# Patient Record
Sex: Female | Born: 1940
Health system: Southern US, Community
[De-identification: ages and names within clinical notes are randomized; demographics above are authoritative.]

## PROBLEM LIST (undated history)

## (undated) DIAGNOSIS — R0600 Dyspnea, unspecified: Secondary | ICD-10-CM

## (undated) DIAGNOSIS — D649 Anemia, unspecified: Secondary | ICD-10-CM

## (undated) DIAGNOSIS — I480 Paroxysmal atrial fibrillation: Secondary | ICD-10-CM

## (undated) DIAGNOSIS — I10 Essential (primary) hypertension: Secondary | ICD-10-CM

## (undated) DIAGNOSIS — N189 Chronic kidney disease, unspecified: Secondary | ICD-10-CM

## (undated) DIAGNOSIS — F419 Anxiety disorder, unspecified: Secondary | ICD-10-CM

## (undated) DIAGNOSIS — I2510 Atherosclerotic heart disease of native coronary artery without angina pectoris: Secondary | ICD-10-CM

## (undated) DIAGNOSIS — F410 Panic disorder [episodic paroxysmal anxiety] without agoraphobia: Secondary | ICD-10-CM

## (undated) DIAGNOSIS — I059 Rheumatic mitral valve disease, unspecified: Secondary | ICD-10-CM

## (undated) DIAGNOSIS — M1990 Unspecified osteoarthritis, unspecified site: Secondary | ICD-10-CM

## (undated) DIAGNOSIS — Z87442 Personal history of urinary calculi: Secondary | ICD-10-CM

## (undated) DIAGNOSIS — I2699 Other pulmonary embolism without acute cor pulmonale: Secondary | ICD-10-CM

## (undated) DIAGNOSIS — R112 Nausea with vomiting, unspecified: Secondary | ICD-10-CM

## (undated) DIAGNOSIS — Z950 Presence of cardiac pacemaker: Secondary | ICD-10-CM

## (undated) DIAGNOSIS — Z9889 Other specified postprocedural states: Secondary | ICD-10-CM

## (undated) DIAGNOSIS — J189 Pneumonia, unspecified organism: Secondary | ICD-10-CM

## (undated) DIAGNOSIS — Z923 Personal history of irradiation: Secondary | ICD-10-CM

## (undated) DIAGNOSIS — F32A Depression, unspecified: Secondary | ICD-10-CM

## (undated) DIAGNOSIS — K219 Gastro-esophageal reflux disease without esophagitis: Secondary | ICD-10-CM

## (undated) DIAGNOSIS — E782 Mixed hyperlipidemia: Secondary | ICD-10-CM

## (undated) DIAGNOSIS — J449 Chronic obstructive pulmonary disease, unspecified: Secondary | ICD-10-CM

## (undated) DIAGNOSIS — G4730 Sleep apnea, unspecified: Secondary | ICD-10-CM

## (undated) HISTORY — DX: Gastro-esophageal reflux disease without esophagitis: K21.9

## (undated) HISTORY — DX: Paroxysmal atrial fibrillation: I48.0

## (undated) HISTORY — PX: BIOPSY BREAST: PRO8

## (undated) HISTORY — DX: Unspecified osteoarthritis, unspecified site: M19.90

## (undated) HISTORY — PX: COLONOSCOPY: SHX174

## (undated) HISTORY — PX: CARDIAC VALVE SURGERY: SHX40

## (undated) HISTORY — PX: MULTIPLE TOOTH EXTRACTIONS: SHX2053

## (undated) HISTORY — DX: Rheumatic mitral valve disease, unspecified: I05.9

## (undated) HISTORY — DX: Panic disorder (episodic paroxysmal anxiety): F41.0

## (undated) HISTORY — DX: Other pulmonary embolism without acute cor pulmonale: I26.99

## (undated) HISTORY — PX: EYE SURGERY: SHX253

## (undated) HISTORY — DX: Chronic obstructive pulmonary disease, unspecified: J44.9

## (undated) HISTORY — DX: Atherosclerotic heart disease of native coronary artery without angina pectoris: I25.10

## (undated) HISTORY — DX: Anemia, unspecified: D64.9

## (undated) HISTORY — DX: Mixed hyperlipidemia: E78.2

## (undated) HISTORY — PX: TUBAL LIGATION: SHX77

## (undated) HISTORY — DX: Anxiety disorder, unspecified: F41.9

## (undated) HISTORY — DX: Essential (primary) hypertension: I10

---

## 1999-06-13 ENCOUNTER — Other Ambulatory Visit: Admission: RE | Admit: 1999-06-13 | Discharge: 1999-06-13 | Payer: Self-pay | Admitting: Family Medicine

## 2000-08-12 ENCOUNTER — Encounter: Admission: RE | Admit: 2000-08-12 | Discharge: 2000-08-12 | Payer: Self-pay | Admitting: Family Medicine

## 2000-08-12 ENCOUNTER — Other Ambulatory Visit: Admission: RE | Admit: 2000-08-12 | Discharge: 2000-08-12 | Payer: Self-pay | Admitting: Family Medicine

## 2000-08-12 ENCOUNTER — Encounter: Payer: Self-pay | Admitting: Family Medicine

## 2001-02-19 HISTORY — PX: OTHER SURGICAL HISTORY: SHX169

## 2001-10-07 ENCOUNTER — Other Ambulatory Visit: Admission: RE | Admit: 2001-10-07 | Discharge: 2001-10-07 | Payer: Self-pay | Admitting: Family Medicine

## 2001-12-30 ENCOUNTER — Ambulatory Visit (HOSPITAL_BASED_OUTPATIENT_CLINIC_OR_DEPARTMENT_OTHER): Admission: RE | Admit: 2001-12-30 | Discharge: 2001-12-30 | Payer: Self-pay | Admitting: Orthopedic Surgery

## 2002-10-30 ENCOUNTER — Other Ambulatory Visit: Admission: RE | Admit: 2002-10-30 | Discharge: 2002-10-30 | Payer: Self-pay | Admitting: Family Medicine

## 2003-11-12 ENCOUNTER — Other Ambulatory Visit: Admission: RE | Admit: 2003-11-12 | Discharge: 2003-11-12 | Payer: Self-pay | Admitting: Family Medicine

## 2003-11-16 ENCOUNTER — Encounter: Admission: RE | Admit: 2003-11-16 | Discharge: 2004-02-14 | Payer: Self-pay | Admitting: Family Medicine

## 2004-02-22 ENCOUNTER — Encounter (INDEPENDENT_AMBULATORY_CARE_PROVIDER_SITE_OTHER): Payer: Self-pay | Admitting: Specialist

## 2004-02-22 ENCOUNTER — Ambulatory Visit (HOSPITAL_COMMUNITY): Admission: RE | Admit: 2004-02-22 | Discharge: 2004-02-22 | Payer: Self-pay | Admitting: Gastroenterology

## 2005-04-14 ENCOUNTER — Inpatient Hospital Stay (HOSPITAL_COMMUNITY): Admission: EM | Admit: 2005-04-14 | Discharge: 2005-04-17 | Payer: Self-pay | Admitting: *Deleted

## 2005-04-16 ENCOUNTER — Encounter (INDEPENDENT_AMBULATORY_CARE_PROVIDER_SITE_OTHER): Payer: Self-pay | Admitting: Interventional Cardiology

## 2005-10-05 ENCOUNTER — Other Ambulatory Visit: Admission: RE | Admit: 2005-10-05 | Discharge: 2005-10-05 | Payer: Self-pay | Admitting: Family Medicine

## 2006-10-09 ENCOUNTER — Encounter: Admission: RE | Admit: 2006-10-09 | Discharge: 2006-10-09 | Payer: Self-pay | Admitting: Family Medicine

## 2006-12-03 ENCOUNTER — Other Ambulatory Visit: Admission: RE | Admit: 2006-12-03 | Discharge: 2006-12-03 | Payer: Self-pay | Admitting: Family Medicine

## 2007-12-19 ENCOUNTER — Encounter (INDEPENDENT_AMBULATORY_CARE_PROVIDER_SITE_OTHER): Payer: Self-pay | Admitting: Internal Medicine

## 2007-12-20 ENCOUNTER — Inpatient Hospital Stay (HOSPITAL_COMMUNITY): Admission: EM | Admit: 2007-12-20 | Discharge: 2007-12-22 | Payer: Self-pay | Admitting: Emergency Medicine

## 2008-02-20 DIAGNOSIS — I2699 Other pulmonary embolism without acute cor pulmonale: Secondary | ICD-10-CM

## 2008-02-20 HISTORY — DX: Other pulmonary embolism without acute cor pulmonale: I26.99

## 2008-04-16 ENCOUNTER — Inpatient Hospital Stay (HOSPITAL_COMMUNITY): Admission: EM | Admit: 2008-04-16 | Discharge: 2008-04-17 | Payer: Self-pay | Admitting: Emergency Medicine

## 2008-08-25 ENCOUNTER — Encounter: Payer: Self-pay | Admitting: Internal Medicine

## 2008-08-27 ENCOUNTER — Encounter: Payer: Self-pay | Admitting: Internal Medicine

## 2008-09-02 ENCOUNTER — Encounter: Payer: Self-pay | Admitting: Internal Medicine

## 2008-10-21 ENCOUNTER — Encounter: Payer: Self-pay | Admitting: Internal Medicine

## 2008-10-22 ENCOUNTER — Encounter: Payer: Self-pay | Admitting: Internal Medicine

## 2008-11-12 DIAGNOSIS — K219 Gastro-esophageal reflux disease without esophagitis: Secondary | ICD-10-CM | POA: Insufficient documentation

## 2008-11-12 DIAGNOSIS — D649 Anemia, unspecified: Secondary | ICD-10-CM | POA: Insufficient documentation

## 2008-11-12 DIAGNOSIS — Z86718 Personal history of other venous thrombosis and embolism: Secondary | ICD-10-CM | POA: Insufficient documentation

## 2008-11-12 DIAGNOSIS — M1990 Unspecified osteoarthritis, unspecified site: Secondary | ICD-10-CM | POA: Insufficient documentation

## 2008-11-12 DIAGNOSIS — I4891 Unspecified atrial fibrillation: Secondary | ICD-10-CM | POA: Insufficient documentation

## 2008-11-12 DIAGNOSIS — F411 Generalized anxiety disorder: Secondary | ICD-10-CM | POA: Insufficient documentation

## 2008-11-12 DIAGNOSIS — F410 Panic disorder [episodic paroxysmal anxiety] without agoraphobia: Secondary | ICD-10-CM | POA: Insufficient documentation

## 2008-11-12 DIAGNOSIS — I10 Essential (primary) hypertension: Secondary | ICD-10-CM | POA: Insufficient documentation

## 2008-11-12 DIAGNOSIS — E119 Type 2 diabetes mellitus without complications: Secondary | ICD-10-CM | POA: Insufficient documentation

## 2008-11-12 DIAGNOSIS — I059 Rheumatic mitral valve disease, unspecified: Secondary | ICD-10-CM | POA: Insufficient documentation

## 2008-11-12 DIAGNOSIS — J449 Chronic obstructive pulmonary disease, unspecified: Secondary | ICD-10-CM | POA: Insufficient documentation

## 2008-11-12 DIAGNOSIS — I2510 Atherosclerotic heart disease of native coronary artery without angina pectoris: Secondary | ICD-10-CM | POA: Insufficient documentation

## 2008-11-12 DIAGNOSIS — E782 Mixed hyperlipidemia: Secondary | ICD-10-CM | POA: Insufficient documentation

## 2008-11-15 ENCOUNTER — Ambulatory Visit: Payer: Self-pay | Admitting: Internal Medicine

## 2008-11-16 DIAGNOSIS — I5022 Chronic systolic (congestive) heart failure: Secondary | ICD-10-CM | POA: Insufficient documentation

## 2008-11-19 ENCOUNTER — Encounter: Payer: Self-pay | Admitting: Internal Medicine

## 2008-11-19 ENCOUNTER — Ambulatory Visit (HOSPITAL_COMMUNITY): Admission: RE | Admit: 2008-11-19 | Discharge: 2008-11-19 | Payer: Self-pay | Admitting: Internal Medicine

## 2008-11-19 ENCOUNTER — Ambulatory Visit: Payer: Self-pay

## 2008-11-19 ENCOUNTER — Ambulatory Visit: Payer: Self-pay | Admitting: Internal Medicine

## 2009-01-24 ENCOUNTER — Ambulatory Visit: Payer: Self-pay | Admitting: Internal Medicine

## 2009-02-17 ENCOUNTER — Telehealth: Payer: Self-pay | Admitting: Internal Medicine

## 2009-02-28 ENCOUNTER — Telehealth: Payer: Self-pay | Admitting: Internal Medicine

## 2009-03-01 ENCOUNTER — Encounter: Payer: Self-pay | Admitting: Internal Medicine

## 2009-03-02 ENCOUNTER — Encounter: Payer: Self-pay | Admitting: Internal Medicine

## 2009-03-10 ENCOUNTER — Ambulatory Visit: Payer: Self-pay | Admitting: Internal Medicine

## 2009-03-16 LAB — CONVERTED CEMR LAB
CO2: 28 meq/L (ref 19–32)
Calcium: 9.7 mg/dL (ref 8.4–10.5)
Chloride: 100 meq/L (ref 96–112)
Creatinine, Ser: 0.8 mg/dL (ref 0.4–1.2)
Eosinophils Absolute: 0.2 10*3/uL (ref 0.0–0.7)
Eosinophils Relative: 2.6 % (ref 0.0–5.0)
GFR calc non Af Amer: 75.33 mL/min (ref >60)
HCT: 36.8 % (ref 36.0–46.0)
INR: 1.7 — ABNORMAL HIGH (ref 0.8–1.0)
MCHC: 32.6 g/dL (ref 30.0–36.0)
MCV: 90 fL (ref 78.0–100.0)
Monocytes Relative: 8.3 % (ref 3.0–12.0)
Platelets: 207 10*3/uL (ref 150.0–400.0)
Potassium: 4 meq/L (ref 3.5–5.1)
Prothrombin Time: 17.7 s — ABNORMAL HIGH (ref 9.1–11.7)
RDW: 13.1 % (ref 11.5–14.6)
Sodium: 137 meq/L (ref 135–145)

## 2009-03-17 ENCOUNTER — Ambulatory Visit: Payer: Self-pay | Admitting: Internal Medicine

## 2009-03-17 ENCOUNTER — Inpatient Hospital Stay (HOSPITAL_COMMUNITY): Admission: RE | Admit: 2009-03-17 | Discharge: 2009-03-18 | Payer: Self-pay | Admitting: Internal Medicine

## 2009-03-18 ENCOUNTER — Encounter: Payer: Self-pay | Admitting: Internal Medicine

## 2009-03-28 ENCOUNTER — Telehealth (INDEPENDENT_AMBULATORY_CARE_PROVIDER_SITE_OTHER): Payer: Self-pay | Admitting: *Deleted

## 2009-03-31 ENCOUNTER — Encounter: Payer: Self-pay | Admitting: Internal Medicine

## 2009-03-31 ENCOUNTER — Ambulatory Visit: Payer: Self-pay

## 2009-06-28 ENCOUNTER — Encounter: Payer: Self-pay | Admitting: Internal Medicine

## 2009-06-28 ENCOUNTER — Ambulatory Visit: Payer: Self-pay | Admitting: Internal Medicine

## 2009-08-24 ENCOUNTER — Telehealth: Payer: Self-pay | Admitting: Internal Medicine

## 2009-09-28 ENCOUNTER — Encounter: Payer: Self-pay | Admitting: Internal Medicine

## 2009-09-29 ENCOUNTER — Ambulatory Visit: Payer: Self-pay | Admitting: Internal Medicine

## 2009-10-03 ENCOUNTER — Telehealth: Payer: Self-pay | Admitting: Internal Medicine

## 2009-10-10 ENCOUNTER — Ambulatory Visit: Payer: Self-pay | Admitting: Internal Medicine

## 2009-10-10 DIAGNOSIS — R42 Dizziness and giddiness: Secondary | ICD-10-CM | POA: Insufficient documentation

## 2009-10-18 ENCOUNTER — Encounter: Admission: RE | Admit: 2009-10-18 | Discharge: 2009-10-18 | Payer: Self-pay | Admitting: Gastroenterology

## 2009-10-28 ENCOUNTER — Encounter: Payer: Self-pay | Admitting: Internal Medicine

## 2010-01-05 ENCOUNTER — Ambulatory Visit: Payer: Self-pay | Admitting: Internal Medicine

## 2010-02-02 ENCOUNTER — Encounter (INDEPENDENT_AMBULATORY_CARE_PROVIDER_SITE_OTHER): Payer: Self-pay | Admitting: *Deleted

## 2010-03-21 NOTE — Progress Notes (Signed)
Summary: set up ICD placement  0Phone Note Call from Patient Call back at Home Phone [phone]   Caller: Patient Reason for Call: Talk to Nurse, Talk to Doctor Summary of Call: pt calling to schedule her ICD placement Initial call taken by: Omer Jack,  February 28, 2009 3:24 PM  Follow-up for Phone Call        [facility] for pt to call back to sch Bi-V ICD Dennis Bast, RN, BSN  March 01, 2009 10:41 AM procedure scheduled for 03/17/09 at 7:30am labs on 03/10/09 at 3pm Dennis Bast, RN, BSN  March 02, 2009 3:59 PM

## 2010-03-21 NOTE — Miscellaneous (Signed)
Summary: Device preload  Clinical Lists Changes  Observations: Added new observation of ICDLEADSTAT3: active (03/18/2009 11:18) Added new observation of ICDLEADSER3: [id number] (03/18/2009 11:18) Added new observation of ICDLEADMOD3: 1258T (03/18/2009 11:18) Added new observation of ICDLEADLOC3: LV (03/18/2009 11:18) Added new observation of ICDLEADSTAT2: active (03/18/2009 11:18) Added new observation of ICDLEADSER2: [id number] (03/18/2009 11:18) Added new observation of ICDLEADMOD2: 7122  (03/18/2009 11:18) Added new observation of ICDLEADLOC2: RV  (03/18/2009 11:18) Added new observation of ICDLEADSTAT1: active  (03/18/2009 11:18) Added new observation of ICDLEADSER1: [id number]  (03/18/2009 11:18) Added new observation of ICDLEADMOD1: 2088TC  (03/18/2009 11:18) Added new observation of ICDLEADLOC1: RA  (03/18/2009 11:18) Added new observation of ICD IMP MD: Lewayne Bunting, MD  (03/18/2009 11:18) Added new observation of ICDLEADDOI3: 03/17/2009  (03/18/2009 11:18) Added new observation of ICDLEADDOI2: 03/17/2009  (03/18/2009 11:18) Added new observation of ICDLEADDOI1: 03/17/2009  (03/18/2009 11:18) Added new observation of ICD IMPL DTE: 03/17/2009  (03/18/2009 11:18) Added new observation of ICD SERL#: [id number]  (03/18/2009 11:18) Added new observation of ICD MODL#: [id number]  (03/18/2009 85:46) Added new observation of ICDMANUFACTR: [employer]  (03/18/2009 11:18) Added new observation of ICD MD: Lewayne Bunting, MD  (03/18/2009 11:18)       ICD Specifications Following MD:  Lewayne Bunting, MD     ICD Vendor:  [employer] Jude     ICD Model Number:  [id number]     ICD Serial Number:  [id number] ICD DOI:  03/17/2009     ICD Implanting MD:  Lewayne Bunting, MD  Lead 1:    Location: RA     DOI: 03/17/2009     Model #: [id number]     Serial #: [id number]     Status: active Lead 2:    Location: RV     DOI: 03/17/2009     Model #: [id number]     Serial #: [id number]     Status: active Lead 3:    Location: LV     DOI: 03/17/2009      Model #: 8242P     Serial #: [id number]     Status: active

## 2010-03-21 NOTE — Cardiovascular Report (Signed)
Summary: Office Visit   Office Visit   Imported By: Roderic Ovens 04/18/2009 13:10:01  _____________________________________________________________________  External Attachment:    Type:   Image     Comment:   External Document

## 2010-03-21 NOTE — Letter (Signed)
Summary: Physician Query Form  Physician Query Form   Imported By: Roderic Ovens 06/20/2009 16:48:47  _____________________________________________________________________  External Attachment:    Type:   Image     Comment:   External Document

## 2010-03-21 NOTE — Assessment & Plan Note (Signed)
Summary: [facility]   Primary Provider:  [name] MD   History of Present Illness: Emily Abbott returns today for followup.  She has a h/o symptomatic tachybrady, CHF, s/p MVR and is s/p BiV ICD secondary to all of the above and LBBB.  She denies c/p.  She has rare episodes of dyspnea.  She has had no intercurrent ICD therapies.  She does have some peripheral edema.  Current Medications (verified): 1)  Metformin Hcl 850 Mg Tabs (Metformin Hcl) .... One Tablet By Mouth Two Times A Day 2)  Aspirin Ec 325 Mg Tbec (Aspirin) .... Take One Tablet By Mouth Daily 3)  Carvedilol 12.5 Mg Tabs (Carvedilol) .... Take One Tablet By Mouth Twice A Day 4)  Tikosyn 250 Mcg Caps (Dofetilide) .... One Capsule By Mouth Two Times A Day 5)  Advair Diskus 100-50 Mcg/dose Aepb (Fluticasone-Salmeterol) .Marland Kitchen.. 1 Puff Two Times A Day 6)  Niaspan 750 Mg Cr-Tabs (Niacin (Antihyperlipidemic)) .... One Tablet Two Times A Day 7)  Prilosec 40 Mg Cpdr (Omeprazole) .... One Capsule By Mouth Once Daily 8)  Paroxetine Hcl 30 Mg Tabs (Paroxetine Hcl) .... One Tablet By Mouth Once Daily 9)  Simvastatin 40 Mg Tabs (Simvastatin) .... Take One Tablet By Mouth Daily At Bedtime 10)  Spiriva Handihaler 18 Mcg Caps (Tiotropium Bromide Monohydrate) .... Once Daily 11)  Warfarin Sodium 5 Mg Tabs (Warfarin Sodium) .... Use As Directed By Bertram Gala, Md 12)  Vicodin 5-500 Mg Tabs (Hydrocodone-Acetaminophen) .... As Needed 13)  Proair Hfa 108 (90 Base) Mcg/act Aers (Albuterol Sulfate) .... Uad As Needed 14)  Alprazolam 0.5 Mg Tabs (Alprazolam) .... One Tablet Two Times A Day As Needed For Anxiety 15)  Zolpidem Tartrate 10 Mg Tabs (Zolpidem Tartrate) .... One Tablet At Bed Time. 16)  Diphenhist 25 Mg Caps (Diphenhydramine Hcl) .... At Bedtime 17)  Tylenol Pm Extra Strength 500-25 Mg Tabs (Diphenhydramine-Apap (Sleep)) .... As Needed 18)  Simvastatin 20 Mg Tabs (Simvastatin) .... Take One Tablet By Mouth Daily At Bedtime 19)  Zyrtec  Hives Relief 10 Mg Tabs (Cetirizine Hcl) .... Once Daily  Allergies (verified): 1)  ! * Sulfa  Past History:  Past Medical History: Last updated: 11/12/2008 Current Problems:  PAROXYSMAL ATRIAL FIBRILLATION (ICD-427.31) CAD (ICD-414.00) ESSENTIAL HYPERTENSION (ICD-401.9) HYPERLIPIDEMIA, MIXED (ICD-272.2) DIABETES MELLITUS, TYPE II (ICD-250.00) MITRAL VALVE DISORDER (ICD-424.0) GERD (ICD-530.81) PULMONARY EMBOLISM, HX OF (ICD-V12.51) ANEMIA (ICD-285.9) COPD (ICD-496) OSTEOARTHRITIS (ICD-715.90) ANXIETY (ICD-300.00) PANIC ATTACK (ICD-300.01)  Past Surgical History: Last updated: 11/12/2008 BTL Breast BX Carpal tunnel 2003 Heart valve  Review of Systems  The patient denies chest pain, syncope, dyspnea on exertion, and peripheral edema.    Vital Signs:  Patient profile:   70 year old female Height:      66 inches Weight:      218 pounds BMI:     35.31 Pulse rate:   66 / minute BP sitting:   120 / 86  (left arm)  Vitals Entered By: Laurance Flatten CMA (Jun 28, 2009 2:42 PM)  Physical Exam  General:  Well developed, well nourished, in no acute distress. Head:  normocephalic and atraumatic Eyes:  PERRLA/EOM intact; conjunctiva and lids normal. Mouth:  Teeth, gums and palate normal. Oral mucosa normal. Neck:  Neck supple, no JVD. No masses, thyromegaly or abnormal cervical nodes. Chest Wall:  Healing median sternotomy incision. Well healed ICD incision. Lungs:  Clear bilaterally except for basilar rales in the left base. Heart:  RRR with normal S1 and S2.  PMI is  enlarged and laterally displaced.  No murmurs. Abdomen:  Bowel sounds positive; abdomen soft and non-tender without masses, organomegaly, or hernias noted. No hepatosplenomegaly. Msk:  Back normal, normal gait. Muscle strength and tone normal. Pulses:  pulses normal in all 4 extremities Extremities:  No clubbing or cyanosis. No peripheral edema. Neurologic:  Alert and oriented x 3.    ICD  Specifications Following MD:  Lewayne Bunting, MD     ICD Vendor:  [facility] Jude     ICD Model Number:  [id number]     ICD Serial Number:  [id number] ICD DOI:  03/17/2009     ICD Implanting MD:  Lewayne Bunting, MD  Lead 1:    Location: RA     DOI: 03/17/2009     Model #: [id number]     Serial #: [id number]     Status: active Lead 2:    Location: RV     DOI: 03/17/2009     Model #: [id number]     Serial #: [id number]     Status: active Lead 3:    Location: LV     DOI: 03/17/2009     Model #: 1258T     Serial #: [id number]     Status: active  ICD Follow Up Charge Time:  7.9 seconds     Battery Est. Longevity:  5.8-7.2 YRS Underlying rhythm:  SR ICD Dependent:  No       ICD Device Measurements Atrium:  Amplitude: 4.4 mV, Impedance: 390 ohms, Threshold: 0.75 V at 0.5 msec Right Ventricle:  Amplitude: 12.0 mV, Impedance: 480 ohms, Threshold: 0.625 V at 0.5 msec Left Ventricle:  Impedance: 860 ohms, Threshold: 0.5 V at 0.5 msec Shock Impedance: 61 ohms   Episodes MS Episodes:  43     Percent Mode Switch:  <1%     Coumadin:  No Shock:  0     ATP:  0     Nonsustained:  0     Atrial Therapies:  0 Atrial Pacing:  11%     Ventricular Pacing:  99%  Brady Parameters Mode DDD     Lower Rate Limit:  60     Upper Rate Limit 120 PAV 160     Sensed AV Delay:  110  Tachy Zones VF:  187     Next Cardiology Appt Due:  09/19/2009 Tech Comments:  NORMAL DEVICE FUNCTION. CHANGED ATRIAL AMPLITUDE FROM 3.5 TO 2.0V.  LONGEST MODE SWITCH 57 MINUTES ON 06-18-09. ROV IN CLINIC 3 [facility].  Vella Kohler  Jun 28, 2009 2:53 PM MD Comments:  Agree with above.  Impression & Recommendations:  Problem # 1:  CHRONIC SYSTOLIC HEART FAILURE (ICD-428.22) Her symptoms are class 2 and well controlled.  Continue current meds. Her updated medication list for this problem includes:    Aspirin Ec 325 Mg Tbec (Aspirin) .Marland Kitchen... Take one tablet by mouth daily    Carvedilol 12.5 Mg Tabs (Carvedilol) .Marland Kitchen... Take one tablet by mouth twice a day    Tikosyn 250 Mcg  Caps (Dofetilide) ..... One capsule by mouth two times a day    Warfarin Sodium 5 Mg Tabs (Warfarin sodium) ..... Use as directed by Bertram Gala, md  Problem # 2:  PAROXYSMAL ATRIAL FIBRILLATION (ICD-427.31) She has maintained NSR very nicely on tikosyn.  Continue current meds. Her updated medication list for this problem includes:    Aspirin Ec 325 Mg Tbec (Aspirin) .Marland Kitchen... Take one tablet by mouth daily    Carvedilol 12.5 Mg Tabs (Carvedilol) .Marland KitchenMarland KitchenMarland KitchenMarland Kitchen  Take one tablet by mouth twice a day    Tikosyn 250 Mcg Caps (Dofetilide) ..... One capsule by mouth two times a day    Warfarin Sodium 5 Mg Tabs (Warfarin sodium) ..... Use as directed by Bertram Gala, md  Problem # 3:  CAD (ICD-414.00) No anginal symptoms.   Her updated medication list for this problem includes:    Aspirin Ec 325 Mg Tbec (Aspirin) .Marland Kitchen... Take one tablet by mouth daily    Carvedilol 12.5 Mg Tabs (Carvedilol) .Marland Kitchen... Take one tablet by mouth twice a day    Warfarin Sodium 5 Mg Tabs (Warfarin sodium) ..... Use as directed by Bertram Gala, md  Patient Instructions: 1)  Your physician recommends that you schedule a follow-up appointment in: Jan.2012

## 2010-03-21 NOTE — Cardiovascular Report (Signed)
Summary: Office Visit   Office Visit   Imported By: Roderic Ovens 07/05/2009 11:57:07  _____________________________________________________________________  External Attachment:    Type:   Image     Comment:   External Document

## 2010-03-21 NOTE — Cardiovascular Report (Signed)
Summary: Office Visit Remote   Office Visit Remote   Imported By: Roderic Ovens 11/01/2009 10:05:30  _____________________________________________________________________  External Attachment:    Type:   Image     Comment:   External Document

## 2010-03-21 NOTE — Progress Notes (Signed)
Summary: PT REQUEST CALL--gt fyi  Phone Note Call from Patient Call back at [phone]   Caller: Patient Summary of Call: PT REQUEST CALL Initial call taken by: Judie Grieve,  October 03, 2009 4:22 PM  Follow-up for Phone Call        Dr Cliffton Asters wanted her to ler Dr Ladona Ridgel know that she reduced her Coreg by 1/2 and the dizziness is much better Dennis Bast, RN, BSN  October 03, 2009 4:49 PM

## 2010-03-21 NOTE — Letter (Signed)
Summary: Remote Device Check  [employer], Main Office  [address]   [place], [place] [address]   Phone: [phone]  Fax: [phone]     October 28, 2009 MRN: [id number]   [facility] [address] [place], [place]  [address]   Dear Ms. Schwartz,   Your remote transmission was recieved and reviewed by your physician.  All diagnostics were within normal limits for you.  __X___Your next transmission is scheduled for:   01-05-2010.  Please transmit at any time this day.  If you have a wireless device your transmission will be sent automatically.  Sincerely,  Vella Kohler

## 2010-03-21 NOTE — Progress Notes (Signed)
  Phone Note From Other Clinic   Caller: Annabelle Harman Details for Reason: Pt.Information Initial call taken by: Marijean Niemann Labs over to [facility] to fax [phone] [facility]  March 28, 2009 3:53 PM

## 2010-03-21 NOTE — Procedures (Signed)
Summary: [facility]   Current Medications (verified): 1)  Metformin Hcl 850 Mg Tabs (Metformin Hcl) .... One Tablet By Mouth Two Times A Day 2)  Aspirin Ec 325 Mg Tbec (Aspirin) .... Take One Tablet By Mouth Daily 3)  Carvedilol 12.5 Mg Tabs (Carvedilol) .... Take One Tablet By Mouth Twice A Day 4)  Tikosyn 250 Mcg Caps (Dofetilide) .... One Capsule By Mouth Two Times A Day 5)  Advair Diskus 100-50 Mcg/dose Aepb (Fluticasone-Salmeterol) .Marland Kitchen.. 1 Puff Two Times A Day 6)  Niaspan 750 Mg Cr-Tabs (Niacin (Antihyperlipidemic)) .... One Tablet Two Times A Day 7)  Prilosec 40 Mg Cpdr (Omeprazole) .... One Capsule By Mouth Once Daily 8)  Paroxetine Hcl 30 Mg Tabs (Paroxetine Hcl) .... One Tablet By Mouth Once Daily 9)  Simvastatin 40 Mg Tabs (Simvastatin) .... Take One Tablet By Mouth Daily At Bedtime 10)  Spiriva Handihaler 18 Mcg Caps (Tiotropium Bromide Monohydrate) .... Once Daily 11)  Warfarin Sodium 5 Mg Tabs (Warfarin Sodium) .... Use As Directed By Bertram Gala, Md 12)  Percocet 5-325 Mg Tabs (Oxycodone-Acetaminophen) .... As Needed/ Out 13)  Proair Hfa 108 [phone] Base) Mcg/act Aers (Albuterol Sulfate) .... Uad As Needed 14)  Alprazolam 0.5 Mg Tabs (Alprazolam) .... One Tablet Two Times A Day As Needed For Anxiety 15)  Zolpidem Tartrate 10 Mg Tabs (Zolpidem Tartrate) .... One Tablet At Bed Time. 16)  Diphenhist 25 Mg Caps (Diphenhydramine Hcl) .... As Needed 17)  Tylenol Pm Extra Strength 500-25 Mg Tabs (Diphenhydramine-Apap (Sleep)) .... As Needed 18)  Amoxicillin 500 Mg Caps (Amoxicillin) .... 2 By Mouth Two Times A Day  Allergies (verified): 1)  ! * Sulfa   ICD Specifications Following MD:  Lewayne Bunting, MD     ICD Vendor:  [employer]     ICD Model Number:  [id number]     ICD Serial Number:  [id number] ICD DOI:  03/17/2009     ICD Implanting MD:  Lewayne Bunting, MD  Lead 1:    Location: RA     DOI: 03/17/2009     Model #: [id number]     Serial #: [id number]     Status: active Lead 2:    Location: RV     DOI:  03/17/2009     Model #: [id number]     Serial #: [id number]     Status: active Lead 3:    Location: LV     DOI: 03/17/2009     Model #: [id number]     Serial #: [id number]     Status: active  ICD Follow Up Remote Check?  No Charge Time:  7.9 seconds     Battery Est. Longevity:  6.1 years Underlying rhythm:  SR ICD Dependent:  No       ICD Device Measurements Atrium:  Amplitude: 2.5 mV, Impedance: 430 ohms, Threshold: 0.75 V at 0.5 msec Right Ventricle:  Amplitude: 11.9 mV, Impedance: 480 ohms, Threshold: 0.5 V at 0.5 msec Left Ventricle:  Impedance: 850 ohms, Threshold: 0.5 V at 0.5 msec  Episodes MS Episodes:  0     Percent Mode Switch:  0     Coumadin:  No Shock:  0     ATP:  0     Nonsustained:  0     Atrial Pacing:  8.8%     Ventricular Pacing:  100%  Brady Parameters Mode DDD     Lower Rate Limit:  60     Upper Rate Limit 120 PAV 160  Sensed AV Delay:  110  Tachy Zones VF:  187     Next Cardiology Appt Due:  06/19/2009 Tech Comments:  Steri strips removed by the patient, no redness or edema.  Quick opt done, AV reprogrammed 160/110, LV>RV, interventricular pace delay .  Activity restrictions and post shock instructions reviewed with the patient.  She understands and is agreeable.  ROV 3 months with Dr. Ladona Ridgel. Altha Harm, LPN  March 31, 2009 12:42 PM

## 2010-03-21 NOTE — Letter (Signed)
Summary: Implantable Device Instructions  [employer], Main Office  [address]   [place], [place] [address]   Phone: [phone]  Fax: [phone]      Implantable Device Instructions  You are scheduled for:  Bi-V ICD Implant  on 03/17/09  with Dr. Ladona Ridgel.  1.  Please arrive at the Short Stay Center at [facility] at 6:00am on the day of your procedure.  2.  Do not eat or drink after midnight the night before your procedure.  3.  Complete lab work on 03/10/09 at 3pm.  The lab at [facility] is open from 8:30 AM to 1:30 PM and from 2:30 PM to 5:00 PM.    4.  Do NOT take these medications for the  day of  your procedure:  Metformin, Glimepiride  Take your last dose of Coumadin on --will call if you ned to hold  5.  Plan for an overnight stay.  Bring your insurance cards and a list of your medications.  6.  Wash your chest and neck with antibacterial soap (any brand) the evening before and the morning of your procedure.  Rinse well.   *If you have ANY questions after you get home, please call the office [phone]. Emily Abbott  *Every attempt is made to prevent procedures from being rescheduled.  Due to the nauture of Electrophysiology, rescheduling can happen.  The physician is always aware and directs the staff when this occurs.

## 2010-03-21 NOTE — Progress Notes (Signed)
Summary: phone checks  Phone Note Outgoing Call Call back at Home Phone [phone] Call back at [phone]   Caller: Patient Summary of Call: pt has a landline now and wants to do the phone checks. please call pt Initial call taken by: Edman Circle,  August 24, 2009 2:26 PM Summary of Call: spoke w/pt and aware that she has been enrolled in [facility].  Transmitter should arrive 1-2 wks.  pt aware to send transmission 09-29-2009. Vella Kohler  August 24, 2009 4:06 PM

## 2010-03-21 NOTE — Cardiovascular Report (Signed)
Summary: Pre Op Orders  Pre Op Orders   Imported By: Roderic Ovens 03/29/2009 12:32:58  _____________________________________________________________________  External Attachment:    Type:   Image     Comment:   External Document

## 2010-03-21 NOTE — Assessment & Plan Note (Signed)
Summary: bp low,lightheaded,dizzy   Visit Type:  rov Primary Provider:  [name] MD  CC:  pt states that Dr. [name] did orthostatics on her a few weeks ago....edema/ankles/abdomen....denies any cp or sob.  History of Present Illness: Ms. Perella returns today for followup.  She has a h/o symptomatic tachybrady, CHF, s/p MVR and is s/p BiV ICD secondary to all of the above and LBBB.  She denies c/p.  She was experiencing problems with near syncope and had her dose of coreg reduced.  The patient has been improved with fewer fluctuations in her pressure.  She has not had any intercurrent ICD therapies.  Despite medication adjustments she does still have spells of dizziness associated with upright posture.  Current Medications (verified): 1)  Metformin Hcl 850 Mg Tabs (Metformin Hcl) .... One Tablet By Mouth Two Times A Day 2)  Aspirin Ec 325 Mg Tbec (Aspirin) .... Take One Tablet By Mouth Daily 3)  Carvedilol 12.5 Mg Tabs (Carvedilol) .... 1/2 Tab Two Times A Day 4)  Tikosyn 250 Mcg Caps (Dofetilide) .... One Capsule By Mouth Two Times A Day 5)  Advair Diskus 100-50 Mcg/dose Aepb (Fluticasone-Salmeterol) .Marland Kitchen.. 1 Puff Two Times A Day 6)  Niaspan 750 Mg Cr-Tabs (Niacin (Antihyperlipidemic)) .... One Tablet Two Times A Day 7)  Prilosec 40 Mg Cpdr (Omeprazole) .... One Capsule By Mouth Once Daily 8)  Paroxetine Hcl 30 Mg Tabs (Paroxetine Hcl) .... One Tablet By Mouth Once Daily 9)  Simvastatin 40 Mg Tabs (Simvastatin) .... Take One Tablet By Mouth Daily At Bedtime 10)  Spiriva Handihaler 18 Mcg Caps (Tiotropium Bromide Monohydrate) .... Once Daily 11)  Warfarin Sodium 5 Mg Tabs (Warfarin Sodium) .... Use As Directed By Bertram Gala, Md 12)  Vicodin 5-500 Mg Tabs (Hydrocodone-Acetaminophen) .... As Needed 13)  Proair Hfa 108 (90 Base) Mcg/act Aers (Albuterol Sulfate) .... Uad As Needed 14)  Alprazolam 0.5 Mg Tabs (Alprazolam) .... One Tablet Two Times A Day As Needed For Anxiety 15)   Diphenhist 25 Mg Caps (Diphenhydramine Hcl) .... At Bedtime 16)  Tylenol Pm Extra Strength 500-25 Mg Tabs (Diphenhydramine-Apap (Sleep)) .... As Needed 17)  Zyrtec Hives Relief 10 Mg Tabs (Cetirizine Hcl) .... Once Daily 18)  Trazodone Hcl 50 Mg Tabs (Trazodone Hcl) .Marland Kitchen.. 1 Tab At Bedtime 19)  Vitamin D3 1000 Unit Tabs (Cholecalciferol) .Marland Kitchen.. 1 Tab Once Daily  Allergies: 1)  ! * Sulfa  Past History:  Past Medical History: Last updated: 11/12/2008 Current Problems:  PAROXYSMAL ATRIAL FIBRILLATION (ICD-427.31) CAD (ICD-414.00) ESSENTIAL HYPERTENSION (ICD-401.9) HYPERLIPIDEMIA, MIXED (ICD-272.2) DIABETES MELLITUS, TYPE II (ICD-250.00) MITRAL VALVE DISORDER (ICD-424.0) GERD (ICD-530.81) PULMONARY EMBOLISM, HX OF (ICD-V12.51) ANEMIA (ICD-285.9) COPD (ICD-496) OSTEOARTHRITIS (ICD-715.90) ANXIETY (ICD-300.00) PANIC ATTACK (ICD-300.01)  Past Surgical History: Last updated: 11/12/2008 BTL Breast BX Carpal tunnel 2003 Heart valve  Family History: Last updated: 11/15/2008 Non-contributory.  Social History: Last updated: 11/15/2008 Widowed, and a former smoker. Denies ETOH abuse.  Review of Systems  The patient denies chest pain, syncope, dyspnea on exertion, and peripheral edema.    Vital Signs:  Patient profile:   70 year old female Height:      66 inches Weight:      214.4 pounds BMI:     34.73 Pulse rate:   66 / minute Pulse (ortho):   75 / minute Pulse rhythm:   irregular BP sitting:   170 / 90  (left arm) BP standing:   119 / 75 Cuff size:   regular  Vitals Entered By:  Danielle Rankin, CMA (October 10, 2009 2:49 PM)  Serial Vital Signs/Assessments:  Time      Position  BP       Pulse  Resp  Temp     By 3:00 PM   Lying LA  170/90   66                    Danielle Rankin, CMA 3:00 PM   Sitting   157/85   [address], [place] 3:02 PM   Standing  119/75   75                    Danielle Rankin, [place] 3:04 PM   Standing  145/83   80                    Danielle Rankin, [place] 3:06 PM   Standing  145/83   73                    Danielle Rankin, [place]  Comments: 3:00 PM no sxms By: Danielle Rankin, CMA  3:00 PM no sxms By: Danielle Rankin, CMA  3:02 PM no sxms By: Danielle Rankin, CMA  3:04 PM headache By: Danielle Rankin, CMA  3:06 PM no sxms By: Danielle Rankin, CMA    Physical Exam  General:  Well developed, well nourished, in no acute distress. Head:  normocephalic and atraumatic Eyes:  PERRLA/EOM intact; conjunctiva and lids normal. Mouth:  Teeth, gums and palate normal. Oral mucosa normal. Neck:  Neck supple, no JVD. No masses, thyromegaly or abnormal cervical nodes. Chest Wall:  Healing median sternotomy incision. Well healed ICD incision. Lungs:  Clear bilaterally except for basilar rales in the left base. Heart:  RRR with normal S1 and S2.  PMI is enlarged and laterally displaced.  No murmurs. Abdomen:  Bowel sounds positive; abdomen soft and non-tender without masses, organomegaly, or hernias noted. No hepatosplenomegaly. Msk:  Back normal, normal gait. Muscle strength and tone normal. Pulses:  pulses normal in all 4 extremities Extremities:  No clubbing or cyanosis. No peripheral edema. Neurologic:  Alert and oriented x 3.    ICD Specifications Following MD:  Lewayne Bunting, MD     ICD Vendor:  [facility] [employer]     ICD Model Number:  [id number]     ICD Serial Number:  [id number] ICD DOI:  03/17/2009     ICD Implanting MD:  Lewayne Bunting, MD  Lead 1:    Location: RA     DOI: 03/17/2009     Model #: [id number]     Serial #: [id number]     Status: active Lead 2:    Location: RV     DOI: 03/17/2009     Model #: [id number]     Serial #: [id number]     Status: active Lead 3:    Location: LV     DOI: 03/17/2009     Model #: [id number]     Serial #: [id number]     Status: active  ICD Follow Up ICD Dependent:  No      Episodes Coumadin:  No  Brady Parameters Mode DDD     Lower Rate Limit:  60     Upper Rate Limit 120 PAV 160     Sensed AV Delay:  110  Tachy Zones VF:  187  Impression & Recommendations:  Problem # 1:  CHRONIC SYSTOLIC HEART FAILURE (ICD-428.22) Her symptoms are well controlled despite reducing her dose of coreg. Her updated medication list for this problem includes:    Aspirin Ec 325 Mg Tbec (Aspirin) .Marland Kitchen... Take one tablet by mouth daily    Carvedilol 12.5 Mg Tabs (Carvedilol) .Marland Kitchen... 1/2 tab two times a day    Tikosyn 250 Mcg Caps (Dofetilide) ..... One capsule by mouth two times a day    Warfarin Sodium 5 Mg Tabs (Warfarin sodium) ..... Use as directed by Bertram Gala, md  Problem # 2:  ORTHOSTATIC DIZZINESS (ICD-780.4) Her symptoms have improved with reducing of her meds.  Will follow up in several months. I would be inclined to allow her blood pressure to be a bit elevated in hopes of reducing her episodes of orthostasis.  Problem # 3:  PAROXYSMAL ATRIAL FIBRILLATION (ICD-427.31) Her symptoms are well controlled.  Continue meds as below. Her updated medication list for this problem includes:    Aspirin Ec 325 Mg Tbec (Aspirin) .Marland Kitchen... Take one tablet by mouth daily    Carvedilol 12.5 Mg Tabs (Carvedilol) .Marland Kitchen... 1/2 tab two times a day    Tikosyn 250 Mcg Caps (Dofetilide) ..... One capsule by mouth two times a day    Warfarin Sodium 5 Mg Tabs (Warfarin sodium) ..... Use as directed by Bertram Gala, md  Other Orders: EKG w/ Interpretation (93000)  Patient Instructions: 1)  Your physician wants you to follow-up in: Jan 2012   You will receive a reminder letter in the mail two months in advance. If you don't receive a letter, please call our office to schedule the follow-up appointment.

## 2010-03-23 NOTE — Cardiovascular Report (Signed)
Summary: Office Visit Remote   Office Visit Remote   Imported By: Roderic Ovens 02/03/2010 09:27:02  _____________________________________________________________________  External Attachment:    Type:   Image     Comment:   External Document

## 2010-03-23 NOTE — Letter (Signed)
Summary: Remote Device Check  [facility] [employer], Main Office  [address]   [place], [place] [address]   Phone: [phone]  Fax: [phone]     February 02, 2010 MRN: [id number]   [facility] [place] [address] [place], [place]  [address]   Dear Ms. Roel,   Your remote transmission was recieved and reviewed by your physician.  All diagnostics were within normal limits for you.  ___X__Your next transmission is scheduled for:04/13/2010.  Please transmit at any time this day.  If you have a wireless device your transmission will be sent automatically.  ______Your next office visit is scheduled for:                              . Please call our office to schedule an appointment.    Sincerely,  Altha Harm, LPN

## 2010-03-28 ENCOUNTER — Other Ambulatory Visit: Payer: Self-pay | Admitting: Family Medicine

## 2010-03-28 ENCOUNTER — Other Ambulatory Visit (HOSPITAL_COMMUNITY)
Admission: RE | Admit: 2010-03-28 | Discharge: 2010-03-28 | Disposition: A | Discharge status: Home / Self Care | Payer: Medicare Other | Source: Ambulatory Visit | Attending: Family Medicine | Admitting: Family Medicine

## 2010-03-28 DIAGNOSIS — Z0142 Encounter for cervical smear to confirm findings of recent normal smear following initial abnormal smear: Secondary | ICD-10-CM | POA: Insufficient documentation

## 2010-04-13 ENCOUNTER — Encounter (INDEPENDENT_AMBULATORY_CARE_PROVIDER_SITE_OTHER): Payer: Medicare Other

## 2010-04-13 DIAGNOSIS — I428 Other cardiomyopathies: Secondary | ICD-10-CM

## 2010-04-19 ENCOUNTER — Emergency Department (HOSPITAL_COMMUNITY): Payer: Medicare Other

## 2010-04-19 ENCOUNTER — Emergency Department (HOSPITAL_COMMUNITY)
Admission: EM | Admit: 2010-04-19 | Discharge: 2010-04-19 | Disposition: A | Discharge status: Home / Self Care | Payer: Medicare Other | Attending: Emergency Medicine | Admitting: Emergency Medicine

## 2010-04-19 ENCOUNTER — Encounter: Payer: Self-pay | Admitting: Internal Medicine

## 2010-04-19 DIAGNOSIS — R609 Edema, unspecified: Secondary | ICD-10-CM | POA: Insufficient documentation

## 2010-04-19 DIAGNOSIS — E789 Disorder of lipoprotein metabolism, unspecified: Secondary | ICD-10-CM | POA: Insufficient documentation

## 2010-04-19 DIAGNOSIS — Z950 Presence of cardiac pacemaker: Secondary | ICD-10-CM | POA: Insufficient documentation

## 2010-04-19 DIAGNOSIS — S93609A Unspecified sprain of unspecified foot, initial encounter: Secondary | ICD-10-CM | POA: Insufficient documentation

## 2010-04-19 DIAGNOSIS — M79609 Pain in unspecified limb: Secondary | ICD-10-CM | POA: Insufficient documentation

## 2010-04-19 DIAGNOSIS — I10 Essential (primary) hypertension: Secondary | ICD-10-CM | POA: Insufficient documentation

## 2010-04-19 DIAGNOSIS — Y9229 Other specified public building as the place of occurrence of the external cause: Secondary | ICD-10-CM | POA: Insufficient documentation

## 2010-04-19 DIAGNOSIS — M129 Arthropathy, unspecified: Secondary | ICD-10-CM | POA: Insufficient documentation

## 2010-04-19 DIAGNOSIS — Z79899 Other long term (current) drug therapy: Secondary | ICD-10-CM | POA: Insufficient documentation

## 2010-04-19 DIAGNOSIS — X503XXA Overexertion from repetitive movements, initial encounter: Secondary | ICD-10-CM | POA: Insufficient documentation

## 2010-04-19 DIAGNOSIS — E119 Type 2 diabetes mellitus without complications: Secondary | ICD-10-CM | POA: Insufficient documentation

## 2010-04-19 DIAGNOSIS — I4891 Unspecified atrial fibrillation: Secondary | ICD-10-CM | POA: Insufficient documentation

## 2010-04-19 DIAGNOSIS — F411 Generalized anxiety disorder: Secondary | ICD-10-CM | POA: Insufficient documentation

## 2010-04-19 LAB — DIFFERENTIAL
Lymphocytes Relative: 24 % (ref 12–46)
Lymphs Abs: 3.4 10*3/uL (ref 0.7–4.0)
Monocytes Absolute: 0.9 10*3/uL (ref 0.1–1.0)

## 2010-04-19 LAB — CBC
Hemoglobin: 12.2 g/dL (ref 12.0–15.0)
MCHC: 33.5 g/dL (ref 30.0–36.0)
MCV: 86.7 fL (ref 78.0–100.0)
Platelets: 198 10*3/uL (ref 150–400)
RDW: 14.1 % (ref 11.5–15.5)
WBC: 13.9 10*3/uL — ABNORMAL HIGH (ref 4.0–10.5)

## 2010-04-19 LAB — BASIC METABOLIC PANEL
CO2: 24 mEq/L (ref 19–32)
Calcium: 9.5 mg/dL (ref 8.4–10.5)
GFR calc Af Amer: >60 mL/min (ref >60)
GFR calc non Af Amer: >60 mL/min (ref >60)
Sodium: 132 mEq/L — ABNORMAL LOW (ref 135–145)

## 2010-04-19 LAB — PROTIME-INR: Prothrombin Time: 26.5 seconds — ABNORMAL HIGH (ref 11.6–15.2)

## 2010-05-07 LAB — GLUCOSE, CAPILLARY
Glucose-Capillary: 117 mg/dL — ABNORMAL HIGH (ref 70–99)
Glucose-Capillary: 139 mg/dL — ABNORMAL HIGH (ref 70–99)
Glucose-Capillary: 160 mg/dL — ABNORMAL HIGH (ref 70–99)

## 2010-05-07 LAB — PROTIME-INR: INR: 1.71 — ABNORMAL HIGH (ref 0.00–1.49)

## 2010-05-08 ENCOUNTER — Encounter: Payer: Self-pay | Admitting: *Deleted

## 2010-05-18 NOTE — Cardiovascular Report (Signed)
Summary: Office Visit   Office Visit   Imported By: Roderic Ovens 05/09/2010 15:45:19  _____________________________________________________________________  External Attachment:    Type:   Image     Comment:   External Document

## 2010-05-18 NOTE — Letter (Signed)
Summary: Remote Device Check  [facility], Main Office  [address]   [place], [place] [address]   Phone: [phone]  Fax: [phone]     May 08, 2010 MRN: [id number]   [facility] [address] [place], [place]  [address]   Dear Ms. Jaffe,   Your remote transmission was recieved and reviewed by your physician.  All diagnostics were within normal limits for you.  __X____Your next office visit is scheduled for:  May 2012 with Dr Ladona Ridgel. Please call our office to schedule an appointment.    Sincerely,  Vella Kohler

## 2010-06-06 LAB — POCT CARDIAC MARKERS
CKMB, poc: <1 ng/mL — ABNORMAL LOW (ref 1.0–8.0)
Myoglobin, poc: 65.1 ng/mL (ref 12–200)
Troponin i, poc: <0.05 ng/mL (ref 0.00–0.09)

## 2010-06-06 LAB — COMPREHENSIVE METABOLIC PANEL
Alkaline Phosphatase: 59 U/L (ref 39–117)
CO2: 23 mEq/L (ref 19–32)
Chloride: 102 mEq/L (ref 96–112)
GFR calc Af Amer: >60 mL/min (ref >60)
GFR calc non Af Amer: 58 mL/min — ABNORMAL LOW (ref >60)
Glucose, Bld: 158 mg/dL — ABNORMAL HIGH (ref 70–99)
Total Bilirubin: 0.7 mg/dL (ref 0.3–1.2)
Total Protein: 6.9 g/dL (ref 6.0–8.3)

## 2010-06-06 LAB — DIFFERENTIAL
Basophils Absolute: 0.1 K/uL (ref 0.0–0.1)
Basophils Relative: 1 % (ref 0–1)
Eosinophils Absolute: 0.4 K/uL (ref 0.0–0.7)
Eosinophils Relative: 3 % (ref 0–5)
Lymphocytes Relative: 20 % (ref 12–46)
Lymphs Abs: 2.4 10*3/uL (ref 0.7–4.0)
Monocytes Absolute: 0.6 10*3/uL (ref 0.1–1.0)
Monocytes Relative: 5 % (ref 3–12)
Neutro Abs: 8.7 K/uL — ABNORMAL HIGH (ref 1.7–7.7)
Neutrophils Relative %: 72 % (ref 43–77)

## 2010-06-06 LAB — TROPONIN I: Troponin I: 0.01 ng/mL (ref 0.00–0.06)

## 2010-06-06 LAB — CARDIAC PANEL(CRET KIN+CKTOT+MB+TROPI)
CK, MB: 1.8 ng/mL (ref 0.3–4.0)
CK, MB: 2.1 ng/mL (ref 0.3–4.0)
Relative Index: 2 (ref 0.0–2.5)
Troponin I: 0.01 ng/mL (ref 0.00–0.06)
Troponin I: <0.01 ng/mL (ref 0.00–0.06)

## 2010-06-06 LAB — CBC
HCT: 41.5 % (ref 36.0–46.0)
Hemoglobin: 14.6 g/dL (ref 12.0–15.0)
MCHC: 35.2 g/dL (ref 30.0–36.0)
MCV: 87.6 fL (ref 78.0–100.0)
Platelets: 225 10*3/uL (ref 150–400)
RBC: 4.74 MIL/uL (ref 3.87–5.11)
RDW: 13 % (ref 11.5–15.5)
WBC: 12.2 K/uL — ABNORMAL HIGH (ref 4.0–10.5)

## 2010-06-06 LAB — COMPREHENSIVE METABOLIC PANEL WITH GFR
ALT: 41 U/L — ABNORMAL HIGH (ref 0–35)
AST: 37 U/L (ref 0–37)
Albumin: 3.9 g/dL (ref 3.5–5.2)
BUN: 14 mg/dL (ref 6–23)
Calcium: 9.7 mg/dL (ref 8.4–10.5)
Creatinine, Ser: 0.96 mg/dL (ref 0.4–1.2)
Potassium: 3.9 meq/L (ref 3.5–5.1)
Sodium: 137 meq/L (ref 135–145)

## 2010-06-06 LAB — GLUCOSE, CAPILLARY
Glucose-Capillary: 124 mg/dL — ABNORMAL HIGH (ref 70–99)
Glucose-Capillary: 169 mg/dL — ABNORMAL HIGH (ref 70–99)
Glucose-Capillary: 248 mg/dL — ABNORMAL HIGH (ref 70–99)
Glucose-Capillary: 268 mg/dL — ABNORMAL HIGH (ref 70–99)

## 2010-06-06 LAB — CK TOTAL AND CKMB (NOT AT ARMC): Total CK: 78 U/L (ref 7–177)

## 2010-06-06 LAB — PROTIME-INR
INR: 1 (ref 0.00–1.49)
Prothrombin Time: 13.5 s (ref 11.6–15.2)

## 2010-07-03 ENCOUNTER — Encounter: Payer: Self-pay | Admitting: Internal Medicine

## 2010-07-04 NOTE — Discharge Summary (Signed)
Emily Abbott, Emily Abbott NO.:  [id number]   MEDICAL RECORD NO.:  [id number]          PATIENT TYPE:  INP   LOCATION:  2013                         FACILITY:  [facility]   PHYSICIAN:  Corinna L. Lendell Caprice, MDDATE OF BIRTH:  11-15-1940   DATE OF ADMISSION:  12/18/2007  DATE OF DISCHARGE:                               DISCHARGE SUMMARY   DISCHARGE DIAGNOSES:  1. Chest pain.  2. Paroxysmal atrial fibrillation.  3. Hyperlipidemia.  4. Anxiety disorder.  5. Hypertension.  6. Bradycardia.  7. Osteoporosis.  8. Diabetes.   DISCHARGE MEDICATIONS:  1. Decrease Toprol-XL to 25 mg a day.  2. Prilosec 20 mg a day.  3. Valium 2 mg daily as needed for panic attack.  4. Continue aspirin 325 mg a day.  5. Arixtra 60 mg a day.  6. Paxil 30 mg a day.  7. Simvastatin 40 mg a day.  8. Niaspan 650 mg nightly.  9. Hydrochlorothiazide 12.5 mg a day.  10.Glimepiride 2 mg a day.  11.Ambien 10 mg nightly.   FOLLOWUPGretta Arab Valentina Lucks, MD in 2-4 weeks.   ACTIVITY:  Ad lib.   CONSULTATIONS:  Dr. Amil Amen.   PROCEDURES:  Cardiac catheterization, which showed clean coronary  arteries, normal ejection fraction, normal right-sided pressures, cath  report dictation is pending.   DIET:  Low-cholesterol diabetic.   LABORATORY DATA:  Initial white count was 12,000, normal differential,  PT/PTT normal.  Complete metabolic panel significant for a glucose of  128, otherwise remarkable.  Serial cardiac enzymes are negative.  Hemoglobin A1c 6.7.  Total cholesterol 198, triglycerides 264, HDL 29,  LDL 116.  TSH 2.092.  Urinalysis negative for nitrites, trace leukocyte  esterase, 3-6 white cells, rare bacteria, 0-2 red cells.  EKG showed  sinus bradycardia with a rate of 51.  Chest x-ray showed nothing acute.   HISTORY AND HOSPITAL COURSE:  Emily Abbott is a 70 year old white female  with multiple cardiac risk factors, who presented with some  complications and chest pressure.  She has a reported  history of  paroxysmal atrial fibrillation and she takes Toprol-XL.  She was  admitted and ruled out for MI.  She had some bradycardia and Cardiology  was consulted.  They recommended decreasing her metoprolol, which was  done and she remains in normal sinus rhythm.  They also recommended  cardiac catheterization, which showed clean coronaries.  Echocardiogram  was done, but the computer is currently not functional.  It showed  normal ejection fraction.  Please see report.  The patient described a  lot of anxiety and panic and requested changing over to Valium.  I have  also placed her on empiric proton pump inhibitor, and she may follow up  with her primary care physician.      Corinna L. Lendell Caprice, MD  Electronically Signed     CLS/MEDQ  D:  12/22/2007  T:  12/23/2007  Job:  [id number]   cc:   Gretta Arab. Valentina Lucks, M.D.

## 2010-07-04 NOTE — Discharge Summary (Signed)
Emily Abbott, RAYMOND NO.:  [id number]   MEDICAL RECORD NO.:  [id number]          PATIENT TYPE:  INP   LOCATION:  2030                         FACILITY:  [facility]   PHYSICIAN:  Jake Bathe, MD      DATE OF BIRTH:  [birth year]   DATE OF ADMISSION:  04/16/2008  DATE OF DISCHARGE:  04/17/2008                               DISCHARGE SUMMARY   CARDIOLOGIST:  Francisca December, MD   PRIMARY CARE PHYSICIAN:  Gretta Arab. Valentina Lucks, MD   FINAL DIAGNOSES:  1. Paroxysmal atrial fibrillation - spontaneously converted with      assistance of diltiazem in emergency department.  2. New left bundle-branch block - cardiac biomarkers were all normal.      No chest pain, recent cardiac catheterization showed minor      irregularities throughout coronary tree.  3. Anxiety.  4. Hyperlipidemia.  5. Diabetes.  6. Hypertension.   BRIEF HOSPITAL COURSE:  A 70 year old female who was admitted with  palpitations duration of which was approximately 12 hours.  Once in the  emergency department she received diltiazem IV and shortly thereafter  spontaneously converted into sinus rhythm.  During her rapid atrial  fibrillation episode, she had newly discovered left bundle-branch block.  There was concern by the ED physician of acute coronary syndrome.  She  had no chest discomfort or shortness of breath.  She felt mild dizziness  with the atrial fibrillation.  However, no syncope.  She has had no  fevers, chills, nausea, vomiting, or diaphoresis.  Her ECG on the  morning of discharge demonstrated sinus rhythm with left bundle-branch  block, QRS duration of 150 milliseconds.  I do not have previous record  of left bundle-branch block morphology.   As I stated above, she recently had a cardiac catheterization a few  months back, which demonstrated minor irregularities throughout coronary  tree.  She also had a right heart catheterization, which demonstrated  normal RV pressures.   These episodes  of atrial fibrillation had been quite scattered, first  one was 2 years ago.  Last one was in October 2009 and then she  presented with this episode.  The one in October 2009 was also  confounded by an EKG, which demonstrated heart rate of 51.  Her Toprol-  XL had been decreased to 12.5 mg once a day.  Overnight, I placed her on  25 mg of Toprol-XL and diltiazem CD 120 mg once a day.  On the morning  of discharge, she felt well without any complaints, was ambulating well,  no dizziness.   PHYSICAL EXAMINATION:  VITAL SIGNS:  Blood pressure 100 to 111 over 72  with a heart rate in the 60s, sinus rhythm, respirations 20, temperature  was 97.7, sating 97% on room air.  GENERAL:  Alert and oriented x3 in no acute distress, mildly anxious.  CARDIOVASCULAR:  Regular rate and rhythm.  Split S2.  No murmurs, rubs,  or gallops.  LUNGS:  Clear to auscultation bilaterally.  ABDOMEN:  Soft, nontender, normoactive bowel sounds, and obese.  EXTREMITIES:  No clubbing, cyanosis,  or edema.   LABORATORY DATA:  Chest x-ray personally viewed showed no acute air  space disease.  Cardiac biomarkers are negative x3.  TSH was 3.3 normal.  Liver functions showed a mildly elevated ALT of 41 with upper limits of  normal being 35 - she is on Niaspan.  White count was also mildly  elevated at 12.2, hemoglobin 14.6, hematocrit 41.5, and platelets 225.  Glucose was also mildly elevated at 158, potassium was normal at 3.9,  creatinine was 0.96.   DISCHARGE MEDICATIONS:  1. New medication diltiazem CD 120 mg once a day.  2. Toprol-XL increased at 25 mg once a day.  3. Aspirin 325 mg once a day.  4. Valium 2 mg once a day p.r.n.  5. Evista 60 mg once a day.  6. Paxil 30 mg once a day.  7. Niaspan, I believe the dosage is 500 mg once a day.  8. Glimepiride 2 mg once a day.   I have stopped her hydrochlorothiazide secondary to lower blood pressure  in addition of diltiazem as above.   FOLLOWUP:  I will arrange  followup with Tillman Sers or Dr. Corliss Marcus  in 2 weeks' duration.  I have given her telephone number to contact our  office also.  With her new left bundle-branch block, it may not be  reasonable to repeat echocardiogram to ensure that there are no obvious  changes.  Reassuring recent cardiac catheterization.  Also with left  bundle-branch block and previous history of sinus bradycardia, I want  her to be careful especially in the light of the addition of very low-  dose diltiazem in addition to her Toprol.  If she does become  bradycardic or show other evidence of higher grade conduction disorder,  she may require pacemaker.  I discussed this with her briefly in the  emergency department.  She should also follow up with Maurice Small.   DISCHARGE TIME:  30 minutes.      Jake Bathe, MD  Electronically Signed     MCS/MEDQ  D:  04/17/2008  T:  04/17/2008  Job:  [id number]   cc:   Francisca December, M.D.  Gretta Arab Valentina Lucks, M.D.

## 2010-07-04 NOTE — H&P (Signed)
NAMEBHAVYA, ESCHETE NO.:  [id number]   MEDICAL RECORD NO.:  [id number]          PATIENT TYPE:  INP   LOCATION:  2030                         FACILITY:  [facility]   PHYSICIAN:  Jake Bathe, MD      DATE OF BIRTH:  [birth year]   DATE OF ADMISSION:  04/16/2008  DATE OF DISCHARGE:                              HISTORY & PHYSICAL   PRIMARY CARE PHYSICIAN:  Gretta Arab. Valentina Lucks, MD   CARDIOLOGIST:  Francisca December, MD   CHIEF COMPLAINT:  Rapid atrial fibrillation paroxysmal with new left  bundle-branch block.   HISTORY OF PRESENT ILLNESS:  A 70 year old female with diabetes,  hypertension, hyperlipidemia, paroxysmal atrial fibrillation, first  diagnosed 2 years ago then with subsequent admission 4 months ago with  anxiety who earlier last night was awakened with atrial fibrillation,  rapid ventricular response.  Throughout the day, she waited to see if  this would go away then finally called Dr. Marcy Siren office who  told her to go to the emergency department.  She did feel dizzy  throughout the day, but had no frank syncope.  She denied any chest  discomfort or radiation to her jaw like she had previously in October.  During that October admission, she did have a cardiac catheterization,  which demonstrated no evidence of significant coronary artery disease  and also she had a right heart catheterization at the time that showed  no RV or LV interdependence given prior thickened pericardium on  echocardiogram.  Her ejection fraction was normal.   As stated this rapid ventricular response atrial fibrillation began last  night, but was not associated with any shortness of breath, fevers,  syncope, or chest pain.  She now currently feels better now that she  spontaneously converted into sinus rhythm after minimal time on  diltiazem IV.   In her previous admission in October, she was noted to be slightly  bradycardic, and therefore her Toprol-XL was decreased from  50 mg down  to 25 mg.  She is currently taking 12.5 mg.  She did note that she had  had some sinus congestion recently and possible upper respiratory  infection.  She denies any heavy alcohol use or binge drinking.  Prior  thyroid evaluation was unremarkable.   PAST MEDICAL HISTORY:  As above - chest pain evaluated with cardiac  catheterization on December 22, 2007, which showed minor irregularities  throughout coronary tree.  Aortic pressure was 157/76 at that time.  Right-sided pressures were normal.  EF was 65%.   ALLERGIES:  SULFA.   MEDICATIONS:  1. Toprol-XL 2.5 mg once a day.  2. Prilosec.  3. Valium 2 mg for panic p.r.n.  4. Aspirin 325 mg a day.  5. Evista 60 mg a day.  6. Paxil 30 mg a day.  7. Simvastatin 40 mg at night.  8. Niaspan 500 mg at night.  9. Hydrochlorothiazide, she was taking 25 mg once a day.  10.Ambien 10 mg a day.  11.Glimepiride 2 mg a day.   SOCIAL HISTORY:  Denies any tobacco, occasionally has alcoholic  beverage, no illicit drug use.   FAMILY HISTORY:  Currently noncontributory, although she does have a  later family history of coronary artery disease.   PHYSICAL EXAMINATION:  VITAL SIGNS:  Temperature 97.9, pulse on arrival  122, respirations 20, sating 100% on room air, blood pressure originally  140/93 down to 91/54, and as low as 76/48.  GENERAL:  Alert and oriented x3, sitting comfortably in bed next to her  family member in no acute distress.  HEENT:  Eyes, well-perfused conjunctivae.  EOMI.  No scleral icterus.  NECK: Supple.  No carotid bruits.  No lymphadenopathy.  No thyromegaly  appreciated.  CARDIOVASCULAR:  Regular rate and rhythm, currently with no appreciable  murmurs, rubs, or gallops.  Normal PMI.  LUNGS:  Clear to auscultation bilaterally.  Normal respiratory effort.  No wheezes.  No rales.  ABDOMEN:  Mildly obese.  Positive bowel sounds.  No bruits.  Nontender.  No hepatosplenomegaly.  EXTREMITIES:  No clubbing, cyanosis,  or edema.  Normal distal pulses.  SKIN:  Warm, dry, and intact.  No rashes.  NEUROLOGIC:  Nonfocal.  No tremors.  PSYCHIATRIC:  Normal affect.  Slightly anxious.   DATA:  ECG personally viewed shows atrial fibrillation with left bundle-  branch block, which is new in appearance with a heart rate of 127.  Prior ECG on December 20, 2007, showed sinus bradycardia, rate 51 with no  evidence of left bundle-branch block morphology.   LABORATORY DATA:  PT/PTT were within normal limits.  Glucose was mildly  elevated at 158 with a BUN of 14, creatinine of 0.96, potassium of 3.9,  sodium of 137.  ALT was mildly elevated at 41 with an upper limit of  normal at 35.  Cardiac biomarker x1 is negative.   IMAGING:  Chest x-ray personally viewed shows no acute airspace disease.  Prior medical records reviewed.   ASSESSMENT/PLAN:  A 70 year old female with paroxysmal atrial  fibrillation, rapid ventricular response with spontaneous conversion  into sinus rhythm after diltiazem administration with new onset left  bundle-branch block, diabetes, hypertension, anxiety, hyperlipidemia.   PLAN:  Admit to telemetry.   1. Paroxysmal atrial fibrillation - at this point we will continue on      aspirin therapy.  She is currently in sinus rhythm.  She was in for      approximately 12 hour duration.  This is now the third episode she      has had in the past 2 years.  No clearly identifiable etiology.      Past echocardiogram showed left atrial size of 37 mm.  Normal      ejection fraction.  I will go ahead and give her Toprol 12.5 mg      once a day and in addition I will start her on diltiazem CD 120 mg      once a day at a very low dose.  She responded well to the diltiazem      administration earlier.  I am aware that her prior ECG in October      showed sinus bradycardia rate 51 and she has had problems with low      pulse at that time.  We did discuss briefly possible need for      pacemaker in the future  if we are unable to control her tachycardic      response during atrial fibrillation.  At this point, I will not      place her on any antiarrhythmic therapy  given the low frequency of      her atrial fibrillation events.  She does have a mild sinusitis,      which may have exacerbated this atrial fibrillation episode.  In      regards to her blood pressure, I will discontinue her      hydrochlorothiazide given the new administration of diltiazem 120      mg.  She also may be slightly dehydrated and she is currently      receiving 1 L of IV fluid from emergency department.  Also I am      going to check a TSH.  2. Left bundle-branch block, new onset - certainly could be rate      related given her atrial fibrillation.  There was concern by ED      physician of acute coronary syndrome.  She is currently without any      chest pain and is quite comfortable.  I doubt seriously that this      is acute coronary syndrome.  I will check cardiac biomarkers      overnight.  She did have recent reassuring cardiac evaluation with      minor irregularities noted throughout coronary tree.  Continue with      aggressive risk factor prevention.  3. Hyperlipidemia - continue Niaspan.  Prior lipid profile showed      triglycerides of 264, LDL of 116, HDL of 29, total cholesterol of      198.  This was performed in December 19, 2007.  4. Diabetes - last hemoglobin A1c was 6.7 in December 19, 2007, -      excellent control by Dr. Valentina Lucks.  We will continue glimepiride 2      mg once a day and I will also place on insulin sliding scale.      Monitor blood sugars frequently.  5. Anxiety - p.r.n. anxiolytic Valium 2 mg.   1. Deep venous thrombosis prophylaxis - we will place on Lovenox 40 mg      subcu.   DISPOSITION:  Hopefully cardiac biomarkers are reassuring, ECG  reassuring, and the patient is tolerating her low-dose diltiazem as well  as her low-dose Toprol.  She will be able to be discharged tomorrow  with  followup.      Jake Bathe, MD  Electronically Signed     MCS/MEDQ  D:  04/16/2008  T:  04/17/2008  Job:  [id number]   cc:   Gretta Arab. Valentina Lucks, M.D.  Francisca December, M.D.

## 2010-07-04 NOTE — H&P (Signed)
NAMEBRIGHTYN, MOZER NO.:  [id number]   MEDICAL RECORD NO.:  [id number]          PATIENT TYPE:  EMS   LOCATION:  MAJO                         FACILITY:  [facility]   PHYSICIAN:  Michiel Cowboy, MDDATE OF BIRTH:  1940/04/08   DATE OF ADMISSION:  12/18/2007  DATE OF DISCHARGE:                              HISTORY & PHYSICAL   PRIMARY CARE Edoardo Laforte:  Gretta Arab. Valentina Lucks, M.D.   CHIEF COMPLAINT:  A funny sensation in the chest.   Patient is a 70 year old female with a history of paroxysmal atrial  fibrillation with spontaneous resolution, history of hypertension,  dyslipidemia, and osteoporosis.  Patient for the past couple of weeks  has been having sensation/spells where she gets palpitations, sensation  of either chest pressure or just tightness that sometimes lasts between  a few minutes or 30 minutes at a time.  She feels like it is maybe  getting more frequent.  Today when she called EMS, the EMT on call told  her that she may have atrial fibrillation based on the pulse.  She does  have a history of this in the past and kind of felt similar in the past.  Currently, her symptoms are completely resolved, and she is in sinus  rhythm.  Otherwise, no fever, no chills.  She  recently had a cold and  had nausea and vomiting over the past few weeks but not for the past  week or so, otherwise review of systems unremarkable.  No melena, no  hematochezia, no bright red blood per rectum, no localized weakness, no  cough.  No dysuria.  Patient has been endorsing that she feels somewhat  swimmy-headed when she first stands up.   PAST MEDICAL HISTORY:  1. Hyperlipidemia.  2. Hypertension.  3. Paroxysmal atrial fibrillation, for which she was hospitalized two      years ago.  4. History of osteoporosis.  5. Diabetes.   ALLERGIES:  SULFA.   CURRENT MEDICATIONS:  1. Toprol XL 50 mg daily.  2. Aspirin 325 mg daily.  3. Evista 60 mg daily.  4. Paxil 30 mg daily.  5.  Xanax 0.5 mg as needed.  Patient hardly takes it once a week or so.  6. Simvastatin 20 mg p.o. daily.  7. Niaspan 650 mg daily.  8. Hydrochlorothiazide 12.5 mg p.o. daily.  9. Glimepiride 2 mg daily.  10.Ambien 10 mg at night.  11.Welchol 625 mg daily.   SOCIAL HISTORY:  Patient used to smoke but quit some time ago.  Lives at  home alone.  Does not abuse drugs.  Drinks occasionally but not on a  regular basis.   FAMILY HISTORY:  Significant for coronary disease, diabetes, and  strokes.   PHYSICAL EXAMINATION:  VITALS:  Temperature 98, blood pressure 133/74,  pulse 74, respirations 20, satting 97% on room air.  Patient appears to be currently in no acute distress.  Head atraumatic.  Somewhat dry mucous membranes.  LUNGS:  Clear to auscultation bilaterally.  HEART:  Regular rate and rhythm.  No murmurs, rubs or gallops.  ABDOMEN:  Soft, nontender, nondistended but  somewhat obese.  LOWER EXTREMITIES:  Without clubbing, cyanosis or edema.  Strength 5/5  in all four extremities.  Cranial nerves II-XII are intact.   LABS:  White blood cell count 12.3, hemoglobin 14.6, platelets 205.  Sodium 141, potassium 4, creatinine 1.2.  Cardiac enzymes within normal  limits.   EKG showing normal sinus rhythm.  Heart rate of 70.  No evidence of  ischemia or infarction.   Chest x-ray showing no cardiopulmonary disease.   ASSESSMENT/PLAN:  This is a 70 year old female with a history of  anxiety, depression, and paroxysmal atrial fibrillation in the past who  presents with atypical chest pressure/palpitations.  1. History of paroxysmal atrial fibrillation with now palpitations and      a chest pressure sensation which is transient.  I wonder if this is      her atrial fibrillation, but currently she is in sinus rhythm and      not symptomatic.  Will admit to telemetry.  Given risk factors,      will cycle cardiac enzymes, further risk stratify with fasting      lipid panel, hemoglobin A1C.  Will  check TSH.  Check 2D echo.  2. Patient may need outpatient, either Holter monitor or event      monitor.  3. Hyperlipidemia:  Continue home meds.  4. Hypertension:  Continue metoprolol but hold hydrochlorothiazide.      She appears to be clinically somewhat dehydrated.  5. Mild dehydration:  Check orthostatics.  Give IV fluids.  6. Depression:  Continue Paxil.  7. Anxiety:  Continue p.r.n. Xanax.  8. Prophylaxis:  Protonix plus Lovenox.      Michiel Cowboy, MD  Electronically Signed     AVD/MEDQ  D:  12/19/2007  T:  12/19/2007  Job:  [id number]   cc:   Gretta Arab. Valentina Lucks, M.D.

## 2010-07-04 NOTE — Consult Note (Signed)
NAMESHAVETTE, SHOAFF NO.:  [id number]   MEDICAL RECORD NO.:  [id number]          PATIENT TYPE:  OBV   LOCATION:  2013                         FACILITY:  [facility]   PHYSICIAN:  Francisca December, M.D.  DATE OF BIRTH:  [birth year]   DATE OF CONSULTATION:  12/20/2007  DATE OF DISCHARGE:                                 CONSULTATION   REASON FOR CONSULTATION:  Chest discomfort and tachycardia.   HISTORY OF PRESENT ILLNESS:  Emily Abbott is a pleasant 70 year old  woman with a prior history of atrial fibrillation not followed  chronically by Cardiology.  She endorses a history of worsening  exertional fatigue and dyspnea for the past 2 or 3 weeks.  She would try  to do minimum amount of yard work and become exhausted and it seemed to  be worsening on a daily basis.  She finally discontinued this activity  for approximately 1 week, at the same time what she thought were nasal  allergies.  This improved and she returned to the ER for additional work  1 day prior to admission on December 17, 2007.  She promptly had  recurrence of her symptoms of exertional fatigue, exhaustion, and  dyspnea.  She went inside to rest.  She had her heart rate and blood  pressure elevated.  She did take her blood pressure and it was 200/100.  She called her insurance company nurse who recommended a trip to the  emergency room and EMS was called.  In the emergency room, she was not  found to be hypertensive or tachycardiac.  I do not have any ECG strips  or ECG from the EMS transport.   She also relates a history of upper substernal pressure-like discomfort  radiating into the neck that is spontaneous and lasts anywhere from 4-6  minutes.  She has had multiple episodes of these.  They may happen  several times a day.  She has had some here in the hospital up to  yesterday.  They are not necessarily associated with her exertional  symptoms.  The only episode of clear tachy palpitation she had was  when  she was exerting herself on Thursday the day of admission.   PAST MEDICAL HISTORY:  1. Paroxysmal atrial fibrillation.  2. Hyperlipidemia.  3. Hypertension.  4. Osteoporosis.  5. Diabetes mellitus, on oral agent.  6. History of anxiety and panic attacks.  7. Remote history of cardiac catheterization 30% blockages in 1995.   MEDICATIONS ON ADMISSION:  1. Metoprolol ER at 50 mg p.o. daily.  2. Aspirin 325 mg p.o. daily.  3. Evista 60 mg p.o. daily.  4. Citalopram 30 mg p.o. daily.  5. Alprazolam 0.5 mg as needed b.i.d., takes approximately once a week      or so.  6. Simvastatin 20 mg p.o. daily.  7. Niaspan 650 mg p.o. daily.  8. Hydrochlorothiazide 12.5 mg p.o. daily.  9. Glimepiride 2 mg p.o. daily.  10.Ambien 10 mg p.o. nightly.  11.WelChol 625 mg four tablets daily.   DRUG ALLERGIES:  SULFA causes a rash.   SOCIAL HISTORY:  She is a remote smoker.  She lives at home alone.  Husband died in 55.  No alcohol use of significance.   FAMILY HISTORY:  Significant for coronary disease, but not in an early  age; diabetes; and stroke.   REVIEW OF SYSTEMS:  Negative except as mentioned above.   PHYSICAL EXAMINATION:  VITAL SIGNS:  Blood pressure is 117/47 and is  usually well controlled at Dr. Jone Baseman office, pulse is 58-70 in sinus  rhythm and regular.  She did have a 7-beat run of ventricular  tachycardia on the morning of December 20, 2007, at 6:30.  No symptoms  associated.  Temperature 97.7, O2 saturation 95% on room air.  GENERAL:  This is a pleasant well-appearing 70 year old mildly obese  Caucasian woman in no distress.  HEENT:  Unremarkable.  The head is atraumatic and normocephalic.  The  pupils are equal, round, and reactive to light.  Sclerae are anicteric.  Oral mucosa is pink and moist.  Teeth and gums in good repair.  Tongue  is not coated.  NECK:  Supple without thyromegaly or masses.  The carotid upstrokes are  normal.  There is no bruit.  There is no  JVD.  CHEST:  Clear with adequate excursion bilaterally.  No wheezes, rales,  or rhonchi.  HEART:  Regular rhythm.  Normal S1 and S2 is heard.  No S3, S4, murmur,  click, or rub noted.  ABDOMEN:  Obese, soft, and nontender.  No hepatosplenomegaly.  EXTERNAL GENITALIA:  Atrophic and normal.  RECTAL:  Not performed.  EXTREMITIES:  Full range of motion.  There are diminished pedal pulses  bilaterally.  No edema.  SKIN:  Free of lesions and warm, dry, and clear.  NEUROLOGIC:  Cranial nerves II through XII are intact.  Motor and  sensory grossly intact.  Gait not tested.   Electrocardiogram on admission:  Sinus rhythm, no significant findings.   Chest x-ray:  No active cardiopulmonary disease.   CK-MB and troponin negative x2.  White blood cell count 11000,  hemoglobin 12.4, platelets 187,000.  LDL cholesterol 160 and HDL 29.   A 2D echocardiogram has been completed and has shown intact LV systolic  function, EF 55%-60% without regional wall motion abnormality.  There is  mild aortic valvular sclerosis, mitral annular calcification, and mild  to moderate mitral regurgitation.  Doppler indices are indicative of  possible diastolic dysfunction, elevated left atrial pressure, and there  is a thickened pericardium by report.   IMPRESSION:  1. Atypical angina possibly in an unstable pattern.  2. Exertional fatigue, dyspnea, tachycardia, and hypertension.  3. History of panic attacks.  4. Multiple risk factors for coronary heart disease.  5. Intact left ventricular systolic function, mild to moderate mitral      vegetation, and thickened pericardium on echocardiogram suggestive      of possibility of constrictive pericarditis.   PLAN:  1. Review the 2D echocardiogram.  2. Continue telemetry monitoring, may need outpatient 24-hour      ambulatory monitor.  3. Discussed the options of further evaluation for coronary heart      disease.  The patient is willing, but reluctant at this time  to      proceed the catheterization.  We would prefer noninvasive      evaluation initially.  Therefore, we will obtain pharmacologic      stress.  I think she would be able to use a treadmill, although she      states anything that causes  her to have to walk rapidly or up the      hill induces symptoms rapidly, which she cannot ascertain are due      to panic attack versus something more organic.  4. If the thickened pericardium is confirmed with cardiac      catheterization, she will need right and left heart cath for      evaluation of possible RV/LV interaction.  As mentioned, she is      willing to proceed if necessary.  Another option would be to      further evaluate her pericardium with MRI.   Thank you very much for allowing me to assist in the care of Emily Abbott.  It has been a pleasure to do so.  I will discuss her further  care with you.      Francisca December, M.D.  Electronically Signed     JHE/MEDQ  D:  12/20/2007  T:  12/20/2007  Job:  [id number]   cc:   Gretta Arab. Valentina Lucks, M.D.

## 2010-07-06 ENCOUNTER — Ambulatory Visit (INDEPENDENT_AMBULATORY_CARE_PROVIDER_SITE_OTHER): Payer: Medicare Other | Admitting: Internal Medicine

## 2010-07-06 ENCOUNTER — Encounter: Payer: Self-pay | Admitting: Internal Medicine

## 2010-07-06 DIAGNOSIS — I5022 Chronic systolic (congestive) heart failure: Secondary | ICD-10-CM

## 2010-07-06 DIAGNOSIS — I4891 Unspecified atrial fibrillation: Secondary | ICD-10-CM

## 2010-07-06 DIAGNOSIS — I2510 Atherosclerotic heart disease of native coronary artery without angina pectoris: Secondary | ICD-10-CM

## 2010-07-06 DIAGNOSIS — I428 Other cardiomyopathies: Secondary | ICD-10-CM

## 2010-07-07 ENCOUNTER — Encounter: Payer: Self-pay | Admitting: Internal Medicine

## 2010-07-07 NOTE — H&P (Signed)
NAMELARON, ANGELINI NO.:  [id number]   MEDICAL RECORD NO.:  [id number]          PATIENT TYPE:  EMS   LOCATION:  MAJO                         FACILITY:  [facility]   PHYSICIAN:  Jackie Plum, M.D.DATE OF BIRTH:  1941/01/11   DATE OF ADMISSION:  04/14/2005  DATE OF DISCHARGE:                                HISTORY & PHYSICAL   PRIMARY CARE PHYSICIAN:  Dr. Maurice Small.   CHIEF COMPLAINT:  Left arm pain.   HISTORY OF PRESENT ILLNESS:  The patient is a 70 year old white female with  past medical history of hypertension and hyperlipidemia, who presented to  the emergency room after having episodes of left arm pain.  She previously  has been relatively good health and she tells me her blood pressure is well-  controlled.  She started having episodes of left arm aching.  She had no  episodes of chest pain or shortness of breath, but felt a little tightness  midsternally and became concerned and then she called the paramedics.  Paramedics noted by telemetry that the patient was in rapid atrial  fibrillation.  The patient came to the emergency room with a heart rate of  135 and a blood pressure of 149/104; the rest of her vitals were  unremarkable.  On arrival to the emergency room, her lab work was done,  which was essentially unremarkable as well.  The patient was given a 20-mg  dose of IV Cardizem and since that time, her heart rate has come down, even  down to the mid 50s, and her blood pressure dropped down to a systolic of  70s.  Since then, her blood pressure has improved to the one-teens and her  heart rate has started to climb up again.  Currently, the patient is doing  well.  She feels an occasional palpitation, but otherwise is doing well.  Her arm no longer hurts and she denies any headaches, visual changes,  dysphagia, chest pain.  No shortness of breath, wheeze, cough.  No abdominal  pain.  No hematuria, dysuria, constipation, diarrhea, focal  extremity  numbness, weakness or pain.  Her review of systems is otherwise negative.   PAST MEDICAL HISTORY:  1.  Hypertension.  2.  Hyperlipidemia.  3.  Depression.  4.  Osteoporosis.   MEDICATIONS:  1.  Crestor.  2.  Evista.  3.  Hydrochlorothiazide 12.5 mg p.o. daily.  4.  Paxil.  5.  Bisoprolol.   ALLERGIES:  SULFA.   SOCIAL HISTORY:  She denies any tobacco, alcohol or drug use.   FAMILY HISTORY:  Noncontributory.   PHYSICAL EXAMINATION:  VITALS ON ADMISSION:  Temperature 98.2, heart rate  135, now down to about 125, blood pressure initially 149/104, then down to  as low as 77/46, slightly improved to 112/72.  HEENT:  Normocephalic, atraumatic.  Her mucous membranes are moist.  NECK:  She has no carotid bruits.  HEART:  Irregular rhythm, slightly tachycardic.  LUNGS:  Clear to auscultation bilaterally.  ABDOMEN:  Soft, nontender and non-distended.  Positive bowel sounds.  EXTREMITIES:  No clubbing, cyanosis, or edema.  LABORATORY WORK:  White count 9.4, H&H 14 and 40.5, MCV of 86, platelet  count 216,000, no shift.  Sodium 141, potassium 4, chloride 105, bicarb 27,  BUN 16, creatinine 1, glucose 153.  LFTs notable only for an AST of 49, ALT  of 75.  Lipase level normal.  The rest of her lab work is unremarkable.  Cardiac enzymes:  First set -- CPK 63, MB 2.5, troponin I less than 0.5.   EKG just shows atrial fibrillation with some rapid ventricular rate.   ASSESSMENT AND PLAN:  1.  New-onset atrial fibrillation:  The patient responded significantly to      doses of intravenous Cardizem and made her hypotensive.  We will plan to      put the patient on oral Cardizem as well as Lovenox for anticoagulation.      In addition, we will check a 2-D echocardiogram and ask [facility]      for assistance.  2.  Left arm pain in the setting of new arrhythmia, questionable cardiac      ischemia:  We will check 2 more sets of cardiac enzymes and ask [facility]      Cardiology  for assistance.  The patient will likely need a stress test.  3.  Slightly elevated blood sugars:  We will check a hemoglobin A1c with      next blood draw to rule out any type of diabetic component.  4.  Hypertension:  Continue hydrochlorothiazide and start the patient on      Cardizem.  5.  Hyperlipidemia:  Continue the patient's Crestor.  6.  Depression:  Once we get the dose of her Paxil, we will continue this as      well.      Hollice Espy, M.D.  Electronically Signed      Jackie Plum, M.D.  Electronically Signed    SKK/MEDQ  D:  04/14/2005  T:  04/14/2005  Job:  [id number]   cc:   Gretta Arab. Valentina Lucks, M.D.  Fax: [phone]   [facility]

## 2010-07-07 NOTE — Progress Notes (Signed)
HPI Emily Abbott returns today for followup. She is a very pleasant 70 year old woman, with a history of atrial fibrillation, congestive heart failure, and coronary disease. She also has dyslipidemia. She is status post ICD implantation. She denies chest pain, shortness of breath, but has very mild peripheral edema. She has had no recent syncope and denies any recent ICD shocks. Allergies  Allergen Reactions  . Sulfonamide Derivatives      Current Outpatient Prescriptions  Medication Sig Dispense Refill  . albuterol (PROAIR HFA) 108 (90 BASE) MCG/ACT inhaler UAD as needed       . ALPRAZolam (XANAX) 0.5 MG tablet Take 0.5 mg by mouth 2 (two) times daily.        Marland Kitchen aspirin 325 MG tablet Take 325 mg by mouth daily.        . carvedilol (COREG) 6.25 MG tablet Take 6.25 mg by mouth 2 (two) times daily with a meal.        . cetirizine (ZYRTEC HIVES RELIEF) 10 MG tablet Take 10 mg by mouth daily.        . Cholecalciferol (VITAMIN D3) 1000 UNITS CAPS Take 1 tablet by mouth daily.        . diphenhydrAMINE (BENADRYL) 25 mg capsule Take 25 mg by mouth at bedtime.        . diphenhydramine-acetaminophen (TYLENOL PM EXTRA STRENGTH) 25-500 MG TABS Take by mouth at bedtime as needed.        . dofetilide (TIKOSYN) 250 MCG capsule Take 250 mcg by mouth 2 (two) times daily.        . Fluticasone-Salmeterol (ADVAIR DISKUS) 100-50 MCG/DOSE AEPB Inhale 1 puff into the lungs 2 (two) times daily.        Marland Kitchen HYDROcodone-acetaminophen (VICODIN) 5-500 MG per tablet Take 1 tablet by mouth as needed.        . metFORMIN (GLUCOPHAGE) 850 MG tablet Take 850 mg by mouth 2 (two) times daily with a meal.        . niacin (NIASPAN) 750 MG CR tablet Take 750 mg by mouth 2 (two) times daily.        Marland Kitchen omeprazole (PRILOSEC) 40 MG capsule Take 40 mg by mouth daily.        Marland Kitchen PARoxetine (PAXIL) 30 MG tablet Take 30 mg by mouth daily.        . simvastatin (ZOCOR) 40 MG tablet Take 40 mg by mouth at bedtime.        Marland Kitchen tiotropium (SPIRIVA) 18  MCG inhalation capsule Place 18 mcg into inhaler and inhale daily.        . traZODone (DESYREL) 50 MG tablet Take 50 mg by mouth at bedtime.        Marland Kitchen warfarin (COUMADIN) 5 MG tablet Use as directed by Bertram Gala, MD          Past Medical History  Diagnosis Date  . Paroxysmal atrial fibrillation   . CAD (coronary artery disease)   . HTN (hypertension)     essential  . Hyperlipidemia, mixed   . Diabetes mellitus     type II  . Mitral valve disorder   . GERD (gastroesophageal reflux disease)   . Pulmonary embolism   . Anemia   . COPD (chronic obstructive pulmonary disease)   . Osteoarthritis   . Anxiety   . Panic attack     ROS:   All systems reviewed and negative except as noted in the HPI.   Past Surgical History  Procedure Date  .  Btl   . Biopsy breast   . Carpal tennel 2003  . Cardiac valve surgery      No family history on file.   History   Social History  . Marital Status: Widowed    Spouse Name: N/A    Number of Children: N/A  . Years of Education: N/A   Occupational History  . Not on file.   Social History Main Topics  . Smoking status: Former Smoker    Types: Cigarettes    Quit date: 02/19/2001  . Smokeless tobacco: Not on file  . Alcohol Use: No  . Drug Use: Not on file  . Sexually Active: Not on file   Other Topics Concern  . Not on file   Social History Narrative  . No narrative on file     BP 150/80  Pulse 80  Ht 5\' 6"  (1.676 m)  Wt 216 lb (97.977 kg)  BMI 34.86 kg/m2  Physical Exam:  Well appearing NAD HEENT: Unremarkable Neck:  No JVD, no thyromegally Lymphatics:  No adenopathy Back:  No CVA tenderness Lungs:  Clear. Well-healed ICD incision. HEART:  Regular rate rhythm, no murmurs, no rubs, no clicks Abd:  Flat, positive bowel sounds, no organomegally, no rebound, no guarding Ext:  2 plus pulses, no edema, no cyanosis, no clubbing Skin:  No rashes no nodules Neuro:  CN II through XII intact, motor grossly  intact  DEVICE  Normal device function.  See PaceArt for details.   Assess/Plan:

## 2010-07-07 NOTE — Assessment & Plan Note (Signed)
She is well controlled on Tikosyn. She is in atrial fibrillation less than 1% of the time.

## 2010-07-07 NOTE — Assessment & Plan Note (Signed)
Her symptoms appear to be class II. She will continue a low-sodium diet and her current medications.

## 2010-07-07 NOTE — Op Note (Signed)
NAME:  Emily Abbott, SCHECTER                          ACCOUNT NO.:  [id number]   MEDICAL RECORD NO.:  [id number]                   PATIENT TYPE:  AMB   LOCATION:  DSC                                  FACILITY:  [facility]   PHYSICIAN:  Katy Fitch. Naaman Plummer., M.D.          DATE OF BIRTH:  02-10-1941   DATE OF PROCEDURE:  12/30/2001  DATE OF DISCHARGE:                                 OPERATIVE REPORT   PREOPERATIVE DIAGNOSIS:  Bilateral carpal tunnel syndrome.   POSTOPERATIVE DIAGNOSIS:  Bilateral carpal tunnel syndrome.   OPERATION PERFORMED:  1. Release of right transverse carpal ligament  (16109-UE).  2. Injection of left wrist ulnar bursa with Depo-Medrol and lidocaine ([id number]     LP).   SURGEON:  Katy Fitch. Sypher, M.D.   ASSISTANT:  Jonni Sanger, P.A.   ANESTHESIA:  General by LMA.   SUPERVISING ANESTHESIOLOGIST:  Maren Beach, M.D.   INDICATIONS FOR PROCEDURE:  The patient is a 70 year old woman referred by  her primary medical physician, Dr. Maurice Small for evaluation and  management of hand numbness and discomfort.  Clinical examination revealed  signs of probable bilateral carpal tunnel syndrome.  Electrodiagnostic  studies completed by [facility] had revealed unobtainable sensory  latencies on the right and significant prolongation of the motor and sensory  latencies on the left.   After informed consent she is advised to present for release of her right  transverse carpal ligament at this time and injection of the left ulnar  bursa in an effort to ameliorate her symptoms on that side pending surgery.   DESCRIPTION OF PROCEDURE:  The patient was brought to the operating room and  placed in supine position upon the operating table.  Following induction of  general anesthesia by LMA, the left arm was prepped with Betadine soap and  solution and sterilely draped.  Following exsanguination of the limb with an  Esmarch bandage, an arterial tourniquet at the  proximal brachium was  inflated to 220 mmHg.  The procedure commenced with a short incision in line  with the ring finger in the palm.  Subcutaneous tissues are carefully  divided from the palmar fascia.  This was split longitudinally with scissors  to reveal the common sensory branch of the median nerve and the superficial  palmar arch.  The common sensory branches were followed back to the median  nerve proper which was separated from the transverse carpal ligament.  The  ligament was released on its ulnar border extending to the distal forearm.  This widely opened the carpal canal.  No masses or other predicaments were  noted.  Bleeding points along the margin of the released ligament were  electrocauterized with bipolar current followed by repair of the skin with  intradermal 3-0 Prolene.  A Steri-Strips was applied.   Attention was then directed to the left wrist.  After prep of the skin with  alcohol,  the ulnar bursa was injected with 2 cc of a mixture of 0.4 cc of  Depo-Medrol 40 mg per ml and 1% plain lidocaine.  This was a satisfactory  injection.   The patient was placed in a compressive dressing on the right with a volar  splint maintaining the wrist in 5 degrees dorsiflexion.  She was awakened  from anesthesia and transferred to the recovery room with stable vital  signs.   For aftercare she was given a prescription for Darvocet-N 100 one or two  tablets p.o. q.4-6h. p.r.n. pain.  She will return to our office for follow-  up in 7 to 10 days to begin an exercise program.                                               Katy Fitch. Naaman Plummer., M.D.    RVS/MEDQ  D:  12/30/2001  T:  12/30/2001  Job:  [id number]   cc:   Gretta Arab. Valentina Lucks, M.D.  [address] [place]  [place] [address]  Fax: [phone]

## 2010-07-07 NOTE — Consult Note (Signed)
NAMEAINE, STRYCHARZ NO.:  [id number]   MEDICAL RECORD NO.:  [id number]          PATIENT TYPE:  INP   LOCATION:  3731                         FACILITY:  [facility]   PHYSICIAN:  Lyn Records, M.D.   DATE OF BIRTH:  1940-05-11   DATE OF CONSULTATION:  04/15/2005  DATE OF DISCHARGE:                                   CONSULTATION   CONCLUSION:  1.  Paroxysmal atrial fibrillation of unknown duration resolving      spontaneously after starting Cardizem on April 14, 2005.  2.  Chest discomfort during atrial fibrillation described as a mild      tightness intermittently.  Question angina due to coronary disease.      Question angina due to demand caused by increased rate.  Question      noncardiac chest pain.  3.  Throbbing left arm and left scapula discomfort of uncertain etiology.      Possibly coronary ischemic.  Possibly non-ischemic such as neuropathic      or musculoskeletal.  4.  History of hypertension.  5.  History of hyperlipidemia.  6.  Prior cardiac evaluation 1996 with essentially normal coronaries.   RECOMMENDATIONS:  1.  2-D Doppler echocardiogram to assess cardiac structure and function.  2.  Adenosine Cardiolite (patient states that she will not be able to walk      on a treadmill).  This will help to exclude significant underlying      coronary artery disease in this patient over the past 11 years since her      cardiac catheterization in 1996.  3.  Check a lipid profile.  4.  Consideration of Coumadin therapy depending upon findings from      echocardiogram.  At the very least the patient will need to be left on      an aspirin per day.  5.  Increase the dose of the patient's beta blocker therapy to hopefully      help suppress recurrences of atrial fibrillation.  She ordinarily takes      bisoprolol 5 mg per day.   COMMENTS:  The patient is 68 and was admitted to the hospital on April 14, 2005 with atrial fibrillation with rapid rate and  a two-week history of  fatigue.  A less than 24-hour history of aching left arm and scapular  discomfort.  A poorly characterized associated mid sternal chest tightness.  She was also having tachy palpitations on admission and states that  previously she had had this intermittently but it did not seem to last very  long.   ALLERGIES:  SULFA.   HABITS:  Discontinued smoking three years ago.  Denies ethanol consumption.   FAMILY HISTORY:  The patient's mother had a myocardial infarction in her  57s.  Several of her mother's siblings had vascular disease.  She has one  sister and one brother with coronary disease.   MEDICATIONS ON ADMISSION:  1.  Bisoprolol/HCTZ 5/6.25 mg per day.  2.  Paxil 15 mg per day.  3.  Evista 60 mg per day.  4.  Crestor  10 mg per day.  5.  HCTZ 12.5 mg per day.   PHYSICAL EXAMINATION:  GENERAL:  Patient is now lying comfortably in bed on  3700 in no acute distress.  VITAL SIGNS:  Heart rate 70, blood pressure 142/80.  SKIN:  Clear.  HEENT:  No JVD is noted.  No carotid bruits are heard.  LUNGS:  Clear to auscultation and percussion.  CARDIAC:  No rub.  No click.  No gallop.  No murmur.  ABDOMEN:  Bowel sounds are normal.  Abdomen is nontender.  EXTREMITIES:  No edema.   Initial EKG on April 14, 2005 demonstrated atrial fibrillation, rapid  ventricular response with ST-T abnormality inferolaterally at a rate of 134  beats per minute.  Post conversion EKGs demonstrate no acute changes and  essentially a normal-appearing tracing.  Chest x-ray is normal with the  exception of hyperinflation.   LABORATORY DATA:  Negative troponins and cardiac CK markers.  There is an  elevated hemoglobin A1c at 6.5.  Hemoglobin 14.  PT and PTT were not  performed.  Patient is receiving subcutaneous Lovenox currently.   DISCUSSION:  It is difficult to tell from the patient's history how long  atrial fibrillation had been present prior to admission to the hospital.  She  had not felt well for at least 10 days prior to admission complaining of  fatigue, weakness, and lightheadedness.  She became acutely aware of tachy  palpitations on Friday night and early Saturday morning when she was also  concerned about the discomfort in the left arm which prompted her visit to  the emergency room.  We need to exclude coronary disease.  She certainly has  not had evidence of myocardial infarction.  We also need to determine  whether or not our Coumadin therapy would be appropriate.  Not knowing the  duration of this episode of atrial fibrillation it is difficult to know what  we should do but certainly if there are structural abnormalities on the  echocardiogram I would lean towards Coumadin anticoagulation.      Lyn Records, M.D.  Electronically Signed     HWS/MEDQ  D:  04/15/2005  T:  04/15/2005  Job:  [id number]   cc:   Gretta Arab. Valentina Lucks, M.D.  Fax: [phone]

## 2010-07-07 NOTE — Op Note (Signed)
Emily Abbott, MOE NO.:  [id number]   MEDICAL RECORD NO.:  [id number]          PATIENT TYPE:  AMB   LOCATION:  ENDO                         FACILITY:  [facility]   PHYSICIAN:  Graylin Shiver, M.D.   DATE OF BIRTH:  16-Dec-1940   DATE OF PROCEDURE:  02/22/2004  DATE OF DISCHARGE:                                 OPERATIVE REPORT   PROCEDURE:  Colonoscopy with biopsy.   ENDOSCOPIST:  Graylin Shiver, M.D.   INDICATION FOR PROCEDURE:  Screening.   Informed consent was obtained after explanation of the risks of bleeding,  infection, and perforation.   PREMEDICATIONS:  Fentanyl 60 mcg IV, Versed 8 mg IV.   DESCRIPTION OF PROCEDURE:  With the patient in the left lateral decubitus  position, a rectal exam was performed.  No masses were felt.  The Olympus  colonoscope was inserted into the rectum and advanced around a tortuous  colon to the cecum.  Cecal landmarks were identified.  In the cecum, there  was a small 3-mm polyp biopsied off with cold forceps.  The ascending colon  looked normal.  At the level of the hepatic flexure, there was a small 3-mm  polyp biopsied off with cold forceps.  The transverse colon looked normal.  The descending colon looked normal.  In the sigmoid, there were three small  2-3 mm polyps biopsied off with cold forceps. The rectum looked normal.  She  tolerated the procedure well without complications.   IMPRESSION:  Several small colon polyps biopsied off, Diagnosis Code 211.3.   PLAN:  The pathology will be checked.       SFG/MEDQ  D:  02/22/2004  T:  02/22/2004  Job:  [id number]   cc:   Gretta Arab. Valentina Lucks, M.D.  [address] [place]  [place] [address]  Fax: [phone]

## 2010-07-07 NOTE — Assessment & Plan Note (Signed)
She denies anginal symptoms. She will continue her current medications. I've encouraged her increase her physical activity.

## 2010-07-07 NOTE — Discharge Summary (Signed)
[name], Emily Abbott NO.:  [id number]   MEDICAL RECORD NO.:  [id number]          PATIENT TYPE:  INP   LOCATION:  3731                         FACILITY:  [facility]   PHYSICIAN:  Jackie Plum, M.D.DATE OF BIRTH:  12-28-1940   DATE OF ADMISSION:  04/14/2005  DATE OF DISCHARGE:  04/17/2005                                 DISCHARGE SUMMARY   DISCHARGE DIAGNOSES:  1.  Paroxysmal atrial fibrillation, resolved spontaneously.  2.  History of hypertension.  3.  History of dyslipidemia.  4.  History of osteoporosis and depressive disorder.   DISCHARGE MEDICATIONS:  1.  Toprol XL 50 mg daily.  2.  Aspirin 325 mg daily.  3.  Crestor 10 mg daily.  4.  Evista 60 mg daily.  5.  Diltiazem 12.5 mg daily.  6.  Paxil 15 mg daily.  7.  Xanax 0.25 mg, as previously.  8.  Albuterol metered dose inhaler,2 puffs qid.  9.  Patient was asked not to take her bisoprolol/HCTZ.   She is to follow up with Dr. Valentina Lucks, her PCP in 1-2 weeks.  She was  instructed to follow up with her PCP here if she develops any chest pain,  arm pain, or any rapid heartbeat, and also shortness of breath.   CONSULTS:  [facility], Dr. Verdis Prime.   PROCEDURES:  Adenosine Cardiolite.   CONDITION ON DISCHARGE:  Improved, satisfactory.   Patient presented with new onset of atrial fibrillation with chest  discomfort and probable left arm and scapular pain.  See H&P as dictated by  Dr. Virginia Rochester dated April 14, 2005 for full details regarding the  patient's presentation.   On admission, the patient was admitted to a telemetry bed.  EKG shows atrial  fibrillation.  Her A fib resolved spontaneously.  She had treatment with  Cardizem.  The patient was seen by Dr. Verdis Prime, who recommended a 2D  echocardiogram and Adenosine Cardiolite.  Her Cardizem was discontinued, and  the patient was started on beta blockade.  The patient's symptoms resolved,  and her Adenosine Cardiolite showed no  reversible ischemia.  She had normal  left atrial size and normal left ventricular function.   The echocardiogram was grossly abnormal.  She was deemed not a candidate for  catheterization and was discharged home by my colleague, Dr. Derenda Mis,  on April 17, 2005 during rounds in stable, satisfactory condition.  The  patient was discharged home on aspirin and beta blocker, to follow up with  PCP, as listed above.      Jackie Plum, M.D.  Electronically Signed     GO/MEDQ  D:  06/27/2005  T:  06/28/2005  Job:  [id number]

## 2010-08-04 ENCOUNTER — Other Ambulatory Visit: Payer: Self-pay | Admitting: Internal Medicine

## 2010-08-04 MED ORDER — DOFETILIDE 250 MCG PO CAPS: 250.0000 ug | ORAL_CAPSULE | Freq: Two times a day (BID) | ORAL | Status: DC

## 2010-08-04 NOTE — Telephone Encounter (Signed)
Pt has only 1 pill left, said [facility], and called today, needs called in today and would like refill to last for a year

## 2010-10-05 ENCOUNTER — Ambulatory Visit (INDEPENDENT_AMBULATORY_CARE_PROVIDER_SITE_OTHER): Payer: Medicare Other | Admitting: *Deleted

## 2010-10-05 ENCOUNTER — Encounter: Payer: Self-pay | Admitting: Internal Medicine

## 2010-10-05 ENCOUNTER — Other Ambulatory Visit: Payer: Self-pay | Admitting: Internal Medicine

## 2010-10-05 DIAGNOSIS — I4891 Unspecified atrial fibrillation: Secondary | ICD-10-CM

## 2010-10-05 DIAGNOSIS — I428 Other cardiomyopathies: Secondary | ICD-10-CM

## 2010-10-05 LAB — REMOTE ICD DEVICE
AL IMPEDENCE ICD: 390 Ohm
ATRIAL PACING ICD: 6.7 pct
BAMS-0001: 150 {beats}/min
BAMS-0003: 80 {beats}/min
HV IMPEDENCE: 65 Ohm
LV LEAD IMPEDENCE ICD: 900 Ohm
LV LEAD THRESHOLD: 0.75 V
RV LEAD IMPEDENCE ICD: 480 Ohm

## 2010-10-13 NOTE — Progress Notes (Signed)
icd remote check  

## 2010-11-01 ENCOUNTER — Encounter: Payer: Self-pay | Admitting: *Deleted

## 2010-11-02 ENCOUNTER — Telehealth: Payer: Self-pay | Admitting: Internal Medicine

## 2010-11-02 NOTE — Telephone Encounter (Signed)
Pt calling to speak with Emily Abbott. Please return pt call.

## 2010-11-03 NOTE — Telephone Encounter (Signed)
Pt notified that we received transmission.

## 2010-11-05 ENCOUNTER — Inpatient Hospital Stay (INDEPENDENT_AMBULATORY_CARE_PROVIDER_SITE_OTHER)
Admission: RE | Admit: 2010-11-05 | Discharge: 2010-11-05 | Disposition: A | Discharge status: Home / Self Care | Payer: Medicare Other | Source: Ambulatory Visit | Attending: Family Medicine | Admitting: Family Medicine

## 2010-11-05 DIAGNOSIS — T148XXA Other injury of unspecified body region, initial encounter: Secondary | ICD-10-CM

## 2010-11-20 LAB — CBC
HCT: 35.8 — ABNORMAL LOW
Hemoglobin: 14
MCHC: 34.4
MCHC: 34.5
MCV: 88.4
Platelets: 187
RBC: 4.6
WBC: 12.3 — ABNORMAL HIGH

## 2010-11-20 LAB — URINALYSIS, ROUTINE W REFLEX MICROSCOPIC
Bilirubin Urine: NEGATIVE
Glucose, UA: NEGATIVE
Hgb urine dipstick: NEGATIVE
Ketones, ur: NEGATIVE
Nitrite: NEGATIVE
Protein, ur: NEGATIVE
Specific Gravity, Urine: 1.007
Urobilinogen, UA: 0.2
pH: 6.5

## 2010-11-20 LAB — COMPREHENSIVE METABOLIC PANEL
AST: 30
Albumin: 3.9
BUN: 11
Calcium: 9.3
Chloride: 106
Creatinine, Ser: 0.97
GFR calc Af Amer: >60
GFR calc non Af Amer: 57 — ABNORMAL LOW
Total Bilirubin: 0.8

## 2010-11-20 LAB — POCT I-STAT, CHEM 8
BUN: 13
Calcium, Ion: 1.23
Chloride: 105
Creatinine, Ser: 1.2
Glucose, Bld: 128 — ABNORMAL HIGH
HCT: 43
Hemoglobin: 14.6
Potassium: 4
Sodium: 141
TCO2: 28

## 2010-11-20 LAB — HEMOGLOBIN A1C
Hgb A1c MFr Bld: 6.7 — ABNORMAL HIGH
Mean Plasma Glucose: 146

## 2010-11-20 LAB — DIFFERENTIAL
Basophils Relative: 1
Lymphocytes Relative: 22
Lymphs Abs: 2.8
Monocytes Absolute: 0.7
Monocytes Relative: 6
Neutro Abs: 8.4 — ABNORMAL HIGH
Neutrophils Relative %: 69

## 2010-11-20 LAB — GLUCOSE, CAPILLARY
Glucose-Capillary: 113 — ABNORMAL HIGH
Glucose-Capillary: 115 — ABNORMAL HIGH
Glucose-Capillary: 120 — ABNORMAL HIGH
Glucose-Capillary: 152 — ABNORMAL HIGH
Glucose-Capillary: 161 — ABNORMAL HIGH
Glucose-Capillary: 165 — ABNORMAL HIGH
Glucose-Capillary: 168 — ABNORMAL HIGH
Glucose-Capillary: 196 — ABNORMAL HIGH
Glucose-Capillary: 95

## 2010-11-20 LAB — CARDIAC PANEL(CRET KIN+CKTOT+MB+TROPI)
Relative Index: 1.5
Total CK: 140
Troponin I: 0.01
Troponin I: 0.01

## 2010-11-20 LAB — POCT CARDIAC MARKERS
CKMB, poc: 2
CKMB, poc: 2.2
Myoglobin, poc: 110
Myoglobin, poc: 97.1
Troponin i, poc: <0.05
Troponin i, poc: <0.05

## 2010-11-20 LAB — LIPID PANEL
Cholesterol: 198
LDL Cholesterol: 116 — ABNORMAL HIGH

## 2010-11-20 LAB — URINE MICROSCOPIC-ADD ON

## 2010-11-20 LAB — CK TOTAL AND CKMB (NOT AT ARMC)
CK, MB: 2.4
Relative Index: 1.6
Total CK: 152

## 2010-11-20 LAB — URINE CULTURE
Colony Count: NO GROWTH
Culture: NO GROWTH

## 2010-11-20 LAB — TSH: TSH: 2.092

## 2010-11-20 LAB — APTT: aPTT: 29

## 2010-11-20 LAB — PROTIME-INR: INR: 1.1

## 2010-11-20 LAB — TROPONIN I: Troponin I: 0.03

## 2010-11-21 LAB — GLUCOSE, CAPILLARY
Glucose-Capillary: 119 — ABNORMAL HIGH
Glucose-Capillary: 128 — ABNORMAL HIGH
Glucose-Capillary: 135 — ABNORMAL HIGH
Glucose-Capillary: 138 — ABNORMAL HIGH
Glucose-Capillary: 147 — ABNORMAL HIGH
Glucose-Capillary: 151 — ABNORMAL HIGH
Glucose-Capillary: 180 — ABNORMAL HIGH

## 2010-11-21 LAB — POCT I-STAT 3, VENOUS BLOOD GAS (G3P V)
Acid-Base Excess: 1
Acid-Base Excess: 2
Bicarbonate: 25.4 — ABNORMAL HIGH
Bicarbonate: 25.9 — ABNORMAL HIGH
Bicarbonate: 26.8 — ABNORMAL HIGH
O2 Saturation: 55
O2 Saturation: 60
O2 Saturation: 63
TCO2: 27
TCO2: 27
TCO2: 28
pCO2, Ven: 42 — ABNORMAL LOW
pCO2, Ven: 42.4 — ABNORMAL LOW
pCO2, Ven: 43.1 — ABNORMAL LOW
pH, Ven: 7.39 — ABNORMAL HIGH
pH, Ven: 7.393 — ABNORMAL HIGH
pH, Ven: 7.401 — ABNORMAL HIGH
pO2, Ven: 29 — CL
pO2, Ven: 31
pO2, Ven: 33

## 2010-11-21 LAB — POCT I-STAT 3, ART BLOOD GAS (G3+)
Bicarbonate: 24.5 — ABNORMAL HIGH
O2 Saturation: 94
TCO2: 26
pCO2 arterial: 40.3
pH, Arterial: 7.393
pO2, Arterial: 71 — ABNORMAL LOW

## 2010-12-04 ENCOUNTER — Other Ambulatory Visit: Payer: Self-pay

## 2010-12-05 ENCOUNTER — Other Ambulatory Visit: Payer: Self-pay

## 2010-12-05 MED ORDER — DOFETILIDE 250 MCG PO CAPS: 250.0000 ug | ORAL_CAPSULE | Freq: Two times a day (BID) | ORAL | Status: DC

## 2011-01-04 ENCOUNTER — Other Ambulatory Visit: Payer: Self-pay | Admitting: Internal Medicine

## 2011-01-04 ENCOUNTER — Ambulatory Visit (INDEPENDENT_AMBULATORY_CARE_PROVIDER_SITE_OTHER): Payer: Medicare Other | Admitting: *Deleted

## 2011-01-04 ENCOUNTER — Encounter: Payer: Self-pay | Admitting: Internal Medicine

## 2011-01-04 DIAGNOSIS — I4891 Unspecified atrial fibrillation: Secondary | ICD-10-CM

## 2011-01-04 DIAGNOSIS — I428 Other cardiomyopathies: Secondary | ICD-10-CM

## 2011-01-04 LAB — REMOTE ICD DEVICE
AL IMPEDENCE ICD: 380 Ohm
BAMS-0001: 150 {beats}/min
DEVICE MODEL ICD: 752248
LV LEAD IMPEDENCE ICD: 900 Ohm
RV LEAD IMPEDENCE ICD: 490 Ohm
VENTRICULAR PACING ICD: 100 pct

## 2011-01-10 NOTE — Progress Notes (Signed)
Remote icd check  

## 2011-01-12 ENCOUNTER — Encounter: Payer: Self-pay | Admitting: *Deleted

## 2011-04-05 ENCOUNTER — Encounter: Payer: Self-pay | Admitting: Internal Medicine

## 2011-04-05 ENCOUNTER — Ambulatory Visit (INDEPENDENT_AMBULATORY_CARE_PROVIDER_SITE_OTHER): Payer: Medicare Other | Admitting: *Deleted

## 2011-04-05 DIAGNOSIS — I5022 Chronic systolic (congestive) heart failure: Secondary | ICD-10-CM

## 2011-04-05 DIAGNOSIS — I428 Other cardiomyopathies: Secondary | ICD-10-CM

## 2011-04-05 LAB — REMOTE ICD DEVICE
AL IMPEDENCE ICD: 390 Ohm
ATRIAL PACING ICD: 18 pct
BAMS-0001: 150 {beats}/min
BAMS-0003: 80 {beats}/min
BRDY-0002RA: 60 {beats}/min
DEVICE MODEL ICD: 752248
HV IMPEDENCE: 63 Ohm
LV LEAD IMPEDENCE ICD: 900 Ohm
RV LEAD IMPEDENCE ICD: 460 Ohm
VENTRICULAR PACING ICD: 100 pct

## 2011-04-13 NOTE — Progress Notes (Signed)
ICD remote with ICM 

## 2011-04-18 ENCOUNTER — Encounter: Payer: Self-pay | Admitting: *Deleted

## 2011-05-23 ENCOUNTER — Telehealth: Payer: Self-pay | Admitting: Internal Medicine

## 2011-05-23 NOTE — Telephone Encounter (Signed)
Having cataract surgery next Tuesday.  The eye doctor said its okay to stay on so that is what she is going to do

## 2011-05-23 NOTE — Telephone Encounter (Signed)
New msg Pt called and said she is having cataract surgery. She wants to know if she should come off of coumadin before surgery.

## 2011-07-10 ENCOUNTER — Ambulatory Visit (INDEPENDENT_AMBULATORY_CARE_PROVIDER_SITE_OTHER): Payer: Medicare Other | Admitting: Internal Medicine

## 2011-07-10 ENCOUNTER — Encounter: Payer: Self-pay | Admitting: Internal Medicine

## 2011-07-10 VITALS — BP 114/70 | HR 72 | Ht 66.5 in | Wt 218.1 lb

## 2011-07-10 DIAGNOSIS — Z95810 Presence of automatic (implantable) cardiac defibrillator: Secondary | ICD-10-CM | POA: Insufficient documentation

## 2011-07-10 DIAGNOSIS — I4891 Unspecified atrial fibrillation: Secondary | ICD-10-CM

## 2011-07-10 DIAGNOSIS — I428 Other cardiomyopathies: Secondary | ICD-10-CM

## 2011-07-10 DIAGNOSIS — I5022 Chronic systolic (congestive) heart failure: Secondary | ICD-10-CM

## 2011-07-10 LAB — ICD DEVICE OBSERVATION
AL AMPLITUDE: 3.5 mv
AL IMPEDENCE ICD: 380 Ohm
BAMS-0001: 150 {beats}/min
HV IMPEDENCE: 66 Ohm
LV LEAD IMPEDENCE ICD: 940 Ohm
LV LEAD THRESHOLD: 0.75 V
RV LEAD IMPEDENCE ICD: 460 Ohm
RV LEAD THRESHOLD: 0.5 V

## 2011-07-10 MED ORDER — DOFETILIDE 250 MCG PO CAPS: 250.0000 ug | ORAL_CAPSULE | Freq: Two times a day (BID) | ORAL | Status: DC

## 2011-07-10 NOTE — Patient Instructions (Signed)
Remote monitoring is used to monitor your Pacemaker of ICD from home. This monitoring reduces the number of office visits required to check your device to one time per year. It allows us to keep an eye on the functioning of your device to ensure it is working properly. You are scheduled for a device check from home on October 11, 2011. You may send your transmission at any time that day. If you have a wireless device, the transmission will be sent automatically. After your physician reviews your transmission, you will receive a postcard with your next transmission date.  Your physician wants you to follow-up in: 1 year with Dr Taylor.  You will receive a reminder letter in the mail two months in advance. If you don't receive a letter, please call our office to schedule the follow-up appointment.  

## 2011-07-10 NOTE — Assessment & Plan Note (Signed)
Her device is working normally. We'll plan to recheck in several months. The programming changes made today.

## 2011-07-10 NOTE — Assessment & Plan Note (Signed)
Her symptoms are class II. She will continue her current medical therapy. She'll maintain a low-sodium diet. 

## 2011-07-10 NOTE — Assessment & Plan Note (Signed)
She is maintaining sinus rhythm very nicely. She will continue her current medical therapy. 

## 2011-07-10 NOTE — Progress Notes (Signed)
HPI Emily Abbott returns today for followup. She is a very pleasant 72 year old woman with a nonischemic cardiomyopathy, chronic systolic heart failure, status post ICD implantation. She denies chest pain, shortness of breath, but does no peripheral edema at night which gets better than morning. No syncope or recent defibrillator shock. Allergies  Allergen Reactions  . Sulfonamide Derivatives      Current Outpatient Prescriptions  Medication Sig Dispense Refill  . albuterol (PROAIR HFA) 108 (90 BASE) MCG/ACT inhaler UAD as needed       . ALPRAZolam (XANAX) 0.5 MG tablet Take 0.5 mg by mouth 2 (two) times daily as needed.       Marland Kitchen aspirin 325 MG tablet Take 325 mg by mouth daily.        Marland Kitchen BROMDAY 0.09 % SOLN Place 1 drop into the right eye daily. Patient is holding eye drop now until next eye surgery in Aug.      . carvedilol (COREG) 6.25 MG tablet Take 6.25 mg by mouth 2 (two) times daily with a meal.        . cetirizine (ZYRTEC HIVES RELIEF) 10 MG tablet Take 10 mg by mouth as needed.       . Cholecalciferol (VITAMIN D3) 1000 UNITS CAPS Take 1 tablet by mouth daily.        . diphenhydrAMINE (BENADRYL) 25 mg capsule Take 25 mg by mouth at bedtime.        . diphenhydramine-acetaminophen (TYLENOL PM EXTRA STRENGTH) 25-500 MG TABS Take by mouth at bedtime as needed.        . dofetilide (TIKOSYN) 250 MCG capsule Take 1 capsule (250 mcg total) by mouth 2 (two) times daily.  60 capsule  11  . Fluticasone-Salmeterol (ADVAIR DISKUS) 100-50 MCG/DOSE AEPB Inhale 1 puff into the lungs 2 (two) times daily.        Marland Kitchen HYDROcodone-acetaminophen (VICODIN) 5-500 MG per tablet Take 1 tablet by mouth as needed.        . metFORMIN (GLUCOPHAGE) 850 MG tablet Take 850 mg by mouth 2 (two) times daily with a meal.        . niacin (NIASPAN) 750 MG CR tablet Take 750 mg by mouth 2 (two) times daily.        Marland Kitchen omeprazole (PRILOSEC) 40 MG capsule Take 40 mg by mouth daily.        . ONE TOUCH ULTRA TEST test strip       .  PARoxetine (PAXIL) 30 MG tablet Take 30 mg by mouth daily.        . prednisoLONE acetate (PRED FORTE) 1 % ophthalmic suspension Place 1 drop into the right eye daily. Patient will stop this eye drop when bottle it complete and will start retaking after next eye surgery in Aug.      . simvastatin (ZOCOR) 40 MG tablet Take 20 mg by mouth at bedtime.       Marland Kitchen tiotropium (SPIRIVA) 18 MCG inhalation capsule Place 18 mcg into inhaler and inhale as needed.       . warfarin (COUMADIN) 5 MG tablet Use as directed by Bertram Gala, MD       . zolpidem (AMBIEN) 10 MG tablet Take 5 mg by mouth at bedtime as needed.       Marland Kitchen DISCONTD: dofetilide (TIKOSYN) 250 MCG capsule Take 1 capsule (250 mcg total) by mouth 2 (two) times daily.  60 capsule  7     Past Medical History  Diagnosis Date  . Paroxysmal  atrial fibrillation   . CAD (coronary artery disease)   . HTN (hypertension)     essential  . Hyperlipidemia, mixed   . Diabetes mellitus     type II  . Mitral valve disorder   . GERD (gastroesophageal reflux disease)   . Pulmonary embolism   . Anemia   . COPD (chronic obstructive pulmonary disease)   . Osteoarthritis   . Anxiety   . Panic attack     ROS:   All systems reviewed and negative except as noted in the HPI.   Past Surgical History  Procedure Date  . Btl   . Biopsy breast   . Carpal tennel 2003  . Cardiac valve surgery      No family history on file.   History   Social History  . Marital Status: Widowed    Spouse Name: N/A    Number of Children: N/A  . Years of Education: N/A   Occupational History  . Not on file.   Social History Main Topics  . Smoking status: Former Smoker    Types: Cigarettes    Quit date: 02/19/2001  . Smokeless tobacco: Not on file  . Alcohol Use: No  . Drug Use: Not on file  . Sexually Active: Not on file   Other Topics Concern  . Not on file   Social History Narrative  . No narrative on file     BP 114/70  Pulse 72  Ht 5' 6.5"  (1.689 m)  Wt 218 lb 1.9 oz (98.939 kg)  BMI 34.68 kg/m2  Physical Exam:  Well appearing 71 year old woman, NAD HEENT: Unremarkable Neck:  No JVD, no thyromegally Lungs:  Clear with no wheezes, rales, or rhonchi. HEART:  Regular rate rhythm, no murmurs, no rubs, no clicks Abd:  soft, positive bowel sounds, no organomegally, no rebound, no guarding Ext:  2 plus pulses, no edema, no cyanosis, no clubbing Skin:  No rashes no nodules Neuro:  CN II through XII intact, motor grossly intact  DEVICE  Normal device function.  See PaceArt for details.   Assess/Plan:

## 2011-08-10 ENCOUNTER — Encounter: Payer: Self-pay | Admitting: Internal Medicine

## 2011-09-01 ENCOUNTER — Other Ambulatory Visit: Payer: Self-pay | Admitting: Internal Medicine

## 2011-10-11 ENCOUNTER — Encounter: Payer: Self-pay | Admitting: *Deleted

## 2011-10-11 ENCOUNTER — Encounter: Payer: Self-pay | Admitting: Internal Medicine

## 2011-10-11 ENCOUNTER — Ambulatory Visit (INDEPENDENT_AMBULATORY_CARE_PROVIDER_SITE_OTHER): Payer: Medicare Other | Admitting: *Deleted

## 2011-10-11 DIAGNOSIS — Z95810 Presence of automatic (implantable) cardiac defibrillator: Secondary | ICD-10-CM

## 2011-10-11 DIAGNOSIS — I5022 Chronic systolic (congestive) heart failure: Secondary | ICD-10-CM

## 2011-10-11 LAB — REMOTE ICD DEVICE
AL AMPLITUDE: 3.7 mv
AL IMPEDENCE ICD: 390 Ohm
BAMS-0001: 150 {beats}/min
BAMS-0003: 80 {beats}/min
DEVICE MODEL ICD: 752248
HV IMPEDENCE: 68 Ohm
LV LEAD IMPEDENCE ICD: 960 Ohm
MODE SWITCH EPISODES: 2
RV LEAD THRESHOLD: 0.5 V

## 2011-10-12 ENCOUNTER — Encounter: Payer: Self-pay | Admitting: *Deleted

## 2012-01-14 ENCOUNTER — Ambulatory Visit (INDEPENDENT_AMBULATORY_CARE_PROVIDER_SITE_OTHER): Payer: Medicare Other | Admitting: *Deleted

## 2012-01-14 ENCOUNTER — Encounter: Payer: Self-pay | Admitting: Internal Medicine

## 2012-01-14 DIAGNOSIS — I428 Other cardiomyopathies: Secondary | ICD-10-CM

## 2012-01-14 DIAGNOSIS — Z95810 Presence of automatic (implantable) cardiac defibrillator: Secondary | ICD-10-CM

## 2012-01-14 DIAGNOSIS — I5022 Chronic systolic (congestive) heart failure: Secondary | ICD-10-CM

## 2012-01-15 LAB — REMOTE ICD DEVICE
BAMS-0001: 150 {beats}/min
BAMS-0003: 80 {beats}/min
HV IMPEDENCE: 70 Ohm
LV LEAD IMPEDENCE ICD: 930 Ohm
RV LEAD IMPEDENCE ICD: 460 Ohm

## 2012-02-22 ENCOUNTER — Encounter: Payer: Self-pay | Admitting: *Deleted

## 2012-03-16 ENCOUNTER — Other Ambulatory Visit: Payer: Self-pay | Admitting: Internal Medicine

## 2012-04-01 ENCOUNTER — Telehealth: Payer: Self-pay | Admitting: Internal Medicine

## 2012-04-01 NOTE — Telephone Encounter (Signed)
Patient thinks she was shocked by her device.  She will send a remote transmission when she gets home for review.

## 2012-04-01 NOTE — Telephone Encounter (Signed)
New Problem:    Patient called in because her device has gone twice in the past couple of months, last time was 3-4 weeks ago according to the patient's recollection.  Patient would like to consult you to know how to proceed.  Please call back.

## 2012-04-12 ENCOUNTER — Other Ambulatory Visit: Payer: Self-pay | Admitting: Internal Medicine

## 2012-04-21 ENCOUNTER — Encounter: Payer: Self-pay | Admitting: Internal Medicine

## 2012-04-21 ENCOUNTER — Ambulatory Visit (INDEPENDENT_AMBULATORY_CARE_PROVIDER_SITE_OTHER): Payer: Medicare Other | Admitting: *Deleted

## 2012-04-21 ENCOUNTER — Other Ambulatory Visit: Payer: Self-pay | Admitting: Internal Medicine

## 2012-04-21 DIAGNOSIS — Z95810 Presence of automatic (implantable) cardiac defibrillator: Secondary | ICD-10-CM

## 2012-04-21 DIAGNOSIS — I5022 Chronic systolic (congestive) heart failure: Secondary | ICD-10-CM

## 2012-04-22 LAB — REMOTE ICD DEVICE
AL AMPLITUDE: 3.2 mv
BAMS-0003: 80 {beats}/min
DEVICE MODEL ICD: 752248
LV LEAD IMPEDENCE ICD: 930 Ohm
LV LEAD THRESHOLD: 0.75 V
RV LEAD THRESHOLD: 0.5 V

## 2012-04-30 ENCOUNTER — Encounter: Payer: Self-pay | Admitting: *Deleted

## 2012-06-19 ENCOUNTER — Encounter: Payer: Self-pay | Admitting: Cardiology

## 2012-06-19 ENCOUNTER — Ambulatory Visit (INDEPENDENT_AMBULATORY_CARE_PROVIDER_SITE_OTHER): Payer: Medicare Other | Admitting: Cardiology

## 2012-06-19 ENCOUNTER — Encounter: Payer: Self-pay | Admitting: Internal Medicine

## 2012-06-19 VITALS — BP 147/82 | HR 63 | Ht 66.5 in | Wt 224.1 lb

## 2012-06-19 DIAGNOSIS — I428 Other cardiomyopathies: Secondary | ICD-10-CM

## 2012-06-19 DIAGNOSIS — I4891 Unspecified atrial fibrillation: Secondary | ICD-10-CM

## 2012-06-19 DIAGNOSIS — Z95810 Presence of automatic (implantable) cardiac defibrillator: Secondary | ICD-10-CM

## 2012-06-19 DIAGNOSIS — I5022 Chronic systolic (congestive) heart failure: Secondary | ICD-10-CM

## 2012-06-19 MED ORDER — DOFETILIDE 250 MCG PO CAPS: 250.0000 ug | ORAL_CAPSULE | Freq: Two times a day (BID) | ORAL | Status: DC

## 2012-06-19 NOTE — Patient Instructions (Addendum)
Remote monitoring is used to monitor your Pacemaker of ICD from home. This monitoring reduces the number of office visits required to check your device to one time per year. It allows [place] to keep an eye on the functioning of your device to ensure it is working properly. You are scheduled for a device check from home on 09/19/2012. You may send your transmission at any time that day. If you have a wireless device, the transmission will be sent automatically. After your physician reviews your transmission, you will receive a postcard with your next transmission date.  Your physician wants you to follow-up in: 6 months with Dr. Ladona Ridgel. You will receive a reminder letter in the mail two months in advance. If you don't receive a letter, please call our office to schedule the follow-up appointment.  Your physician recommends that you continue on your current medications as directed. Please refer to the Current Medication list given to you today.

## 2012-06-22 ENCOUNTER — Encounter: Payer: Self-pay | Admitting: Cardiology

## 2012-06-22 LAB — ICD DEVICE OBSERVATION
AL AMPLITUDE: 3.5 mv
AL IMPEDENCE ICD: 400 Ohm
AL THRESHOLD: 0.75 V
BAMS-0001: 150 {beats}/min
HV IMPEDENCE: 65 Ohm
LV LEAD IMPEDENCE ICD: 950 Ohm
LV LEAD THRESHOLD: 0.75 V
RV LEAD AMPLITUDE: 11.9 mv
VENTRICULAR PACING ICD: 99 pct
VF: 0

## 2012-06-22 NOTE — Progress Notes (Signed)
ELECTROPHYSIOLOGY OFFICE NOTE  Patient ID: Emily Abbott MRN: [id number], DOB/AGE: February 17, 1941   Date of Visit: 06/22/2012  Primary Physician: Cala Bradford, MD Primary Cardiologist / EP: Ladona Ridgel, MD Reason for Visit: EP/device follow-up  History of Present Illness  Emily Abbott is a 72 year old woman with a NICM, chronic systolic HF, valvular heart disease s/p MV repair, COPD and PAF who presents today for routine electrophysiology followup. Since last being seen in our clinic, she reports she is doing well. She has chronic DOE and feels this may be "a little worse" than her usual although it is not affecting/limiting her usual activities. She denies chest pain or shortness of breath at rest. She denies palpitations, dizziness, near syncope or syncope. She denies LE swelling, orthopnea, PND or recent weight gain. She denies ICD shocks. [name] reports that she is compliant and tolerating medications without difficulty.  Past Medical History Past Medical History  Diagnosis Date  . Paroxysmal atrial fibrillation   . CAD (coronary artery disease)   . HTN (hypertension)     essential  . Hyperlipidemia, mixed   . Diabetes mellitus     type II  . Mitral valve disorder   . GERD (gastroesophageal reflux disease)   . Pulmonary embolism   . Anemia   . COPD (chronic obstructive pulmonary disease)   . Osteoarthritis   . Anxiety   . Panic attack     Past Surgical History Past Surgical History  Procedure Laterality Date  . Btl    . Biopsy breast    . Carpal tennel  2003  . Cardiac valve surgery      Allergies/Intolerances Allergies  Allergen Reactions  . Sulfonamide Derivatives    Current Home Medications Current Outpatient Prescriptions  Medication Sig Dispense Refill  . albuterol (PROAIR HFA) 108 (90 BASE) MCG/ACT inhaler UAD as needed       . ALPRAZolam (XANAX) 0.5 MG tablet Take 0.5 mg by mouth 2 (two) times daily as needed.       Marland Kitchen aspirin 325 MG tablet Take 325 mg by  mouth daily.        . carvedilol (COREG) 6.25 MG tablet Take 6.25 mg by mouth 2 (two) times daily with a meal.        . cetirizine (ZYRTEC HIVES RELIEF) 10 MG tablet Take 10 mg by mouth as needed.       . Cholecalciferol (VITAMIN D3) 1000 UNITS CAPS Take 1 tablet by mouth daily.        . diphenhydrAMINE (BENADRYL) 25 mg capsule Take 25 mg by mouth at bedtime.        . diphenhydramine-acetaminophen (TYLENOL PM EXTRA STRENGTH) 25-500 MG TABS Take by mouth at bedtime as needed.        . dofetilide (TIKOSYN) 250 MCG capsule Take 1 capsule (250 mcg total) by mouth 2 (two) times daily.  60 capsule  11  . dofetilide (TIKOSYN) 250 MCG capsule Take 1 capsule (250 mcg total) by mouth 2 (two) times daily.  60 capsule  6  . Fluticasone-Salmeterol (ADVAIR DISKUS) 100-50 MCG/DOSE AEPB Inhale 1 puff into the lungs 2 (two) times daily.        Marland Kitchen HYDROcodone-acetaminophen (VICODIN) 5-500 MG per tablet Take 1 tablet by mouth as needed.        . metFORMIN (GLUCOPHAGE) 850 MG tablet Take 850 mg by mouth 2 (two) times daily with a meal.        . niacin (NIASPAN) 750 MG  CR tablet Take 750 mg by mouth 2 (two) times daily.        Marland Kitchen omeprazole (PRILOSEC) 40 MG capsule Take 40 mg by mouth daily.        . ONE TOUCH ULTRA TEST test strip       . PARoxetine (PAXIL) 30 MG tablet Take 30 mg by mouth daily.        . simvastatin (ZOCOR) 40 MG tablet Take 20 mg by mouth at bedtime.       Marland Kitchen TIKOSYN 250 MCG capsule TAKE ONE CAPSULE BY MOUTH TWICE A DAY  60 capsule  6  . tiotropium (SPIRIVA) 18 MCG inhalation capsule Place 18 mcg into inhaler and inhale as needed.       . warfarin (COUMADIN) 5 MG tablet Use as directed by Bertram Gala, MD       . zolpidem (AMBIEN) 10 MG tablet Take 5 mg by mouth at bedtime as needed.        No current facility-administered medications for this visit.   Social History Social History  . Marital Status: Widowed   Social History Main Topics  . Smoking status: Former Smoker    Types: Cigarettes      Quit date: 02/19/2001  . Smokeless tobacco: No  . Alcohol Use: No  . Drug Use: No   Review of Systems General: No chills, fever, night sweats or weight changes Cardiovascular: No chest pain, edema, orthopnea, palpitations, paroxysmal nocturnal dyspnea Dermatological: No rash, lesions or masses Respiratory: No cough, dyspnea Urologic: No hematuria, dysuria Abdominal: No nausea, vomiting, diarrhea, bright red blood per rectum, melena, or hematemesis Neurologic: No visual changes, weakness, changes in mental status All other systems reviewed and are otherwise negative except as noted above.  Physical Exam Blood pressure 147/82, pulse 63, height 5' 6.5" (1.689 m), weight 224 lb 1.9 oz (101.66 kg).  General: Well developed, well appearing 72 year old female in no acute distress. HEENT: Normocephalic, atraumatic. EOMs intact. Sclera nonicteric. Oropharynx clear.  Neck: Supple without bruits. No JVD. Lungs: Respirations regular and unlabored, CTA bilaterally. No wheezes, rales or rhonchi. Heart: RRR. S1, S2 present. No murmurs, rub, S3 or S4. Abdomen: Soft, non-distended.  Extremities: No clubbing, cyanosis or edema. PT/Radials 2+ and equal bilaterally. Psych: Normal affect. Neuro: Alert and oriented X 3. Moves all extremities spontaneously.   Diagnostics Device interrogation today - Normal BiV ICD function. Thresholds and sensing consistent with previous device measurements. Lead impedance trends stable over time. No mode switch episodes recorded. No ventricular arrhythmia episodes recorded. Patient bi-ventricularly pacing >99% of the time. Device programmed with appropriate safety margins. Heart failure diagnostics/CorVue reviewed and trends are stable for patient. No changes made this session. Estimated longevity 4.8 years.   Assessment and Plan 1. NICM with LBBB and chronic systolic HF s/p CRT-D Normal device function No programming changes made HF stable; euvolemic by exam CorVue  reviewed and stable Continue medical therapy with carvedilol (has been intolerant to ACEI/ARB in past due to hypotension) Continue routine remote device follow-up every 3 months Return for follow-up with Dr. Ladona Ridgel in 6 months 2. PAF Stable; no episodes on device interrogation today Continue Tikosyn as previously directed by Dr. Ladona Ridgel Continue warfarin for embolic prophylaxis  Signed, Janice Bodine, PA-C 06/22/2012, 10:21 AM

## 2012-09-22 ENCOUNTER — Ambulatory Visit (INDEPENDENT_AMBULATORY_CARE_PROVIDER_SITE_OTHER): Payer: Medicare Other | Admitting: *Deleted

## 2012-09-22 ENCOUNTER — Encounter: Payer: Self-pay | Admitting: Internal Medicine

## 2012-09-22 DIAGNOSIS — I5022 Chronic systolic (congestive) heart failure: Secondary | ICD-10-CM

## 2012-09-22 DIAGNOSIS — Z95810 Presence of automatic (implantable) cardiac defibrillator: Secondary | ICD-10-CM

## 2012-09-23 LAB — REMOTE ICD DEVICE
AL AMPLITUDE: 3.4 mv
BAMS-0003: 80 {beats}/min
DEVICE MODEL ICD: 752248
LV LEAD IMPEDENCE ICD: 910 Ohm
MODE SWITCH EPISODES: 327
RV LEAD AMPLITUDE: 12 mv
RV LEAD THRESHOLD: 0.625 V
VENTRICULAR PACING ICD: 99 pct

## 2012-10-09 ENCOUNTER — Other Ambulatory Visit: Payer: Self-pay | Admitting: Internal Medicine

## 2012-10-10 ENCOUNTER — Encounter: Payer: Self-pay | Admitting: *Deleted

## 2012-12-22 ENCOUNTER — Ambulatory Visit (INDEPENDENT_AMBULATORY_CARE_PROVIDER_SITE_OTHER): Payer: Medicare Other | Admitting: *Deleted

## 2012-12-22 ENCOUNTER — Encounter: Payer: Self-pay | Admitting: Internal Medicine

## 2012-12-22 DIAGNOSIS — I428 Other cardiomyopathies: Secondary | ICD-10-CM

## 2012-12-22 DIAGNOSIS — I5022 Chronic systolic (congestive) heart failure: Secondary | ICD-10-CM

## 2012-12-22 DIAGNOSIS — I4891 Unspecified atrial fibrillation: Secondary | ICD-10-CM

## 2012-12-23 LAB — REMOTE ICD DEVICE
AL IMPEDENCE ICD: 360 Ohm
BAMS-0001: 150 {beats}/min
BAMS-0003: 80 {beats}/min
LV LEAD IMPEDENCE ICD: 940 Ohm
LV LEAD THRESHOLD: 0.75 V
RV LEAD THRESHOLD: 0.625 V

## 2012-12-25 NOTE — Progress Notes (Signed)
ICD remote with ICM 

## 2012-12-26 ENCOUNTER — Encounter: Payer: Self-pay | Admitting: *Deleted

## 2013-03-24 ENCOUNTER — Ambulatory Visit
Admission: RE | Admit: 2013-03-24 | Discharge: 2013-03-24 | Disposition: A | Discharge status: Home / Self Care | Payer: Medicare Other | Source: Ambulatory Visit | Attending: Family Medicine | Admitting: Family Medicine

## 2013-03-24 ENCOUNTER — Other Ambulatory Visit: Payer: Self-pay | Admitting: Family Medicine

## 2013-03-24 DIAGNOSIS — J329 Chronic sinusitis, unspecified: Secondary | ICD-10-CM

## 2013-03-24 DIAGNOSIS — R059 Cough, unspecified: Secondary | ICD-10-CM

## 2013-03-24 DIAGNOSIS — R05 Cough: Secondary | ICD-10-CM

## 2013-03-25 ENCOUNTER — Encounter: Payer: Self-pay | Admitting: Internal Medicine

## 2013-03-25 ENCOUNTER — Ambulatory Visit (INDEPENDENT_AMBULATORY_CARE_PROVIDER_SITE_OTHER): Payer: Medicare Other | Admitting: *Deleted

## 2013-03-25 DIAGNOSIS — I4891 Unspecified atrial fibrillation: Secondary | ICD-10-CM

## 2013-03-25 LAB — MDC_IDC_ENUM_SESS_TYPE_REMOTE
Battery Remaining Percentage: 52 %
Battery Voltage: 2.92 V
Brady Statistic AP VP Percent: 25 %
Brady Statistic AS VP Percent: 74 %
Brady Statistic RA Percent Paced: 23 %
HIGH POWER IMPEDANCE MEASURED VALUE: 69 Ohm
HIGH POWER IMPEDANCE MEASURED VALUE: 69 Ohm
Implantable Pulse Generator Serial Number: 752248
Lead Channel Impedance Value: 380 Ohm
Lead Channel Impedance Value: 930 Ohm
Lead Channel Pacing Threshold Amplitude: 0.625 V
Lead Channel Pacing Threshold Amplitude: 0.75 V
Lead Channel Pacing Threshold Pulse Width: 0.5 ms
Lead Channel Setting Pacing Amplitude: 2 V
Lead Channel Setting Pacing Amplitude: 2 V
Lead Channel Setting Pacing Pulse Width: 0.5 ms
Lead Channel Setting Pacing Pulse Width: 0.5 ms
MDC IDC MSMT BATTERY REMAINING LONGEVITY: 49 mo
MDC IDC MSMT LEADCHNL LV PACING THRESHOLD PULSEWIDTH: 0.5 ms
MDC IDC MSMT LEADCHNL RA PACING THRESHOLD AMPLITUDE: 0.75 V
MDC IDC MSMT LEADCHNL RA PACING THRESHOLD PULSEWIDTH: 0.5 ms
MDC IDC MSMT LEADCHNL RA SENSING INTR AMPL: 3 mV
MDC IDC MSMT LEADCHNL RV IMPEDANCE VALUE: 530 Ohm
MDC IDC MSMT LEADCHNL RV SENSING INTR AMPL: 12 mV
MDC IDC SESS DTM: 20150204073305
MDC IDC SET LEADCHNL RV PACING AMPLITUDE: 2 V
MDC IDC SET LEADCHNL RV SENSING SENSITIVITY: 0.5 mV
MDC IDC SET ZONE DETECTION INTERVAL: 320 ms
MDC IDC STAT BRADY AP VS PERCENT: 1 %
MDC IDC STAT BRADY AS VS PERCENT: 1 %

## 2013-04-14 ENCOUNTER — Encounter: Payer: Self-pay | Admitting: *Deleted

## 2013-04-27 ENCOUNTER — Other Ambulatory Visit: Payer: Self-pay

## 2013-05-08 ENCOUNTER — Other Ambulatory Visit: Payer: Self-pay | Admitting: Internal Medicine

## 2013-06-25 ENCOUNTER — Encounter: Payer: Medicare Other | Admitting: Internal Medicine

## 2013-06-26 ENCOUNTER — Encounter: Payer: Self-pay | Admitting: Internal Medicine

## 2013-07-16 ENCOUNTER — Ambulatory Visit (INDEPENDENT_AMBULATORY_CARE_PROVIDER_SITE_OTHER): Payer: Medicare Other | Admitting: Internal Medicine

## 2013-07-16 ENCOUNTER — Encounter: Payer: Self-pay | Admitting: Internal Medicine

## 2013-07-16 VITALS — BP 182/90 | HR 74 | Ht 66.5 in | Wt 219.0 lb

## 2013-07-16 DIAGNOSIS — I5022 Chronic systolic (congestive) heart failure: Secondary | ICD-10-CM

## 2013-07-16 DIAGNOSIS — I428 Other cardiomyopathies: Secondary | ICD-10-CM

## 2013-07-16 DIAGNOSIS — I4891 Unspecified atrial fibrillation: Secondary | ICD-10-CM

## 2013-07-16 DIAGNOSIS — Z95810 Presence of automatic (implantable) cardiac defibrillator: Secondary | ICD-10-CM

## 2013-07-16 LAB — MDC_IDC_ENUM_SESS_TYPE_INCLINIC
Brady Statistic RA Percent Paced: 22 %
HighPow Impedance: 66 Ohm
HighPow Impedance: 66.375
Implantable Pulse Generator Serial Number: 752248
Lead Channel Impedance Value: 375 Ohm
Lead Channel Impedance Value: 462.5 Ohm
Lead Channel Pacing Threshold Pulse Width: 0.5 ms
Lead Channel Pacing Threshold Pulse Width: 0.5 ms
Lead Channel Sensing Intrinsic Amplitude: 12 mV
Lead Channel Setting Pacing Amplitude: 2 V
Lead Channel Setting Pacing Pulse Width: 0.5 ms
Lead Channel Setting Pacing Pulse Width: 0.5 ms
Lead Channel Setting Sensing Sensitivity: 0.5 mV
MDC IDC MSMT BATTERY REMAINING LONGEVITY: 45.6 mo
MDC IDC MSMT LEADCHNL LV IMPEDANCE VALUE: 975 Ohm
MDC IDC MSMT LEADCHNL LV PACING THRESHOLD AMPLITUDE: 0.75 V
MDC IDC MSMT LEADCHNL RA PACING THRESHOLD AMPLITUDE: 0.75 V
MDC IDC MSMT LEADCHNL RA PACING THRESHOLD AMPLITUDE: 0.75 V
MDC IDC MSMT LEADCHNL RA PACING THRESHOLD PULSEWIDTH: 0.5 ms
MDC IDC MSMT LEADCHNL RA SENSING INTR AMPL: 3.4 mV
MDC IDC MSMT LEADCHNL RV PACING THRESHOLD AMPLITUDE: 0.625 V
MDC IDC MSMT LEADCHNL RV PACING THRESHOLD PULSEWIDTH: 0.5 ms
MDC IDC SESS DTM: 20150528171954
MDC IDC SET LEADCHNL LV PACING AMPLITUDE: 2 V
MDC IDC SET LEADCHNL RV PACING AMPLITUDE: 2 V
MDC IDC SET ZONE DETECTION INTERVAL: 320 ms
MDC IDC STAT BRADY RV PERCENT PACED: 98 %

## 2013-07-16 MED ORDER — FUROSEMIDE 20 MG PO TABS: 20.0000 mg | ORAL_TABLET | Freq: Every day | ORAL | Status: DC

## 2013-07-16 NOTE — Patient Instructions (Addendum)
Your physician wants you to follow-up in: 12 months with Dr Knox Saliva will receive a reminder letter in the mail two months in advance. If you don't receive a letter, please call our office to schedule the follow-up appointment.  Remote monitoring is used to monitor your Pacemaker of ICD from home. This monitoring reduces the number of office visits required to check your device to one time per year. It allows [place] to keep an eye on the functioning of your device to ensure it is working properly. You are scheduled for a device check from home on 10/19/13. You may send your transmission at any time that day. If you have a wireless device, the transmission will be sent automatically. After your physician reviews your transmission, you will receive a postcard with your next transmission date.  Your physician has recommended you make the following change in your medication:  1) start Furosemide to 20mg    Your physician has requested that you have an echocardiogram. Echocardiography is a painless test that uses sound waves to create images of your heart. It provides your doctor with information about the size and shape of your heart and how well your heart's chambers and valves are working. This procedure takes approximately one hour. There are no restrictions for this procedure.  Your physician recommends that you return for lab work same day as Echo--BMP

## 2013-07-16 NOTE — Assessment & Plan Note (Signed)
She is maintaining NSR about 99% of the time. She will continue her tikosyn.

## 2013-07-16 NOTE — Assessment & Plan Note (Addendum)
Her symptoms are class 2. She has periods where her fluid status worsens. She is encouraged to reduce her sodium intake. She will take additional lasix. She has been asked to weigh herself daily. She will undergo 2D echo.

## 2013-07-16 NOTE — Progress Notes (Signed)
HPI Emily Abbott returns after a long absence from our arrhythmia clinic. She is a pleasant 73 yo woman with a h/o CAD, s/p MI, chronic systolic heart failure, s/p BiV ICD. She has been stable She has occaisional palpitations. She notes dyspnea with exertion. Mild peripheral edema which she treats with as needed diuretic therapy. No ICD shocks.  Allergies  Allergen Reactions  . Sulfonamide Derivatives      Current Outpatient Prescriptions  Medication Sig Dispense Refill  . albuterol (PROAIR HFA) 108 (90 BASE) MCG/ACT inhaler Use as directed as needed      . ALPRAZolam (XANAX) 0.5 MG tablet Take 0.5 mg by mouth 2 (two) times daily as needed.       Marland Kitchen aspirin 325 MG tablet Take 325 mg by mouth daily.        . carvedilol (COREG) 6.25 MG tablet Take 6.25 mg by mouth 2 (two) times daily with a meal.        . cetirizine (ZYRTEC HIVES RELIEF) 10 MG tablet Take 10 mg by mouth as needed.       . Cholecalciferol (VITAMIN D3) 1000 UNITS CAPS Take 1 tablet by mouth daily.        . diphenhydrAMINE (BENADRYL) 25 mg capsule Take 25 mg by mouth at bedtime.        . dofetilide (TIKOSYN) 250 MCG capsule Take 1 capsule (250 mcg total) by mouth 2 (two) times daily.  60 capsule  6  . Fluticasone-Salmeterol (ADVAIR DISKUS) 100-50 MCG/DOSE AEPB Inhale 1 puff into the lungs 2 (two) times daily.        Marland Kitchen HYDROcodone-acetaminophen (VICODIN) 5-500 MG per tablet Take 1 tablet by mouth as needed.        . metFORMIN (GLUCOPHAGE) 850 MG tablet Take 850 mg by mouth 2 (two) times daily with a meal.        . omeprazole (PRILOSEC) 40 MG capsule Take 40 mg by mouth daily.        Marland Kitchen PARoxetine (PAXIL) 30 MG tablet Take 30 mg by mouth daily.        . simvastatin (ZOCOR) 40 MG tablet Take 20 mg by mouth at bedtime.       Marland Kitchen tiotropium (SPIRIVA) 18 MCG inhalation capsule Place 18 mcg into inhaler and inhale as needed.       . warfarin (COUMADIN) 5 MG tablet Use as directed by Alta Corning, MD       . zolpidem (AMBIEN) 10 MG  tablet Take 5 mg by mouth at bedtime as needed.       . furosemide (LASIX) 20 MG tablet Take 1 tablet (20 mg total) by mouth daily.  90 tablet  3   No current facility-administered medications for this visit.     Past Medical History  Diagnosis Date  . Paroxysmal atrial fibrillation   . CAD (coronary artery disease)   . HTN (hypertension)     essential  . Hyperlipidemia, mixed   . Diabetes mellitus     type II  . Mitral valve disorder   . GERD (gastroesophageal reflux disease)   . Pulmonary embolism   . Anemia   . COPD (chronic obstructive pulmonary disease)   . Osteoarthritis   . Anxiety   . Panic attack     ROS:   All systems reviewed and negative except as noted in the HPI.   Past Surgical History  Procedure Laterality Date  . Btl    . Biopsy breast    .  Carpal tennel  2003  . Cardiac valve surgery       No family history on file.   History   Social History  . Marital Status: Widowed    Spouse Name: N/A    Number of Children: N/A  . Years of Education: N/A   Occupational History  . Not on file.   Social History Main Topics  . Smoking status: Former Smoker    Types: Cigarettes    Quit date: 02/19/2001  . Smokeless tobacco: Not on file  . Alcohol Use: No  . Drug Use: Not on file  . Sexual Activity: Not on file   Other Topics Concern  . Not on file   Social History Narrative  . No narrative on file     BP 182/90  Pulse 74  Ht 5' 6.5" (1.689 m)  Wt 219 lb (99.338 kg)  BMI 34.82 kg/m2  Physical Exam:  Well appearing 73 yo woman, NAD HEENT: Unremarkable Neck:  No JVD, no thyromegally Back:  No CVA tenderness Lungs:  Clear with rales in the bases. HEART:  Regular rate rhythm, no murmurs, no rubs, no clicks Abd:  soft, positive bowel sounds, no organomegally, no rebound, no guarding Ext:  2 plus pulses, trace edema, no cyanosis, no clubbing Skin:  No rashes no nodules Neuro:  CN II through XII intact, motor grossly intact  EKG - nsr  with BiV pacing  DEVICE  Normal device function.  See PaceArt for details.   Assess/Plan:

## 2013-07-16 NOTE — Assessment & Plan Note (Signed)
Her [employer] BiV ICD is working normally. Will recheck in several months.  

## 2013-07-21 ENCOUNTER — Other Ambulatory Visit: Payer: Self-pay | Admitting: Internal Medicine

## 2013-07-27 ENCOUNTER — Ambulatory Visit (HOSPITAL_COMMUNITY): Payer: Medicare Other | Attending: Cardiology | Admitting: Cardiology

## 2013-07-27 DIAGNOSIS — I428 Other cardiomyopathies: Secondary | ICD-10-CM

## 2013-07-27 DIAGNOSIS — I5022 Chronic systolic (congestive) heart failure: Secondary | ICD-10-CM

## 2013-07-27 DIAGNOSIS — I4891 Unspecified atrial fibrillation: Secondary | ICD-10-CM | POA: Insufficient documentation

## 2013-07-27 NOTE — Progress Notes (Signed)
Echo performed. 

## 2013-09-30 ENCOUNTER — Telehealth: Payer: Self-pay | Admitting: Cardiology

## 2013-09-30 NOTE — Telephone Encounter (Signed)
Pt called and stated that she felt her defibrillator go off this morning. Pt advised to send a manual transmission. Called pt after receiving transmission. PT states that she is sore in her chest and her back. I informed pt that there was no shock delivered. Pt stated that there must be something wrong b/c she received a shock and that it woke her up. Pt refuses to believe that there was no shock and no episodes.

## 2013-09-30 NOTE — Telephone Encounter (Signed)
Appt made

## 2013-10-08 ENCOUNTER — Ambulatory Visit (INDEPENDENT_AMBULATORY_CARE_PROVIDER_SITE_OTHER): Payer: Medicare Other | Admitting: *Deleted

## 2013-10-08 DIAGNOSIS — I5022 Chronic systolic (congestive) heart failure: Secondary | ICD-10-CM

## 2013-10-08 LAB — MDC_IDC_ENUM_SESS_TYPE_INCLINIC
Battery Remaining Longevity: 43.2 mo
Brady Statistic RV Percent Paced: 97 %
HighPow Impedance: 68.625
Implantable Pulse Generator Serial Number: 752248
Lead Channel Impedance Value: 375 Ohm
Lead Channel Impedance Value: 487.5 Ohm
Lead Channel Pacing Threshold Amplitude: 0.625 V
Lead Channel Pacing Threshold Amplitude: 0.75 V
Lead Channel Pacing Threshold Amplitude: 0.75 V
Lead Channel Pacing Threshold Pulse Width: 0.5 ms
Lead Channel Pacing Threshold Pulse Width: 0.5 ms
Lead Channel Sensing Intrinsic Amplitude: 4.2 mV
Lead Channel Setting Pacing Amplitude: 2 V
Lead Channel Setting Sensing Sensitivity: 0.5 mV
MDC IDC MSMT LEADCHNL LV IMPEDANCE VALUE: 962.5 Ohm
MDC IDC MSMT LEADCHNL RA PACING THRESHOLD PULSEWIDTH: 0.5 ms
MDC IDC MSMT LEADCHNL RA PACING THRESHOLD PULSEWIDTH: 0.5 ms
MDC IDC MSMT LEADCHNL RV PACING THRESHOLD AMPLITUDE: 0.5 V
MDC IDC MSMT LEADCHNL RV SENSING INTR AMPL: 12 mV
MDC IDC SESS DTM: 20150820125207
MDC IDC SET LEADCHNL LV PACING AMPLITUDE: 2 V
MDC IDC SET LEADCHNL LV PACING PULSEWIDTH: 0.5 ms
MDC IDC SET LEADCHNL RV PACING AMPLITUDE: 2 V
MDC IDC SET LEADCHNL RV PACING PULSEWIDTH: 0.5 ms
MDC IDC SET ZONE DETECTION INTERVAL: 320 ms
MDC IDC STAT BRADY RA PERCENT PACED: 21 %

## 2013-10-08 NOTE — Progress Notes (Signed)
CRT-D device check in office. Thresholds and sensing consistent with previous device measurements. Lead impedance trends stable over time. 3% A-fib/flutter, + coumadin.   No ventricular arrhythmia episodes recorded. Patient bi-ventricularly pacing >97 % of the time. Device programmed with appropriate safety margins. Heart failure diagnostics reviewed and trends are stable for patient. Audible/vibratory alerts demonstrated for patient. No changes made this session. Estimated longevity 3.6 years.  Patient enrolled in remote follow up. Plan to check device remotely in 3 months and see in office in 6 months. Patient education completed including shock plan.  The patient was seen today for concerns that she was "being shocked".  The patient was reassurred that no therapy was delivered and we will follow up as scheduled.

## 2013-10-26 ENCOUNTER — Encounter: Payer: Self-pay | Admitting: Internal Medicine

## 2013-11-26 ENCOUNTER — Encounter: Payer: Self-pay | Admitting: Internal Medicine

## 2014-01-05 ENCOUNTER — Encounter: Payer: Self-pay | Admitting: Internal Medicine

## 2014-01-11 ENCOUNTER — Encounter: Payer: Self-pay | Admitting: Internal Medicine

## 2014-01-11 ENCOUNTER — Ambulatory Visit (INDEPENDENT_AMBULATORY_CARE_PROVIDER_SITE_OTHER): Payer: Medicare Other | Admitting: *Deleted

## 2014-01-11 DIAGNOSIS — I428 Other cardiomyopathies: Secondary | ICD-10-CM

## 2014-01-11 DIAGNOSIS — I429 Cardiomyopathy, unspecified: Secondary | ICD-10-CM

## 2014-01-11 LAB — MDC_IDC_ENUM_SESS_TYPE_REMOTE
Battery Remaining Longevity: 40 mo
Battery Remaining Percentage: 42 %
Brady Statistic AP VS Percent: 1 %
Brady Statistic AS VS Percent: 1 %
Brady Statistic RA Percent Paced: 20 %
Date Time Interrogation Session: 20151123071746
HIGH POWER IMPEDANCE MEASURED VALUE: 71 Ohm
HighPow Impedance: 71 Ohm
Lead Channel Impedance Value: 360 Ohm
Lead Channel Impedance Value: 930 Ohm
Lead Channel Pacing Threshold Amplitude: 0.625 V
Lead Channel Pacing Threshold Amplitude: 0.75 V
Lead Channel Pacing Threshold Pulse Width: 0.5 ms
Lead Channel Sensing Intrinsic Amplitude: 12 mV
Lead Channel Sensing Intrinsic Amplitude: 2.9 mV
Lead Channel Setting Pacing Amplitude: 2 V
Lead Channel Setting Pacing Amplitude: 2 V
Lead Channel Setting Pacing Pulse Width: 0.5 ms
MDC IDC MSMT BATTERY VOLTAGE: 2.9 V
MDC IDC MSMT LEADCHNL LV PACING THRESHOLD AMPLITUDE: 0.875 V
MDC IDC MSMT LEADCHNL LV PACING THRESHOLD PULSEWIDTH: 0.5 ms
MDC IDC MSMT LEADCHNL RV IMPEDANCE VALUE: 450 Ohm
MDC IDC MSMT LEADCHNL RV PACING THRESHOLD PULSEWIDTH: 0.5 ms
MDC IDC PG SERIAL: 752248
MDC IDC SET LEADCHNL LV PACING AMPLITUDE: 2 V
MDC IDC SET LEADCHNL LV PACING PULSEWIDTH: 0.5 ms
MDC IDC SET LEADCHNL RV SENSING SENSITIVITY: 0.5 mV
MDC IDC STAT BRADY AP VP PERCENT: 24 %
MDC IDC STAT BRADY AS VP PERCENT: 76 %
Zone Setting Detection Interval: 320 ms

## 2014-01-11 NOTE — Progress Notes (Signed)
Remote ICD transmission.   

## 2014-02-11 ENCOUNTER — Other Ambulatory Visit: Payer: Self-pay | Admitting: Internal Medicine

## 2014-03-02 ENCOUNTER — Encounter: Payer: Self-pay | Admitting: Internal Medicine

## 2014-03-24 ENCOUNTER — Encounter: Payer: Self-pay | Admitting: Internal Medicine

## 2014-04-14 ENCOUNTER — Encounter: Payer: Medicare Other | Admitting: *Deleted

## 2014-04-14 ENCOUNTER — Telehealth: Payer: Self-pay | Admitting: Cardiology

## 2014-04-14 NOTE — Telephone Encounter (Signed)
Spoke with pt and reminded pt of remote transmission that is due today. Pt verbalized understanding.   

## 2014-04-15 ENCOUNTER — Encounter: Payer: Self-pay | Admitting: Cardiology

## 2014-05-11 ENCOUNTER — Encounter: Payer: Self-pay | Admitting: Internal Medicine

## 2014-07-06 ENCOUNTER — Encounter: Payer: Self-pay | Admitting: Internal Medicine

## 2014-07-06 ENCOUNTER — Ambulatory Visit (INDEPENDENT_AMBULATORY_CARE_PROVIDER_SITE_OTHER): Payer: Medicare Other | Admitting: Internal Medicine

## 2014-07-06 VITALS — BP 188/92 | HR 78 | Ht 66.5 in | Wt 216.2 lb

## 2014-07-06 DIAGNOSIS — Z95810 Presence of automatic (implantable) cardiac defibrillator: Secondary | ICD-10-CM | POA: Diagnosis not present

## 2014-07-06 DIAGNOSIS — I428 Other cardiomyopathies: Secondary | ICD-10-CM

## 2014-07-06 DIAGNOSIS — I2510 Atherosclerotic heart disease of native coronary artery without angina pectoris: Secondary | ICD-10-CM

## 2014-07-06 DIAGNOSIS — I10 Essential (primary) hypertension: Secondary | ICD-10-CM

## 2014-07-06 DIAGNOSIS — I4891 Unspecified atrial fibrillation: Secondary | ICD-10-CM

## 2014-07-06 DIAGNOSIS — I5022 Chronic systolic (congestive) heart failure: Secondary | ICD-10-CM | POA: Diagnosis not present

## 2014-07-06 DIAGNOSIS — I429 Cardiomyopathy, unspecified: Secondary | ICD-10-CM

## 2014-07-06 LAB — CUP PACEART INCLINIC DEVICE CHECK
Battery Remaining Longevity: 34.8 mo
Brady Statistic RA Percent Paced: 19 %
Date Time Interrogation Session: 20160517143302
HIGH POWER IMPEDANCE MEASURED VALUE: 57.375
Lead Channel Pacing Threshold Amplitude: 0.75 V
Lead Channel Pacing Threshold Amplitude: 1 V
Lead Channel Pacing Threshold Amplitude: 1 V
Lead Channel Pacing Threshold Amplitude: 1 V
Lead Channel Pacing Threshold Pulse Width: 0.5 ms
Lead Channel Pacing Threshold Pulse Width: 0.5 ms
Lead Channel Pacing Threshold Pulse Width: 0.5 ms
Lead Channel Sensing Intrinsic Amplitude: 12 mV
Lead Channel Sensing Intrinsic Amplitude: 2.7 mV
Lead Channel Setting Pacing Amplitude: 2 V
Lead Channel Setting Pacing Amplitude: 2 V
Lead Channel Setting Pacing Amplitude: 2 V
Lead Channel Setting Pacing Pulse Width: 0.5 ms
Lead Channel Setting Pacing Pulse Width: 0.5 ms
Lead Channel Setting Sensing Sensitivity: 0.5 mV
MDC IDC MSMT LEADCHNL LV IMPEDANCE VALUE: 975 Ohm
MDC IDC MSMT LEADCHNL LV PACING THRESHOLD PULSEWIDTH: 0.5 ms
MDC IDC MSMT LEADCHNL RA IMPEDANCE VALUE: 387.5 Ohm
MDC IDC MSMT LEADCHNL RV IMPEDANCE VALUE: 462.5 Ohm
MDC IDC PG SERIAL: 752248
MDC IDC SET ZONE DETECTION INTERVAL: 320 ms
MDC IDC STAT BRADY RV PERCENT PACED: 93 %

## 2014-07-06 MED ORDER — CARVEDILOL 6.25 MG PO TABS: 9.3750 mg | ORAL_TABLET | Freq: Two times a day (BID) | ORAL | Status: DC

## 2014-07-06 MED ORDER — FUROSEMIDE 20 MG PO TABS: 40.0000 mg | ORAL_TABLET | Freq: Every day | ORAL | Status: DC

## 2014-07-06 NOTE — Patient Instructions (Addendum)
Medication Instructions:  Your physician has recommended you make the following change in your medication:  1) Increase Carvedilol to 9.375 mg twice daily (1 1/2 tablet twice daily) 2) Increase Furosemide to 40 mg daily   Labwork: None ordered  Testing/Procedures: None ordered  Follow-Up: Your physician wants you to follow-up in: 12 months with Dr Knox Saliva will receive a reminder letter in the mail two months in advance. If you don't receive a letter, please call our office to schedule the follow-up appointment.   Remote monitoring is used to monitor your Pacemaker or ICD from home. This monitoring reduces the number of office visits required to check your device to one time per year. It allows [place] to keep an eye on the functioning of your device to ensure it is working properly. You are scheduled for a device check from home on 10/05/14. You may send your transmission at any time that day. If you have a wireless device, the transmission will be sent automatically. After your physician reviews your transmission, you will receive a postcard with your next transmission date.    Any Other Special Instructions Will Be Listed Below (If Applicable).

## 2014-07-06 NOTE — Assessment & Plan Note (Signed)
Her blood pressure is elevated. She will continue her current meds and I have asked that she uptitrate her coreg and lasix.

## 2014-07-06 NOTE — Assessment & Plan Note (Signed)
Her biv ICD is working normally. Will recheck in several months.

## 2014-07-06 NOTE — Assessment & Plan Note (Signed)
She denies anginal symptoms. Will continue his current meds.

## 2014-07-06 NOTE — Assessment & Plan Note (Signed)
Her symptoms are class 2B. She will reduce her sodium intake and uptitrate her medications as tolerated. Will increase lasix to 40 a day and coreg to 9.375 bid.

## 2014-07-06 NOTE — Progress Notes (Signed)
HPI Emily Abbott returns after a long absence from our arrhythmia clinic. She is a pleasant 74 yo woman with a h/o CAD, s/p MI, chronic systolic heart failure, s/p BiV ICD. She has been stable She has occaisional palpitations. She notes dyspnea with exertion. Mild peripheral edema which she treats with as needed diuretic therapy. No ICD shocks. Her blood pressure has not been well controlled.  Allergies  Allergen Reactions  . Sulfonamide Derivatives      Current Outpatient Prescriptions  Medication Sig Dispense Refill  . albuterol (PROAIR HFA) 108 (90 BASE) MCG/ACT inhaler Use as directed as needed    . ALPRAZolam (XANAX) 0.5 MG tablet Take 0.5 mg by mouth 2 (two) times daily as needed.     Marland Kitchen aspirin 325 MG tablet Take 325 mg by mouth daily.      . carvedilol (COREG) 6.25 MG tablet Take 6.25 mg by mouth 2 (two) times daily with a meal.      . diphenhydrAMINE (BENADRYL) 25 mg capsule Take 25 mg by mouth at bedtime.      . Fluticasone-Salmeterol (ADVAIR DISKUS) 100-50 MCG/DOSE AEPB Inhale 1 puff into the lungs 2 (two) times daily.      . furosemide (LASIX) 20 MG tablet Take 1 tablet (20 mg total) by mouth daily. 90 tablet 3  . HYDROcodone-acetaminophen (VICODIN) 5-500 MG per tablet Take 1 tablet by mouth as needed.      . metFORMIN (GLUCOPHAGE) 850 MG tablet Take 850 mg by mouth 2 (two) times daily with a meal.      . omeprazole (PRILOSEC) 40 MG capsule Take 40 mg by mouth daily.      Marland Kitchen PARoxetine (PAXIL) 30 MG tablet Take 30 mg by mouth daily.      . simvastatin (ZOCOR) 40 MG tablet Take 20 mg by mouth at bedtime.     Marland Kitchen TIKOSYN 250 MCG capsule TAKE 1 CAPSULE BY MOUTH TWICE DAILY 60 capsule 5  . warfarin (COUMADIN) 5 MG tablet Use as directed by Alta Corning, MD     . zolpidem (AMBIEN) 10 MG tablet Take 5 mg by mouth at bedtime as needed.      No current facility-administered medications for this visit.     Past Medical History  Diagnosis Date  . Paroxysmal atrial fibrillation    . CAD (coronary artery disease)   . HTN (hypertension)     essential  . Hyperlipidemia, mixed   . Diabetes mellitus     type II  . Mitral valve disorder   . GERD (gastroesophageal reflux disease)   . Pulmonary embolism   . Anemia   . COPD (chronic obstructive pulmonary disease)   . Osteoarthritis   . Anxiety   . Panic attack     ROS:   All systems reviewed and negative except as noted in the HPI.   Past Surgical History  Procedure Laterality Date  . Btl    . Biopsy breast    . Carpal tennel  2003  . Cardiac valve surgery       Family History  Problem Relation Age of Onset  . Stroke Mother   . Anuerysm Father   . Diabetes Sister   . Cancer Brother   . Ulcers Sister      History   Social History  . Marital Status: Widowed    Spouse Name: N/A  . Number of Children: N/A  . Years of Education: N/A   Occupational History  . Not  on file.   Social History Main Topics  . Smoking status: Former Smoker    Types: Cigarettes    Quit date: 02/19/2001  . Smokeless tobacco: Not on file  . Alcohol Use: No  . Drug Use: Not on file  . Sexual Activity: Not on file   Other Topics Concern  . Not on file   Social History Narrative     BP 188/92 mmHg  Pulse 78  Ht 5' 6.5" (1.689 m)  Wt 216 lb 3.2 oz (98.068 kg)  BMI 34.38 kg/m2 BP 165/100 by me Physical Exam:  Well appearing 74 yo woman, NAD HEENT: Unremarkable Neck:  7 cm JVD, no thyromegally Back:  No CVA tenderness Lungs:  Clear with rales in the bases. HEART:  Regular rate rhythm, no murmurs, no rubs, no clicks Abd:  soft, positive bowel sounds, no organomegally, no rebound, no guarding Ext:  2 plus pulses, trace edema, no cyanosis, no clubbing Skin:  No rashes no nodules Neuro:  CN II through XII intact, motor grossly intact  EKG - nsr with BiV pacing  DEVICE  Normal device function.  See PaceArt for details.   Assess/Plan:

## 2014-07-12 ENCOUNTER — Other Ambulatory Visit: Payer: Self-pay | Admitting: Internal Medicine

## 2014-07-13 NOTE — Telephone Encounter (Signed)
Per note 5.17.16

## 2014-07-15 ENCOUNTER — Other Ambulatory Visit: Payer: Self-pay | Admitting: Internal Medicine

## 2014-10-05 ENCOUNTER — Encounter: Payer: Medicare Other | Admitting: *Deleted

## 2014-10-05 ENCOUNTER — Telehealth: Payer: Self-pay | Admitting: Cardiology

## 2014-10-05 ENCOUNTER — Ambulatory Visit (INDEPENDENT_AMBULATORY_CARE_PROVIDER_SITE_OTHER): Payer: Medicare Other | Admitting: *Deleted

## 2014-10-05 DIAGNOSIS — I429 Cardiomyopathy, unspecified: Secondary | ICD-10-CM

## 2014-10-05 DIAGNOSIS — I428 Other cardiomyopathies: Secondary | ICD-10-CM

## 2014-10-05 NOTE — Telephone Encounter (Signed)
Spoke with pt and reminded pt of remote transmission that is due today. Pt verbalized understanding.   

## 2014-10-06 ENCOUNTER — Encounter: Payer: Self-pay | Admitting: Cardiology

## 2014-11-01 NOTE — Progress Notes (Signed)
Remote ICD transmission.   

## 2014-11-02 LAB — CUP PACEART REMOTE DEVICE CHECK
Battery Remaining Longevity: 32 mo
Battery Remaining Percentage: 35 %
Battery Voltage: 2.89 V
Brady Statistic AP VP Percent: 34 %
Brady Statistic AS VP Percent: 66 %
Brady Statistic AS VS Percent: 1 %
Brady Statistic RA Percent Paced: 30 %
Date Time Interrogation Session: 20160816170507
HIGH POWER IMPEDANCE MEASURED VALUE: 63 Ohm
HighPow Impedance: 63 Ohm
Lead Channel Impedance Value: 380 Ohm
Lead Channel Impedance Value: 510 Ohm
Lead Channel Impedance Value: 940 Ohm
Lead Channel Pacing Threshold Amplitude: 0.625 V
Lead Channel Pacing Threshold Amplitude: 1 V
Lead Channel Sensing Intrinsic Amplitude: 12 mV
Lead Channel Sensing Intrinsic Amplitude: 2.9 mV
Lead Channel Setting Pacing Amplitude: 2 V
Lead Channel Setting Pacing Amplitude: 2 V
Lead Channel Setting Pacing Amplitude: 2 V
Lead Channel Setting Pacing Pulse Width: 0.5 ms
Lead Channel Setting Sensing Sensitivity: 0.5 mV
MDC IDC MSMT LEADCHNL LV PACING THRESHOLD PULSEWIDTH: 0.5 ms
MDC IDC MSMT LEADCHNL RA PACING THRESHOLD AMPLITUDE: 1 V
MDC IDC MSMT LEADCHNL RA PACING THRESHOLD PULSEWIDTH: 0.5 ms
MDC IDC MSMT LEADCHNL RV PACING THRESHOLD PULSEWIDTH: 0.5 ms
MDC IDC SET LEADCHNL RV PACING PULSEWIDTH: 0.5 ms
MDC IDC SET ZONE DETECTION INTERVAL: 320 ms
MDC IDC STAT BRADY AP VS PERCENT: 1 %
Pulse Gen Serial Number: 752248

## 2014-11-03 ENCOUNTER — Encounter: Payer: Self-pay | Admitting: Cardiology

## 2014-11-10 ENCOUNTER — Encounter: Payer: Self-pay | Admitting: Internal Medicine

## 2014-11-10 ENCOUNTER — Ambulatory Visit (INDEPENDENT_AMBULATORY_CARE_PROVIDER_SITE_OTHER): Payer: Medicare Other | Admitting: Internal Medicine

## 2014-11-10 VITALS — BP 142/86 | HR 67 | Ht 66.5 in | Wt 214.0 lb

## 2014-11-10 DIAGNOSIS — R0600 Dyspnea, unspecified: Secondary | ICD-10-CM | POA: Diagnosis not present

## 2014-11-10 DIAGNOSIS — R490 Dysphonia: Secondary | ICD-10-CM | POA: Diagnosis not present

## 2014-11-10 DIAGNOSIS — Z86711 Personal history of pulmonary embolism: Secondary | ICD-10-CM | POA: Diagnosis not present

## 2014-11-10 DIAGNOSIS — R0689 Other abnormalities of breathing: Secondary | ICD-10-CM | POA: Insufficient documentation

## 2014-11-10 DIAGNOSIS — Z720 Tobacco use: Secondary | ICD-10-CM | POA: Diagnosis not present

## 2014-11-10 DIAGNOSIS — Z87891 Personal history of nicotine dependence: Secondary | ICD-10-CM | POA: Insufficient documentation

## 2014-11-10 NOTE — Patient Instructions (Addendum)
ICD-9-CM ICD-10-CM   1. Dyspnea and respiratory abnormality 786.09 R06.00     R06.89   2. Hoarseness of voice 784.42 R49.0   3. Smoking history V15.82 Z72.0   4. History of pulmonary embolism V12.55 Z86.711    PLAN - do ONO test on Room Air  - Do full PFT - Do HRCT chest - prone and supine - do CT neck wo contrast - do 2D Echo - assess pulmonary hypertension and left ventricular function  Followup  - regroup after finishing all of the above < 2-3 weeks - to see me

## 2014-11-10 NOTE — Progress Notes (Signed)
Subjective:    Patient ID: Emily Abbott, female    DOB: Jan 03, 1941, 74 y.o.   MRN: [id number]  HPI PCP Vidal Schwalbe, MD   OV 11/10/2014  Chief Complaint  Patient presents with  . Pulmonary Consult    Self referral for DOE. Pt c/o hoarseness x 1 year. Pt c/o non prod cough with congestion in throat - pt states she cannot bring mucus up. Pt denies CP/tightness.     74 year old female followed by Dr. Harlan Stains primary care physician. She presents to the pulmonary clinic at the insistence of her neice and her daughter were very concerned about her shortness of breath and associated fatigue. Patient reports that she has a history of COPD not otherwise specified and is on Advair inhaler for the last 1 year. At the time she was started on Advair she started noticing a voice change in terms of hoarseness. This is progressively deteriorated since then. This is continued to get worse even after they reduce the dose of Advair discus to the lower dose. The hoarseness present all the time. Apparently she's had 2 evaluations by ENT physician whose name she does not recall and this has been normal laryngoscopy exam according to her history. In addition she is also having exertional shortness of breath associated with fatigue. Fatigue component seems to be higher. 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 The knees believes that she needs to be on oxygen therapy. They're also concerned about pedal edema.  Review of the chart shows she had a normal cardiac stress test in 2007 but in 0350 she had systolic heart failure with ejection fraction 35%. Sometime after this while in [place] she underwent emergency cardiac bypass surgery. Around the time of the bypass surgery possibly before the surgery she did suffer her first and only episode of pulmonary embolism for which she was started on Coumadin therapy. She is now being maintained on Coumadin therapy since then presumably due to  atrial fibrillation which she says is still present on and off. Echocardiogram most recently June 2015 shows improvement of ejection fraction to 55% which she does have significant grade 3 diastolic dysfunction associated with elevated pulmonary artery systolic pressure on echo to 55 mmHg.  Other lab tests - Pulmonary function test: She did have spirometry in her primary care physician office this year and apparently "passed it" - Chest x-ray of 03/24/2013: Personally visualized image her lung fields are clear - Echocardiogram: Last June 2015. Shows diastolic dysfunction and elevated pulmonary artery systolic pressure with left ventricle ejection fraction of 55% - Walking desaturation test 185 feet 3 laps on room air: Did not desaturate below 93%.    has a past medical history of Paroxysmal atrial fibrillation; CAD (coronary artery disease); HTN (hypertension); Hyperlipidemia, mixed; Diabetes mellitus; Mitral valve disorder; GERD (gastroesophageal reflux disease); Pulmonary embolism; Anemia; COPD (chronic obstructive pulmonary disease); Osteoarthritis; Anxiety; and Panic attack.   reports that she quit smoking about 13 years ago. Her smoking use included Cigarettes. She has a 50 pack-year smoking history. She has never used smokeless tobacco.  Past Surgical History  Procedure Laterality Date  . Btl    . Biopsy breast    . Carpal tennel  2003  . Cardiac valve surgery      mitral    Allergies  Allergen Reactions  . Sulfonamide Derivatives      There is no immunization history on file for this patient.  Family History  Problem Relation Age of  Onset  . Stroke Mother   . Anuerysm Father   . Diabetes Sister   . Cancer Brother   . Ulcers Sister      Current outpatient prescriptions:  .  albuterol (PROAIR HFA) 108 (90 BASE) MCG/ACT inhaler, Use as directed as needed, Disp: , Rfl:  .  ALPRAZolam (XANAX) 0.5 MG tablet, Take 0.5 mg by mouth 2 (two) times daily as needed. , Disp: ,  Rfl:  .  aspirin 325 MG tablet, Take 325 mg by mouth daily.  , Disp: , Rfl:  .  carvedilol (COREG) 6.25 MG tablet, Take 1.5 tablets (9.375 mg total) by mouth 2 (two) times daily with a meal. (Patient taking differently: Take 6.25 mg by mouth 2 (two) times daily with a meal. ), Disp: 270 tablet, Rfl: 3 .  diphenhydrAMINE (BENADRYL) 25 mg capsule, Take 25 mg by mouth at bedtime.  , Disp: , Rfl:  .  Fluticasone-Salmeterol (ADVAIR DISKUS) 100-50 MCG/DOSE AEPB, Inhale 1 puff into the lungs 2 (two) times daily.  , Disp: , Rfl:  .  furosemide (LASIX) 20 MG tablet, Take 2 tablets (40 mg total) by mouth daily., Disp: 180 tablet, Rfl: 3 .  HYDROcodone-acetaminophen (VICODIN) 5-500 MG per tablet, Take 1 tablet by mouth as needed.  , Disp: , Rfl:  .  metFORMIN (GLUCOPHAGE) 850 MG tablet, Take 850 mg by mouth 2 (two) times daily with a meal.  , Disp: , Rfl:  .  omeprazole (PRILOSEC) 40 MG capsule, Take 40 mg by mouth daily.  , Disp: , Rfl:  .  PARoxetine (PAXIL) 30 MG tablet, Take 30 mg by mouth daily.  , Disp: , Rfl:  .  simvastatin (ZOCOR) 40 MG tablet, Take 20 mg by mouth at bedtime. , Disp: , Rfl:  .  TIKOSYN 250 MCG capsule, TAKE 1 CAPSULE BY MOUTH TWICE DAILY, Disp: 60 capsule, Rfl: 10 .  warfarin (COUMADIN) 5 MG tablet, Use as directed by Alta Corning, MD , Disp: , Rfl:  .  zolpidem (AMBIEN) 10 MG tablet, Take 5 mg by mouth at bedtime as needed. , Disp: , Rfl:      Review of Systems  Constitutional: Negative for fever and unexpected weight change.  HENT: Negative for congestion, dental problem, ear pain, nosebleeds, postnasal drip, rhinorrhea, sinus pressure, sneezing, sore throat and trouble swallowing.   Eyes: Negative for redness and itching.  Respiratory: Positive for cough and shortness of breath. Negative for chest tightness and wheezing.   Cardiovascular: Negative for palpitations and leg swelling.  Gastrointestinal: Negative for nausea and vomiting.  Genitourinary: Negative for dysuria.    Musculoskeletal: Negative for joint swelling.  Skin: Negative for rash.  Neurological: Negative for headaches.  Hematological: Does not bruise/bleed easily.  Psychiatric/Behavioral: Negative for dysphoric mood. The patient is not nervous/anxious.        Objective:   Physical Exam  Constitutional: She is oriented to person, place, and time. She appears well-developed and well-nourished. No distress.  Body mass index is 34.03 kg/(m^2).   HENT:  Head: Normocephalic and atraumatic.  Right Ear: External ear normal.  Left Ear: External ear normal.  Mouth/Throat: Oropharynx is clear and moist. No oropharyngeal exudate.  Significant hoarse voice while talking  Eyes: Conjunctivae and EOM are normal. Pupils are equal, round, and reactive to light. Right eye exhibits no discharge. Left eye exhibits no discharge. No scleral icterus.  Neck: Normal range of motion. Neck supple. No JVD present. No tracheal deviation present. No thyromegaly present.  Cardiovascular:  Normal rate, regular rhythm, normal heart sounds and intact distal pulses.  Exam reveals no gallop and no friction rub.   No murmur heard. Pulmonary/Chest: Effort normal and breath sounds normal. No respiratory distress. She has no wheezes. She has no rales. She exhibits no tenderness.  Abdominal: Soft. Bowel sounds are normal. She exhibits no distension and no mass. There is no tenderness. There is no rebound and no guarding.  Musculoskeletal: Normal range of motion. She exhibits edema. She exhibits no tenderness.  Mild chronic venous stasis edema present  Lymphadenopathy:    She has no cervical adenopathy.  Neurological: She is alert and oriented to person, place, and time. She has normal reflexes. No cranial nerve deficit. She exhibits normal muscle tone. Coordination normal.  Skin: Skin is warm and dry. No rash noted. She is not diaphoretic. No erythema. No pallor.  Psychiatric: She has a normal mood and affect. Her behavior is  normal. Judgment and thought content normal.  Vitals reviewed.   Filed Vitals:   11/10/14 1556  BP: 142/86  Pulse: 67  Height: 5' 6.5" (1.689 m)  Weight: 214 lb (97.07 kg)  SpO2: 97%          Assessment & Plan:     ICD-9-CM ICD-10-CM   1. Dyspnea and respiratory abnormality 786.09 R06.00     R06.89   2. Hoarseness of voice 784.42 R49.0   3. Smoking history V15.82 Z72.0     #dyspnea  - Data points of obesity, diastolic dysfunction on echo ponit to  obesity, deconditioning and diastolic dysfunction as the primary cause of dyspnea particularly with the history that her spirometry was normal. She might have COPD but we will need to prove this. Other differential diagnoses includes interstitial lung disease given her smoking history and CTEPH given hx of PE; she did have elevated PASP on echo last year. She has never had CT scan of the chest. She is on anticoagulation with Coumadin  #Hoarseness of voice - Given normal ENT evaluation presumably hoarseness is due to Advair but she's never had a CT scan of the neck  PLAN - do ONO test on Room Air  - Do full PFT - Do HRCT chest - prone and supine - do CT neck wo contrast - do 2D Echo - assess pulmonary hypertension and left ventricular function  Followup  - regroup after finishing all of the above < 2-3 weeks - to see me  Dr. Brand Males, M.D., [facility].C.P Pulmonary and Critical Care Medicine Staff Physician [place] [facility] Pager: [phone], If no answer or between  15:00h - 7:00h: call [phone]  11/10/2014 4:39 PM

## 2014-11-11 ENCOUNTER — Ambulatory Visit (INDEPENDENT_AMBULATORY_CARE_PROVIDER_SITE_OTHER): Payer: Medicare Other | Admitting: Internal Medicine

## 2014-11-11 DIAGNOSIS — R0600 Dyspnea, unspecified: Secondary | ICD-10-CM | POA: Diagnosis not present

## 2014-11-11 DIAGNOSIS — R0689 Other abnormalities of breathing: Secondary | ICD-10-CM | POA: Diagnosis not present

## 2014-11-11 LAB — PULMONARY FUNCTION TEST
DL/VA % PRED: 47 %
DL/VA: 2.4 ml/min/mmHg/L
DLCO unc % pred: 48 %
DLCO unc: 12.98 ml/min/mmHg
FEF 25-75 POST: 1.35 L/s
FEF 25-75 PRE: 1.22 L/s
FEF2575-%Change-Post: 10 %
FEF2575-%PRED-PRE: 70 %
FEF2575-%Pred-Post: 78 %
FEV1-%Change-Post: 1 %
FEV1-%PRED-PRE: 90 %
FEV1-%Pred-Post: 91 %
FEV1-PRE: 2.05 L
FEV1-Post: 2.08 L
FEV1FVC-%CHANGE-POST: 5 %
FEV1FVC-%PRED-PRE: 93 %
FEV6-%CHANGE-POST: -3 %
FEV6-%PRED-PRE: 101 %
FEV6-%Pred-Post: 97 %
FEV6-POST: 2.81 L
FEV6-Pre: 2.92 L
FEV6FVC-%Change-Post: 0 %
FEV6FVC-%PRED-POST: 105 %
FEV6FVC-%Pred-Pre: 104 %
FVC-%CHANGE-POST: -3 %
FVC-%PRED-PRE: 97 %
FVC-%Pred-Post: 93 %
FVC-POST: 2.83 L
FVC-PRE: 2.94 L
POST FEV6/FVC RATIO: 100 %
PRE FEV1/FVC RATIO: 70 %
Post FEV1/FVC ratio: 73 %
Pre FEV6/FVC Ratio: 99 %

## 2014-11-11 NOTE — Progress Notes (Signed)
PFT done today. 

## 2014-11-15 ENCOUNTER — Ambulatory Visit (HOSPITAL_COMMUNITY): Payer: Medicare Other | Attending: Internal Medicine

## 2014-11-15 ENCOUNTER — Ambulatory Visit (INDEPENDENT_AMBULATORY_CARE_PROVIDER_SITE_OTHER)
Admission: RE | Admit: 2014-11-15 | Discharge: 2014-11-15 | Disposition: A | Discharge status: Home / Self Care | Payer: Medicare Other | Source: Ambulatory Visit | Attending: Internal Medicine | Admitting: Internal Medicine

## 2014-11-15 ENCOUNTER — Other Ambulatory Visit: Payer: Self-pay

## 2014-11-15 DIAGNOSIS — I517 Cardiomegaly: Secondary | ICD-10-CM | POA: Diagnosis not present

## 2014-11-15 DIAGNOSIS — E119 Type 2 diabetes mellitus without complications: Secondary | ICD-10-CM | POA: Diagnosis not present

## 2014-11-15 DIAGNOSIS — E785 Hyperlipidemia, unspecified: Secondary | ICD-10-CM | POA: Diagnosis not present

## 2014-11-15 DIAGNOSIS — I4891 Unspecified atrial fibrillation: Secondary | ICD-10-CM | POA: Insufficient documentation

## 2014-11-15 DIAGNOSIS — F17200 Nicotine dependence, unspecified, uncomplicated: Secondary | ICD-10-CM | POA: Diagnosis not present

## 2014-11-15 DIAGNOSIS — Z86711 Personal history of pulmonary embolism: Secondary | ICD-10-CM | POA: Diagnosis not present

## 2014-11-15 DIAGNOSIS — R490 Dysphonia: Secondary | ICD-10-CM

## 2014-11-15 DIAGNOSIS — I10 Essential (primary) hypertension: Secondary | ICD-10-CM | POA: Diagnosis not present

## 2014-11-15 DIAGNOSIS — R0689 Other abnormalities of breathing: Secondary | ICD-10-CM

## 2014-11-15 DIAGNOSIS — R0600 Dyspnea, unspecified: Secondary | ICD-10-CM | POA: Diagnosis not present

## 2014-11-24 ENCOUNTER — Ambulatory Visit (INDEPENDENT_AMBULATORY_CARE_PROVIDER_SITE_OTHER): Payer: Medicare Other | Admitting: Internal Medicine

## 2014-11-24 ENCOUNTER — Encounter: Payer: Self-pay | Admitting: Internal Medicine

## 2014-11-24 ENCOUNTER — Telehealth: Payer: Self-pay | Admitting: Internal Medicine

## 2014-11-24 VITALS — BP 128/78 | HR 72 | Ht 65.5 in | Wt 213.4 lb

## 2014-11-24 DIAGNOSIS — R0600 Dyspnea, unspecified: Secondary | ICD-10-CM | POA: Diagnosis not present

## 2014-11-24 DIAGNOSIS — R911 Solitary pulmonary nodule: Secondary | ICD-10-CM | POA: Diagnosis not present

## 2014-11-24 DIAGNOSIS — J438 Other emphysema: Secondary | ICD-10-CM

## 2014-11-24 DIAGNOSIS — R0689 Other abnormalities of breathing: Secondary | ICD-10-CM

## 2014-11-24 DIAGNOSIS — R490 Dysphonia: Secondary | ICD-10-CM | POA: Diagnosis not present

## 2014-11-24 MED ORDER — TIOTROPIUM BROMIDE MONOHYDRATE 18 MCG IN CAPS: 18.0000 ug | ORAL_CAPSULE | Freq: Every day | RESPIRATORY_TRACT | Status: DC

## 2014-11-24 NOTE — Telephone Encounter (Signed)
[name]  Please make sure therey are not ordereing a new ct chest . nly needs new pet scan. Needs super D made of old CT and given to Byrum and OV with byrum

## 2014-11-24 NOTE — Patient Instructions (Addendum)
ICD-9-CM ICD-10-CM   1. Dyspnea and respiratory abnormality 786.09 R06.00     R06.89   2. Hoarseness of voice 784.42 R49.0   3. Lung nodule, solitary 793.11 R91.1   4. Other emphysema (HCC) 492.8 J43.8     #Shortness of breath  - follow emhysema directions below and then assess for CPST  #Emphysema lung with nocturnal desaturation  - continue advair  - will try to remove it later  - add spiriva 1 puff daily  - refer pulmonary rehab - start 1L O2 at night  #Lung nodule right - SUper D CT disc to Dr Lamonte Sakai  -=PET scan  - follow after above with Dr Lamonte Sakai  # Hoarseness of voice  - refer Dr Erik Obey ENT  #followup  = Dr Lamonte Sakai after PET scan with Super D Disc - next few weeks - me in 2 months

## 2014-11-24 NOTE — Progress Notes (Signed)
Subjective:    Patient ID: Emily Abbott, female    DOB: [birth year], 74 y.o.   MRN: [id number]  HPI    OV 11/10/2014  Chief Complaint  Patient presents with  . Pulmonary Consult    Self referral for DOE. Pt c/o hoarseness x 1 year. Pt c/o non prod cough with congestion in throat - pt states she cannot bring mucus up. Pt denies CP/tightness.     74 year old female followed by Dr. Harlan Stains primary care physician. She presents to the pulmonary clinic at the insistence of her neice and her daughter were very concerned about her shortness of breath and associated fatigue. Patient reports that she has a history of COPD not otherwise specified and is on Advair inhaler for the last 1 year. At the time she was started on Advair she started noticing a voice change in terms of hoarseness. This is progressively deteriorated since then. This is continued to get worse even after they reduce the dose of Advair discus to the lower dose. The hoarseness present all the time. Apparently she's had 2 evaluations by ENT physician whose name she does not recall and this has been normal laryngoscopy exam according to her history. In addition she is also having exertional shortness of breath associated with fatigue. Fatigue component seems to be higher. 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 The knees believes that she needs to be on oxygen therapy. They're also concerned about pedal edema.  Review of the chart shows she had a normal cardiac stress test in 2007 but in 2683 she had systolic heart failure with ejection fraction 35%. Sometime after this while in [place] she underwent emergency cardiac bypass surgery. Around the time of the bypass surgery possibly before the surgery she did suffer her first and only episode of pulmonary embolism for which she was started on Coumadin therapy. She is now being maintained on Coumadin therapy since then presumably due to atrial fibrillation  which she says is still present on and off. Echocardiogram most recently June 2015 shows improvement of ejection fraction to 55% which she does have significant grade 3 diastolic dysfunction associated with elevated pulmonary artery systolic pressure on echo to 55 mmHg.  Other lab tests - Pulmonary function test: She did have spirometry in her primary care physician office this year and apparently "passed it" - Chest x-ray of 03/24/2013: Personally visualized image her lung fields are clear - Echocardiogram: Last June 2015. Shows diastolic dysfunction and elevated pulmonary artery systolic pressure with left ventricle ejection fraction of 55% - Walking desaturation test 185 feet 3 laps on room air: Did not desaturate below 93%.     OV 11/24/2014  Chief Complaint  Patient presents with  . Follow-up    3 week follow up. Pt states that her breathing is the same. Pt states that she has cough/wheeze. Pt states that she does have mucus but cannot get it up enough to spit it out, she usually ends up swallowing it. Pt denies any CP/tightness. Pt denies any f/n/v/d.    Presents with her daughter [name] - for evaluation of dyspnea/fatigue and hoarseness of voice. She's had several tests in the interim  Overnight desaturation test shows pulse ox dipped less than 88% for several minutes at night  Pulmonary function test 11/11/2014 shows isolated reduction In diffusion capacity to 12.98/40%. Otherwise lung volumes and forced vital capacity and FEV1 are normal.  CT scan chest high resolution 11/15/2014 shows no evidence of  interstitial lung disease. There is paraseptal emphysema and coronary artery calcification.. There is incidental finding of 1 cm right upper lobe nodule  CT scan of the neck 11/15/2014: Does not reveal any specific etiology for chronic hoarseness of voice  Echocardiogram 11/15/2014: Similar to the old one. She has atrial fibrillation. She has elevated pulmonary artery  systolic pressure mid 67T. Left frontal ejection fraction is 65%. Unclear if there is diastolic dysfunction.    There is no immunization history on file for this patient.   Current outpatient prescriptions:  .  albuterol (PROAIR HFA) 108 (90 BASE) MCG/ACT inhaler, Use as directed as needed, Disp: , Rfl:  .  ALPRAZolam (XANAX) 0.5 MG tablet, Take 0.5 mg by mouth 2 (two) times daily as needed. , Disp: , Rfl:  .  aspirin 325 MG tablet, Take 325 mg by mouth daily.  , Disp: , Rfl:  .  carvedilol (COREG) 6.25 MG tablet, Take 1.5 tablets (9.375 mg total) by mouth 2 (two) times daily with a meal. (Patient taking differently: Take 6.25 mg by mouth 2 (two) times daily with a meal. ), Disp: 270 tablet, Rfl: 3 .  diphenhydrAMINE (BENADRYL) 25 mg capsule, Take 25 mg by mouth at bedtime.  , Disp: , Rfl:  .  dofetilide (TIKOSYN) 250 MCG capsule, Take 250 mcg by mouth 2 (two) times daily., Disp: , Rfl:  .  Fluticasone-Salmeterol (ADVAIR DISKUS) 100-50 MCG/DOSE AEPB, Inhale 1 puff into the lungs 2 (two) times daily.  , Disp: , Rfl:  .  furosemide (LASIX) 20 MG tablet, Take 2 tablets (40 mg total) by mouth daily., Disp: 180 tablet, Rfl: 3 .  HYDROcodone-acetaminophen (VICODIN) 5-500 MG per tablet, Take 1 tablet by mouth as needed.  , Disp: , Rfl:  .  metFORMIN (GLUCOPHAGE) 850 MG tablet, Take 850 mg by mouth 2 (two) times daily with a meal.  , Disp: , Rfl:  .  omeprazole (PRILOSEC) 40 MG capsule, Take 40 mg by mouth daily.  , Disp: , Rfl:  .  PARoxetine (PAXIL) 30 MG tablet, Take 30 mg by mouth daily.  , Disp: , Rfl:  .  VYTORIN 10-40 MG tablet, Take 1 tablet by mouth daily., Disp: , Rfl: 3 .  warfarin (COUMADIN) 5 MG tablet, Use as directed by Alta Corning, MD , Disp: , Rfl:  .  zolpidem (AMBIEN) 10 MG tablet, Take 5 mg by mouth at bedtime as needed. , Disp: , Rfl:    Allergies  Allergen Reactions  . Sulfonamide Derivatives       Review of Systems     Objective:   Physical Exam  Filed Vitals:     11/24/14 1631  BP: 128/78  Pulse: 72  Height: 5' 5.5" (1.664 m)  Weight: 213 lb 6.4 oz (96.798 kg)  SpO2: 98%   discussion only visit    Assessment & Plan:     ICD-9-CM ICD-10-CM   1. Dyspnea and respiratory abnormality 786.09 R06.00     R06.89   2. Hoarseness of voice 784.42 R49.0 Ambulatory referral to ENT  3. Lung nodule, solitary 793.11 R91.1 NM PET Image Initial (PI) Skull Base To Thigh  4. Other emphysema (HCC) 492.8 J43.8 AMB referral to Rehabilitation     Ambulatory Referral for DME   #Dyspnea - Clearly out of proportion to the level of pulmonary abnormalities which is only isolated reduction in diffusion capacity to 48% probably related to emphysema and some amount due to secondary pulmonary hypertension. She does not have ambulatory  desaturations but does have overnight desaturations. At this point we will try to optimize her emphysema by adding Spiriva to her Advair. [At some point we can stop the Advair]. In addition we'll add nocturnal oxygen use and referred to pulmonary rehabilitation. If these do not help then we will refer her to pulmonary stress test whereby the role of atrial fibrillation can be delineated in her dyspnea  #Hoarseness of voice - Unclear cause. Given prior smoking history she is at risk for vocal cord cancer. Last ENT evaluation was with an unknown ENT physician over one year ago. Her daughter is familiar with Dr Erik Obey. So refer to him because head and neck CT is not revealing any cause00  #New issue of right upper lobe lung nodule - Explain that this is of intermediate probably of lung cancer. If so early stage lung cancer stage I. Proceed with PET scan and converting her recent CT chest tino super D CT disc and seeing Dr. Baltazar Apo for evaluation for bronchoscopy   > 50% of this > 25 min visit spent in face to face counseling or coordination of care    Dr. Brand Males, M.D., [facility].C.P Pulmonary and Critical Care Medicine Staff  Physician [place] [facility] Pager: [phone], If no answer or between  15:00h - 7:00h: call [phone]  11/24/2014 5:32 PM

## 2014-11-25 NOTE — Addendum Note (Signed)
Addended by: Maurice March on: 11/25/2014 09:35 AM   Modules accepted: Orders

## 2014-11-25 NOTE — Telephone Encounter (Signed)
There is not a new CT order in the system. I called CT and requested the CT from 9.27.16 have super D cuts. Once received, will give to [place]. Will keep message open to follow.

## 2014-12-03 NOTE — Telephone Encounter (Signed)
Received super D disc on pt and placed in RB's purple look at folder. Pt has an upcoming appt with RB on 10.28.2016. Pt verbalized understanding and denied any further questions or concerns at this time.    Will forward to RB and MR as FYI.

## 2014-12-03 NOTE — Telephone Encounter (Signed)
[name], did you receive this disk yet?

## 2014-12-08 ENCOUNTER — Ambulatory Visit (HOSPITAL_COMMUNITY)
Admission: RE | Admit: 2014-12-08 | Discharge: 2014-12-08 | Disposition: A | Discharge status: Home / Self Care | Payer: Medicare Other | Source: Ambulatory Visit | Attending: Internal Medicine | Admitting: Internal Medicine

## 2014-12-08 DIAGNOSIS — K760 Fatty (change of) liver, not elsewhere classified: Secondary | ICD-10-CM | POA: Diagnosis not present

## 2014-12-08 DIAGNOSIS — I700 Atherosclerosis of aorta: Secondary | ICD-10-CM | POA: Insufficient documentation

## 2014-12-08 DIAGNOSIS — Z950 Presence of cardiac pacemaker: Secondary | ICD-10-CM | POA: Diagnosis not present

## 2014-12-08 DIAGNOSIS — R911 Solitary pulmonary nodule: Secondary | ICD-10-CM

## 2014-12-08 DIAGNOSIS — J439 Emphysema, unspecified: Secondary | ICD-10-CM | POA: Insufficient documentation

## 2014-12-08 DIAGNOSIS — Z951 Presence of aortocoronary bypass graft: Secondary | ICD-10-CM | POA: Insufficient documentation

## 2014-12-08 DIAGNOSIS — D3501 Benign neoplasm of right adrenal gland: Secondary | ICD-10-CM | POA: Insufficient documentation

## 2014-12-08 DIAGNOSIS — R918 Other nonspecific abnormal finding of lung field: Secondary | ICD-10-CM | POA: Diagnosis not present

## 2014-12-08 LAB — GLUCOSE, CAPILLARY: Glucose-Capillary: 158 mg/dL — ABNORMAL HIGH (ref 65–99)

## 2014-12-08 MED ORDER — FLUDEOXYGLUCOSE F - 18 (FDG) INJECTION
10.3000 | Freq: Once | INTRAVENOUS | Status: DC | PRN
Start: 2014-12-08 — End: 2014-12-14
  Administered 2014-12-08: 10.3 via INTRAVENOUS
  Filled 2014-12-08: qty 10.3

## 2014-12-13 ENCOUNTER — Telehealth (HOSPITAL_COMMUNITY): Payer: Self-pay

## 2014-12-13 NOTE — Telephone Encounter (Signed)
Called patient to discuss Pulmonary Rehab. Patient stated that it wasn't a good time to talk because she had just sat down to eat. Patient would like for me to follow up at a later time.

## 2014-12-17 ENCOUNTER — Encounter: Payer: Self-pay | Admitting: Emergency Medicine

## 2014-12-17 ENCOUNTER — Ambulatory Visit (INDEPENDENT_AMBULATORY_CARE_PROVIDER_SITE_OTHER): Payer: Medicare Other | Admitting: Emergency Medicine

## 2014-12-17 VITALS — BP 128/76 | HR 78 | Ht 66.5 in | Wt 216.0 lb

## 2014-12-17 DIAGNOSIS — R911 Solitary pulmonary nodule: Secondary | ICD-10-CM

## 2014-12-17 NOTE — Assessment & Plan Note (Signed)
1 cm right upper lobe nodule that is not hypermetabolic on PET scan. The PET scan does not show any lymphadenopathy or distant disease. I explained the scans to her and we discussed the possible strategies including biopsy now versus repeat CT scan to look for interval change. I have recommended that we follow the nodule on serial scans and she agrees with this plan. The next scan will be a super D CT scan at the end of December 2016. If there is interval change and I believe she either needs a navigational bronchoscopy or a referral to thoracic surgery for removal. If there is no interval change then we will plan to follow this with serial films. I will order the scan today and she will follow-up with Dr. Chase Caller to review the results.

## 2014-12-17 NOTE — Patient Instructions (Signed)
We will repeat your CT scan of the chest in 3 months.  Depending on the results of your CT we will decide whether to perform bronchoscopy or to consider surgical evaluation to have the nodule removed. If the nodule is unchanged then we will plan to follow it over time.  Please speak with Dr Chase Caller about a possible sleep study.  Follow with Dr Chase Caller as planned.

## 2014-12-17 NOTE — Progress Notes (Signed)
Subjective:    Patient ID: Emily Abbott, female    DOB: [birth year], 74 y.o.   MRN: [id number]  HPI 74 year old former smoker with history of coronary disease, diabetes, atrophic fibrillation, COPD. She has been evaluated by Dr. Chase Caller for her dyspnea and a high-resolution CT scan of the chest performed on 11/15/14 showed a 1 severe right upper lobe nodule. A subsequent PET scan was performed on 10/19 that I personally reviewed. This showed no evidence for hypermetabolism in the 1 cm nodule. Other scattered smaller nodules were seen.   I have personally reviewed Pulmonary function testing performed on 11/11/14 showed evidence for mild obstruction based on the curvature of the flow-volume loop with an FEV1 of 90% predicted.    Review of Systems As per HPI  Past Medical History  Diagnosis Date  . Paroxysmal atrial fibrillation (HCC)   . CAD (coronary artery disease)   . HTN (hypertension)     essential  . Hyperlipidemia, mixed   . Diabetes mellitus     type II  . Mitral valve disorder   . GERD (gastroesophageal reflux disease)   . Pulmonary embolism ([place])   . Anemia   . COPD (chronic obstructive pulmonary disease) ([place])   . Osteoarthritis   . Anxiety   . Panic attack      Family History  Problem Relation Age of Onset  . Stroke Mother   . Anuerysm Father   . Diabetes Sister   . Cancer Brother   . Ulcers Sister      Social History   Social History  . Marital Status: Widowed    Spouse Name: N/A  . Number of Children: N/A  . Years of Education: N/A   Occupational History  . reitred    Social History Main Topics  . Smoking status: Former Smoker -- 1.00 packs/day for 50 years    Types: Cigarettes    Quit date: 02/19/2001  . Smokeless tobacco: Never Used  . Alcohol Use: No  . Drug Use: Not on file  . Sexual Activity: Not on file   Other Topics Concern  . Not on file   Social History Narrative     Allergies  Allergen Reactions  . Sulfonamide Derivatives       Outpatient Prescriptions Prior to Visit  Medication Sig Dispense Refill  . albuterol (PROAIR HFA) 108 (90 BASE) MCG/ACT inhaler Use as directed as needed    . ALPRAZolam (XANAX) 0.5 MG tablet Take 0.5 mg by mouth 2 (two) times daily as needed.     Marland Kitchen aspirin 325 MG tablet Take 325 mg by mouth daily.      . carvedilol (COREG) 6.25 MG tablet Take 1.5 tablets (9.375 mg total) by mouth 2 (two) times daily with a meal. (Patient taking differently: Take 6.25 mg by mouth 2 (two) times daily with a meal. ) 270 tablet 3  . diphenhydrAMINE (BENADRYL) 25 mg capsule Take 25 mg by mouth at bedtime.      . dofetilide (TIKOSYN) 250 MCG capsule Take 250 mcg by mouth 2 (two) times daily.    . Fluticasone-Salmeterol (ADVAIR DISKUS) 100-50 MCG/DOSE AEPB Inhale 1 puff into the lungs 2 (two) times daily.      . furosemide (LASIX) 20 MG tablet Take 2 tablets (40 mg total) by mouth daily. 180 tablet 3  . HYDROcodone-acetaminophen (VICODIN) 5-500 MG per tablet Take 1 tablet by mouth as needed.      . metFORMIN (GLUCOPHAGE) 850 MG tablet Take 850 mg  by mouth 2 (two) times daily with a meal.      . omeprazole (PRILOSEC) 40 MG capsule Take 40 mg by mouth daily.      Marland Kitchen PARoxetine (PAXIL) 30 MG tablet Take 30 mg by mouth daily.      Marland Kitchen tiotropium (SPIRIVA) 18 MCG inhalation capsule Place 1 capsule (18 mcg total) into inhaler and inhale daily. 30 capsule 6  . VYTORIN 10-40 MG tablet Take 1 tablet by mouth daily.  3  . warfarin (COUMADIN) 5 MG tablet Use as directed by Alta Corning, MD     . zolpidem (AMBIEN) 10 MG tablet Take 5 mg by mouth at bedtime as needed.      No facility-administered medications prior to visit.         Objective:   Physical Exam  Filed Vitals:   12/17/14 1540  BP: 128/76  Pulse: 78  Height: 5' 6.5" (1.689 m)  Weight: 216 lb (97.977 kg)  SpO2: 95%    Gen: Pleasant, well-nourished, in no distress,  normal affect  ENT: No lesions,  mouth clear,  oropharynx clear, no postnasal  drip  Neck: No JVD, no TMG, no carotid bruits  Lungs: No use of accessory muscles, clear without rales or rhonchi  Cardiovascular: RRR, heart sounds normal, no murmur or gallops, no peripheral edema  Musculoskeletal: No deformities, no cyanosis or clubbing  Neuro: alert, non focal  Skin: Warm, no lesions or rash     Assessment & Plan:  Lung nodule, solitary 1 cm right upper lobe nodule that is not hypermetabolic on PET scan. The PET scan does not show any lymphadenopathy or distant disease. I explained the scans to her and we discussed the possible strategies including biopsy now versus repeat CT scan to look for interval change. I have recommended that we follow the nodule on serial scans and she agrees with this plan. The next scan will be a super D CT scan at the end of December 2016. If there is interval change and I believe she either needs a navigational bronchoscopy or a referral to thoracic surgery for removal. If there is no interval change then we will plan to follow this with serial films. I will order the scan today and she will follow-up with Dr. Chase Caller to review the results.

## 2014-12-20 ENCOUNTER — Telehealth (HOSPITAL_COMMUNITY): Payer: Self-pay

## 2014-12-20 NOTE — Telephone Encounter (Signed)
I have called and left a message with Emily Abbott to inquire about participation in Pulmonary Rehab per Dr. Golden Pop referral. Will send letter in mail and follow up.

## 2014-12-20 NOTE — Progress Notes (Signed)
Quick Note:  Results discussed at 10.28.16 OV with RB. Nothing further needed at this time. ______

## 2014-12-30 ENCOUNTER — Encounter: Payer: Self-pay | Admitting: Podiatry

## 2014-12-30 ENCOUNTER — Ambulatory Visit (INDEPENDENT_AMBULATORY_CARE_PROVIDER_SITE_OTHER): Payer: Medicare Other | Admitting: Podiatry

## 2014-12-30 ENCOUNTER — Ambulatory Visit: Payer: Medicare Other

## 2014-12-30 ENCOUNTER — Ambulatory Visit (INDEPENDENT_AMBULATORY_CARE_PROVIDER_SITE_OTHER): Payer: Medicare Other

## 2014-12-30 VITALS — BP 143/77 | HR 70 | Resp 16 | Ht 66.5 in | Wt 216.0 lb

## 2014-12-30 DIAGNOSIS — M79606 Pain in leg, unspecified: Secondary | ICD-10-CM | POA: Diagnosis not present

## 2014-12-30 DIAGNOSIS — E1151 Type 2 diabetes mellitus with diabetic peripheral angiopathy without gangrene: Secondary | ICD-10-CM | POA: Diagnosis not present

## 2014-12-30 DIAGNOSIS — B351 Tinea unguium: Secondary | ICD-10-CM | POA: Diagnosis not present

## 2014-12-30 DIAGNOSIS — L84 Corns and callosities: Secondary | ICD-10-CM

## 2014-12-30 DIAGNOSIS — Q828 Other specified congenital malformations of skin: Secondary | ICD-10-CM

## 2014-12-30 DIAGNOSIS — M216X9 Other acquired deformities of unspecified foot: Secondary | ICD-10-CM | POA: Diagnosis not present

## 2014-12-30 NOTE — Progress Notes (Signed)
   Subjective:    Patient ID: Emily Abbott, female    DOB: September 18, 1940, 74 y.o.   MRN: [id number]  HPI Patient presents with bilateral foot pain, lateral side. On Left foot - looks like a callous. Pt stated, "Left foot hurts more". Going on for the past  2-3 months.  Pt is diabetic Type 2; sugar=163 this am; A1C=6.5   Review of Systems  Constitutional: Positive for fatigue.  Respiratory: Positive for cough, shortness of breath and wheezing.   Cardiovascular: Positive for leg swelling.  Endocrine: Positive for polydipsia.  Musculoskeletal: Positive for myalgias, back pain, arthralgias and gait problem.  Hematological: Bruises/bleeds easily.  All other systems reviewed and are negative.      Objective:   Physical Exam        Assessment & Plan:

## 2015-01-02 NOTE — Progress Notes (Signed)
Subjective:     Patient ID: Emily Abbott, female   DOB: [birth year], 74 y.o.   MRN: [id number]  HPI patient presents with lesion on the bottom of the left foot that's painful and also presents with nail disease 1-5 of both feet that she cannot cut and they're bothersome and they are thick   Review of Systems  All other systems reviewed and are negative.      Objective:   Physical Exam  Constitutional: She is oriented to person, place, and time.  Cardiovascular: Intact distal pulses.   Musculoskeletal: Normal range of motion.  Neurological: She is oriented to person, place, and time. Abnormal reflex: neurovascular status intact muscle strength adequate with range of motion within normal limits. Patient's noted to have.  Skin: Skin is warm and dry.  Nursing note and vitals reviewed.  keratotic lesions on both feet left being worse than right with plantarflexed metatarsal and thick nailbeds 1-5 both feet that are yellow brittle and painful. Good digital perfusion is noted     Assessment:     Mycotic nail infection symptomatic 1-5 both feet and lesion sub-fifth metatarsal left    Plan:     H&P conditions reviewed and debridement accomplished of lesion and underlying nailbeds. Reappoint for review in 3 months or earlier as needed

## 2015-01-03 ENCOUNTER — Ambulatory Visit: Payer: Medicare Other | Admitting: Emergency Medicine

## 2015-01-05 ENCOUNTER — Ambulatory Visit (INDEPENDENT_AMBULATORY_CARE_PROVIDER_SITE_OTHER): Payer: Medicare Other | Admitting: *Deleted

## 2015-01-05 DIAGNOSIS — I429 Cardiomyopathy, unspecified: Secondary | ICD-10-CM | POA: Diagnosis not present

## 2015-01-05 DIAGNOSIS — I428 Other cardiomyopathies: Secondary | ICD-10-CM

## 2015-01-05 NOTE — Progress Notes (Signed)
Remote ICD transmission.   

## 2015-01-20 ENCOUNTER — Encounter: Payer: Self-pay | Admitting: Internal Medicine

## 2015-01-21 ENCOUNTER — Encounter: Payer: Self-pay | Admitting: Internal Medicine

## 2015-01-21 LAB — CUP PACEART REMOTE DEVICE CHECK
Brady Statistic AP VP Percent: 30 %
Brady Statistic RA Percent Paced: 27 %
HighPow Impedance: 68 Ohm
HighPow Impedance: 68 Ohm
Implantable Lead Implant Date: 20110127
Implantable Lead Location: 753859
Lead Channel Impedance Value: 360 Ohm
Lead Channel Pacing Threshold Amplitude: 0.625 V
Lead Channel Pacing Threshold Amplitude: 0.875 V
Lead Channel Pacing Threshold Pulse Width: 0.5 ms
Lead Channel Pacing Threshold Pulse Width: 0.5 ms
Lead Channel Sensing Intrinsic Amplitude: 3 mV
Lead Channel Setting Pacing Amplitude: 2 V
Lead Channel Setting Pacing Pulse Width: 0.5 ms
Lead Channel Setting Sensing Sensitivity: 0.5 mV
MDC IDC LEAD IMPLANT DT: 20110127
MDC IDC LEAD IMPLANT DT: 20110127
MDC IDC LEAD LOCATION: 753858
MDC IDC LEAD LOCATION: 753860
MDC IDC LEAD MODEL: 7122
MDC IDC MSMT BATTERY REMAINING LONGEVITY: 30 mo
MDC IDC MSMT BATTERY REMAINING PERCENTAGE: 33 %
MDC IDC MSMT BATTERY VOLTAGE: 2.86 V
MDC IDC MSMT LEADCHNL LV IMPEDANCE VALUE: 810 Ohm
MDC IDC MSMT LEADCHNL RA PACING THRESHOLD AMPLITUDE: 1 V
MDC IDC MSMT LEADCHNL RA PACING THRESHOLD PULSEWIDTH: 0.5 ms
MDC IDC MSMT LEADCHNL RV IMPEDANCE VALUE: 460 Ohm
MDC IDC MSMT LEADCHNL RV SENSING INTR AMPL: 12 mV
MDC IDC PG SERIAL: 752248
MDC IDC SESS DTM: 20161116072753
MDC IDC SET LEADCHNL LV PACING AMPLITUDE: 2 V
MDC IDC SET LEADCHNL LV PACING PULSEWIDTH: 0.5 ms
MDC IDC SET LEADCHNL RA PACING AMPLITUDE: 2 V
MDC IDC STAT BRADY AP VS PERCENT: 1 %
MDC IDC STAT BRADY AS VP PERCENT: 70 %
MDC IDC STAT BRADY AS VS PERCENT: 1 %

## 2015-01-24 ENCOUNTER — Encounter: Payer: Self-pay | Admitting: Internal Medicine

## 2015-01-24 ENCOUNTER — Ambulatory Visit (INDEPENDENT_AMBULATORY_CARE_PROVIDER_SITE_OTHER): Payer: Medicare Other | Admitting: Internal Medicine

## 2015-01-24 ENCOUNTER — Encounter: Payer: Self-pay | Admitting: Cardiology

## 2015-01-24 VITALS — BP 138/72 | HR 97 | Ht 66.5 in | Wt 212.8 lb

## 2015-01-24 DIAGNOSIS — R911 Solitary pulmonary nodule: Secondary | ICD-10-CM | POA: Diagnosis not present

## 2015-01-24 DIAGNOSIS — J438 Other emphysema: Secondary | ICD-10-CM | POA: Diagnosis not present

## 2015-01-24 DIAGNOSIS — R0600 Dyspnea, unspecified: Secondary | ICD-10-CM | POA: Diagnosis not present

## 2015-01-24 DIAGNOSIS — R490 Dysphonia: Secondary | ICD-10-CM

## 2015-01-24 DIAGNOSIS — R0689 Other abnormalities of breathing: Secondary | ICD-10-CM

## 2015-01-24 NOTE — Patient Instructions (Addendum)
ICD-9-CM ICD-10-CM   1. Dyspnea and respiratory abnormality 786.09 R06.00     R06.89   2. Hoarseness of voice 784.42 R49.0   3. Lung nodule, solitary 793.11 R91.1   4. Other emphysema ([place]) 492.8 J43.8     #Shortness of breath  - follow emhysema directions below and then assess for CPST  #Emphysema lung with nocturnal desaturation  - stop advair due to hoarseness of voice -> start BREO 1 puff daily  - continue spiriva 1 puff daily  - RE-refer pulmonary rehab - comntinue 1L O2 at night - if dyspnea still persists, then do CPST  #Lung nodule right 1cm with negative PET SCAn SEpt 2016 - keep up appt with CT Jan 2017 - nurse will ensure is sUPER D protocol   # Hoarseness of voice  -too bad it persists despite ENT Eval and increaseing your prilosec - for now change advair to breo - if persists, will refer GI for acid reflux  #Snoring and Xs day time sleepiness  - refer split night sleep study  - refer a sleep doctor in our office after sleep study  #followup  = me or NP Tammy in Jan 2017 after CT chest

## 2015-01-24 NOTE — Progress Notes (Signed)
Subjective:     Patient ID: Emily Abbott, female   DOB: [birth year], 74 y.o.   MRN: [id number]  HPI    OV 11/10/2014  Chief Complaint  Patient presents with  . Pulmonary Consult    Self referral for DOE. Pt c/o hoarseness x 1 year. Pt c/o non prod cough with congestion in throat - pt states she cannot bring mucus up. Pt denies CP/tightness.     74 year old female followed by Dr. Harlan Stains primary care physician. She presents to the pulmonary clinic at the insistence of her neice and her daughter were very concerned about her shortness of breath and associated fatigue. Patient reports that she has a history of COPD not otherwise specified and is on Advair inhaler for the last 1 year. At the time she was started on Advair she started noticing a voice change in terms of hoarseness. This is progressively deteriorated since then. This is continued to get worse even after they reduce the dose of Advair discus to the lower dose. The hoarseness present all the time. Apparently she's had 2 evaluations by ENT physician whose name she does not recall and this has been normal laryngoscopy exam according to her history. In addition she is also having exertional shortness of breath associated with fatigue. Fatigue component seems to be higher. 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 The knees believes that she needs to be on oxygen therapy. They're also concerned about pedal edema.  Review of the chart shows she had a normal cardiac stress test in 2007 but in [id number] she had systolic heart failure with ejection fraction 35%. Sometime after this while in [place] she underwent emergency cardiac bypass surgery. Around the time of the bypass surgery possibly before the surgery she did suffer her first and only episode of pulmonary embolism for which she was started on Coumadin therapy. She is now being maintained on Coumadin therapy since then presumably due to atrial fibrillation which  she says is still present on and off. Echocardiogram most recently June 2015 shows improvement of ejection fraction to 55% which she does have significant grade 3 diastolic dysfunction associated with elevated pulmonary artery systolic pressure on echo to 55 mmHg.  Other lab tests - Pulmonary function test: She did have spirometry in her primary care physician office this year and apparently "passed it" - Chest x-ray of 03/24/2013: Personally visualized image her lung fields are clear - Echocardiogram: Last June 2015. Shows diastolic dysfunction and elevated pulmonary artery systolic pressure with left ventricle ejection fraction of 55% - Walking desaturation test 185 feet 3 laps on room air: Did not desaturate below 93%.     OV 11/24/2014  Chief Complaint  Patient presents with  . Follow-up    3 week follow up. Pt states that her breathing is the same. Pt states that she has cough/wheeze. Pt states that she does have mucus but cannot get it up enough to spit it out, she usually ends up swallowing it. Pt denies any CP/tightness. Pt denies any f/n/v/d.    Presents with her daughter [name] - for evaluation of dyspnea/fatigue and hoarseness of voice. She's had several tests in the interim  Overnight desaturation test shows pulse ox dipped less than 88% for several minutes at night  Pulmonary function test 11/11/2014 shows isolated reduction In diffusion capacity to 12.98/40%. Otherwise lung volumes and forced vital capacity and FEV1 are normal.  CT scan chest high resolution 11/15/2014 shows no evidence of interstitial  lung disease. There is paraseptal emphysema and coronary artery calcification.. There is incidental finding of 1 cm right upper lobe nodule  CT scan of the neck 11/15/2014: Does not reveal any specific etiology for chronic hoarseness of voice  Echocardiogram 11/15/2014: Similar to the old one. She has atrial fibrillation. She has elevated pulmonary artery systolic  pressure mid [id number]. Left frontal ejection fraction is 65%. Unclear if there is diastolic dysfunction.    OV 12/17/14 - DR Byrum 74 year old former smoker with history of coronary disease, diabetes, atrophic fibrillation, COPD. She has been evaluated by Dr. Chase Caller for her dyspnea and a high-resolution CT scan of the chest performed on 11/15/14 showed a 1 severe right upper lobe nodule. A subsequent PET scan was performed on 10/19 that I personally reviewed. This showed no evidence for hypermetabolism in the 1 cm nodule. Other scattered smaller nodules were seen.   I have personally reviewed Pulmonary function testing performed on 11/11/14 showed evidence for mild obstruction based on the curvature of the flow-volume loop with an FEV1 of 90% predicted.   REC:  3 month fu with CT   OV 01/24/2015  Chief Complaint  Patient presents with  . Follow-up    2 month follow up. pt states herbreathing is well at times, she has her fgood days and bad days. pt states her SOB is a tad bit better.  pt c/o dry cough and wheezing.    follow-up several issues  #Shortness of breath - This still persists. It has been a while since she attended pulmonary rehabilitation. CT chest does show emphysema and she did have overnight desaturation. She continues on Spiriva and Advair and 1 L oxygen at night.  # New issue. She is concerned about presence of sleep apnea due to overnight desaturation. She wants a sleep study. This was suggested by Dr. Lamonte Sakai is well when she visited with him in October 2016. Epworth score is 8 tpday  #Hoarseness of voice. She tells me that she is attende ENT evaluation and her Prilosec was increased but despite this she continues to have hoarseness of voice. Apparently ENT told her that acid reflux as the cause of her hoarseness of voice. She's never seen a gastroneurologist for this.  #Lung nodule CT chest September 2006 seen 1 cm right upper lobe nodule. PET scan October 2016 no evidence of  hypermetabolism   Current outpatient prescriptions:  .  albuterol (PROAIR HFA) 108 (90 BASE) MCG/ACT inhaler, Use as directed as needed, Disp: , Rfl:  .  ALPRAZolam (XANAX) 0.5 MG tablet, Take 0.5 mg by mouth 2 (two) times daily as needed. , Disp: , Rfl:  .  aspirin 325 MG tablet, Take 325 mg by mouth daily.  , Disp: , Rfl:  .  carvedilol (COREG) 6.25 MG tablet, Take 1.5 tablets (9.375 mg total) by mouth 2 (two) times daily with a meal. (Patient taking differently: Take 6.25 mg by mouth 2 (two) times daily with a meal. Take 1 tablet twice daily), Disp: 270 tablet, Rfl: 3 .  diphenhydrAMINE (BENADRYL) 25 mg capsule, Take 25 mg by mouth at bedtime.  , Disp: , Rfl:  .  dofetilide (TIKOSYN) 250 MCG capsule, Take 250 mcg by mouth 2 (two) times daily., Disp: , Rfl:  .  Fluticasone-Salmeterol (ADVAIR DISKUS) 100-50 MCG/DOSE AEPB, Inhale 1 puff into the lungs 2 (two) times daily.  , Disp: , Rfl:  .  furosemide (LASIX) 20 MG tablet, Take 2 tablets (40 mg total) by mouth daily., Disp: 180  tablet, Rfl: 3 .  glucosamine-chondroitin 500-400 MG tablet, Take 1 tablet by mouth daily., Disp: , Rfl:  .  HYDROcodone-acetaminophen (VICODIN) 5-500 MG per tablet, Take 1 tablet by mouth as needed.  , Disp: , Rfl:  .  magnesium gluconate (MAGONATE) 500 MG tablet, Take 500 mg by mouth daily., Disp: , Rfl:  .  metFORMIN (GLUCOPHAGE) 850 MG tablet, Take 850 mg by mouth 2 (two) times daily with a meal.  , Disp: , Rfl:  .  omeprazole (PRILOSEC) 40 MG capsule, Take 40 mg by mouth daily.  , Disp: , Rfl:  .  PARoxetine (PAXIL) 30 MG tablet, Take 30 mg by mouth daily.  , Disp: , Rfl:  .  tiotropium (SPIRIVA) 18 MCG inhalation capsule, Place 1 capsule (18 mcg total) into inhaler and inhale daily., Disp: 30 capsule, Rfl: 6 .  vitamin B-12 (CYANOCOBALAMIN) 50 MCG tablet, Take 50 mcg by mouth daily., Disp: , Rfl:  .  vitamin E 200 UNIT capsule, Take 200 Units by mouth daily., Disp: , Rfl:  .  VYTORIN 10-40 MG tablet, Take 1 tablet  by mouth daily., Disp: , Rfl: 3 .  warfarin (COUMADIN) 5 MG tablet, Use as directed by Alta Corning, MD , Disp: , Rfl:  .  zolpidem (AMBIEN) 10 MG tablet, Take 5 mg by mouth at bedtime as needed. , Disp: , Rfl:   Allergies  Allergen Reactions  . Sulfonamide Derivatives     Immunization History  Administered Date(s) Administered  . Influenza Split 01/06/2015      Review of Systems According to history of present illness    Objective:   Physical Exam  Constitutional: She is oriented to person, place, and time. She appears well-developed and well-nourished. No distress.  Deconditioned looking  HENT:  Head: Normocephalic and atraumatic.  Right Ear: External ear normal.  Left Ear: External ear normal.  Mouth/Throat: Oropharynx is clear and moist. No oropharyngeal exudate.  hoarse voice present  Eyes: Conjunctivae and EOM are normal. Pupils are equal, round, and reactive to light. Right eye exhibits no discharge. Left eye exhibits no discharge. No scleral icterus.  Neck: Normal range of motion. Neck supple. No JVD present. No tracheal deviation present. No thyromegaly present.  Cardiovascular: Normal rate, regular rhythm, normal heart sounds and intact distal pulses.  Exam reveals no gallop and no friction rub.   No murmur heard. Pulmonary/Chest: Effort normal and breath sounds normal. No respiratory distress. She has no wheezes. She has no rales. She exhibits no tenderness.  Abdominal: Soft. Bowel sounds are normal. She exhibits no distension and no mass. There is no tenderness. There is no rebound and no guarding.  Musculoskeletal: Normal range of motion. She exhibits no edema or tenderness.  Lymphadenopathy:    She has no cervical adenopathy.  Neurological: She is alert and oriented to person, place, and time. She has normal reflexes. No cranial nerve deficit. She exhibits normal muscle tone. Coordination normal.  Skin: Skin is warm and dry. No rash noted. She is not diaphoretic.  No erythema. No pallor.  Psychiatric: She has a normal mood and affect. Her behavior is normal. Judgment and thought content normal.  Vitals reviewed.   Filed Vitals:   01/24/15 1442  BP: 138/72  Pulse: 97  Height: 5' 6.5" (1.689 m)  Weight: 212 lb 12.8 oz (96.525 kg)  SpO2: 95%        Assessment:       ICD-9-CM ICD-10-CM   1. Dyspnea and respiratory abnormality 786.09  R06.00 Pulmonary rehab therapeutic exercise    R06.89 Split night study  2. Hoarseness of voice 784.42 R49.0   3. Lung nodule, solitary 793.11 R91.1   4. Other emphysema ([place]) 492.8 J43.8 Pulmonary rehab therapeutic exercise        Plan:      #Shortness of breath  - follow emhysema directions below and then assess for CPST  #Emphysema lung with nocturnal desaturation  - stop advair due to hoarseness of voice -> start BREO 1 puff daily  - continue spiriva 1 puff daily  - RE-refer pulmonary rehab - comntinue 1L O2 at night -  #Lung nodule right 1cm with negative PET SCAn SEpt 2016 - keep up appt with CT Jan 2017 - nurse will ensure is sUPER D protocol   # Hoarseness of voice  -too bad it persists despite ENT Eval and increaseing your prilosec - for now change advair to breo - if persists, will refer GI for acid reflux  #Snoring and Xs day time sleepiness  - refer split night sleep study  - refer a sleep doctor in our office after sleep study  #followup  = me or NP Tammy in Jan 2017 after CT chest   Dr. Brand Males, M.D., [facility].C.P Pulmonary and Critical Care Medicine Staff Physician [place] Pager: [phone], If no answer or between  15:00h - 7:00h: call [phone]  01/31/2015 12:20 AM

## 2015-02-02 ENCOUNTER — Ambulatory Visit (HOSPITAL_BASED_OUTPATIENT_CLINIC_OR_DEPARTMENT_OTHER): Payer: Medicare Other | Attending: Internal Medicine | Admitting: Radiology

## 2015-02-02 DIAGNOSIS — Z950 Presence of cardiac pacemaker: Secondary | ICD-10-CM | POA: Diagnosis not present

## 2015-02-02 DIAGNOSIS — R51 Headache: Secondary | ICD-10-CM | POA: Diagnosis not present

## 2015-02-02 DIAGNOSIS — I10 Essential (primary) hypertension: Secondary | ICD-10-CM | POA: Diagnosis not present

## 2015-02-02 DIAGNOSIS — R0600 Dyspnea, unspecified: Secondary | ICD-10-CM | POA: Insufficient documentation

## 2015-02-02 DIAGNOSIS — F513 Sleepwalking [somnambulism]: Secondary | ICD-10-CM | POA: Diagnosis not present

## 2015-02-02 DIAGNOSIS — I493 Ventricular premature depolarization: Secondary | ICD-10-CM | POA: Diagnosis not present

## 2015-02-02 DIAGNOSIS — Z6834 Body mass index (BMI) 34.0-34.9, adult: Secondary | ICD-10-CM | POA: Diagnosis not present

## 2015-02-02 DIAGNOSIS — J449 Chronic obstructive pulmonary disease, unspecified: Secondary | ICD-10-CM | POA: Insufficient documentation

## 2015-02-02 DIAGNOSIS — R0683 Snoring: Secondary | ICD-10-CM | POA: Insufficient documentation

## 2015-02-02 DIAGNOSIS — G4733 Obstructive sleep apnea (adult) (pediatric): Secondary | ICD-10-CM | POA: Diagnosis not present

## 2015-02-02 DIAGNOSIS — E669 Obesity, unspecified: Secondary | ICD-10-CM | POA: Insufficient documentation

## 2015-02-02 DIAGNOSIS — E119 Type 2 diabetes mellitus without complications: Secondary | ICD-10-CM | POA: Insufficient documentation

## 2015-02-02 DIAGNOSIS — R0689 Other abnormalities of breathing: Secondary | ICD-10-CM

## 2015-02-02 DIAGNOSIS — R5383 Other fatigue: Secondary | ICD-10-CM | POA: Insufficient documentation

## 2015-02-03 ENCOUNTER — Telehealth: Payer: Self-pay | Admitting: Pulmonary Disease

## 2015-02-03 DIAGNOSIS — G4733 Obstructive sleep apnea (adult) (pediatric): Secondary | ICD-10-CM | POA: Diagnosis not present

## 2015-02-03 NOTE — Telephone Encounter (Signed)
PSG 02/02/15 >> AHI 31.2, SaO2 low 79%.  Will have my nurse inform pt that sleep study shows severe sleep apnea.  Options are 1) CPAP now, 2) ROV first.  If She is agreeable to CPAP, then please send order for auto CPAP range 5 to 15 cm H2O with heated humidity and mask of choice.  Have download sent 1 month after starting CPAP and set up ROV 2 months after starting CPAP >> can be with me or Tammy Parrett.

## 2015-02-03 NOTE — Progress Notes (Signed)
Patient Name: Emily Abbott, Emily Abbott Date: 02/02/2015 Gender: Female D.O.B: [birth year] Age (years): 43 Referring Provider: Brand Males Height (inches): 31 Interpreting Physician: Chesley Mires MD, ABSM Weight (lbs): 212 RPSGT: Laren Everts BMI: 34 MRN: [id number] Neck Size: 16.50  CLINICAL INFORMATION Sleep Study Type: NPSG Indication for sleep study: COPD, Diabetes, Fatigue, Hypertension, Morning Headaches, Obesity, Sleep walking/talking/parasomnias Epworth Sleepiness Score: 6  SLEEP STUDY TECHNIQUE As per the AASM Manual for the Scoring of Sleep and Associated Events v2.3 (April 2016) with a hypopnea requiring 4% desaturations. The channels recorded and monitored were frontal, central and occipital EEG, electrooculogram (EOG), submentalis EMG (chin), nasal and oral airflow, thoracic and abdominal wall motion, anterior tibialis EMG, snore microphone, electrocardiogram, and pulse oximetry.  MEDICATIONS Patient's medications include: reviewed in electronic medical record. Medications self-administered by patient during sleep study : No sleep medicine administered.  SLEEP ARCHITECTURE The study was initiated at 11:14:59 PM and ended at 5:11:44 AM. Sleep onset time was 38.4 minutes and the sleep efficiency was 51.7%. The total sleep time was 184.5 minutes. Stage REM latency was N/A minutes. The patient spent 48.51% of the night in stage N1 sleep, 51.49% in stage N2 sleep, 0.00% in stage N3 and 0.00% in REM. Alpha intrusion was absent. Supine sleep was 0.00%. RESPIRATORY PARAMETERS The overall apnea/hypopnea index (AHI) was 31.2 per hour. There were 1 total apneas, including 1 obstructive, 0 central and 0 mixed apneas. There were 95 hypopneas and 71 RERAs. The AHI during Stage REM sleep was N/A per hour. AHI while supine was N/A per hour. The mean oxygen saturation was 86.95%. The minimum SpO2 during sleep was 79.00%. Moderate snoring was noted during this study.  CARDIAC  DATA The 2 lead EKG demonstrated pacemaker generated. The mean heart rate was 67.84 beats per minute. Other EKG findings include: PVCs.  LEG MOVEMENT DATA The total PLMS were 27 with a resulting PLMS index of 8.78. Associated arousal with leg movement index was 1.3 .  IMPRESSIONS This study shows moderate obstructive sleep apnea with an AHI of 31.2 and SaO2 low of 79%.  DIAGNOSIS - Obstructive Sleep Apnea (327.23 [G47.33 ICD-10])  RECOMMENDATIONS Additional therapeutic options include weight loss, CPAP, oral appliance, or surgical assessment.   Chesley Mires, MD, [facility], American Board of Sleep Medicine 02/03/2015, 5:10 PM  NPI: [id number]

## 2015-02-04 NOTE — Telephone Encounter (Signed)
Spoke with pt. She would like to go ahead and set up CPAP. Order has been placed. Will call back for ROV.

## 2015-02-07 ENCOUNTER — Telehealth (HOSPITAL_COMMUNITY): Payer: Self-pay

## 2015-02-07 NOTE — Telephone Encounter (Signed)
Called patient to discuss Pulmonary Rehab in regards to Dr. Golden Pop referral. Patient is interested but unable to afford her $40 copay per session.

## 2015-02-11 ENCOUNTER — Telehealth: Payer: Self-pay | Admitting: Internal Medicine

## 2015-02-11 MED ORDER — FLUTICASONE FUROATE-VILANTEROL 100-25 MCG/INH IN AEPB: 1.0000 | INHALATION_SPRAY | Freq: Every day | RESPIRATORY_TRACT | Status: DC

## 2015-02-11 NOTE — Telephone Encounter (Signed)
Patient calling to get samples of Breo 100. Samples left at front for patient to pick up. Patient notified. Nothing further needed.

## 2015-02-11 NOTE — Telephone Encounter (Signed)
lmtcb x1 for pt. 

## 2015-02-22 ENCOUNTER — Encounter: Payer: Self-pay | Admitting: Adult Health

## 2015-02-22 ENCOUNTER — Ambulatory Visit (INDEPENDENT_AMBULATORY_CARE_PROVIDER_SITE_OTHER): Payer: Medicare Other | Admitting: Adult Health

## 2015-02-22 VITALS — BP 132/80 | HR 72 | Temp 98.1°F | Ht 66.5 in | Wt 211.8 lb

## 2015-02-22 DIAGNOSIS — G4733 Obstructive sleep apnea (adult) (pediatric): Secondary | ICD-10-CM

## 2015-02-22 NOTE — Progress Notes (Signed)
Subjective:    Patient ID: Emily Abbott, female    DOB: 04-26-1940, 75 y.o.   MRN: [id number]  HPI 75 year old female followed by Dr. Chase Caller for emphysema and lung nodule    TEST  PSG 02/02/15 >> AHI 31.2, SaO2 low 79%.   02/22/2015 follow-up sleep apnea Patient had persistent daytime sleepiness and fatigue. She was set up for a sleep study on December 14. Split-night sleep study showed a AHI at 31.2, SaO2 low 79%. We discussed her sleep test results with severe sleep apnea. We discussed treatment options including C Pap. Patient would like to proceed with a C Pap machine. She denies any chest pain, orthopnea, PND, or increased leg swelling.  Past Medical History  Diagnosis Date  . Paroxysmal atrial fibrillation (HCC)   . CAD (coronary artery disease)   . HTN (hypertension)     essential  . Hyperlipidemia, mixed   . Diabetes mellitus     type II  . Mitral valve disorder   . GERD (gastroesophageal reflux disease)   . Pulmonary embolism ([place])   . Anemia   . COPD (chronic obstructive pulmonary disease) ([place])   . Osteoarthritis   . Anxiety   . Panic attack    Current Outpatient Prescriptions on File Prior to Visit  Medication Sig Dispense Refill  . albuterol (PROAIR HFA) 108 (90 BASE) MCG/ACT inhaler Use as directed as needed    . ALPRAZolam (XANAX) 0.5 MG tablet Take 0.5 mg by mouth 2 (two) times daily as needed.     Marland Kitchen aspirin 325 MG tablet Take 325 mg by mouth daily.      . carvedilol (COREG) 6.25 MG tablet Take 1.5 tablets (9.375 mg total) by mouth 2 (two) times daily with a meal. (Patient taking differently: Take 6.25 mg by mouth 2 (two) times daily with a meal. Take 1 tablet twice daily) 270 tablet 3  . diphenhydrAMINE (BENADRYL) 25 mg capsule Take 25 mg by mouth at bedtime.      . dofetilide (TIKOSYN) 250 MCG capsule Take 250 mcg by mouth 2 (two) times daily.    . furosemide (LASIX) 20 MG tablet Take 2 tablets (40 mg total) by mouth daily. 180 tablet 3  .  glucosamine-chondroitin 500-400 MG tablet Take 1 tablet by mouth daily.    Marland Kitchen HYDROcodone-acetaminophen (VICODIN) 5-500 MG per tablet Take 1 tablet by mouth as needed.      . magnesium gluconate (MAGONATE) 500 MG tablet Take 500 mg by mouth daily.    . metFORMIN (GLUCOPHAGE) 850 MG tablet Take 850 mg by mouth 2 (two) times daily with a meal.      . omeprazole (PRILOSEC) 40 MG capsule Take 40 mg by mouth daily.      Marland Kitchen PARoxetine (PAXIL) 30 MG tablet Take 30 mg by mouth daily.      Marland Kitchen tiotropium (SPIRIVA) 18 MCG inhalation capsule Place 1 capsule (18 mcg total) into inhaler and inhale daily. 30 capsule 6  . vitamin B-12 (CYANOCOBALAMIN) 50 MCG tablet Take 50 mcg by mouth daily.    . vitamin E 200 UNIT capsule Take 200 Units by mouth daily.    Marland Kitchen VYTORIN 10-40 MG tablet Take 1 tablet by mouth daily.  3  . warfarin (COUMADIN) 5 MG tablet Use as directed by Alta Corning, MD     . zolpidem (AMBIEN) 10 MG tablet Take 5 mg by mouth at bedtime as needed.     . Fluticasone Furoate-Vilanterol (BREO ELLIPTA) 100-25 MCG/INH AEPB Inhale  1 puff into the lungs daily. (Patient not taking: Reported on 02/22/2015) 2 each 0  . Fluticasone-Salmeterol (ADVAIR DISKUS) 100-50 MCG/DOSE AEPB Inhale 1 puff into the lungs 2 (two) times daily. Reported on 02/22/2015     No current facility-administered medications on file prior to visit.     Review of Systems Constitutional:   No  weight loss, night sweats,  Fevers, chills, + fatigue, or  lassitude.  HEENT:   No headaches,  Difficulty swallowing,  Tooth/dental problems, or  Sore throat,                No sneezing, itching, ear ache, nasal congestion, post nasal drip,   CV:  No chest pain,  Orthopnea, PND, swelling in lower extremities, anasarca, dizziness, palpitations, syncope.   GI  No heartburn, indigestion, abdominal pain, nausea, vomiting, diarrhea, change in bowel habits, loss of appetite, bloody stools.   Resp: No shortness of breath with exertion or at rest.  No  excess mucus, no productive cough,  No non-productive cough,  No coughing up of blood.  No change in color of mucus.  No wheezing.  No chest wall deformity  Skin: no rash or lesions.  GU: no dysuria, change in color of urine, no urgency or frequency.  No flank pain, no hematuria   MS:  No joint pain or swelling.  No decreased range of motion.  No back pain.  Psych:  No change in mood or affect. No depression or anxiety.  No memory loss.         Objective:   Physical Exam  Filed Vitals:   02/22/15 1505  BP: 132/80  Pulse: 72  Temp: 98.1 F (36.7 C)  TempSrc: Oral  Height: 5' 6.5" (1.689 m)  Weight: 211 lb 12.8 oz (96.072 kg)  SpO2: 95%   GEN: A/Ox3; pleasant , NAD, eldelry   HEENT:  [place]/AT,  EACs-clear, TMs-wnl, NOSE-clear, THROAT-clear, no lesions, no postnasal drip or exudate noted. Class 2-3 MP airway   NECK:  Supple w/ fair ROM; no JVD; normal carotid impulses w/o bruits; no thyromegaly or nodules palpated; no lymphadenopathy.  RESP  Clear  P & A; w/o, wheezes/ rales/ or rhonchi.no accessory muscle use, no dullness to percussion  CARD:  RRR, no m/r/g  , no peripheral edema, pulses intact, no cyanosis or clubbing.  GI:   Soft & nt; nml bowel sounds; no organomegaly or masses detected.  Musco: Warm bil, no deformities or joint swelling noted.   Neuro: alert, no focal deficits noted.    Skin: Warm, no lesions or rashes       Assessment & Plan:

## 2015-02-22 NOTE — Assessment & Plan Note (Signed)
Severe sleep apnea noted on split-night sleep study with an AHI 31.2 Patient is to begin nocturnal C Pap.  Plan  Begin CPAP At bedtime   Goal is to wear at least 4-6 hrs each night.  Work on weight loss.  CPAP download in 1 month after starting.  Follow up with Dr. Halford Chessman  In 2-3 months and As needed

## 2015-02-22 NOTE — Progress Notes (Signed)
Reviewed and agree with assessment/plan. 

## 2015-02-22 NOTE — Patient Instructions (Signed)
Begin CPAP At bedtime   Goal is to wear at least 4-6 hrs each night.  Work on weight loss.  CPAP download in 1 month after starting.  Follow up with Dr. Halford Chessman  In 2-3 months and As needed

## 2015-02-28 ENCOUNTER — Telehealth (HOSPITAL_COMMUNITY): Payer: Self-pay | Admitting: *Deleted

## 2015-03-01 ENCOUNTER — Telehealth: Payer: Self-pay | Admitting: Internal Medicine

## 2015-03-01 NOTE — Telephone Encounter (Signed)
Per 12.5.16 ov w/ MR: Patient Instructions           ICD-9-CM  ICD-10-CM     1.  Dyspnea and respiratory abnormality  786.09  R06.00         R06.89     2.  Hoarseness of voice  784.42  R49.0     3.  Lung nodule, solitary  793.11  R91.1     4.  Other emphysema ([place])  492.8  J43.8       #Shortness of breath  - follow emhysema directions below and then assess for CPST  #Emphysema lung with nocturnal desaturation  - stop advair due to hoarseness of voice -> start BREO 1 puff daily  - continue spiriva 1 puff daily  - RE-refer pulmonary rehab - comntinue 1L O2 at night - if dyspnea still persists, then do CPST  #Lung nodule right 1cm with negative PET SCAn SEpt 2016 - keep up appt with CT Jan 2017 - nurse will ensure is sUPER D protocol   # Hoarseness of voice  -too bad it persists despite ENT Eval and increaseing your prilosec - for now change advair to breo - if persists, will refer GI for acid reflux  #Snoring and Xs day time sleepiness  - refer split night sleep study  - refer a sleep doctor in our office after sleep study  #followup  = me or NP Tammy in Jan 2017 after CT chest   lmtcb x1 for pt to verify that the Memory [facility] is working well for her and the pharmacy

## 2015-03-02 MED ORDER — FLUTICASONE FUROATE-VILANTEROL 100-25 MCG/INH IN AEPB: 1.0000 | INHALATION_SPRAY | Freq: Every day | RESPIRATORY_TRACT | Status: DC

## 2015-03-02 NOTE — Telephone Encounter (Signed)
Spoke with pt. States that [place] worked well for her, she was only given 1 sample. She has since ran out and will need a prescription sent in. This has been done. Nothing further was needed.

## 2015-03-22 ENCOUNTER — Ambulatory Visit (INDEPENDENT_AMBULATORY_CARE_PROVIDER_SITE_OTHER)
Admission: RE | Admit: 2015-03-22 | Discharge: 2015-03-22 | Disposition: A | Discharge status: Home / Self Care | Payer: Medicare Other | Source: Ambulatory Visit | Attending: Emergency Medicine | Admitting: Emergency Medicine

## 2015-03-22 DIAGNOSIS — R911 Solitary pulmonary nodule: Secondary | ICD-10-CM | POA: Diagnosis not present

## 2015-04-01 ENCOUNTER — Telehealth: Payer: Self-pay | Admitting: Internal Medicine

## 2015-04-01 MED ORDER — TIOTROPIUM BROMIDE MONOHYDRATE 18 MCG IN CAPS: 18.0000 ug | ORAL_CAPSULE | Freq: Every day | RESPIRATORY_TRACT | Status: DC

## 2015-04-01 MED ORDER — FLUTICASONE FUROATE-VILANTEROL 100-25 MCG/INH IN AEPB: 1.0000 | INHALATION_SPRAY | Freq: Every day | RESPIRATORY_TRACT | Status: DC

## 2015-04-01 NOTE — Telephone Encounter (Signed)
Called spoke with pt. She needs refill on spiriva and breo. I have sent this in. Nothing further needed

## 2015-04-05 ENCOUNTER — Encounter: Payer: Self-pay | Admitting: Podiatry

## 2015-04-05 ENCOUNTER — Ambulatory Visit (INDEPENDENT_AMBULATORY_CARE_PROVIDER_SITE_OTHER): Payer: Medicare Other | Admitting: Podiatry

## 2015-04-05 DIAGNOSIS — M79673 Pain in unspecified foot: Secondary | ICD-10-CM | POA: Diagnosis not present

## 2015-04-05 DIAGNOSIS — B351 Tinea unguium: Secondary | ICD-10-CM

## 2015-04-05 DIAGNOSIS — E1151 Type 2 diabetes mellitus with diabetic peripheral angiopathy without gangrene: Secondary | ICD-10-CM | POA: Diagnosis not present

## 2015-04-05 DIAGNOSIS — Q828 Other specified congenital malformations of skin: Secondary | ICD-10-CM | POA: Diagnosis not present

## 2015-04-05 NOTE — Progress Notes (Signed)
Patient ID: Emily Abbott, female   DOB: 1940/07/09, 75 y.o.   MRN: [id number] Complaint:  Visit Type: Patient returns to my office for continued preventative foot care services. Complaint: Patient states" my nails have grown long and thick and become painful to walk and wear shoes" Patient has been diagnosed with DM with no foot complications. The patient presents for preventative foot care services. No changes to ROS  Podiatric Exam: Vascular: dorsalis pedis and posterior tibial pulses are palpable bilateral. Capillary return is immediate. Temperature gradient is WNL. Skin turgor WNL  Sensorium: Normal Semmes Weinstein monofilament test. Normal tactile sensation bilaterally. Nail Exam: Pt has thick disfigured discolored nails with subungual debris noted bilateral entire nail hallux through fifth toenails Ulcer Exam: There is no evidence of ulcer or pre-ulcerative changes or infection. Orthopedic Exam: Muscle tone and strength are WNL. No limitations in general ROM. No crepitus or effusions noted. Foot type and digits show no abnormalities. Bony prominences are unremarkable. Skin: Porokeratosis sub 5th left foot. No infection or ulcers  Diagnosis:  Onychomycosis, , Pain in right toe, pain in left toes, Porokeratosis Sub 5th left foot.  Treatment & Plan Procedures and Treatment: Consent by patient was obtained for treatment procedures. The patient understood the discussion of treatment and procedures well. All questions were answered thoroughly reviewed. Debridement of mycotic and hypertrophic toenails, 1 through 5 bilateral and clearing of subungual debris. No ulceration, no infection noted. Debride porokeratosis. Return Visit-Office Procedure: Patient instructed to return to the office for a follow up visit 3 months for continued evaluation and treatment.    Gardiner Barefoot DPM

## 2015-04-06 ENCOUNTER — Ambulatory Visit (INDEPENDENT_AMBULATORY_CARE_PROVIDER_SITE_OTHER): Payer: Medicare Other | Admitting: *Deleted

## 2015-04-06 ENCOUNTER — Telehealth: Payer: Self-pay | Admitting: Cardiology

## 2015-04-06 DIAGNOSIS — I429 Cardiomyopathy, unspecified: Secondary | ICD-10-CM | POA: Diagnosis not present

## 2015-04-06 DIAGNOSIS — I428 Other cardiomyopathies: Secondary | ICD-10-CM

## 2015-04-06 NOTE — Telephone Encounter (Signed)
LMOVM reminding pt to send remote transmission.   

## 2015-04-08 ENCOUNTER — Encounter: Payer: Self-pay | Admitting: Cardiology

## 2015-04-11 ENCOUNTER — Telehealth: Payer: Self-pay | Admitting: Cardiology

## 2015-04-11 NOTE — Telephone Encounter (Signed)
LMOVM requesting pt send a manual transmission using home monitor b/c home monitor has not updated in at least 8 days.

## 2015-04-12 NOTE — Telephone Encounter (Signed)
Patient returned Barbara's call about sending a manual transmission. Verbal instructions given on how to send the transmission. Patient attempted to transmit, but it was not successful. Patient was encouraged to call tech support for further assistance. I told her that she should ask them for a cellular adaptor if they are unable to get the transmission to go through. Patient voiced understanding.

## 2015-04-13 NOTE — Telephone Encounter (Signed)
Samples were not picked up.  Placed back into stock.

## 2015-04-15 NOTE — Progress Notes (Signed)
Remote ICD transmission.   

## 2015-04-20 ENCOUNTER — Encounter: Payer: Self-pay | Admitting: Cardiology

## 2015-04-20 LAB — CUP PACEART REMOTE DEVICE CHECK
Battery Remaining Percentage: 29 %
Brady Statistic AP VP Percent: 28 %
Brady Statistic AS VP Percent: 71 %
Brady Statistic AS VS Percent: 1 %
Date Time Interrogation Session: 20170215210719
HIGH POWER IMPEDANCE MEASURED VALUE: 69 Ohm
HighPow Impedance: 69 Ohm
Implantable Lead Implant Date: 20110127
Implantable Lead Location: 753859
Implantable Lead Model: 7122
Lead Channel Impedance Value: 390 Ohm
Lead Channel Pacing Threshold Amplitude: 0.75 V
Lead Channel Pacing Threshold Amplitude: 1 V
Lead Channel Pacing Threshold Pulse Width: 0.5 ms
Lead Channel Sensing Intrinsic Amplitude: 3.2 mV
Lead Channel Setting Pacing Amplitude: 2 V
Lead Channel Setting Pacing Pulse Width: 0.5 ms
Lead Channel Setting Pacing Pulse Width: 0.5 ms
Lead Channel Setting Sensing Sensitivity: 0.5 mV
MDC IDC LEAD IMPLANT DT: 20110127
MDC IDC LEAD IMPLANT DT: 20110127
MDC IDC LEAD LOCATION: 753858
MDC IDC LEAD LOCATION: 753860
MDC IDC MSMT BATTERY REMAINING LONGEVITY: 28 mo
MDC IDC MSMT BATTERY VOLTAGE: 2.84 V
MDC IDC MSMT LEADCHNL LV IMPEDANCE VALUE: 1050 Ohm
MDC IDC MSMT LEADCHNL LV PACING THRESHOLD PULSEWIDTH: 0.5 ms
MDC IDC MSMT LEADCHNL RA PACING THRESHOLD PULSEWIDTH: 0.5 ms
MDC IDC MSMT LEADCHNL RV IMPEDANCE VALUE: 480 Ohm
MDC IDC MSMT LEADCHNL RV PACING THRESHOLD AMPLITUDE: 0.625 V
MDC IDC SET LEADCHNL LV PACING AMPLITUDE: 2 V
MDC IDC SET LEADCHNL RA PACING AMPLITUDE: 2 V
MDC IDC STAT BRADY AP VS PERCENT: 1 %
MDC IDC STAT BRADY RA PERCENT PACED: 24 %
Pulse Gen Serial Number: 752248

## 2015-05-03 ENCOUNTER — Other Ambulatory Visit: Payer: Self-pay | Admitting: Internal Medicine

## 2015-05-18 ENCOUNTER — Ambulatory Visit: Payer: Medicare Other | Admitting: Internal Medicine

## 2015-05-24 ENCOUNTER — Ambulatory Visit: Payer: Medicare Other | Admitting: Pulmonary Disease

## 2015-05-25 ENCOUNTER — Encounter: Payer: Self-pay | Admitting: Internal Medicine

## 2015-05-25 ENCOUNTER — Ambulatory Visit (INDEPENDENT_AMBULATORY_CARE_PROVIDER_SITE_OTHER): Payer: Medicare Other | Admitting: Internal Medicine

## 2015-05-25 VITALS — BP 100/62 | HR 62 | Ht 66.5 in | Wt 206.2 lb

## 2015-05-25 DIAGNOSIS — R911 Solitary pulmonary nodule: Secondary | ICD-10-CM

## 2015-05-25 DIAGNOSIS — R0689 Other abnormalities of breathing: Secondary | ICD-10-CM

## 2015-05-25 DIAGNOSIS — R0600 Dyspnea, unspecified: Secondary | ICD-10-CM | POA: Diagnosis not present

## 2015-05-25 NOTE — Patient Instructions (Addendum)
ICD-9-CM ICD-10-CM   1. Dyspnea and respiratory abnormality 786.09 R06.00     R06.89   2. Hoarseness of voice 784.42 R49.0   3. Lung nodule, solitary 793.11 R91.1   4. Other emphysema (HCC) 492.8 J43.8     #Shortness of breath - due to emphysema but body deconditioning, weight and joint pain issus also making it worse  - your joint issues preclude you from rehab and cpst test -so in some ways we just have to accept some level of shortness of breath  #Emphysema lung with nocturnal desaturation  -  BREO 1 puff daily and continue spiriva 1 puff daily  - continue 1L O2 at night   #Lung nodule right 1cm with negative PET SCAn SEpt 2016 and unchanged Jan 2017 - repeat CT chest  End may/early-June 2017 - super D protocol   # Hoarseness of voice  -too bad it persists despite ENT Eval and increaseing your prilosec and despite BReo - if persists, refer GI for acid reflux evaluation  #Snoring and Xs day time sleepiness  -rper Dr Halford Chessman in our office - nurse to help you now 05/25/2015 with supply questions  #followup  = me or NP Tammy in May/June 2017 after CT chest

## 2015-05-25 NOTE — Progress Notes (Signed)
Subjective:     Patient ID: Emily Abbott, female   DOB: 07/12/1940, 75 y.o.   MRN: [id number] PCP Vidal Schwalbe, MD    HPI   OV 11/10/2014  Chief Complaint  Patient presents with  . Pulmonary Consult    Self referral for DOE. Pt c/o hoarseness x 1 year. Pt c/o non prod cough with congestion in throat - pt states she cannot bring mucus up. Pt denies CP/tightness.     75 year old female followed by Dr. Harlan Stains primary care physician. She presents to the pulmonary clinic at the insistence of her neice and her daughter were very concerned about her shortness of breath and associated fatigue. Patient reports that she has a history of COPD not otherwise specified and is on Advair inhaler for the last 1 year. At the time she was started on Advair she started noticing a voice change in terms of hoarseness. This is progressively deteriorated since then. This is continued to get worse even after they reduce the dose of Advair discus to the lower dose. The hoarseness present all the time. Apparently she's had 2 evaluations by ENT physician whose name she does not recall and this has been normal laryngoscopy exam according to her history. In addition she is also having exertional shortness of breath associated with fatigue. Fatigue component seems to be higher. 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 The knees believes that she needs to be on oxygen therapy. They're also concerned about pedal edema.  Review of the chart shows she had a normal cardiac stress test in 2007 but in [id number] she had systolic heart failure with ejection fraction 35%. Sometime after this while in [place] she underwent emergency cardiac bypass surgery. Around the time of the bypass surgery possibly before the surgery she did suffer her first and only episode of pulmonary embolism for which she was started on Coumadin therapy. She is now being maintained on Coumadin therapy since then presumably due to  atrial fibrillation which she says is still present on and off. Echocardiogram most recently June 2015 shows improvement of ejection fraction to 55% which she does have significant grade 3 diastolic dysfunction associated with elevated pulmonary artery systolic pressure on echo to 55 mmHg.  Other lab tests - Pulmonary function test: She did have spirometry in her primary care physician office this year and apparently "passed it" - Chest x-ray of 03/24/2013: Personally visualized image her lung fields are clear - Echocardiogram: Last June 2015. Shows diastolic dysfunction and elevated pulmonary artery systolic pressure with left ventricle ejection fraction of 55% - Walking desaturation test 185 feet 3 laps on room air: Did not desaturate below 93%.     OV 11/24/2014  Chief Complaint  Patient presents with  . Follow-up    3 week follow up. Pt states that her breathing is the same. Pt states that she has cough/wheeze. Pt states that she does have mucus but cannot get it up enough to spit it out, she usually ends up swallowing it. Pt denies any CP/tightness. Pt denies any f/n/v/d.    Presents with her daughter [name] - for evaluation of dyspnea/fatigue and hoarseness of voice. She's had several tests in the interim  Overnight desaturation test shows pulse ox dipped less than 88% for several minutes at night  Pulmonary function test 11/11/2014 shows isolated reduction In diffusion capacity to 12.98/40%. Otherwise lung volumes and forced vital capacity and FEV1 are normal.  CT scan chest high resolution 11/15/2014  shows no evidence of interstitial lung disease. There is paraseptal emphysema and coronary artery calcification.. There is incidental finding of 1 cm right upper lobe nodule  CT scan of the neck 11/15/2014: Does not reveal any specific etiology for chronic hoarseness of voice  Echocardiogram 11/15/2014: Similar to the old one. She has atrial fibrillation. She has elevated  pulmonary artery systolic pressure mid [id number]. Left frontal ejection fraction is 65%. Unclear if there is diastolic dysfunction.    OV 12/17/14 - DR Byrum 75 year old former smoker with history of coronary disease, diabetes, atrophic fibrillation, COPD. She has been evaluated by Dr. Chase Caller for her dyspnea and a high-resolution CT scan of the chest performed on 11/15/14 showed a 1 severe right upper lobe nodule. A subsequent PET scan was performed on 10/19 that I personally reviewed. This showed no evidence for hypermetabolism in the 1 cm nodule. Other scattered smaller nodules were seen.   I have personally reviewed Pulmonary function testing performed on 11/11/14 showed evidence for mild obstruction based on the curvature of the flow-volume loop with an FEV1 of 90% predicted.   REC:  3 month fu with CT   OV 01/24/2015  Chief Complaint  Patient presents with  . Follow-up    2 month follow up. pt states herbreathing is well at times, she has her fgood days and bad days. pt states her SOB is a tad bit better.  pt c/o dry cough and wheezing.    follow-up several issues  #Shortness of breath - This still persists. It has been a while since she attended pulmonary rehabilitation. CT chest does show emphysema and she did have overnight desaturation. She continues on Spiriva and Advair and 1 L oxygen at night.  # New issue. She is concerned about presence of sleep apnea due to overnight desaturation. She wants a sleep study. This was suggested by Dr. Lamonte Sakai is well when she visited with him in October 2016. Epworth score is 8 tpday  #Hoarseness of voice. She tells me that she is attende ENT evaluation and her Prilosec was increased but despite this she continues to have hoarseness of voice. Apparently ENT told her that acid reflux as the cause of her hoarseness of voice. She's never seen a gastroneurologist for this.  #Lung nodule CT chest September 2006 seen 1 cm right upper lobe nodule. PET scan  October 2016 no evidence of hypermetabolism    OV 05/25/2015  Chief Complaint  Patient presents with  . Follow-up    Pt had HRCT in 02/2015. Pt states her breathing is unchanged since last OV. Pt c/o PND and non prod cough.    Follow-up  - Emphysema: She is now on Spiriva and Brio. These are helping her. But nevertheless dyspnea is not better   - Multifactorial dyspnea: Despite Spiriva and Brio and nocturnal oxygen. She feels that dyspnea is worse. Walking makes it worse. Rest. I walked 185 feet 3 laps on room air with a full head probe but she was only able to walk 2 laps. She had significant antalgic gait and was wobbling. And she stopped because of knee pain and back pain. She does not think she can do pulmonary rehabilitation or stress testing because of this  -Right upper lobe 10  millimeter nodule. This is PET negative. Follow-up CT Chest generally 2017 shows continued stability. Further surveillance is recommended. She is a little bit upset that I did not call her with the results. Found out it was not ordered under my name.  I apologize.  -Hoarseness - still +. Says ENT told told her is due to GERD. She os up on ppi but still not btter. She is open to GI referral   IMPRESSION: 1. Dominant basilar right upper lobe 10 mm pulmonary nodule, for which 4 month stability has been demonstrated, and which was non hypermetabolic on A999333 PET-CT. If definitive tissue diagnosis is not obtained, chest CT surveillance is advised in 3-6 months. 2. Numerous (greater than 20) additional scattered tiny pulmonary nodules throughout the periphery of both lungs, for which 4 month stability has been demonstrated. 3. No thoracic lymphadenopathy. 4. Stable mildly dilated main pulmonary artery suggesting chronic pulmonary arterial hypertension. 5. Additional findings include coronary atherosclerosis, mild diffuse hepatic steatosis and right adrenal adenoma.   Electronically Signed  By:  Ilona Sorrel M.D.  On: 03/22/2015 14:52    has a past medical history of Paroxysmal atrial fibrillation ([place]); CAD (coronary artery disease); HTN (hypertension); Hyperlipidemia, mixed; Diabetes mellitus; Mitral valve disorder; GERD (gastroesophageal reflux disease); Pulmonary embolism ([place]); Anemia; COPD (chronic obstructive pulmonary disease) ([place]); Osteoarthritis; Anxiety; and Panic attack.   reports that she quit smoking about 14 years ago. Her smoking use included Cigarettes. She has a 50 pack-year smoking history. She has never used smokeless tobacco.  Past Surgical History  Procedure Laterality Date  . Btl    . Biopsy breast    . Carpal tennel  2003  . Cardiac valve surgery      mitral    Allergies  Allergen Reactions  . Sulfonamide Derivatives     Immunization History  Administered Date(s) Administered  . Influenza Split 01/06/2015  . Pneumococcal-Unspecified 02/19/2010    Family History  Problem Relation Age of Onset  . Stroke Mother   . Anuerysm Father   . Diabetes Sister   . Cancer Brother   . Ulcers Sister      Current outpatient prescriptions:  .  albuterol (PROAIR HFA) 108 (90 BASE) MCG/ACT inhaler, Use as directed as needed, Disp: , Rfl:  .  ALPRAZolam (XANAX) 0.5 MG tablet, Take 0.5 mg by mouth 2 (two) times daily as needed. , Disp: , Rfl:  .  amLODipine (NORVASC) 5 MG tablet, Take 5 mg by mouth daily., Disp: , Rfl:  .  aspirin 325 MG tablet, Take 325 mg by mouth daily.  , Disp: , Rfl:  .  carvedilol (COREG) 6.25 MG tablet, Take 1.5 tablets (9.375 mg total) by mouth 2 (two) times daily with a meal. (Patient taking differently: Take 6.25 mg by mouth 2 (two) times daily with a meal. Take 1 tablet twice daily), Disp: 270 tablet, Rfl: 3 .  diphenhydrAMINE (BENADRYL) 25 mg capsule, Take 25 mg by mouth at bedtime.  , Disp: , Rfl:  .  dofetilide (TIKOSYN) 250 MCG capsule, TAKE 1 CAPSULE BY MOUTH TWICE DAILY, Disp: 60 capsule, Rfl: 2 .  Fluticasone  Furoate-Vilanterol (BREO ELLIPTA) 100-25 MCG/INH AEPB, Inhale 1 puff into the lungs daily., Disp: 60 each, Rfl: 5 .  Fluticasone-Salmeterol (ADVAIR DISKUS) 100-50 MCG/DOSE AEPB, Inhale 1 puff into the lungs 2 (two) times daily. Reported on 02/22/2015, Disp: , Rfl:  .  furosemide (LASIX) 20 MG tablet, Take 2 tablets (40 mg total) by mouth daily., Disp: 180 tablet, Rfl: 3 .  glucosamine-chondroitin 500-400 MG tablet, Take 1 tablet by mouth daily., Disp: , Rfl:  .  HYDROcodone-acetaminophen (VICODIN) 5-500 MG per tablet, Take 1 tablet by mouth as needed.  , Disp: , Rfl:  .  magnesium gluconate (  MAGONATE) 500 MG tablet, Take 500 mg by mouth daily., Disp: , Rfl:  .  metFORMIN (GLUCOPHAGE) 850 MG tablet, Take 850 mg by mouth 2 (two) times daily with a meal.  , Disp: , Rfl:  .  omeprazole (PRILOSEC) 40 MG capsule, Take 40 mg by mouth daily.  , Disp: , Rfl:  .  PARoxetine (PAXIL) 30 MG tablet, Take 30 mg by mouth daily.  , Disp: , Rfl:  .  tiotropium (SPIRIVA) 18 MCG inhalation capsule, Place 1 capsule (18 mcg total) into inhaler and inhale daily., Disp: 30 capsule, Rfl: 6 .  vitamin B-12 (CYANOCOBALAMIN) 50 MCG tablet, Take 50 mcg by mouth daily., Disp: , Rfl:  .  vitamin E 200 UNIT capsule, Take 200 Units by mouth daily., Disp: , Rfl:  .  VYTORIN 10-40 MG tablet, Take 1 tablet by mouth daily., Disp: , Rfl: 3 .  warfarin (COUMADIN) 5 MG tablet, Use as directed by Alta Corning, MD , Disp: , Rfl:  .  zolpidem (AMBIEN) 10 MG tablet, Take 5 mg by mouth at bedtime as needed. , Disp: , Rfl:      Review of Systems Per hpi    Objective:   Physical Exam Filed Vitals:   05/25/15 1537  BP: 100/62  Pulse: 62  Height: 5' 6.5" (1.689 m)  Weight: 206 lb 3.2 oz (93.532 kg)  SpO2: 96%   General : Deconditioned looking female Muscular skeletal: Antalgic gait. Had significant difficulty walking 185 feet 2 laps. She was wobbling Psychiatric: Anxious and flat affect. Poor historian CNS.: Alert and oriented  3. Speech normal. Respiratory exam: Clear to auscultation bilaterally Cardiovascular: Normal heart sounds Abd: soft. Non tender Ext: no cyanosis. No clubbing.No edema    w   Assessment:       ICD-9-CM ICD-10-CM   1. Nodule of right lung 793.11 R91.1   2. Lung nodule, solitary 793.11 R91.1   3. Dyspnea and respiratory abnormality 786.09 R06.00     R06.89   4. emphysema     Plan:      #Shortness of breath - due to emphysema but body deconditioning, weight and joint pain issus also making it worse  - your joint issues preclude you from rehab and cpst test -so in some ways we just have to accept some level of shortness of breath  #Emphysema lung with nocturnal desaturation  -  BREO 1 puff daily and continue spiriva 1 puff daily  - continue 1L O2 at night   #Lung nodule right 1cm with negative PET SCAn SEpt 2016 and unchanged Jan 2017 - repeat CT chest  End may/early-June 2017 - super D protocol   # Hoarseness of voice  -too bad it persists despite ENT Eval and increaseing your prilosec and despite BReo - if persists, refer GI for acid reflux evaluation  #Snoring and Xs day time sleepiness  -rper Dr Halford Chessman in our office - nurse to help you now 05/25/2015 with supply questions  #followup  = me or NP Tammy in May/June 2017 after CT chest   > 50% of this > 25 min visit spent in face to face counseling or coordination of care   Dr. Brand Males, M.D., [facility].C.P Pulmonary and Critical Care Medicine Staff Physician [facility] Pager: [phone], If no answer or between  15:00h - 7:00h: call [phone]  05/25/2015 4:05 PM

## 2015-06-21 ENCOUNTER — Telehealth: Payer: Self-pay | Admitting: Cardiology

## 2015-06-21 NOTE — Telephone Encounter (Signed)
Spoke w/ pt and requested that she send a manual transmission b/c her home monitor has not updated in at least 8 days.   

## 2015-06-26 ENCOUNTER — Other Ambulatory Visit: Payer: Self-pay | Admitting: Internal Medicine

## 2015-07-01 ENCOUNTER — Telehealth: Payer: Self-pay | Admitting: Cardiology

## 2015-07-01 NOTE — Telephone Encounter (Signed)
Spoke w/ pt and requested that she send a manual transmission b/c her home monitor has not updated in at least 8 days. She stated that she believes her phone cord is broken and as soon as she can get a new phone cord she will send a remote transmission. Pt wants to know if she can get a cellular adapter. I informed her that I would talk to a rep about getting her cellular adapter ordered and sent to her. Pt verbalized understanding.

## 2015-07-05 ENCOUNTER — Ambulatory Visit: Payer: Medicare Other | Admitting: Podiatry

## 2015-07-12 ENCOUNTER — Ambulatory Visit (INDEPENDENT_AMBULATORY_CARE_PROVIDER_SITE_OTHER): Payer: Medicare Other | Admitting: Internal Medicine

## 2015-07-12 ENCOUNTER — Encounter: Payer: Self-pay | Admitting: Internal Medicine

## 2015-07-12 ENCOUNTER — Other Ambulatory Visit: Payer: Self-pay

## 2015-07-12 VITALS — BP 124/78 | HR 73 | Ht 66.5 in | Wt 202.8 lb

## 2015-07-12 DIAGNOSIS — I480 Paroxysmal atrial fibrillation: Secondary | ICD-10-CM | POA: Diagnosis not present

## 2015-07-12 DIAGNOSIS — Z95810 Presence of automatic (implantable) cardiac defibrillator: Secondary | ICD-10-CM | POA: Diagnosis not present

## 2015-07-12 MED ORDER — DOFETILIDE 250 MCG PO CAPS: 250.0000 ug | ORAL_CAPSULE | Freq: Two times a day (BID) | ORAL | Status: DC

## 2015-07-12 NOTE — Patient Instructions (Signed)
Medication Instructions:  Your physician recommends that you continue on your current medications as directed. Please refer to the Current Medication list given to you today.   Labwork: None ordered   Testing/Procedures: None ordered   Follow-Up: Your physician wants you to follow-up in: 12 months with Dr Knox Saliva will receive a reminder letter in the mail two months in advance. If you don't receive a letter, please call our office to schedule the follow-up appointment.  Remote monitoring is used to monitor your ICD from home. This monitoring reduces the number of office visits required to check your device to one time per year. It allows [place] to keep an eye on the functioning of your device to ensure it is working properly. You are scheduled for a device check from home on 10/11/15. You may send your transmission at any time that day. If you have a wireless device, the transmission will be sent automatically. After your physician reviews your transmission, you will receive a postcard with your next transmission date.  Remote means from check from home     Any Other Special Instructions Will Be Listed Below (If Applicable).     If you need a refill on your cardiac medications before your next appointment, please call your pharmacy.

## 2015-07-12 NOTE — Progress Notes (Signed)
HPI Emily Abbott returns for ongoing ICD followup. She is a pleasant 75 yo woman with a h/o CAD, s/p MI, chronic systolic heart failure, s/p BiV ICD. She has been stable.  She has occaisional palpitations. She notes dyspnea with exertion. Mild peripheral edema which she treats with as needed diuretic therapy. No ICD shocks. Her blood pressure has not been well controlled.  She has a h/o PAF. No other complaints today.  Allergies  Allergen Reactions  . Sulfonamide Derivatives     Unknown     Current Outpatient Prescriptions  Medication Sig Dispense Refill  . albuterol (PROAIR HFA) 108 (90 BASE) MCG/ACT inhaler Use as directed as needed    . ALPRAZolam (XANAX) 0.5 MG tablet Take 0.5 mg by mouth 2 (two) times daily as needed.     Marland Kitchen amLODipine (NORVASC) 5 MG tablet Take 5 mg by mouth daily.    Marland Kitchen aspirin 325 MG tablet Take 325 mg by mouth daily.      Marland Kitchen atorvastatin (LIPITOR) 20 MG tablet Take 1 tablet by mouth daily.  1  . carvedilol (COREG) 6.25 MG tablet Take 6.25 mg by mouth 2 (two) times daily with a meal.    . diphenhydrAMINE (BENADRYL) 25 mg capsule Take 25 mg by mouth at bedtime.      . dofetilide (TIKOSYN) 250 MCG capsule TAKE 1 CAPSULE BY MOUTH TWICE DAILY 60 capsule 2  . Fluticasone Furoate-Vilanterol (BREO ELLIPTA) 100-25 MCG/INH AEPB Inhale 1 puff into the lungs daily. 60 each 5  . furosemide (LASIX) 20 MG tablet TAKE 2 TABLETS BY MOUTH EVERY DAY 180 tablet 0  . HYDROcodone-acetaminophen (VICODIN) 5-500 MG per tablet Take 1 tablet by mouth as directed.     . magnesium gluconate (MAGONATE) 500 MG tablet Take 500 mg by mouth daily. Reported on 07/12/2015    . metFORMIN (GLUCOPHAGE) 850 MG tablet Take 850 mg by mouth 2 (two) times daily with a meal.      . omeprazole (PRILOSEC) 40 MG capsule Take 40 mg by mouth daily.      Marland Kitchen PARoxetine (PAXIL) 30 MG tablet Take 30 mg by mouth daily.      Marland Kitchen tiotropium (SPIRIVA) 18 MCG inhalation capsule Place 1 capsule (18 mcg total) into inhaler  and inhale daily. 30 capsule 6  . warfarin (COUMADIN) 5 MG tablet Use as directed by Alta Corning, MD     . zolpidem (AMBIEN) 10 MG tablet Take 5 mg by mouth at bedtime as needed for sleep.     . ferrous sulfate 325 (65 FE) MG tablet Take 1 tablet by mouth daily. Reported on 07/12/2015  5  . glucosamine-chondroitin 500-400 MG tablet Take 1 tablet by mouth daily. Reported on 07/12/2015    . vitamin E 200 UNIT capsule Take 200 Units by mouth daily. Reported on 07/12/2015     No current facility-administered medications for this visit.     Past Medical History  Diagnosis Date  . Paroxysmal atrial fibrillation (HCC)   . CAD (coronary artery disease)   . HTN (hypertension)     essential  . Hyperlipidemia, mixed   . Diabetes mellitus     type II  . Mitral valve disorder   . GERD (gastroesophageal reflux disease)   . Pulmonary embolism ([place])   . Anemia   . COPD (chronic obstructive pulmonary disease) ([place])   . Osteoarthritis   . Anxiety   . Panic attack     ROS:   All systems  reviewed and negative except as noted in the HPI.   Past Surgical History  Procedure Laterality Date  . Btl    . Biopsy breast    . Carpal tennel  2003  . Cardiac valve surgery      mitral     Family History  Problem Relation Age of Onset  . Stroke Mother   . Anuerysm Father   . Diabetes Sister   . Cancer Brother   . Ulcers Sister      Social History   Social History  . Marital Status: Widowed    Spouse Name: N/A  . Number of Children: N/A  . Years of Education: N/A   Occupational History  . reitred    Social History Main Topics  . Smoking status: Former Smoker -- 1.00 packs/day for 50 years    Types: Cigarettes    Quit date: 02/19/2001  . Smokeless tobacco: Never Used  . Alcohol Use: No  . Drug Use: Not on file  . Sexual Activity: Not on file   Other Topics Concern  . Not on file   Social History Narrative     BP 124/78 mmHg  Pulse 73  Ht 5' 6.5" (1.689 m)  Wt 202 lb  12.8 oz (91.989 kg)  BMI 32.25 kg/m2 Physical Exam:  Well appearing 75 yo woman, NAD HEENT: Unremarkable Neck:  7 cm JVD, no thyromegally Back:  No CVA tenderness Lungs:  Clear with rales in the bases. HEART:  Regular rate rhythm, no murmurs, no rubs, no clicks Abd:  soft, positive bowel sounds, no organomegally, no rebound, no guarding Ext:  2 plus pulses, trace edema, no cyanosis, no clubbing Skin:  No rashes no nodules Neuro:  CN II through XII intact, motor grossly intact  EKG - nsr with BiV pacing  DEVICE  Normal device function.  See PaceArt for details.   Assess/Plan: 1. Chronic systolic heart failure - her symptoms remain class 2. She will continue her current meds. 2. PAF - she is maintaining NSR. No change in meds. 3. ICD - her St. Jude BiV ICD is working normally. Will recheck in several months.  Mikle Bosworth.D.

## 2015-07-12 NOTE — Progress Notes (Signed)
Patient attended ICD support group and interested in monthly ICM follow ups to check fluid levels.  Met patient in office and provided ICM enrollment and letter.  She agreed to monthly follow up.  Patient reported to [employer] rep the phone cord she has is broken.  Rep will order the wireless adapter and should arrive in the next few days.  Advised patient to send a remote when she receives the adapter and to call me.  Advise I will call her back to confirm the transmission was received.

## 2015-07-13 LAB — CUP PACEART INCLINIC DEVICE CHECK
Battery Remaining Longevity: 25.2
Brady Statistic RA Percent Paced: 26 %
HIGH POWER IMPEDANCE MEASURED VALUE: 61.875
Implantable Lead Implant Date: 20110127
Implantable Lead Implant Date: 20110127
Implantable Lead Location: 753860
Lead Channel Impedance Value: 962.5 Ohm
Lead Channel Pacing Threshold Amplitude: 0.625 V
Lead Channel Pacing Threshold Amplitude: 0.75 V
Lead Channel Pacing Threshold Amplitude: 0.75 V
Lead Channel Pacing Threshold Pulse Width: 0.5 ms
Lead Channel Pacing Threshold Pulse Width: 0.5 ms
Lead Channel Sensing Intrinsic Amplitude: 2.8 mV
Lead Channel Setting Pacing Amplitude: 2 V
Lead Channel Setting Pacing Pulse Width: 0.5 ms
Lead Channel Setting Sensing Sensitivity: 0.5 mV
MDC IDC LEAD IMPLANT DT: 20110127
MDC IDC LEAD LOCATION: 753858
MDC IDC LEAD LOCATION: 753859
MDC IDC LEAD MODEL: 7122
MDC IDC MSMT LEADCHNL RA IMPEDANCE VALUE: 375 Ohm
MDC IDC MSMT LEADCHNL RV IMPEDANCE VALUE: 462.5 Ohm
MDC IDC MSMT LEADCHNL RV PACING THRESHOLD PULSEWIDTH: 0.5 ms
MDC IDC MSMT LEADCHNL RV SENSING INTR AMPL: 11.9 mV
MDC IDC PG SERIAL: 752248
MDC IDC SESS DTM: 20170523192756
MDC IDC SET LEADCHNL LV PACING AMPLITUDE: 2 V
MDC IDC SET LEADCHNL LV PACING PULSEWIDTH: 0.5 ms
MDC IDC SET LEADCHNL RV PACING AMPLITUDE: 2 V
MDC IDC STAT BRADY RV PERCENT PACED: 95 %

## 2015-07-27 ENCOUNTER — Ambulatory Visit: Payer: Medicare Other | Admitting: Podiatry

## 2015-08-02 ENCOUNTER — Ambulatory Visit (INDEPENDENT_AMBULATORY_CARE_PROVIDER_SITE_OTHER)
Admission: RE | Admit: 2015-08-02 | Discharge: 2015-08-02 | Disposition: A | Discharge status: Home / Self Care | Payer: Medicare Other | Source: Ambulatory Visit | Attending: Internal Medicine | Admitting: Internal Medicine

## 2015-08-02 DIAGNOSIS — R911 Solitary pulmonary nodule: Secondary | ICD-10-CM | POA: Diagnosis not present

## 2015-08-12 ENCOUNTER — Other Ambulatory Visit: Payer: Self-pay

## 2015-08-12 ENCOUNTER — Telehealth: Payer: Self-pay

## 2015-08-12 ENCOUNTER — Ambulatory Visit (INDEPENDENT_AMBULATORY_CARE_PROVIDER_SITE_OTHER): Payer: Medicare Other

## 2015-08-12 ENCOUNTER — Telehealth: Payer: Self-pay | Admitting: Internal Medicine

## 2015-08-12 DIAGNOSIS — I5022 Chronic systolic (congestive) heart failure: Secondary | ICD-10-CM

## 2015-08-12 DIAGNOSIS — Z95810 Presence of automatic (implantable) cardiac defibrillator: Secondary | ICD-10-CM | POA: Diagnosis not present

## 2015-08-12 DIAGNOSIS — R911 Solitary pulmonary nodule: Secondary | ICD-10-CM

## 2015-08-12 MED ORDER — ALBUTEROL SULFATE HFA 108 (90 BASE) MCG/ACT IN AERS: 1.0000 | INHALATION_SPRAY | Freq: Four times a day (QID) | RESPIRATORY_TRACT | Status: DC | PRN

## 2015-08-12 NOTE — Telephone Encounter (Signed)
Spoke with patient.

## 2015-08-12 NOTE — Progress Notes (Signed)
EPIC Encounter for ICM Monitoring  Patient Name: Emily Abbott is a 75 y.o. female Date: 08/12/2015 Primary Care Physican: Vidal Schwalbe, MD Primary Cardiologist: Lovena Le Electrophysiologist: Lovena Le Dry Weight: unknown  Bi-V Pacing >99%      In the past month, have you:  1. Gained more than 2 pounds in a day or more than 5 pounds in a week? N/A  2. Had changes in your medications (with verification of current medications)? N/A  3. Had more shortness of breath than is usual for you? N/A  4. Limited your activity because of shortness of breath? N/A  5. Not been able to sleep because of shortness of breath? N/A  6. Had increased swelling in your feet, ankles, legs or stomach area? N/A  7. Had symptoms of dehydration (dizziness, dry mouth, increased thirst, decreased urine output) N/A  8. Had changes in sodium restriction? N/A  9. Been compliant with medication? N/A  ICM trend: 3 month view for 08/12/2015   ICM trend: 1 year view for 08/12/2015    Follow-up plan: ICM clinic phone appointment 09/15/2015.   1st ICM transmission.  Attempted call to patient and unable to reach.  Transmission reviewed.  FLUID LEVELS: [place] thoracic impedance decreased 07/31/2015 to 08/06/2015 suggesting fluid accumulation and returned to baseline 08/06/2015.     [name], RN, CCM 08/12/2015 3:01 PM

## 2015-08-12 NOTE — Telephone Encounter (Signed)
Remote ICM transmission received.  Attempted patient call and left message for return call.   

## 2015-08-12 NOTE — Telephone Encounter (Signed)
New message       1. Has your device fired? no  2. Is you device beeping? no  3. Are you experiencing draining or swelling at device site? no  4. Are you calling to see if we received your device transmission? Making sure the down load is coming in and everything is fine  5. Have you passed out? no

## 2015-08-12 NOTE — Progress Notes (Signed)
Patient returned call.  She stated she has some leg swelling but it does go down when she elevates her legs.  She has been on her feet more in the last 2 days.  She denied any weight gain or SOB.   Reviewed fluid symptoms to report and if she experiences any symptoms over the weekend to use local emergency number.  She reported missing one dose of Lasix in the last week.  Unsure of current weight.  Advised to limit salt intake to 2000 mg to help decrease leg swelling.  No recent BMP on file.  She stated Dr Dema Severin did labs about a month ago and said she in stage 3 renal disease and may need a referral to kidney MD.  Advised her to call back on 08/15/2015 if leg swelling does not improve.  She stated she would do so.

## 2015-08-24 ENCOUNTER — Ambulatory Visit: Payer: Medicare Other | Admitting: Internal Medicine

## 2015-08-25 ENCOUNTER — Ambulatory Visit: Payer: Medicare Other | Admitting: Podiatry

## 2015-09-15 ENCOUNTER — Ambulatory Visit (INDEPENDENT_AMBULATORY_CARE_PROVIDER_SITE_OTHER): Payer: Medicare Other

## 2015-09-15 DIAGNOSIS — Z95810 Presence of automatic (implantable) cardiac defibrillator: Secondary | ICD-10-CM | POA: Diagnosis not present

## 2015-09-15 DIAGNOSIS — I5022 Chronic systolic (congestive) heart failure: Secondary | ICD-10-CM

## 2015-09-16 ENCOUNTER — Telehealth: Payer: Self-pay

## 2015-09-16 NOTE — Progress Notes (Signed)
EPIC Encounter for ICM Monitoring  Patient Name: Emily Abbott is a 75 y.o. female Date: 09/16/2015 Primary Care Physican: Vidal Schwalbe, MD Primary Cardiologist: Lovena Le Electrophysiologist: Lovena Le Dry Weight:  unknown Bi-V Pacing:  >99%       Attempted patient call and unable to reach.  Transmission reviewed.   Thoracic impedance abnormal suggesting fluid accumulation since 09/12/2015.  LABS from Dr Orest Dikes office       06/20/2015 Creatinine 1.12, BUN 22, Potassium 4.5, Sodium 141, GFR 47.   ICM trend: 09/05/2015    Follow-up plan: ICM clinic phone appointment on 10/17/2015.  Copy of ICM check sent to device physician.   [name], RN 09/16/2015 1:05 PM

## 2015-09-16 NOTE — Telephone Encounter (Signed)
Call to Dr [facility] office and spoke with Estill Bamberg.  Requested fax of latest lab results and she stated she will fax now.    Received fax results from 06/20/2015. Creatinine 1.12, BUN 22, Potassium 4.5, Sodium 141, GFR 47.

## 2015-09-16 NOTE — Telephone Encounter (Signed)
Remote ICM transmission received.  Attempted patient call and left message for return call.   

## 2015-10-02 ENCOUNTER — Other Ambulatory Visit: Payer: Self-pay | Admitting: Internal Medicine

## 2015-10-17 ENCOUNTER — Telehealth: Payer: Self-pay

## 2015-10-17 ENCOUNTER — Ambulatory Visit (INDEPENDENT_AMBULATORY_CARE_PROVIDER_SITE_OTHER): Payer: Medicare Other | Admitting: *Deleted

## 2015-10-17 DIAGNOSIS — Z95810 Presence of automatic (implantable) cardiac defibrillator: Secondary | ICD-10-CM | POA: Diagnosis not present

## 2015-10-17 DIAGNOSIS — I5022 Chronic systolic (congestive) heart failure: Secondary | ICD-10-CM

## 2015-10-17 NOTE — Progress Notes (Signed)
EPIC Encounter for ICM Monitoring  Patient Name: Emily Abbott is a 75 y.o. female Date: 10/17/2015 Primary Care Physican: Vidal Schwalbe, MD Primary Cardiologist: Lovena Le Electrophysiologist: Lovena Le Dry Weight: unknown Bi-V Pacing:  >99%       Attempted ICM call and unable to reach.  Transmission reviewed.   Thoracic impedance abnormal suggesting fluid accumulation 10/10/2015 to 10/14/2015 and returned to normal 10/14/2015.  LABS from Dr Orest Dikes office       06/20/2015 Creatinine 1.12, BUN 22, Potassium 4.5, Sodium 141, GFR 47.  Follow-up plan: ICM clinic phone appointment on 11/17/2015.  Copy of ICM check sent to device physician.   ICM trend: 10/17/2015       [name], RN 10/17/2015 10:09 AM

## 2015-10-17 NOTE — Telephone Encounter (Signed)
Remote ICM transmission received.  Attempted patient call and left message for return call.   

## 2015-10-21 ENCOUNTER — Encounter: Payer: Self-pay | Admitting: Cardiology

## 2015-10-21 NOTE — Progress Notes (Signed)
Patient returned call and she stated she is feeling fine.  Reviewed transmission.  She reported she has some swelling of feet and legs sometimes but right now there is no swelling.  Advised to call if she has fluid symptoms.  No changes today.  Next ICM remote transmission scheduled for 11/23/2015.

## 2015-10-27 ENCOUNTER — Telehealth: Payer: Self-pay | Admitting: Cardiology

## 2015-10-27 NOTE — Telephone Encounter (Signed)
LMOVM informing pt that we received her reservation for 2 for ICD support group

## 2015-11-17 ENCOUNTER — Ambulatory Visit (INDEPENDENT_AMBULATORY_CARE_PROVIDER_SITE_OTHER): Payer: Medicare Other

## 2015-11-17 DIAGNOSIS — Z95810 Presence of automatic (implantable) cardiac defibrillator: Secondary | ICD-10-CM | POA: Diagnosis not present

## 2015-11-17 DIAGNOSIS — I5022 Chronic systolic (congestive) heart failure: Secondary | ICD-10-CM

## 2015-11-17 NOTE — Progress Notes (Signed)
EPIC Encounter for ICM Monitoring  Patient Name: Emily Abbott is a 75 y.o. female Date: 11/17/2015 Primary Care Physican: Vidal Schwalbe, MD Primary Cardiologist: Lovena Le Electrophysiologist: Lovena Le Dry Weight: 200 lb  Bi-V Pacing:  >99%       Heart Failure questions reviewed, pt asymptomatic today.  She can tell when she is retaining fluid.  She asked what she should do if the fluid does not go away.  Advised to call ICM number or main office number for Dr Lovena Le so symptoms can be caught early.    Thoracic impedance normal   Recommendations: No changes.  Low sodium diet education provided.    Follow-up plan: ICM clinic phone appointment on 12/20/2015.  Copy of ICM check sent to device physician.   ICM trend: 11/17/2015      [name], RN 11/17/2015 10:41 AM

## 2015-12-20 ENCOUNTER — Ambulatory Visit (INDEPENDENT_AMBULATORY_CARE_PROVIDER_SITE_OTHER): Payer: Medicare Other

## 2015-12-20 DIAGNOSIS — I5022 Chronic systolic (congestive) heart failure: Secondary | ICD-10-CM | POA: Diagnosis not present

## 2015-12-20 DIAGNOSIS — Z95810 Presence of automatic (implantable) cardiac defibrillator: Secondary | ICD-10-CM | POA: Diagnosis not present

## 2015-12-20 NOTE — Progress Notes (Signed)
EPIC Encounter for ICM Monitoring  Patient Name: Emily Abbott is a 75 y.o. female Date: 12/20/2015 Primary Care Physican: Vidal Schwalbe, MD Primary Cardiologist:Taylor Electrophysiologist: Lovena Le Dry Weight:    204 lbs approximately Bi-V Pacing:  >99%              Heart Failure questions reviewed, pt asymptomatic   Thoracic impedance normal   LABS from Dr Orest Dikes office 06/20/2015 Creatinine 1.12, BUN 22, Potassium 4.5, Sodium 141, GFR 47.  Recommendations: No changes.  Advised to limit salt intake to 2000 mg daily.  Encouraged to call for fluid symptoms.    Follow-up plan: ICM clinic phone appointment on 01/24/2016.  Copy of ICM check sent to device physician.   ICM trend: 12/20/2015       Emily Billings, RN 12/20/2015 3:37 PM

## 2015-12-26 ENCOUNTER — Other Ambulatory Visit: Payer: Self-pay | Admitting: Internal Medicine

## 2016-01-24 ENCOUNTER — Telehealth: Payer: Self-pay

## 2016-01-24 ENCOUNTER — Ambulatory Visit (INDEPENDENT_AMBULATORY_CARE_PROVIDER_SITE_OTHER): Payer: Medicare Other

## 2016-01-24 DIAGNOSIS — Z95810 Presence of automatic (implantable) cardiac defibrillator: Secondary | ICD-10-CM | POA: Diagnosis not present

## 2016-01-24 DIAGNOSIS — I5022 Chronic systolic (congestive) heart failure: Secondary | ICD-10-CM | POA: Diagnosis not present

## 2016-01-24 NOTE — Progress Notes (Signed)
Patient left voice mail to return call.  Attempted call back and no answer

## 2016-01-24 NOTE — Progress Notes (Signed)
EPIC Encounter for ICM Monitoring  Patient Name: Emily Abbott is a 75 y.o. female Date: 01/24/2016 Primary Care Physican: Vidal Schwalbe, MD Primary Cardiologist: Lovena Le Electrophysiologist: Lovena Le Dry Weight:  unknown Bi-V Pacing:  >99%       Attempted ICM call and unable to reach. Left message to return call.  Transmission reviewed.   Thoracic impedance abnormal suggesting fluid accumulation since 01/21/2016.  LABS from Dr Orest Dikes office 06/20/2015 Creatinine 1.12, BUN 22, Potassium 4.5, Sodium 141, GFR 47.  Recommendations:  NONE- Unable to reach   Follow-up plan: ICM clinic phone appointment on 02/02/2016.  Copy of ICM check sent to device physician.   ICM trend: 01/24/2016       [name], RN 01/24/2016 8:48 AM

## 2016-01-24 NOTE — Telephone Encounter (Signed)
Remote ICM transmission received.  Attempted patient call and left message to return call.   

## 2016-02-02 ENCOUNTER — Ambulatory Visit (INDEPENDENT_AMBULATORY_CARE_PROVIDER_SITE_OTHER): Payer: Medicare Other

## 2016-02-02 DIAGNOSIS — I5022 Chronic systolic (congestive) heart failure: Secondary | ICD-10-CM

## 2016-02-02 DIAGNOSIS — Z95810 Presence of automatic (implantable) cardiac defibrillator: Secondary | ICD-10-CM

## 2016-02-02 NOTE — Progress Notes (Signed)
EPIC Encounter for ICM Monitoring  Patient Name: Emily Abbott is a 75 y.o. female Date: 02/02/2016 Primary Care Physican: Vidal Schwalbe, MD Primary Cardiologist: Lovena Le Electrophysiologist: Lovena Le Dry Weight:     unknown Bi-V Pacing:  >99%                                                        Heart Failure questions reviewed, pt asymptomatic but did have some difficulty breathing at night during 12/2 and 12/8.  No current fluid symptoms.   Thoracic impedance returned to normal 01/27/2016.  Recommendations:  No changes.  Reinforced to limit low salt food choices to 2000 mg day and limiting fluid intake to < 2 liters per day. Encouraged to call for fluid symptoms.    Follow-up plan: ICM clinic phone appointment on 02/28/2016.  Copy of ICM check sent to primary cardiologist and device physician.   ICM trend: 02/02/2016       [name], RN 02/02/2016 8:45 AM

## 2016-02-22 DIAGNOSIS — Z7901 Long term (current) use of anticoagulants: Secondary | ICD-10-CM | POA: Diagnosis not present

## 2016-02-28 ENCOUNTER — Ambulatory Visit (INDEPENDENT_AMBULATORY_CARE_PROVIDER_SITE_OTHER): Payer: Medicare HMO

## 2016-02-28 ENCOUNTER — Telehealth: Payer: Self-pay

## 2016-02-28 DIAGNOSIS — I480 Paroxysmal atrial fibrillation: Secondary | ICD-10-CM | POA: Diagnosis not present

## 2016-02-28 DIAGNOSIS — Z95810 Presence of automatic (implantable) cardiac defibrillator: Secondary | ICD-10-CM | POA: Diagnosis not present

## 2016-02-28 DIAGNOSIS — I130 Hypertensive heart and chronic kidney disease with heart failure and stage 1 through stage 4 chronic kidney disease, or unspecified chronic kidney disease: Secondary | ICD-10-CM | POA: Diagnosis not present

## 2016-02-28 DIAGNOSIS — J329 Chronic sinusitis, unspecified: Secondary | ICD-10-CM | POA: Diagnosis not present

## 2016-02-28 DIAGNOSIS — J101 Influenza due to other identified influenza virus with other respiratory manifestations: Secondary | ICD-10-CM | POA: Diagnosis not present

## 2016-02-28 DIAGNOSIS — I5022 Chronic systolic (congestive) heart failure: Secondary | ICD-10-CM

## 2016-02-28 DIAGNOSIS — N183 Chronic kidney disease, stage 3 (moderate): Secondary | ICD-10-CM | POA: Diagnosis not present

## 2016-02-28 NOTE — Telephone Encounter (Signed)
Remote ICM transmission received.  Attempted patient call and left message to return call.   

## 2016-02-28 NOTE — Progress Notes (Signed)
EPIC Encounter for ICM Monitoring  Patient Name: Emily Abbott is a 76 y.o. female Date: 02/28/2016 Primary Care Physican: Vidal Schwalbe, MD Primary Cardiologist:Taylor Electrophysiologist: Druscilla Brownie Weight:unknown Bi-V Pacing: >99%                                          Attempted ICM call and unable to reach.  Left message to return call.  Transmission reviewed.   Thoracic impedance normal   Recommendations: Provided ICM direct number.     Follow-up plan: ICM clinic phone appointment on 03/30/2016.  Copy of ICM check sent to device physician.   3 month ICM trend : 02/28/2016   1 Year ICM trend:      [name], RN 02/28/2016 9:21 AM

## 2016-03-01 NOTE — Progress Notes (Signed)
Patient returned call.  She stated she is sick with the flu and sinus infection.  She is on antibiotics and her stomach has been upset.  She stated she has not been able to drink much.  Transmission reviewed and no fluid accumulation.  Advised to call if she develops fluid symptoms.  No changes today. Next ICM remote scheduled for 03/30/2016.

## 2016-03-26 DIAGNOSIS — Z7901 Long term (current) use of anticoagulants: Secondary | ICD-10-CM | POA: Diagnosis not present

## 2016-03-27 DIAGNOSIS — J438 Other emphysema: Secondary | ICD-10-CM | POA: Diagnosis not present

## 2016-03-30 ENCOUNTER — Ambulatory Visit (INDEPENDENT_AMBULATORY_CARE_PROVIDER_SITE_OTHER): Payer: Medicare HMO

## 2016-03-30 DIAGNOSIS — Z95810 Presence of automatic (implantable) cardiac defibrillator: Secondary | ICD-10-CM

## 2016-03-30 DIAGNOSIS — I5022 Chronic systolic (congestive) heart failure: Secondary | ICD-10-CM | POA: Diagnosis not present

## 2016-03-30 NOTE — Progress Notes (Signed)
EPIC Encounter for ICM Monitoring  Patient Name: Emily Abbott is a 76 y.o. female Date: 03/30/2016 Primary Care Physican: Vidal Schwalbe, MD Primary Cardiologist:Taylor Electrophysiologist: Lovena Le Dry Weight:190 lbs Bi-V Pacing: >99%      Heart Failure questions reviewed, pt asymptomatic   Thoracic impedance abnormal suggesting fluid accumulation since 03/25/2016.  LABS from Dr Orest Dikes office 06/20/2015 Creatinine 1.12, BUN 22, Potassium 4.5, Sodium 141, GFR 47.  Prescribed dosage she takes Furosemide 20 mg 2 tablets daily  Recommendations:  Increased Furosemide 20 mg 2 tablets (40 mg total) bid x 2 days and then return to prescribed dosage of 2 tablets (40 mg total) daily  Follow-up plan: ICM clinic phone appointment on 04/05/2016 to recheck fluid levels.  Copy of ICM check sent to device physician.   3 month ICM trend: 03/30/2016   1 Year ICM trend:      [name], RN 03/30/2016 7:38 AM

## 2016-04-04 DIAGNOSIS — G4733 Obstructive sleep apnea (adult) (pediatric): Secondary | ICD-10-CM | POA: Diagnosis not present

## 2016-04-05 ENCOUNTER — Telehealth: Payer: Self-pay | Admitting: Cardiology

## 2016-04-05 ENCOUNTER — Ambulatory Visit (INDEPENDENT_AMBULATORY_CARE_PROVIDER_SITE_OTHER): Payer: Medicare HMO

## 2016-04-05 ENCOUNTER — Telehealth: Payer: Self-pay

## 2016-04-05 DIAGNOSIS — Z95810 Presence of automatic (implantable) cardiac defibrillator: Secondary | ICD-10-CM

## 2016-04-05 DIAGNOSIS — I5022 Chronic systolic (congestive) heart failure: Secondary | ICD-10-CM

## 2016-04-05 NOTE — Telephone Encounter (Signed)
Remote ICM transmission received.  Attempted patient call and left message to return call.   

## 2016-04-05 NOTE — Progress Notes (Signed)
EPIC Encounter for ICM Monitoring  Patient Name: Emily Abbott is a 76 y.o. female Date: 04/05/2016 Primary Care Physican: Vidal Schwalbe, MD Primary Cardiologist:Taylor Electrophysiologist: Lovena Le Dry Weight: unknown Bi-V Pacing: >99%      Attempted call to patient and unable to reach.  Left message to return call.  Transmission reviewed.    Thoracic impedance returned to normal after 2 days of increased Furosemide.   Current prescribed dose of Furosemide 20 mg 2 tablets (40 mg total) daily  LABS from Dr Orest Dikes office 06/20/2015 Creatinine 1.12, BUN 22, Potassium 4.5, Sodium 141, GFR 47.  Recommendations: NONE - Unable to reach patient   Follow-up plan: ICM clinic phone appointment on 05/04/2016.  Copy of ICM check sent to device physician.   3 month ICM trend: 04/05/2016   1 Year ICM trend:      [name], RN 04/05/2016 4:18 PM

## 2016-04-05 NOTE — Telephone Encounter (Signed)
Spoke with pt and reminded pt of remote transmission that is due today. Pt verbalized understanding.   

## 2016-04-06 NOTE — Progress Notes (Signed)
Patient returned call.  She stated she is doing fine and denied any fluid symptoms.  Transmission reviewed.  No changes today but reminder her to limit salt intake.  Confirmed she is taking prescribed dosage of Furosemide.

## 2016-04-24 DIAGNOSIS — J438 Other emphysema: Secondary | ICD-10-CM | POA: Diagnosis not present

## 2016-04-27 DIAGNOSIS — R69 Illness, unspecified: Secondary | ICD-10-CM | POA: Diagnosis not present

## 2016-04-30 ENCOUNTER — Inpatient Hospital Stay: Admission: RE | Admit: 2016-04-30 | Payer: Medicare HMO | Source: Ambulatory Visit

## 2016-05-04 ENCOUNTER — Telehealth: Payer: Self-pay | Admitting: Cardiology

## 2016-05-04 NOTE — Telephone Encounter (Signed)
Spoke with pt and reminded pt of remote transmission that is due today. Pt verbalized understanding.   

## 2016-05-07 ENCOUNTER — Telehealth: Payer: Self-pay | Admitting: *Deleted

## 2016-05-07 DIAGNOSIS — I480 Paroxysmal atrial fibrillation: Secondary | ICD-10-CM

## 2016-05-07 DIAGNOSIS — Z7901 Long term (current) use of anticoagulants: Secondary | ICD-10-CM | POA: Diagnosis not present

## 2016-05-07 DIAGNOSIS — Z1231 Encounter for screening mammogram for malignant neoplasm of breast: Secondary | ICD-10-CM | POA: Diagnosis not present

## 2016-05-07 MED ORDER — DOFETILIDE 250 MCG PO CAPS: 250.0000 ug | ORAL_CAPSULE | Freq: Two times a day (BID) | ORAL | 3 refills | Status: DC

## 2016-05-07 NOTE — Telephone Encounter (Signed)
Received message from CT that pt told them she needed refill of Tikosyn sent to [facility] on [address].  Will send in.

## 2016-05-08 ENCOUNTER — Encounter (INDEPENDENT_AMBULATORY_CARE_PROVIDER_SITE_OTHER): Payer: Self-pay

## 2016-05-08 ENCOUNTER — Telehealth: Payer: Self-pay | Admitting: Internal Medicine

## 2016-05-08 ENCOUNTER — Ambulatory Visit (INDEPENDENT_AMBULATORY_CARE_PROVIDER_SITE_OTHER)
Admission: RE | Admit: 2016-05-08 | Discharge: 2016-05-08 | Disposition: A | Discharge status: Home / Self Care | Payer: Medicare HMO | Source: Ambulatory Visit | Attending: Internal Medicine | Admitting: Internal Medicine

## 2016-05-08 DIAGNOSIS — R911 Solitary pulmonary nodule: Secondary | ICD-10-CM

## 2016-05-08 DIAGNOSIS — R918 Other nonspecific abnormal finding of lung field: Secondary | ICD-10-CM | POA: Diagnosis not present

## 2016-05-08 NOTE — Progress Notes (Signed)
ICM remote transmission rescheduled from 04/30/2016 to 05/24/2016.

## 2016-05-08 NOTE — Telephone Encounter (Signed)
No change in nodule since 2016 September  Plan  next ct chest wo contrast sept 2018 and fu with me then Can come sooner if needed or interested  Thanks  Dr. Brand Males, M.D., [facility].C.P Pulmonary and Critical Care Medicine Staff Physician [place] [facility] Pager: [phone], If no answer or between  15:00h - 7:00h: call [phone]  05/08/2016 5:00 PM      Ct Super D Chest Wo Contrast  Result Date: 05/08/2016 CLINICAL DATA:  Followup indeterminate pulmonary nodules. EXAM: CT CHEST WITHOUT CONTRAST TECHNIQUE: Multidetector CT imaging of the chest was performed using thin slice collimation for electromagnetic bronchoscopy planning purposes, without intravenous contrast. COMPARISON:  08/02/2015 and 11/15/2014 FINDINGS: Cardiovascular: No acute findings. Transvenous pacemaker and prosthetic mitral valve. Aortic and coronary artery atherosclerosis. Mediastinum/Nodes: No masses or pathologically enlarged lymph nodes identified on this unenhanced exam. Lungs/Pleura: 10 x 8 mm pulmonary nodule in the anterior inferior right upper lobe on image 77/30 is stable compared with Previous studies. Multiple other widely scattered tiny less than 5 mm pulmonary nodules are all stable. These are consistent with benign etiology. Several tiny calcified granulomas also noted in both lungs. No new or enlarging pulmonary nodules or masses are identified. No evidence of pulmonary infiltrate or pleural effusion. Upper Abdomen: Stable 2 cm low-attenuation right adrenal mass, consistent with benign adenoma. Musculoskeletal:  No suspicious bone lesions. IMPRESSION: Stable bilateral pulmonary nodules largest in right upper lobe measuring 10 mm, consistent with benign etiology. Stable benign right adrenal adenoma. Aortic and coronary artery atherosclerosis. Electronically Signed   By: Earle Gell M.D.   On: 05/08/2016 14:56

## 2016-05-09 ENCOUNTER — Telehealth: Payer: Self-pay | Admitting: Cardiology

## 2016-05-09 NOTE — Telephone Encounter (Signed)
lmtcb for pt.  

## 2016-05-09 NOTE — Telephone Encounter (Signed)
Spoke w/ pt and requested that she send a manual transmission b/c her home monitor has not updated in at least 7 days.   

## 2016-05-10 NOTE — Telephone Encounter (Signed)
Spoke with pt, aware of results/recs.  Ordered ct and placed recall for rov.  Nothing further needed.

## 2016-05-17 ENCOUNTER — Telehealth: Payer: Self-pay | Admitting: Cardiology

## 2016-05-17 NOTE — Telephone Encounter (Signed)
Spoke w/ pt and requested that sher send a manual transmission b/c his home monitor has not updated in at least 7 days.

## 2016-05-24 ENCOUNTER — Ambulatory Visit (INDEPENDENT_AMBULATORY_CARE_PROVIDER_SITE_OTHER): Payer: Medicare HMO | Admitting: *Deleted

## 2016-05-24 ENCOUNTER — Telehealth: Payer: Self-pay | Admitting: Cardiology

## 2016-05-24 DIAGNOSIS — I5022 Chronic systolic (congestive) heart failure: Secondary | ICD-10-CM | POA: Diagnosis not present

## 2016-05-24 DIAGNOSIS — I428 Other cardiomyopathies: Secondary | ICD-10-CM

## 2016-05-24 DIAGNOSIS — Z95810 Presence of automatic (implantable) cardiac defibrillator: Secondary | ICD-10-CM | POA: Diagnosis not present

## 2016-05-24 LAB — CUP PACEART REMOTE DEVICE CHECK
Battery Remaining Percentage: 18 %
Battery Voltage: 2.75 V
Brady Statistic AP VP Percent: 33 %
Brady Statistic AS VP Percent: 66 %
Brady Statistic RA Percent Paced: 31 %
HighPow Impedance: 70 Ohm
HighPow Impedance: 70 Ohm
Implantable Lead Implant Date: 20110127
Implantable Lead Implant Date: 20110127
Implantable Lead Implant Date: 20110127
Implantable Lead Location: 753858
Implantable Lead Location: 753859
Implantable Lead Location: 753860
Implantable Lead Model: 7122
Lead Channel Impedance Value: 390 Ohm
Lead Channel Impedance Value: 490 Ohm
Lead Channel Impedance Value: 980 Ohm
Lead Channel Pacing Threshold Amplitude: 0.625 V
Lead Channel Pacing Threshold Amplitude: 1.125 V
Lead Channel Pacing Threshold Pulse Width: 0.5 ms
Lead Channel Pacing Threshold Pulse Width: 0.5 ms
Lead Channel Sensing Intrinsic Amplitude: 12 mV
Lead Channel Setting Pacing Amplitude: 2 V
Lead Channel Setting Pacing Pulse Width: 0.5 ms
Lead Channel Setting Pacing Pulse Width: 0.5 ms
MDC IDC MSMT BATTERY REMAINING LONGEVITY: 18 mo
MDC IDC MSMT LEADCHNL RA PACING THRESHOLD AMPLITUDE: 0.75 V
MDC IDC MSMT LEADCHNL RA PACING THRESHOLD PULSEWIDTH: 0.5 ms
MDC IDC MSMT LEADCHNL RA SENSING INTR AMPL: 2.2 mV
MDC IDC PG IMPLANT DT: 20110127
MDC IDC PG SERIAL: 752248
MDC IDC SESS DTM: 20180405184100
MDC IDC SET LEADCHNL LV PACING AMPLITUDE: 2.125
MDC IDC SET LEADCHNL RV PACING AMPLITUDE: 2 V
MDC IDC SET LEADCHNL RV SENSING SENSITIVITY: 0.5 mV
MDC IDC STAT BRADY AP VS PERCENT: 1 %
MDC IDC STAT BRADY AS VS PERCENT: 1 %

## 2016-05-24 NOTE — Progress Notes (Signed)
EPIC Encounter for ICM Monitoring  Patient Name: Emily Abbott is a 76 y.o. female Date: 05/24/2016 Primary Care Physican: Vidal Schwalbe, MD Primary Cardiologist:Taylor Electrophysiologist: Lovena Le Dry Weight: unknown Bi-V Pacing: >99%      Heart Failure questions reviewed, pt asymptomatic.   Thoracic impedance normal but was abnormal suggesting fluid accumulation from 05/21/2016 to 05/23/2016.  Current prescribed dose of Furosemide 20 mg 2 tablets (40 mg total) daily  LABS from Dr Orest Dikes office 06/20/2015 Creatinine 1.12, BUN 22, Potassium 4.5, Sodium 141, GFR 47.  Recommendations: No changes. Encouraged to call for fluid symptoms.  Follow-up plan: ICM clinic phone appointment on 06/26/2016.  Office appointment scheduled on 08/20/2016 with Dr Lovena Le.  Copy of ICM check sent to device physician.   3 month ICM trend: 05/24/2016   1 Year ICM trend:      [name], RN 05/24/2016 3:40 PM

## 2016-05-24 NOTE — Progress Notes (Signed)
Remote ICD transmission.   

## 2016-05-24 NOTE — Telephone Encounter (Signed)
Spoke with pt and reminded pt of remote transmission that is due today. Pt verbalized understanding.   

## 2016-05-25 ENCOUNTER — Encounter: Payer: Self-pay | Admitting: Cardiology

## 2016-05-25 DIAGNOSIS — J438 Other emphysema: Secondary | ICD-10-CM | POA: Diagnosis not present

## 2016-05-30 ENCOUNTER — Ambulatory Visit
Admission: RE | Admit: 2016-05-30 | Discharge: 2016-05-30 | Disposition: A | Discharge status: Home / Self Care | Payer: Medicare HMO | Source: Ambulatory Visit | Attending: Family Medicine | Admitting: Family Medicine

## 2016-05-30 ENCOUNTER — Other Ambulatory Visit: Payer: Self-pay | Admitting: Family Medicine

## 2016-05-30 DIAGNOSIS — S0990XA Unspecified injury of head, initial encounter: Secondary | ICD-10-CM

## 2016-05-30 DIAGNOSIS — M7989 Other specified soft tissue disorders: Secondary | ICD-10-CM | POA: Diagnosis not present

## 2016-05-30 DIAGNOSIS — M25532 Pain in left wrist: Secondary | ICD-10-CM

## 2016-06-04 ENCOUNTER — Ambulatory Visit
Admission: RE | Admit: 2016-06-04 | Discharge: 2016-06-04 | Disposition: A | Discharge status: Home / Self Care | Payer: Medicare HMO | Source: Ambulatory Visit | Attending: Family Medicine | Admitting: Family Medicine

## 2016-06-04 DIAGNOSIS — S0990XA Unspecified injury of head, initial encounter: Secondary | ICD-10-CM | POA: Diagnosis not present

## 2016-06-06 DIAGNOSIS — Z7901 Long term (current) use of anticoagulants: Secondary | ICD-10-CM | POA: Diagnosis not present

## 2016-06-08 ENCOUNTER — Telehealth: Payer: Self-pay | Admitting: Cardiology

## 2016-06-08 NOTE — Telephone Encounter (Signed)
Spoke w/ pt and requested that she send a manual transmission b/c her home monitor has not updated in at least 7 days.   

## 2016-06-21 DIAGNOSIS — I130 Hypertensive heart and chronic kidney disease with heart failure and stage 1 through stage 4 chronic kidney disease, or unspecified chronic kidney disease: Secondary | ICD-10-CM | POA: Diagnosis not present

## 2016-06-21 DIAGNOSIS — E559 Vitamin D deficiency, unspecified: Secondary | ICD-10-CM | POA: Diagnosis not present

## 2016-06-21 DIAGNOSIS — I429 Cardiomyopathy, unspecified: Secondary | ICD-10-CM | POA: Diagnosis not present

## 2016-06-21 DIAGNOSIS — E785 Hyperlipidemia, unspecified: Secondary | ICD-10-CM | POA: Diagnosis not present

## 2016-06-21 DIAGNOSIS — D509 Iron deficiency anemia, unspecified: Secondary | ICD-10-CM | POA: Diagnosis not present

## 2016-06-21 DIAGNOSIS — G894 Chronic pain syndrome: Secondary | ICD-10-CM | POA: Diagnosis not present

## 2016-06-21 DIAGNOSIS — N183 Chronic kidney disease, stage 3 (moderate): Secondary | ICD-10-CM | POA: Diagnosis not present

## 2016-06-21 DIAGNOSIS — E1121 Type 2 diabetes mellitus with diabetic nephropathy: Secondary | ICD-10-CM | POA: Diagnosis not present

## 2016-06-21 DIAGNOSIS — E113299 Type 2 diabetes mellitus with mild nonproliferative diabetic retinopathy without macular edema, unspecified eye: Secondary | ICD-10-CM | POA: Diagnosis not present

## 2016-06-21 DIAGNOSIS — R490 Dysphonia: Secondary | ICD-10-CM | POA: Diagnosis not present

## 2016-06-24 DIAGNOSIS — J438 Other emphysema: Secondary | ICD-10-CM | POA: Diagnosis not present

## 2016-06-26 ENCOUNTER — Telehealth: Payer: Self-pay | Admitting: Cardiology

## 2016-06-26 ENCOUNTER — Ambulatory Visit (INDEPENDENT_AMBULATORY_CARE_PROVIDER_SITE_OTHER): Payer: Medicare HMO

## 2016-06-26 DIAGNOSIS — I5022 Chronic systolic (congestive) heart failure: Secondary | ICD-10-CM | POA: Diagnosis not present

## 2016-06-26 DIAGNOSIS — Z95810 Presence of automatic (implantable) cardiac defibrillator: Secondary | ICD-10-CM | POA: Diagnosis not present

## 2016-06-26 NOTE — Telephone Encounter (Signed)
Spoke with pt and reminded pt of remote transmission that is due today. Pt verbalized understanding.   

## 2016-06-28 NOTE — Progress Notes (Signed)
EPIC Encounter for ICM Monitoring  Patient Name: INGRID SHIFRIN is a 76 y.o. female Date: 06/28/2016 Primary Care Physican: Harlan Stains, MD Primary Cardiologist:Taylor Electrophysiologist: Lovena Le Dry Weight: unknown Bi-V Pacing: >99%         Heart Failure questions reviewed, pt symptomatic with cough at times.   Thoracic impedance normal but was abnormal suggesting fluid accumulation during month of April and from 5/4 until today.  Current prescribed dose of Furosemide 20 mg 2 tablets (40 mg total) daily  LABS from Dr Orest Dikes office 06/20/2015 Creatinine 1.12, BUN 22, Potassium 4.5, Sodium 141, GFR 47.  Recommendations: No changes. Advised to limit salt intake to 2000 mg/day and fluid intake to < 2 liters/day.  Encouraged to call for fluid symptoms or use local ER for any urgent symptoms.  Follow-up plan: ICM clinic phone appointment on 07/31/2016.  Office appointment scheduled on 08/20/2016 with Dr Lovena Le.  Copy of ICM check sent to device physician.   3 month ICM trend: 06/28/2016   1 Year ICM trend:      [name], RN 06/28/2016 1:19 PM

## 2016-07-09 DIAGNOSIS — R69 Illness, unspecified: Secondary | ICD-10-CM | POA: Diagnosis not present

## 2016-07-10 ENCOUNTER — Telehealth: Payer: Self-pay | Admitting: Internal Medicine

## 2016-07-10 DIAGNOSIS — Z7901 Long term (current) use of anticoagulants: Secondary | ICD-10-CM | POA: Diagnosis not present

## 2016-07-10 NOTE — Telephone Encounter (Signed)
New Message  Pt call requesting to speak with Margarita Grizzle. Pt will discuss when called back.

## 2016-07-10 NOTE — Telephone Encounter (Signed)
Returned call and left message stating was call her back.  Provided ICM direct phone number

## 2016-07-11 ENCOUNTER — Other Ambulatory Visit: Payer: Self-pay | Admitting: Internal Medicine

## 2016-07-17 ENCOUNTER — Telehealth: Payer: Self-pay | Admitting: Cardiology

## 2016-07-17 NOTE — Telephone Encounter (Signed)
Spoke w/ pt and requested that she send a manual transmission b/c her home monitor has not updated in at least 7 days.   

## 2016-07-25 DIAGNOSIS — J438 Other emphysema: Secondary | ICD-10-CM | POA: Diagnosis not present

## 2016-07-27 DIAGNOSIS — H04123 Dry eye syndrome of bilateral lacrimal glands: Secondary | ICD-10-CM | POA: Diagnosis not present

## 2016-07-27 DIAGNOSIS — E113291 Type 2 diabetes mellitus with mild nonproliferative diabetic retinopathy without macular edema, right eye: Secondary | ICD-10-CM | POA: Diagnosis not present

## 2016-07-27 DIAGNOSIS — Z961 Presence of intraocular lens: Secondary | ICD-10-CM | POA: Diagnosis not present

## 2016-07-30 ENCOUNTER — Other Ambulatory Visit: Payer: Self-pay | Admitting: Nephrology

## 2016-07-30 ENCOUNTER — Telehealth: Payer: Self-pay | Admitting: Cardiology

## 2016-07-30 DIAGNOSIS — D649 Anemia, unspecified: Secondary | ICD-10-CM | POA: Diagnosis not present

## 2016-07-30 DIAGNOSIS — N183 Chronic kidney disease, stage 3 unspecified: Secondary | ICD-10-CM

## 2016-07-30 DIAGNOSIS — I480 Paroxysmal atrial fibrillation: Secondary | ICD-10-CM | POA: Diagnosis not present

## 2016-07-30 DIAGNOSIS — Z6831 Body mass index (BMI) 31.0-31.9, adult: Secondary | ICD-10-CM | POA: Diagnosis not present

## 2016-07-30 DIAGNOSIS — K219 Gastro-esophageal reflux disease without esophagitis: Secondary | ICD-10-CM | POA: Diagnosis not present

## 2016-07-30 DIAGNOSIS — E1129 Type 2 diabetes mellitus with other diabetic kidney complication: Secondary | ICD-10-CM | POA: Diagnosis not present

## 2016-07-30 NOTE — Telephone Encounter (Signed)
LMOVM requesting that pt send manual transmission b/c home monitor has not updated in at least 7 days.    

## 2016-07-31 ENCOUNTER — Telehealth: Payer: Self-pay | Admitting: Cardiology

## 2016-07-31 NOTE — Telephone Encounter (Signed)
LMOVM reminding pt to send remote transmission.   

## 2016-08-02 ENCOUNTER — Ambulatory Visit
Admission: RE | Admit: 2016-08-02 | Discharge: 2016-08-02 | Disposition: A | Discharge status: Home / Self Care | Payer: Medicare HMO | Source: Ambulatory Visit | Attending: Nephrology | Admitting: Nephrology

## 2016-08-02 DIAGNOSIS — N183 Chronic kidney disease, stage 3 unspecified: Secondary | ICD-10-CM

## 2016-08-02 DIAGNOSIS — N202 Calculus of kidney with calculus of ureter: Secondary | ICD-10-CM | POA: Diagnosis not present

## 2016-08-03 NOTE — Progress Notes (Signed)
No ICM remote transmission received for 07/31/2016 and next ICM transmission scheduled for 08/16/2016.

## 2016-08-07 DIAGNOSIS — Z7901 Long term (current) use of anticoagulants: Secondary | ICD-10-CM | POA: Diagnosis not present

## 2016-08-08 ENCOUNTER — Other Ambulatory Visit: Payer: Self-pay | Admitting: Internal Medicine

## 2016-08-16 ENCOUNTER — Telehealth: Payer: Self-pay

## 2016-08-16 ENCOUNTER — Ambulatory Visit (INDEPENDENT_AMBULATORY_CARE_PROVIDER_SITE_OTHER): Payer: Medicare HMO

## 2016-08-16 DIAGNOSIS — I5022 Chronic systolic (congestive) heart failure: Secondary | ICD-10-CM

## 2016-08-16 DIAGNOSIS — Z95810 Presence of automatic (implantable) cardiac defibrillator: Secondary | ICD-10-CM

## 2016-08-16 NOTE — Telephone Encounter (Signed)
ICM call to patient and requested she send remote transmission today and she agreed.

## 2016-08-16 NOTE — Progress Notes (Signed)
EPIC Encounter for ICM Monitoring  Patient Name: Emily Abbott is a 76 y.o. female Date: 08/16/2016 Primary Care Physican: Harlan Stains, MD Primary Cardiologist:Taylor Electrophysiologist: Lovena Le Dry Weight: unknown Bi-V Pacing: >99%      Heart Failure questions reviewed, pt asymptomatic.   Thoracic impedance normal   Prescribed dosage: Furosemide 20 mg 2 tablets (40 mg total) daily  LABS from Dr Orest Dikes office 06/20/2015 Creatinine 1.12, BUN 22, Potassium 4.5, Sodium 141, GFR 47.  Recommendations: No changes.   Encouraged to call for fluid symptoms.  Follow-up plan: ICM clinic phone appointment on 09/20/2016.  Office appointment scheduled 08/20/2016 with Dr. Lovena Le.  Copy of ICM check sent to device physician.   3 month ICM trend: 08/16/2016   1 Year ICM trend:      [name], RN 08/16/2016 4:31 PM

## 2016-08-19 DIAGNOSIS — R69 Illness, unspecified: Secondary | ICD-10-CM | POA: Diagnosis not present

## 2016-08-20 ENCOUNTER — Ambulatory Visit (INDEPENDENT_AMBULATORY_CARE_PROVIDER_SITE_OTHER): Payer: Medicare HMO | Admitting: Internal Medicine

## 2016-08-20 ENCOUNTER — Encounter: Payer: Self-pay | Admitting: Internal Medicine

## 2016-08-20 VITALS — BP 148/86 | HR 64 | Ht 66.5 in | Wt 200.0 lb

## 2016-08-20 DIAGNOSIS — Z95810 Presence of automatic (implantable) cardiac defibrillator: Secondary | ICD-10-CM

## 2016-08-20 DIAGNOSIS — I428 Other cardiomyopathies: Secondary | ICD-10-CM

## 2016-08-20 DIAGNOSIS — R413 Other amnesia: Secondary | ICD-10-CM | POA: Diagnosis not present

## 2016-08-20 DIAGNOSIS — Z7901 Long term (current) use of anticoagulants: Secondary | ICD-10-CM | POA: Diagnosis not present

## 2016-08-20 DIAGNOSIS — I480 Paroxysmal atrial fibrillation: Secondary | ICD-10-CM | POA: Diagnosis not present

## 2016-08-20 DIAGNOSIS — I5022 Chronic systolic (congestive) heart failure: Secondary | ICD-10-CM | POA: Diagnosis not present

## 2016-08-20 DIAGNOSIS — R69 Illness, unspecified: Secondary | ICD-10-CM | POA: Diagnosis not present

## 2016-08-20 DIAGNOSIS — E1121 Type 2 diabetes mellitus with diabetic nephropathy: Secondary | ICD-10-CM | POA: Diagnosis not present

## 2016-08-20 DIAGNOSIS — J302 Other seasonal allergic rhinitis: Secondary | ICD-10-CM | POA: Diagnosis not present

## 2016-08-20 NOTE — Progress Notes (Signed)
HPI Emily Abbott returns for ongoing ICD followup. She is a pleasant 76 yo woman with a h/o CAD, s/p MI, chronic systolic heart failure, s/p BiV ICD. She has been stable.  She has occaisional palpitations. She notes dyspnea with exertion. Mild peripheral edema which she treats with diuretic therapy. No ICD shocks. Her blood pressure has been under better control.  She has a h/o PAF. No other complaints today.  Allergies  Allergen Reactions  . Sulfonamide Derivatives     Unknown     Current Outpatient Prescriptions  Medication Sig Dispense Refill  . albuterol (PROAIR HFA) 108 (90 Base) MCG/ACT inhaler Inhale 1-2 puffs into the lungs every 6 (six) hours as needed for wheezing or shortness of breath. 18 g 5  . ALPRAZolam (XANAX) 0.5 MG tablet Take 0.5 mg by mouth 2 (two) times daily as needed.     Marland Kitchen amLODipine (NORVASC) 5 MG tablet Take 5 mg by mouth daily.    Marland Kitchen aspirin 325 MG tablet Take 325 mg by mouth daily.      Marland Kitchen atorvastatin (LIPITOR) 20 MG tablet Take 1 tablet by mouth daily.  1  . BREO ELLIPTA 100-25 MCG/INH AEPB INHALE 1 PUFF DAILY 60 each 0  . carvedilol (COREG) 6.25 MG tablet Take 6.25 mg by mouth 2 (two) times daily with a meal.    . diphenhydrAMINE (BENADRYL) 25 mg capsule Take 25 mg by mouth at bedtime.      . dofetilide (TIKOSYN) 250 MCG capsule Take 1 capsule (250 mcg total) by mouth 2 (two) times daily. 60 capsule 3  . ferrous sulfate 325 (65 FE) MG tablet Take 1 tablet by mouth daily. Reported on 07/12/2015  5  . furosemide (LASIX) 20 MG tablet TAKE 2 TABLETS BY MOUTH EVERY DAY 180 tablet 2  . glucosamine-chondroitin 500-400 MG tablet Take 1 tablet by mouth daily. Reported on 07/12/2015    . HYDROcodone-acetaminophen (VICODIN) 5-500 MG per tablet Take 1 tablet by mouth as directed.     . magnesium gluconate (MAGONATE) 500 MG tablet Take 500 mg by mouth daily. Reported on 07/12/2015    . metFORMIN (GLUCOPHAGE) 850 MG tablet Take 850 mg by mouth 2 (two) times daily with a  meal.      . omeprazole (PRILOSEC) 40 MG capsule Take 40 mg by mouth daily.      Marland Kitchen PARoxetine (PAXIL) 30 MG tablet Take 30 mg by mouth daily.      Marland Kitchen tiotropium (SPIRIVA) 18 MCG inhalation capsule Place 1 capsule (18 mcg total) into inhaler and inhale daily. 30 capsule 6  . vitamin E 200 UNIT capsule Take 200 Units by mouth daily. Reported on 07/12/2015    . warfarin (COUMADIN) 5 MG tablet Use as directed by Alta Corning, MD     . zolpidem (AMBIEN) 10 MG tablet Take 5 mg by mouth at bedtime as needed for sleep.      No current facility-administered medications for this visit.      Past Medical History:  Diagnosis Date  . Anemia   . Anxiety   . CAD (coronary artery disease)   . COPD (chronic obstructive pulmonary disease) ([place])   . Diabetes mellitus    type II  . GERD (gastroesophageal reflux disease)   . HTN (hypertension)    essential  . Hyperlipidemia, mixed   . Mitral valve disorder   . Osteoarthritis   . Panic attack   . Paroxysmal atrial fibrillation (H[facility])   . Pulmonary  embolism ([place])     ROS:   All systems reviewed and negative except as noted in the HPI.   Past Surgical History:  Procedure Laterality Date  . BIOPSY BREAST    . BTL    . CARDIAC VALVE SURGERY     mitral  . carpal tennel  2003     Family History  Problem Relation Age of Onset  . Stroke Mother   . Anuerysm Father   . Diabetes Sister   . Cancer Brother   . Ulcers Sister      Social History   Social History  . Marital status: Widowed    Spouse name: N/A  . Number of children: N/A  . Years of education: N/A   Occupational History  . reitred    Social History Main Topics  . Smoking status: Former Smoker    Packs/day: 1.00    Years: 50.00    Types: Cigarettes    Quit date: 02/19/2001  . Smokeless tobacco: Never Used  . Alcohol use No  . Drug use: Unknown  . Sexual activity: Not on file   Other Topics Concern  . Not on file   Social History Narrative  . No narrative on file       BP (!) 148/86   Pulse 64   Ht 5' 6.5" (1.689 m)   Wt 200 lb (90.7 kg)   BMI 31.80 kg/m  Physical Exam:  Well appearing 76 yo woman, NAD HEENT: Unremarkable Neck:  7 cm JVD, no thyromegally Back:  No CVA tenderness Lungs:  Clear with rales in the bases. HEART:  Regular rate rhythm, no murmurs, no rubs, no clicks Abd:  soft, positive bowel sounds, no organomegally, no rebound, no guarding Ext:  2 plus pulses, trace edema, no cyanosis, no clubbing Skin:  No rashes no nodules Neuro:  CN II through XII intact, motor grossly intact  EKG - nsr with BiV pacing  DEVICE  Normal device function.  See PaceArt for details.   Assess/Plan: 1. Chronic systolic heart failure - her symptoms remain class 2. She will continue her current meds but increase her lasix to 60 mg a day for 3 days and then go back to 40 mg daily. 2. PAF - she is maintaining NSR. No change in meds. 3. ICD - her [employer] BiV ICD is working normally. Will recheck in several months.  Mikle Bosworth.D.

## 2016-08-20 NOTE — Patient Instructions (Signed)
Medication Instructions:  Your physician has recommended you make the following change in your medication: increase furosemide (Lasix) to three tablets (60 mg) daily for three days.  Then return to normal dose of two tablets (40 mg) daily.    Labwork: None ordered.   Testing/Procedures: None ordered.  Follow-Up: Your physician wants you to follow-up in: one year with Dr. Lovena Le.  You will receive a reminder letter in the mail two months in advance. If you don't receive a letter, please call our office to schedule the follow-up appointment.  Remote monitoring is used to monitor your ICD from home. This monitoring reduces the number of office visits required to check your device to one time per year. It allows [place] to keep an eye on the functioning of your device to ensure it is working properly. You are scheduled for a device check from home on 11/19/2016. You may send your transmission at any time that day. If you have a wireless device, the transmission will be sent automatically. After your physician reviews your transmission, you will receive a postcard with your next transmission date.    Any Other Special Instructions Will Be Listed Below (If Applicable).   If you need a refill on your cardiac medications before your next appointment, please call your pharmacy.

## 2016-08-21 LAB — CUP PACEART INCLINIC DEVICE CHECK
Date Time Interrogation Session: 20180702193655
HighPow Impedance: 59.625
Implantable Lead Implant Date: 20110127
Implantable Lead Location: 753859
Implantable Pulse Generator Implant Date: 20110127
Lead Channel Impedance Value: 450 Ohm
Lead Channel Impedance Value: 962.5 Ohm
Lead Channel Pacing Threshold Amplitude: 0.75 V
Lead Channel Pacing Threshold Amplitude: 0.75 V
Lead Channel Pacing Threshold Amplitude: 1 V
Lead Channel Pacing Threshold Pulse Width: 0.5 ms
Lead Channel Pacing Threshold Pulse Width: 0.5 ms
Lead Channel Setting Pacing Amplitude: 2 V
Lead Channel Setting Pacing Amplitude: 2 V
Lead Channel Setting Pacing Pulse Width: 0.5 ms
Lead Channel Setting Sensing Sensitivity: 0.5 mV
MDC IDC LEAD IMPLANT DT: 20110127
MDC IDC LEAD IMPLANT DT: 20110127
MDC IDC LEAD LOCATION: 753858
MDC IDC LEAD LOCATION: 753860
MDC IDC MSMT BATTERY REMAINING LONGEVITY: 11 mo
MDC IDC MSMT LEADCHNL LV PACING THRESHOLD PULSEWIDTH: 0.5 ms
MDC IDC MSMT LEADCHNL LV PACING THRESHOLD PULSEWIDTH: 0.5 ms
MDC IDC MSMT LEADCHNL RA IMPEDANCE VALUE: 375 Ohm
MDC IDC MSMT LEADCHNL RA PACING THRESHOLD AMPLITUDE: 1 V
MDC IDC MSMT LEADCHNL RA SENSING INTR AMPL: 2.2 mV
MDC IDC MSMT LEADCHNL RV PACING THRESHOLD AMPLITUDE: 0.75 V
MDC IDC MSMT LEADCHNL RV PACING THRESHOLD AMPLITUDE: 0.75 V
MDC IDC MSMT LEADCHNL RV PACING THRESHOLD PULSEWIDTH: 0.5 ms
MDC IDC MSMT LEADCHNL RV PACING THRESHOLD PULSEWIDTH: 0.5 ms
MDC IDC MSMT LEADCHNL RV SENSING INTR AMPL: 12 mV
MDC IDC SET LEADCHNL LV PACING AMPLITUDE: 2 V
MDC IDC SET LEADCHNL LV PACING PULSEWIDTH: 0.5 ms
MDC IDC STAT BRADY RA PERCENT PACED: 32 %
MDC IDC STAT BRADY RV PERCENT PACED: 99.72 %
Pulse Gen Serial Number: 752248

## 2016-08-24 DIAGNOSIS — J438 Other emphysema: Secondary | ICD-10-CM | POA: Diagnosis not present

## 2016-08-28 ENCOUNTER — Encounter: Payer: Self-pay | Admitting: Registered"

## 2016-08-28 ENCOUNTER — Encounter: Payer: Medicare HMO | Attending: Family Medicine | Admitting: Registered"

## 2016-08-28 DIAGNOSIS — E1121 Type 2 diabetes mellitus with diabetic nephropathy: Secondary | ICD-10-CM | POA: Diagnosis not present

## 2016-08-28 DIAGNOSIS — E118 Type 2 diabetes mellitus with unspecified complications: Secondary | ICD-10-CM

## 2016-08-28 DIAGNOSIS — Z713 Dietary counseling and surveillance: Secondary | ICD-10-CM | POA: Insufficient documentation

## 2016-08-28 NOTE — Progress Notes (Signed)
Diabetes Self-Management Education  Visit Type: First/Initial  Appt. Start Time: 1400 Appt. End Time: 1017  08/28/2016  [name], identified by name and date of birth, is a 76 y.o. female with a diagnosis of Diabetes: Type 2.   ASSESSMENT This patient is accompanied in the office by her daughter. Pt lives with her daughter who does the shopping and prepares dinners. Pt requested this referral for DSME refresher. Per pt referral order metformin has been discontinued due to kidney function and without medication pt states her blood sugar was getting as high as 300. Prior to this patient has had well controlled DM. Pt is now on Trajenta and fasting blood sugar is not as well controlled as it was with Metformin. RD discussed diet and lifestyle changes which may help and advised to return for follow-up if fasting blood sugar stay in the higher range.      Diabetes Self-Management Education - 08/28/16 1424      Visit Information   Visit Type First/Initial     Initial Visit   Diabetes Type Type 2   Are you currently following a meal plan? No   Are you taking your medications as prescribed? Yes  trajenta   Date Diagnosed 12 or more yrs ago     Health Coping   How would you rate your overall health? Good     Psychosocial Assessment   Patient Belief/Attitude about Diabetes Motivated to manage diabetes   What is the last grade level you completed in school? 51WC     Complications   Last HgB A1C per patient/outside source 6.5 %  per pt   How often do you check your blood sugar? 1-2 times/day   Fasting Blood glucose range (mg/dL) 130-179   Number of hypoglycemic episodes per month 0   Number of hyperglycemic episodes per week 1   Can you tell when your blood sugar is high? No   Have you had a dilated eye exam in the past 12 months? Yes   Have you had a dental exam in the past 12 months? No  dentures   Are you checking your feet? Yes   How many days per week are you checking  your feet? 6     Dietary Intake   Breakfast cheerios, w almond milk OR atkins bar coffee, cream and/or sweet tea   Snack (morning) none OR kind bars   Lunch none or 1/2 sandwich OR walnuts   Dinner meat, 2 veggies  ~6 pm   Snack (evening) none OR popcorn, adkins lemon bars OR nuts OR 1-2 week sweet    Beverage(s) coffee, water, sweet tea about ~12 oz, occassionally soda     Exercise   Exercise Type ADL's   How many days per week to you exercise? 0   How many minutes per day do you exercise? 0   Total minutes per week of exercise 0     Patient Education   Previous Diabetes Education Yes (please comment)  years ago   Nutrition management  Role of diet in the treatment of diabetes and the relationship between the three main macronutrients and blood glucose level;Carbohydrate counting;Reviewed blood glucose goals for pre and post meals and how to evaluate the patients' food intake on their blood glucose level.   Physical activity and exercise  Role of exercise on diabetes management, blood pressure control and cardiac health.   Monitoring Identified appropriate SMBG and/or A1C goals.     Individualized Goals (developed by  patient)   Nutrition Follow meal plan discussed   Physical Activity Exercise 3-5 times per week   Reducing Risk increase portions of nuts and seeds     Outcomes   Expected Outcomes Demonstrated interest in learning. Expect positive outcomes   Future DMSE PRN   Program Status Completed    Individualized Plan for Diabetes Self-Management Training:   Learning Objective:  Patient will have a greater understanding of diabetes self-management. Patient education plan is to attend individual and/or group sessions per assessed needs and concerns.  Patient Instructions  Consider having oatmeal more often and include walnuts, may want to try soy milk because it has more protein than almond milk  Berries and nuts are good to have daily Consider water aerobics 3-5 times per  week Consider doing some sort of body movement everyday Aim for 45g carbohydrates per meal, 15 g carbohydrates for snacks Balance meals and snacks with protein and a variety of vegetables Consider including more plant proteins in the day.  Expected Outcomes:  Demonstrated interest in learning. Expect positive outcomes  Education material provided: A1C conversion sheet, My Plate and Carbohydrate counting sheet  If problems or questions, patient to contact team via:  Phone and MyChart  Future DSME appointment: PRN

## 2016-08-28 NOTE — Patient Instructions (Addendum)
Consider having oatmeal more often and include walnuts, may want to try soy milk because it has more protein than almond milk  Berries and nuts are good to have daily Consider water aerobics 3-5 times per week Consider doing some sort of body movement everyday Aim for 45g carbohydrates per meal, 15 g carbohydrates for snacks Balance meals and snacks with protein and a variety of vegetables Consider including more plant proteins in the day.

## 2016-08-31 ENCOUNTER — Telehealth: Payer: Self-pay | Admitting: Cardiology

## 2016-08-31 NOTE — Telephone Encounter (Signed)
Spoke w/ pt and requested that she send a manual transmission b/c her home monitor has not updated in at least 7 days.   

## 2016-09-03 DIAGNOSIS — I10 Essential (primary) hypertension: Secondary | ICD-10-CM | POA: Diagnosis not present

## 2016-09-03 DIAGNOSIS — E559 Vitamin D deficiency, unspecified: Secondary | ICD-10-CM | POA: Diagnosis not present

## 2016-09-03 DIAGNOSIS — E785 Hyperlipidemia, unspecified: Secondary | ICD-10-CM | POA: Diagnosis not present

## 2016-09-03 DIAGNOSIS — R0600 Dyspnea, unspecified: Secondary | ICD-10-CM | POA: Diagnosis not present

## 2016-09-03 DIAGNOSIS — E118 Type 2 diabetes mellitus with unspecified complications: Secondary | ICD-10-CM | POA: Diagnosis not present

## 2016-09-05 ENCOUNTER — Other Ambulatory Visit: Payer: Self-pay | Admitting: Internal Medicine

## 2016-09-05 DIAGNOSIS — I480 Paroxysmal atrial fibrillation: Secondary | ICD-10-CM

## 2016-09-06 ENCOUNTER — Other Ambulatory Visit: Payer: Self-pay | Admitting: Internal Medicine

## 2016-09-11 ENCOUNTER — Encounter: Payer: Self-pay | Admitting: Podiatry

## 2016-09-11 ENCOUNTER — Ambulatory Visit (INDEPENDENT_AMBULATORY_CARE_PROVIDER_SITE_OTHER): Payer: Medicare HMO | Admitting: Podiatry

## 2016-09-11 DIAGNOSIS — E1151 Type 2 diabetes mellitus with diabetic peripheral angiopathy without gangrene: Secondary | ICD-10-CM

## 2016-09-11 DIAGNOSIS — Q828 Other specified congenital malformations of skin: Secondary | ICD-10-CM | POA: Diagnosis not present

## 2016-09-11 DIAGNOSIS — M216X9 Other acquired deformities of unspecified foot: Secondary | ICD-10-CM

## 2016-09-11 DIAGNOSIS — B351 Tinea unguium: Secondary | ICD-10-CM | POA: Diagnosis not present

## 2016-09-11 NOTE — Progress Notes (Signed)
Patient ID: Emily Abbott, female   DOB: 1941/02/15, 76 y.o.   MRN: [id number] Complaint:  Visit Type: Patient returns to my office for continued preventative foot care services. Complaint: Patient states" my nails have grown long and thick and become painful to walk and wear shoes" Patient has been diagnosed with DM with no foot complications. The patient presents for preventative foot care services. No changes to ROS  Podiatric Exam: Vascular: dorsalis pedis and posterior tibial pulses are palpable bilateral. Capillary return is immediate. Temperature gradient is WNL. Skin turgor WNL  Sensorium: Normal Semmes Weinstein monofilament test. Normal tactile sensation bilaterally. Nail Exam: Pt has thick disfigured discolored nails with subungual debris noted bilateral entire nail hallux through fifth toenails Ulcer Exam: There is no evidence of ulcer or pre-ulcerative changes or infection. Orthopedic Exam: Muscle tone and strength are WNL. No limitations in general ROM. No crepitus or effusions noted. Foot type and digits show no abnormalities. Bony prominences are unremarkable. Skin: Porokeratosis sub 5th left foot. No infection or ulcers  Diagnosis:  Onychomycosis, , Pain in right toe, pain in left toes, Porokeratosis Sub 5th left foot.  Treatment & Plan Procedures and Treatment: Consent by patient was obtained for treatment procedures. The patient understood the discussion of treatment and procedures well. All questions were answered thoroughly reviewed. Debridement of mycotic and hypertrophic toenails, 1 through 5 bilateral and clearing of subungual debris. No ulceration, no infection noted. Debride porokeratosis. Return Visit-Office Procedure: Patient instructed to return to the office for a follow up visit 3 months for continued evaluation and treatment.    Gardiner Barefoot DPM

## 2016-09-12 ENCOUNTER — Telehealth: Payer: Self-pay | Admitting: Cardiology

## 2016-09-12 NOTE — Telephone Encounter (Signed)
Spoke w/ pt and requested that she send a manual transmission b/c her home monitor has not updated in at least 7 days.   

## 2016-09-18 ENCOUNTER — Telehealth: Payer: Self-pay

## 2016-09-18 NOTE — Telephone Encounter (Signed)
Returned call to patient as requested by voice mail.  She asked if remote transmission was received and I confirmed it was but have not reviewed it yet.  Advised would call back on Thursday after I review the transmission.  She stated she is feeling fine.

## 2016-09-19 DIAGNOSIS — E213 Hyperparathyroidism, unspecified: Secondary | ICD-10-CM | POA: Diagnosis not present

## 2016-09-19 DIAGNOSIS — I480 Paroxysmal atrial fibrillation: Secondary | ICD-10-CM | POA: Diagnosis not present

## 2016-09-19 DIAGNOSIS — Z7901 Long term (current) use of anticoagulants: Secondary | ICD-10-CM | POA: Diagnosis not present

## 2016-09-20 ENCOUNTER — Ambulatory Visit (INDEPENDENT_AMBULATORY_CARE_PROVIDER_SITE_OTHER): Payer: Medicare HMO | Admitting: *Deleted

## 2016-09-20 ENCOUNTER — Other Ambulatory Visit (HOSPITAL_COMMUNITY): Payer: Self-pay | Admitting: Surgery

## 2016-09-20 DIAGNOSIS — Z95810 Presence of automatic (implantable) cardiac defibrillator: Secondary | ICD-10-CM

## 2016-09-20 DIAGNOSIS — I428 Other cardiomyopathies: Secondary | ICD-10-CM

## 2016-09-20 DIAGNOSIS — I5022 Chronic systolic (congestive) heart failure: Secondary | ICD-10-CM | POA: Diagnosis not present

## 2016-09-20 DIAGNOSIS — E213 Hyperparathyroidism, unspecified: Secondary | ICD-10-CM

## 2016-09-20 NOTE — Progress Notes (Signed)
EPIC Encounter for ICM Monitoring  Patient Name: Emily Abbott is a 76 y.o. female Date: 09/20/2016 Primary Care Physican: Harlan Stains, MD Primary Cardiologist:Taylor Electrophysiologist: Lovena Le Dry Weight: unknown Bi-V Pacing: >99%        Heart Failure questions reviewed, pt asymptomatic   Thoracic impedance normal.  Prescribed dosage: Furosemide 20 mg 2 tablets (40 mg total) daily  LABS from Dr Orest Dikes office 06/20/2015 Creatinine 1.12, BUN 22, Potassium 4.5, Sodium 141, GFR 47.  Recommendations: No changes.   Encouraged to call for fluid symptoms.  Follow-up plan: ICM clinic phone appointment on 10/23/2016.    Copy of ICM check sent to device physician.   3 month ICM trend: 09/20/2016   1 Year ICM trend:      [name], RN 09/20/2016 3:26 PM

## 2016-09-20 NOTE — Progress Notes (Signed)
Remote ICD transmission.   

## 2016-09-21 ENCOUNTER — Encounter: Payer: Self-pay | Admitting: Cardiology

## 2016-09-24 DIAGNOSIS — J438 Other emphysema: Secondary | ICD-10-CM | POA: Diagnosis not present

## 2016-09-28 ENCOUNTER — Encounter (HOSPITAL_COMMUNITY)
Admission: RE | Admit: 2016-09-28 | Discharge: 2016-09-28 | Disposition: A | Discharge status: Home / Self Care | Payer: Medicare HMO | Source: Ambulatory Visit | Attending: Surgery | Admitting: Surgery

## 2016-09-28 DIAGNOSIS — E213 Hyperparathyroidism, unspecified: Secondary | ICD-10-CM | POA: Diagnosis not present

## 2016-09-28 DIAGNOSIS — D351 Benign neoplasm of parathyroid gland: Secondary | ICD-10-CM | POA: Diagnosis not present

## 2016-09-28 DIAGNOSIS — Z7901 Long term (current) use of anticoagulants: Secondary | ICD-10-CM | POA: Diagnosis not present

## 2016-09-28 MED ORDER — TECHNETIUM TC 99M SESTAMIBI - CARDIOLITE
25.0000 | Freq: Once | INTRAVENOUS | Status: AC | PRN
  Administered 2016-09-28: 11:00:00 25 via INTRAVENOUS

## 2016-10-03 ENCOUNTER — Ambulatory Visit (INDEPENDENT_AMBULATORY_CARE_PROVIDER_SITE_OTHER): Payer: Medicare HMO | Admitting: Podiatry

## 2016-10-03 ENCOUNTER — Encounter: Payer: Self-pay | Admitting: Podiatry

## 2016-10-03 DIAGNOSIS — M79606 Pain in leg, unspecified: Secondary | ICD-10-CM

## 2016-10-03 DIAGNOSIS — E1151 Type 2 diabetes mellitus with diabetic peripheral angiopathy without gangrene: Secondary | ICD-10-CM | POA: Diagnosis not present

## 2016-10-04 NOTE — Progress Notes (Signed)
Subjective:    Patient ID: Emily Abbott, female   DOB: 76 y.o.   MRN: [id number]   HPI patient is a diabetic concern with a small blister on the right big toe    ROS      Objective:  Physical Exam neurovascular status unchanged with sharp Dole vibratory intact with patient found have a small area on the right hallux medial side localized in nature with no proximal edema erythema drainage noted     Assessment:    Small localized blister with no indication of proximal infection     Plan:   Applied sterile dressing to this instructed on wider shoes and it should be uneventful and healing and reappoint if any issues occur

## 2016-10-05 DIAGNOSIS — R69 Illness, unspecified: Secondary | ICD-10-CM | POA: Diagnosis not present

## 2016-10-08 ENCOUNTER — Other Ambulatory Visit: Payer: Self-pay | Admitting: Surgery

## 2016-10-08 DIAGNOSIS — E213 Hyperparathyroidism, unspecified: Secondary | ICD-10-CM

## 2016-10-16 ENCOUNTER — Ambulatory Visit
Admission: RE | Admit: 2016-10-16 | Discharge: 2016-10-16 | Disposition: A | Discharge status: Home / Self Care | Payer: Medicare HMO | Source: Ambulatory Visit | Attending: Surgery | Admitting: Surgery

## 2016-10-16 DIAGNOSIS — E042 Nontoxic multinodular goiter: Secondary | ICD-10-CM | POA: Diagnosis not present

## 2016-10-16 DIAGNOSIS — R252 Cramp and spasm: Secondary | ICD-10-CM | POA: Diagnosis not present

## 2016-10-16 DIAGNOSIS — E213 Hyperparathyroidism, unspecified: Secondary | ICD-10-CM

## 2016-10-16 DIAGNOSIS — Z7901 Long term (current) use of anticoagulants: Secondary | ICD-10-CM | POA: Diagnosis not present

## 2016-10-17 ENCOUNTER — Encounter (HOSPITAL_COMMUNITY): Payer: Self-pay | Admitting: Family Medicine

## 2016-10-17 ENCOUNTER — Ambulatory Visit (INDEPENDENT_AMBULATORY_CARE_PROVIDER_SITE_OTHER): Payer: Medicare HMO

## 2016-10-17 ENCOUNTER — Ambulatory Visit (HOSPITAL_COMMUNITY)
Admission: EM | Admit: 2016-10-17 | Discharge: 2016-10-17 | Disposition: A | Discharge status: Home / Self Care | Payer: Medicare HMO | Attending: Family Medicine | Admitting: Family Medicine

## 2016-10-17 DIAGNOSIS — M79642 Pain in left hand: Secondary | ICD-10-CM | POA: Diagnosis not present

## 2016-10-17 DIAGNOSIS — M25532 Pain in left wrist: Secondary | ICD-10-CM

## 2016-10-17 DIAGNOSIS — S6992XA Unspecified injury of left wrist, hand and finger(s), initial encounter: Secondary | ICD-10-CM | POA: Diagnosis not present

## 2016-10-17 NOTE — ED Provider Notes (Signed)
[facility]    CSN: [id number] Arrival date & time: 10/17/16  [place]     History   Chief Complaint Chief Complaint  Patient presents with  . Hand Injury    HPI Emily Abbott is a 76 y.o. female.   76 year old female with history of CAD, COPD, diabetes, A. fib/pulmonary embolism on Coumadin comes in for 2 day history of left wrist pain after falling. Patient states she was holding a cup impact on both her hand while going up the stairs yesterday, caught her foot on the stairs, and fell. She thinks she fell with hands stretched out. She has been doing ice compresses with good relief of swelling. But she continues to have this left wrist swelling, pain with bruising. She called PCP, who is able to see her tomorrow, but given increased pain, she came in to make sure it wasn't broken. No open wounds, denies numbness, tingling. Denies head injury, loss of consciousness. Denies dizziness, weakness, lightheadedness that caused the fall.      Past Medical History:  Diagnosis Date  . Anemia   . Anxiety   . CAD (coronary artery disease)   . COPD (chronic obstructive pulmonary disease) ([place])   . Diabetes mellitus    type II  . GERD (gastroesophageal reflux disease)   . HTN (hypertension)    essential  . Hyperlipidemia, mixed   . Mitral valve disorder   . Osteoarthritis   . Panic attack   . Paroxysmal atrial fibrillation (H[facility])   . Pulmonary embolism [facility])     Patient Active Problem List   Diagnosis Date Noted  . Obstructive sleep apnea 02/22/2015  . Lung nodule, solitary 11/24/2014  . Dyspnea and respiratory abnormality 11/10/2014  . Hoarseness of voice 11/10/2014  . Smoking history 11/10/2014  . History of pulmonary embolism 11/10/2014  . Biventricular implantable cardioverter-defibrillator in situ 07/10/2011  . ORTHOSTATIC DIZZINESS 10/10/2009  . CHRONIC SYSTOLIC HEART FAILURE 70/62/3762  . DIABETES MELLITUS, TYPE II 11/12/2008  . HYPERLIPIDEMIA, MIXED  11/12/2008  . ANEMIA 11/12/2008  . ANXIETY 11/12/2008  . PANIC ATTACK 11/12/2008  . Essential hypertension 11/12/2008  . Coronary atherosclerosis 11/12/2008  . MITRAL VALVE DISORDER 11/12/2008  . PAROXYSMAL ATRIAL FIBRILLATION 11/12/2008  . COPD 11/12/2008  . GERD 11/12/2008  . OSTEOARTHRITIS 11/12/2008  . PULMONARY EMBOLISM, HX OF 11/12/2008    Past Surgical History:  Procedure Laterality Date  . BIOPSY BREAST    . BTL    . CARDIAC VALVE SURGERY     mitral  . carpal tennel  2003    OB History    No data available       Home Medications    Prior to Admission medications   Medication Sig Start Date End Date Taking? Authorizing Provider  albuterol (PROAIR HFA) 108 (90 Base) MCG/ACT inhaler Inhale 1-2 puffs into the lungs every 6 (six) hours as needed for wheezing or shortness of breath. 08/12/15   Brand Males, MD  ALPRAZolam Duanne Moron) 0.5 MG tablet Take 0.5 mg by mouth 2 (two) times daily as needed.     [provider]  amLODipine (NORVASC) 5 MG tablet Take 5 mg by mouth daily.    [provider]  aspirin 325 MG tablet Take 325 mg by mouth daily.      [provider]  atorvastatin (LIPITOR) 20 MG tablet Take 1 tablet by mouth daily. 06/21/15   [provider]  BREO ELLIPTA 100-25 MCG/INH AEPB INHALE 1 PUFF DAILY 08/08/16  Brand Males, MD  carvedilol (COREG) 6.25 MG tablet Take 6.25 mg by mouth 2 (two) times daily with a meal.    [provider]  diphenhydrAMINE (BENADRYL) 25 mg capsule Take 25 mg by mouth at bedtime.      [provider]  dofetilide (TIKOSYN) 250 MCG capsule TAKE ONE CAPSULE BY MOUTH TWICE A DAY 09/05/16   Nahser, Wonda Cheng, MD  ezetimibe (ZETIA) 10 MG tablet Take 10 mg by mouth daily.    [provider]  ferrous sulfate 325 (65 FE) MG tablet Take 1 tablet by mouth daily. Reported on 07/12/2015 06/21/15   [provider]  furosemide (LASIX) 20 MG tablet TAKE 2 TABLETS BY MOUTH EVERY  DAY 10/03/15   Evans Lance, MD  glucosamine-chondroitin 500-400 MG tablet Take 1 tablet by mouth daily. Reported on 07/12/2015    [provider]  HYDROcodone-acetaminophen (VICODIN) 5-500 MG per tablet Take 1 tablet by mouth as directed.     [provider]  linagliptin (TRADJENTA) 5 MG TABS tablet Take 5 mg by mouth daily.    [provider]  magnesium gluconate (MAGONATE) 500 MG tablet Take 500 mg by mouth daily. Reported on 07/12/2015    [provider]  metFORMIN (GLUCOPHAGE) 850 MG tablet Take 850 mg by mouth 2 (two) times daily with a meal.      [provider]  omeprazole (PRILOSEC) 40 MG capsule Take 40 mg by mouth daily.      [provider]  PARoxetine (PAXIL) 30 MG tablet Take 30 mg by mouth daily.      [provider]  tiotropium (SPIRIVA) 18 MCG inhalation capsule Place 1 capsule (18 mcg total) into inhaler and inhale daily. 04/01/15   Brand Males, MD  tiZANidine (ZANAFLEX) 2 MG tablet Take by mouth every 6 (six) hours as needed for muscle spasms.    [provider]  Vitamin D, Ergocalciferol, (DRISDOL) 50000 units CAPS capsule Take 50,000 Units by mouth every 7 (seven) days.    [provider]  vitamin E 200 UNIT capsule Take 200 Units by mouth daily. Reported on 07/12/2015    [provider]  warfarin (COUMADIN) 5 MG tablet Use as directed by Alta Corning, MD     [provider]  zolpidem (AMBIEN) 10 MG tablet Take 5 mg by mouth at bedtime as needed for sleep.  06/30/11   [provider]    Family History Family History  Problem Relation Age of Onset  . Stroke Mother   . Anuerysm Father   . Diabetes Sister   . Cancer Brother   . Ulcers Sister     Social History Social History  Substance Use Topics  . Smoking status: Former Smoker    Packs/day: 1.00    Years: 50.00    Types: Cigarettes    Quit date: 02/19/2001  . Smokeless tobacco: Never Used  . Alcohol use  No     Allergies   Sulfonamide derivatives   Review of Systems Review of Systems  Reason unable to perform ROS: see HPI as above.     Physical Exam Triage Vital Signs ED Triage Vitals  Enc Vitals Group     BP 10/17/16 1914 (!) 158/76     Pulse Rate 10/17/16 1914 67     Resp 10/17/16 1914 18     Temp 10/17/16 1914 98.6 F (37 C)     Temp src --      SpO2 10/17/16 1914 98 %  Weight --      Height --      Head Circumference --      Peak Flow --      Pain Score 10/17/16 1913 10     Pain Loc --      Pain Edu? --      Excl. in [place]? --    No data found.   Updated Vital Signs BP (!) 158/76   Pulse 67   Temp 98.6 F (37 C)   Resp 18   SpO2 98%   Visual Acuity Right Eye Distance:   Left Eye Distance:   Bilateral Distance:    Right Eye Near:   Left Eye Near:    Bilateral Near:     Physical Exam  Constitutional: She is oriented to person, place, and time. She appears well-developed and well-nourished. No distress.  HENT:  Head: Normocephalic and atraumatic.  Musculoskeletal:  Left radial swelling and contusion. Tenderness on palpation of the wrist, radial greater than ulnar. Full range of motion although with pain. Sensation intact and equal bilaterally.  Neurological: She is alert and oriented to person, place, and time.     UC Treatments / Results  Labs (all labs ordered are listed, but only abnormal results are displayed) Labs Reviewed - No data to display  EKG  EKG Interpretation None       Radiology US Soft Tissue Head And Neck  Result Date: 10/16/2016 CLINICAL DATA:  Hyperparathyroid.  Negative scintigraphy. EXAM: THYROID ULTRASOUND TECHNIQUE: Ultrasound examination of the thyroid gland and adjacent soft tissues was performed. COMPARISON:  Scintigraphy 09/28/2016 and previous FINDINGS: Parenchymal Echotexture: Mildly heterogenous Isthmus: 0.3 cm thickness Right lobe: 4.5 x 2.2 x 1.9 cm Left lobe: 3.7 x 1.2 x 1.9 cm  _________________________________________________________ Estimated total number of nodules >/= 1 cm: 1 Number of spongiform nodules >/=  2 cm not described below (TR1): 0 Number of mixed cystic and solid nodules >/= 1.5 cm not described below ([place]): 0 _________________________________________________________ Nodule # 1: Location: Left; Inferior Maximum size: 2 cm; Other 2 dimensions: 1.6 x 1.3 cm Composition: mixed cystic and solid (1) Echogenicity: isoechoic (1) Shape: not taller-than-wide (0) Margins: ill-defined (0) Echogenic foci: none (0) ACR TI-RADS total points: 2. ACR TI-RADS risk category: TR2 (2 points). ACR TI-RADS recommendations: This nodule does NOT meet TI-RADS criteria for biopsy or dedicated follow-up. _________________________________________________________ 0.8 x 0.7 cm hypoechoic nodule without calcifications, posterior to the mid left lobe. At least 4 small subcentimeter isoechoic or cystic nodules in the right lobe. IMPRESSION: 1. Normal-sized thyroid with small bilateral nodules. None meets criteria for biopsy or dedicated imaging follow-up. The above is in keeping with the ACR TI-RADS recommendations - J Am Coll Radiol 2017;14:587-595. Electronically Signed   By: Lucrezia Europe M.D.   On: 10/16/2016 14:22   Dg Hand Complete Left  Result Date: 10/17/2016 CLINICAL DATA:  Fall with injury to the hands, hematoma and pain EXAM: LEFT HAND - COMPLETE 3+ VIEW COMPARISON:  05/30/2016 FINDINGS: No acute displaced fracture or malalignment. Moderate degenerative changes at the first [facility] joint. Diffuse degenerative changes at the DIP and PIP joints. No radiopaque foreign body. IMPRESSION: 1. No acute osseous abnormality 2. Degenerative changes of the digits and first [facility] joint Electronically Signed   By: Donavan Foil M.D.   On: 10/17/2016 19:43    Procedures Procedures (including critical care time)  Medications Ordered in UC Medications - No data to display   Initial Impression / Assessment  and Plan /  UC Course  I have reviewed the triage vital signs and the nursing notes.  Pertinent labs & imaging results that were available during my care of the patient were reviewed by me and considered in my medical decision making (see chart for details).   x-ray negative for dislocation or fracture. Given patient on Coumadin, will refrain from NSAID use. Patient to continue ice compress. Wrist splint for activity. Patient to follow up with PCP for further evaluation and treatment. Return precautions given.  Final Clinical Impressions(s) / UC Diagnoses   Final diagnoses:  Left wrist pain    New Prescriptions New Prescriptions   No medications on file       Emily Abbott 10/17/16 2009

## 2016-10-17 NOTE — ED Triage Notes (Signed)
Pt here for injury to left hand an wrist. sts that she fel injuring it yesterday. Bruising and swelling noted.

## 2016-10-17 NOTE — Discharge Instructions (Signed)
X-ray negative for fracture or dislocation. Continue ice compress. Wear wrist splint during activity. Monitor for any worsening of symptoms, increased swelling, increased pain, numbness, tingling, follow-up with your PCP or here for reevaluation.

## 2016-10-23 ENCOUNTER — Ambulatory Visit (INDEPENDENT_AMBULATORY_CARE_PROVIDER_SITE_OTHER): Payer: Medicare HMO

## 2016-10-23 DIAGNOSIS — Z95810 Presence of automatic (implantable) cardiac defibrillator: Secondary | ICD-10-CM | POA: Diagnosis not present

## 2016-10-23 DIAGNOSIS — I5022 Chronic systolic (congestive) heart failure: Secondary | ICD-10-CM | POA: Diagnosis not present

## 2016-10-23 NOTE — Progress Notes (Signed)
EPIC Encounter for ICM Monitoring  Patient Name: Emily Abbott is a 76 y.o. female Date: 10/23/2016 Primary Care Physican: Harlan Stains, MD Primary Cardiologist:Taylor Electrophysiologist: Lovena Le Dry Weight: unknown Bi-V Pacing: >99%                                            Heart Failure questions reviewed, pt asymptomatic.   Thoracic impedance normal but was abnormal suggesting fluid accumulation from 9/1 until today.  Prescribed dosage: Furosemide 20 mg 2 tablets (40 mg total) daily.  She forgot to take her Furosemide in the last couple of days.   LABS from Dr Orest Dikes office 06/20/2015 Creatinine 1.12, BUN 22, Potassium 4.5, Sodium 141, GFR 47.  Recommendations:  No changes.  Encouraged to call for fluid symptoms.  Follow-up plan: ICM clinic phone appointment on 11/23/2016.    Copy of ICM check sent to Dr. Lovena Le.   3 month ICM trend: 10/23/2016   1 Year ICM trend:      [name], RN 10/23/2016 10:47 AM

## 2016-10-25 DIAGNOSIS — J438 Other emphysema: Secondary | ICD-10-CM | POA: Diagnosis not present

## 2016-10-29 LAB — CUP PACEART REMOTE DEVICE CHECK
Brady Statistic RV Percent Paced: 99 %
HIGH POWER IMPEDANCE MEASURED VALUE: 60 Ohm
Implantable Lead Implant Date: 20110127
Implantable Lead Location: 753860
Implantable Lead Model: 7122
Lead Channel Impedance Value: 360 Ohm
Lead Channel Impedance Value: 440 Ohm
Lead Channel Pacing Threshold Amplitude: 0.75 V
Lead Channel Pacing Threshold Pulse Width: 0.5 ms
Lead Channel Pacing Threshold Pulse Width: 0.5 ms
Lead Channel Sensing Intrinsic Amplitude: 1.6 mV
Lead Channel Setting Pacing Amplitude: 2 V
Lead Channel Setting Pacing Amplitude: 2 V
Lead Channel Setting Pacing Pulse Width: 0.5 ms
Lead Channel Setting Sensing Sensitivity: 0.5 mV
MDC IDC LEAD IMPLANT DT: 20110127
MDC IDC LEAD IMPLANT DT: 20110127
MDC IDC LEAD LOCATION: 753858
MDC IDC LEAD LOCATION: 753859
MDC IDC MSMT LEADCHNL LV IMPEDANCE VALUE: 860 Ohm
MDC IDC MSMT LEADCHNL RV PACING THRESHOLD AMPLITUDE: 0.625 V
MDC IDC PG IMPLANT DT: 20110127
MDC IDC PG SERIAL: 752248
MDC IDC SESS DTM: 20180910163917
MDC IDC SET LEADCHNL RV PACING AMPLITUDE: 2 V
MDC IDC SET LEADCHNL RV PACING PULSEWIDTH: 0.5 ms
MDC IDC STAT BRADY RA PERCENT PACED: 47 %

## 2016-11-01 DIAGNOSIS — E118 Type 2 diabetes mellitus with unspecified complications: Secondary | ICD-10-CM | POA: Diagnosis not present

## 2016-11-01 DIAGNOSIS — R0600 Dyspnea, unspecified: Secondary | ICD-10-CM | POA: Diagnosis not present

## 2016-11-01 DIAGNOSIS — N183 Chronic kidney disease, stage 3 (moderate): Secondary | ICD-10-CM | POA: Diagnosis not present

## 2016-11-01 DIAGNOSIS — G4733 Obstructive sleep apnea (adult) (pediatric): Secondary | ICD-10-CM | POA: Diagnosis not present

## 2016-11-01 DIAGNOSIS — I10 Essential (primary) hypertension: Secondary | ICD-10-CM | POA: Diagnosis not present

## 2016-11-01 DIAGNOSIS — E559 Vitamin D deficiency, unspecified: Secondary | ICD-10-CM | POA: Diagnosis not present

## 2016-11-01 DIAGNOSIS — R109 Unspecified abdominal pain: Secondary | ICD-10-CM | POA: Diagnosis not present

## 2016-11-01 DIAGNOSIS — E785 Hyperlipidemia, unspecified: Secondary | ICD-10-CM | POA: Diagnosis not present

## 2016-11-02 ENCOUNTER — Other Ambulatory Visit: Payer: Self-pay | Admitting: Surgery

## 2016-11-02 DIAGNOSIS — E213 Hyperparathyroidism, unspecified: Secondary | ICD-10-CM

## 2016-11-06 DIAGNOSIS — E559 Vitamin D deficiency, unspecified: Secondary | ICD-10-CM | POA: Diagnosis not present

## 2016-11-06 DIAGNOSIS — R109 Unspecified abdominal pain: Secondary | ICD-10-CM | POA: Diagnosis not present

## 2016-11-06 DIAGNOSIS — E785 Hyperlipidemia, unspecified: Secondary | ICD-10-CM | POA: Diagnosis not present

## 2016-11-06 DIAGNOSIS — R0600 Dyspnea, unspecified: Secondary | ICD-10-CM | POA: Diagnosis not present

## 2016-11-06 DIAGNOSIS — I10 Essential (primary) hypertension: Secondary | ICD-10-CM | POA: Diagnosis not present

## 2016-11-06 DIAGNOSIS — G4733 Obstructive sleep apnea (adult) (pediatric): Secondary | ICD-10-CM | POA: Diagnosis not present

## 2016-11-06 DIAGNOSIS — E118 Type 2 diabetes mellitus with unspecified complications: Secondary | ICD-10-CM | POA: Diagnosis not present

## 2016-11-06 DIAGNOSIS — N183 Chronic kidney disease, stage 3 (moderate): Secondary | ICD-10-CM | POA: Diagnosis not present

## 2016-11-08 ENCOUNTER — Ambulatory Visit (INDEPENDENT_AMBULATORY_CARE_PROVIDER_SITE_OTHER)
Admission: RE | Admit: 2016-11-08 | Discharge: 2016-11-08 | Disposition: A | Discharge status: Home / Self Care | Payer: Medicare HMO | Source: Ambulatory Visit | Attending: Internal Medicine | Admitting: Internal Medicine

## 2016-11-08 DIAGNOSIS — R911 Solitary pulmonary nodule: Secondary | ICD-10-CM

## 2016-11-08 DIAGNOSIS — R918 Other nonspecific abnormal finding of lung field: Secondary | ICD-10-CM | POA: Diagnosis not present

## 2016-11-09 ENCOUNTER — Encounter: Payer: Self-pay | Admitting: Adult Health

## 2016-11-09 ENCOUNTER — Ambulatory Visit (INDEPENDENT_AMBULATORY_CARE_PROVIDER_SITE_OTHER): Payer: Medicare HMO | Admitting: Adult Health

## 2016-11-09 DIAGNOSIS — J439 Emphysema, unspecified: Secondary | ICD-10-CM | POA: Diagnosis not present

## 2016-11-09 DIAGNOSIS — G4733 Obstructive sleep apnea (adult) (pediatric): Secondary | ICD-10-CM | POA: Diagnosis not present

## 2016-11-09 DIAGNOSIS — Z23 Encounter for immunization: Secondary | ICD-10-CM

## 2016-11-09 NOTE — Patient Instructions (Addendum)
Can remain off BREO and Spiriva for now .  Flu shot today .  Restart CPAP At bedtime   Goal is to wear at least 4-6 hrs each night.  Work on weight loss.  ONO on CPAP on room air in 4 weeks .  Follow up with  Dr. Chase Caller or [place] NP  2 months  Please contact office for sooner follow up if symptoms do not improve or worsen or seek emergency care

## 2016-11-09 NOTE — Progress Notes (Signed)
@Patient  ID: Emily Abbott, female    DOB: 04-16-1940, 76 y.o.   MRN: [id number]  Chief Complaint  Patient presents with  . Follow-up    CT     Referring provider: Harlan Stains, MD  HPI: 76 year old female followed for COPD, emphysema, lung nodule, sleep apnea  TEST  PSG 02/02/15 >> AHI 31.2, SaO2 low 79%. PFT 11/11/2014 showed FEV1 at 91%, ratio 73, FVC 93%, DLCO 48%  11/09/2016 Follow up : COPD , OSA , Lung nodule  Returns for a follow-up. She was last seen April 2017. Patient has known COPD. Patient says that her breathing has been doing okay without flare of cough or wheezing. She has been off of BREO and Spiriva for several months. She has not noticed a change in her breathing. Does feel like to her chronic hoarseness. This is maybe a little bit better since being off the inhalers. She has not noticed a significant change in her shortness of breath. She tries to remain active with yard work. She does have to rest because she gets short of breath if she tries to go up an incline lift heavy objects or walking for prolonged period of time. She does feel like the heat makes her breathing worse. Previous PFTs in 2016 showed no significant restriction or airflow obstruction. There was a severe diffusing defect.. Patient would like to get her flu shot today  Patient has history of lung nodule. That has been followed on serial, CT chest. CT chest done yesterday on September 20 showed stable pulmonary nodules since September 2016 with no substantial change.  Patient has known history of severe sleep apnea. She was recommended to begin C Pap. Patient says that she got her C Pap machine but she never started on it. She did not understand how to use it. We discussed how to use her C Pap machine. And advised her to get started on it. Discussed potential complications of untreated sleep apnea.   Allergies  Allergen Reactions  . Sulfonamide Derivatives     Unknown    Immunization  History  Administered Date(s) Administered  . Influenza Split 01/06/2015  . Pneumococcal-Unspecified 02/19/2010    Past Medical History:  Diagnosis Date  . Anemia   . Anxiety   . CAD (coronary artery disease)   . COPD (chronic obstructive pulmonary disease) ([place])   . Diabetes mellitus    type II  . GERD (gastroesophageal reflux disease)   . HTN (hypertension)    essential  . Hyperlipidemia, mixed   . Mitral valve disorder   . Osteoarthritis   . Panic attack   . Paroxysmal atrial fibrillation ([facility])   . Pulmonary embolism ([facility])     Tobacco History: History  Smoking Status  . Former Smoker  . Packs/day: 1.00  . Years: 50.00  . Types: Cigarettes  . Quit date: 02/19/2001  Smokeless Tobacco  . Never Used   Counseling given: Not Answered   Outpatient Encounter Prescriptions as of 11/09/2016  Medication Sig  . albuterol (PROAIR HFA) 108 (90 Base) MCG/ACT inhaler Inhale 1-2 puffs into the lungs every 6 (six) hours as needed for wheezing or shortness of breath.  . ALPRAZolam (XANAX) 0.5 MG tablet Take 0.5 mg by mouth 2 (two) times daily as needed.   Marland Kitchen amLODipine (NORVASC) 5 MG tablet Take 5 mg by mouth daily.  Marland Kitchen aspirin 325 MG tablet Take 325 mg by mouth daily.    Marland Kitchen atorvastatin (LIPITOR) 20 MG tablet Take 1 tablet by  mouth daily.  . carvedilol (COREG) 6.25 MG tablet Take 6.25 mg by mouth 2 (two) times daily with a meal.  . diphenhydrAMINE (BENADRYL) 25 mg capsule Take 25 mg by mouth at bedtime.    . dofetilide (TIKOSYN) 250 MCG capsule TAKE ONE CAPSULE BY MOUTH TWICE A DAY  . ezetimibe (ZETIA) 10 MG tablet Take 10 mg by mouth daily.  . ferrous sulfate 325 (65 FE) MG tablet Take 1 tablet by mouth daily. Reported on 07/12/2015  . furosemide (LASIX) 20 MG tablet TAKE 2 TABLETS BY MOUTH EVERY DAY  . glucosamine-chondroitin 500-400 MG tablet Take 1 tablet by mouth daily. Reported on 07/12/2015  . HYDROcodone-acetaminophen (VICODIN) 5-500 MG per tablet Take 1 tablet by mouth as  directed.   . linagliptin (TRADJENTA) 5 MG TABS tablet Take 5 mg by mouth daily.  . magnesium gluconate (MAGONATE) 500 MG tablet Take 500 mg by mouth daily. Reported on 07/12/2015  . omeprazole (PRILOSEC) 40 MG capsule Take 40 mg by mouth daily.    Marland Kitchen PARoxetine (PAXIL) 30 MG tablet Take 30 mg by mouth daily.    Marland Kitchen tiZANidine (ZANAFLEX) 2 MG tablet Take by mouth every 6 (six) hours as needed for muscle spasms.  . Vitamin D, Ergocalciferol, (DRISDOL) 50000 units CAPS capsule Take 50,000 Units by mouth every 7 (seven) days.  . vitamin E 200 UNIT capsule Take 200 Units by mouth daily. Reported on 07/12/2015  . warfarin (COUMADIN) 5 MG tablet Use as directed by Alta Corning, MD   . zolpidem (AMBIEN) 10 MG tablet Take 5 mg by mouth at bedtime as needed for sleep.   Marland Kitchen BREO ELLIPTA 100-25 MCG/INH AEPB INHALE 1 PUFF DAILY (Patient not taking: Reported on 11/09/2016)  . tiotropium (SPIRIVA) 18 MCG inhalation capsule Place 1 capsule (18 mcg total) into inhaler and inhale daily. (Patient not taking: Reported on 11/09/2016)  . [DISCONTINUED] metFORMIN (GLUCOPHAGE) 850 MG tablet Take 850 mg by mouth 2 (two) times daily with a meal.     No facility-administered encounter medications on file as of 11/09/2016.      Review of Systems  Constitutional:   No  weight loss, night sweats,  Fevers, chills,  +fatigue, or  lassitude.  HEENT:   No headaches,  Difficulty swallowing,  Tooth/dental problems, or  Sore throat,                No sneezing, itching, ear ache, nasal congestion, post nasal drip,   CV:  No chest pain,  Orthopnea, PND, swelling in lower extremities, anasarca, dizziness, palpitations, syncope.   GI  No heartburn, indigestion, abdominal pain, nausea, vomiting, diarrhea, change in bowel habits, loss of appetite, bloody stools.   Resp:   No chest wall deformity  Skin: no rash or lesions.  GU: no dysuria, change in color of urine, no urgency or frequency.  No flank pain, no hematuria   MS:  No  joint pain or swelling.  No decreased range of motion.  No back pain.    Physical Exam  BP 118/68 (BP Location: Left Arm, Cuff Size: Normal)   Pulse 64   Ht 5' 6.5" (1.689 m)   Wt 203 lb 3.2 oz (92.2 kg)   SpO2 94%   BMI 32.31 kg/m   GEN: A/Ox3; pleasant , NAD, obese    HEENT:  [place]/AT,  EACs-clear, TMs-wnl, NOSE-clear, THROAT-clear, no lesions, no postnasal drip or exudate noted.   NECK:  Supple w/ fair ROM; no JVD; normal carotid impulses w/o bruits; no  thyromegaly or nodules palpated; no lymphadenopathy.    RESP  Clear  P & A; w/o, wheezes/ rales/ or rhonchi. no accessory muscle use, no dullness to percussion  CARD:  RRR, no m/r/g, tr  peripheral edema, pulses intact, no cyanosis or clubbing.  GI:   Soft & nt; nml bowel sounds; no organomegaly or masses detected.   Musco: Warm bil, no deformities or joint swelling noted.   Neuro: alert, no focal deficits noted.    Skin: Warm, no lesions or rashes    Lab Results:  CBC  BMET  BNP No results found for: BNP  ProBNP No results found for: PROBNP  Imaging: Ct Chest Wo Contrast  Result Date: 11/08/2016 CLINICAL DATA:  Lung nodule follow-up EXAM: CT CHEST WITHOUT CONTRAST TECHNIQUE: Multidetector CT imaging of the chest was performed following the standard protocol without IV contrast. COMPARISON:  05/08/2016 FINDINGS: Cardiovascular: The heart size is normal. No pericardial effusion. Coronary artery calcification is evident. Patient is status post CABG. Atherosclerotic calcification is noted in the wall of the thoracic aorta. Mediastinum/Nodes: Small scattered mediastinal lymph nodes are stable. No evidence for gross hilar lymphadenopathy although assessment is limited by the lack of intravenous contrast on today's study. The esophagus has normal imaging features. There is no axillary lymphadenopathy. Lungs/Pleura: Anterior right upper lobe pulmonary nodule is stable at 8 x 10 mm (image 85 series 3). 6 mm peripheral right  middle lobe nodule seen on image 110 is also unchanged. 5 mm subpleural nodule left lower lobe on image 104 is unchanged. 8 mm left lower lobe nodule on image 89 is stable. Numerous other tiny bilateral pulmonary nodules measuring less than 5 mm are evident and appear unchanged. No definite new or progressive pulmonary nodule or mass on today's study. Upper Abdomen: 2.1 cm right adrenal nodule is unchanged and has average attenuation compatible with adenoma. Musculoskeletal: Bone windows reveal no worrisome lytic or sclerotic osseous lesions. IMPRESSION: 1. Bilateral pulmonary nodules stable since prior study of 05/08/2016. Comparing to an older exam from 11/15/2014, there has been no substantial change in the dominant bilateral nodules when taking into account the thicker slice collimation/volume averaging on that 2016 exam. As such, imaging features are felt to be most compatible benign disease. 2. Stable right benign adrenal adenoma. 3. Coronary artery and Aortic Atherosclerois (ICD10-170.0) Electronically Signed   By: Misty Stanley M.D.   On: 11/08/2016 14:52   US Soft Tissue Head And Neck  Result Date: 10/16/2016 CLINICAL DATA:  Hyperparathyroid.  Negative scintigraphy. EXAM: THYROID ULTRASOUND TECHNIQUE: Ultrasound examination of the thyroid gland and adjacent soft tissues was performed. COMPARISON:  Scintigraphy 09/28/2016 and previous FINDINGS: Parenchymal Echotexture: Mildly heterogenous Isthmus: 0.3 cm thickness Right lobe: 4.5 x 2.2 x 1.9 cm Left lobe: 3.7 x 1.2 x 1.9 cm _________________________________________________________ Estimated total number of nodules >/= 1 cm: 1 Number of spongiform nodules >/=  2 cm not described below (TR1): 0 Number of mixed cystic and solid nodules >/= 1.5 cm not described below (Pace): 0 _________________________________________________________ Nodule # 1: Location: Left; Inferior Maximum size: 2 cm; Other 2 dimensions: 1.6 x 1.3 cm Composition: mixed cystic and solid  (1) Echogenicity: isoechoic (1) Shape: not taller-than-wide (0) Margins: ill-defined (0) Echogenic foci: none (0) ACR TI-RADS total points: 2. ACR TI-RADS risk category: TR2 (2 points). ACR TI-RADS recommendations: This nodule does NOT meet TI-RADS criteria for biopsy or dedicated follow-up. _________________________________________________________ 0.8 x 0.7 cm hypoechoic nodule without calcifications, posterior to the mid left lobe. At least 4  small subcentimeter isoechoic or cystic nodules in the right lobe. IMPRESSION: 1. Normal-sized thyroid with small bilateral nodules. None meets criteria for biopsy or dedicated imaging follow-up. The above is in keeping with the ACR TI-RADS recommendations - J Am Coll Radiol 2017;14:587-595. Electronically Signed   By: Lucrezia Europe M.D.   On: 10/16/2016 14:22   Dg Hand Complete Left  Result Date: 10/17/2016 CLINICAL DATA:  Fall with injury to the hands, hematoma and pain EXAM: LEFT HAND - COMPLETE 3+ VIEW COMPARISON:  05/30/2016 FINDINGS: No acute displaced fracture or malalignment. Moderate degenerative changes at the first [facility] joint. Diffuse degenerative changes at the DIP and PIP joints. No radiopaque foreign body. IMPRESSION: 1. No acute osseous abnormality 2. Degenerative changes of the digits and first [facility] joint Electronically Signed   By: Donavan Foil M.D.   On: 10/17/2016 19:43     Assessment & Plan:   COPD (chronic obstructive pulmonary disease) ([place]) COPD /Emphysema with no significant obstruction or restriction on PFT  She has not had a flare off BREO or Spirva .  Hold for now .   Plan  Patient Instructions  Can remain off BREO and Spiriva for now .  Flu shot today .  Restart CPAP At bedtime   Goal is to wear at least 4-6 hrs each night.  Work on weight loss.  ONO on CPAP on room air in 4 weeks .  Follow up with  Dr. Chase Caller or Jinan Biggins NP  2 months  Please contact office for sooner follow up if symptoms do not improve or worsen or seek  emergency care      Obstructive sleep apnea Severe OSA -needs to begin therapy  Check ONO on CPAP   Plan  Patient Instructions  Can remain off BREO and Spiriva for now .  Flu shot today .  Restart CPAP At bedtime   Goal is to wear at least 4-6 hrs each night.  Work on weight loss.  ONO on CPAP on room air in 4 weeks .  Follow up with  Dr. Chase Caller or Capria Cartaya NP  2 months  Please contact office for sooner follow up if symptoms do not improve or worsen or seek emergency care         Rexene Edison, NP 11/09/2016

## 2016-11-09 NOTE — Assessment & Plan Note (Signed)
Severe OSA -needs to begin therapy  Check ONO on CPAP   Plan  Patient Instructions  Can remain off BREO and Spiriva for now .  Flu shot today .  Restart CPAP At bedtime   Goal is to wear at least 4-6 hrs each night.  Work on weight loss.  ONO on CPAP on room air in 4 weeks .  Follow up with  Dr. Chase Caller or Angus Amini NP  2 months  Please contact office for sooner follow up if symptoms do not improve or worsen or seek emergency care

## 2016-11-09 NOTE — Assessment & Plan Note (Signed)
COPD /Emphysema with no significant obstruction or restriction on PFT  She has not had a flare off BREO or Spirva .  Hold for now .   Plan  Patient Instructions  Can remain off BREO and Spiriva for now .  Flu shot today .  Restart CPAP At bedtime   Goal is to wear at least 4-6 hrs each night.  Work on weight loss.  ONO on CPAP on room air in 4 weeks .  Follow up with  Dr. Chase Caller or Kalayla Shadden NP  2 months  Please contact office for sooner follow up if symptoms do not improve or worsen or seek emergency care

## 2016-11-09 NOTE — Addendum Note (Signed)
Addended by: Parke Poisson E on: 11/09/2016 05:18 PM   Modules accepted: Orders

## 2016-11-12 ENCOUNTER — Encounter (INDEPENDENT_AMBULATORY_CARE_PROVIDER_SITE_OTHER): Payer: Self-pay

## 2016-11-12 ENCOUNTER — Ambulatory Visit
Admission: RE | Admit: 2016-11-12 | Discharge: 2016-11-12 | Disposition: A | Discharge status: Home / Self Care | Payer: Medicare HMO | Source: Ambulatory Visit | Attending: Surgery | Admitting: Surgery

## 2016-11-12 DIAGNOSIS — E213 Hyperparathyroidism, unspecified: Secondary | ICD-10-CM | POA: Diagnosis not present

## 2016-11-12 DIAGNOSIS — Z7901 Long term (current) use of anticoagulants: Secondary | ICD-10-CM | POA: Diagnosis not present

## 2016-11-12 MED ORDER — IOPAMIDOL (ISOVUE-300) INJECTION 61%
75.0000 mL | Freq: Once | INTRAVENOUS | Status: AC | PRN
  Administered 2016-11-12: 75 mL via INTRAVENOUS

## 2016-11-13 DIAGNOSIS — G4733 Obstructive sleep apnea (adult) (pediatric): Secondary | ICD-10-CM | POA: Diagnosis not present

## 2016-11-13 IMAGING — CT CT NECK SOFT TISSUE WO/W CM
3 of 10 series · 9 of 27 positions shown, 10 images · IV contrast (75CC ISOVUE 300)
Comparison: None.

CLINICAL DATA: Hyperparathyroidism.  Hoarseness.

Creatinine was obtained on site at [HOSPITAL] at [REDACTED].Results: Creatinine 1.3 mg/dL.
EXAM:
CT NECK WITH AND WITHOUT CONTRAST
TECHNIQUE: Multidetector CT imaging of the neck was performed without and with
intravenous contrast.
CONTRAST:  75mL U38F0M-SDD IOPAMIDOL (U38F0M-SDD) INJECTION 61%

[Series 4: arterial thin · axial · arterial · 0.42mm/px · z∈[-13,+60]mm · 2 of 352 slices shown, 3 images]
[im 118/352  soft-tissue]
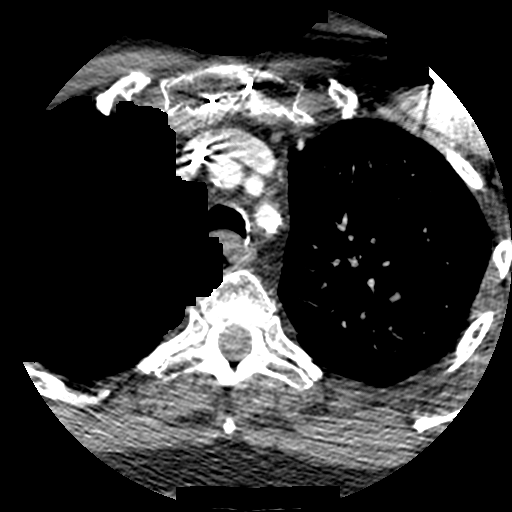
[im 118/352  bone]
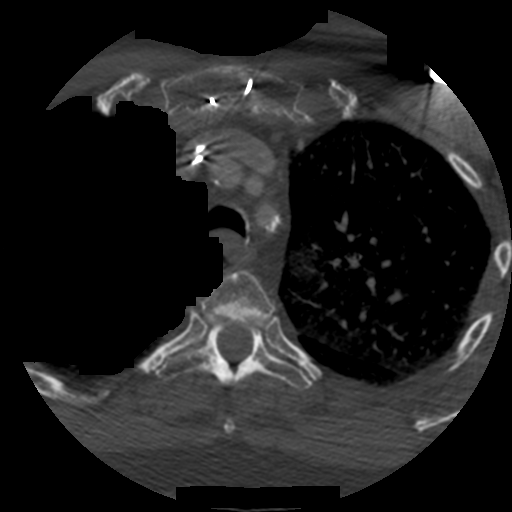
[im 235/352  bone]
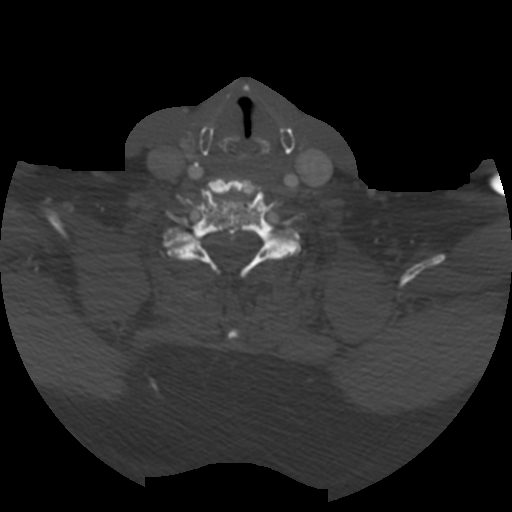

[Series 5: venous thin · axial · portal-venous · 0.42mm/px · z∈[-14,+59]mm · 2 of 352 slices shown]
[im 118/352  bone]
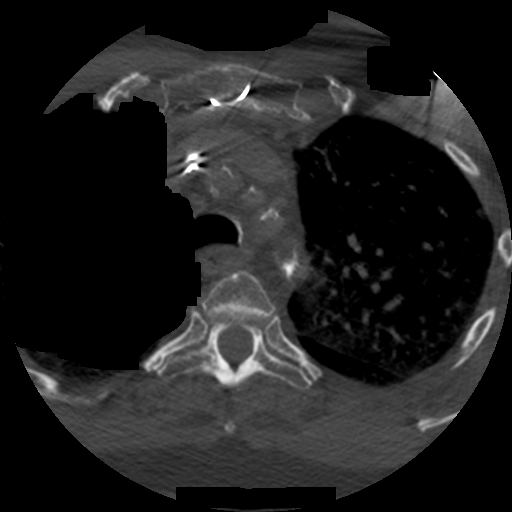
[im 235/352  bone]
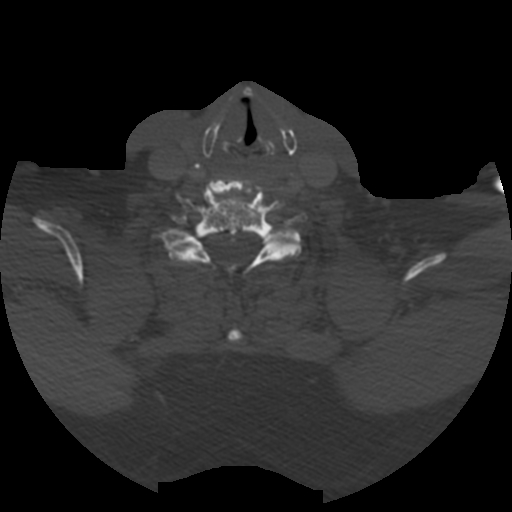

[Series 601: cor soft tissue neck · coronal · 0.43mm/px · 5 of 108 slices shown]
[im 18/108  bone]
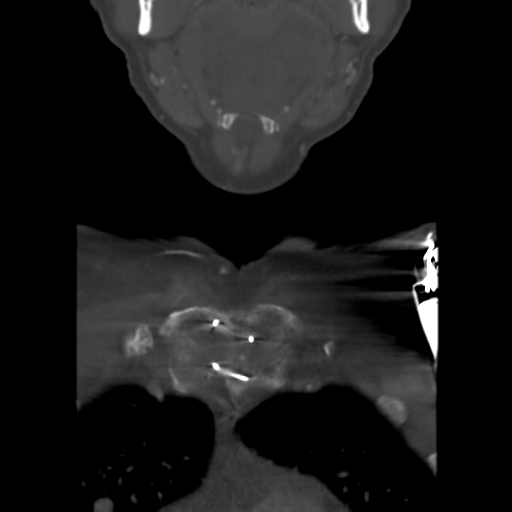
[im 36/108  bone]
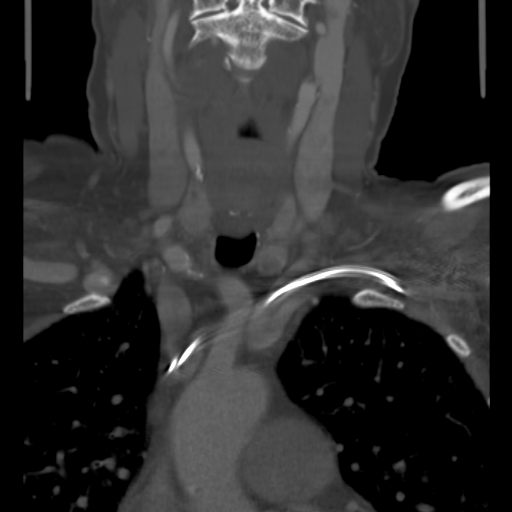
[im 54/108  bone]
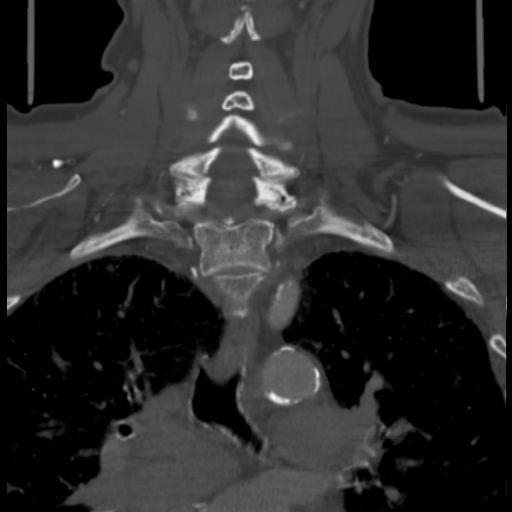
[im 72/108  bone]
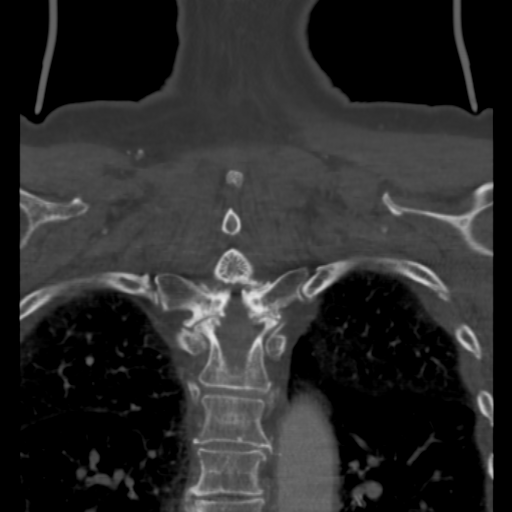
[im 90/108  bone]
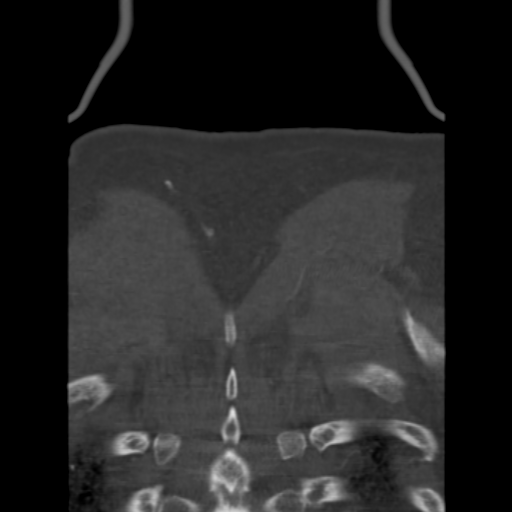

[9 of 27 positions shown; findings below may reference images not displayed]

FINDINGS: Pharynx and larynx: No focal mucosal or submucosal lesion is evident
through the imaged area. Vocal cords are midline and symmetric.

Salivary glands: The visualized submandibular glands are normal.

Thyroid: The thyroid is heterogeneous. A dominant heterogeneous
nodule within the lower pole of the left lobe of the thyroid
measures 9 x 14 x 13 mm. Small right-sided nodules are present.

An ovoid lesion posterior to the left lobe of the thyroid
demonstrates washout of contrast on delayed images. The lesion
measures 8.5 x 5.0 x 5.5 mm. There is a posterior extension from the
right lobe of the thyroid as well. This follows the density of
thyroid more closely.

Lymph nodes: No significant cervical adenopathy is present.

Vascular: Atherosclerotic calcifications are present at the carotid
bifurcations bilaterally. There is no significant stenosis relative
to the more distal vessels.

Limited intracranial: Not imaged for the parathyroid protocol.

Visualized orbits: Not imaged for the parathyroid protocol.

Mastoids and visualized paranasal sinuses: Not imaged

Skeleton: Vertebral body heights and alignment are maintained.
Multilevel endplate degenerative changes are noted from C3-4 through
C6-7. Osseous foraminal narrowing is greatest at C5-6 on the right
and C6-7 on the left. The patient is status post median sternotomy.
No focal lytic or blastic lesions are present.

Upper chest: The lung apices are clear.

Other: Incidental note is made of pacemaker.
IMPRESSION: 1. Suspected left upper pole parathyroid adenoma measuring 8.5 x
x 5.5 mm.
2. Heterogeneous thyroid with a dominant lesion at the lower pole of
the left lobe measures 9 x 14 x 13 mm. Consider further evaluation
with thyroid ultrasound. If patient is clinically hyperthyroid,
consider nuclear medicine thyroid uptake and scan.
3. No significant cervical adenopathy.
4. Multilevel cervical spondylosis as described.
5. Median sternotomy.

## 2016-11-20 DIAGNOSIS — E785 Hyperlipidemia, unspecified: Secondary | ICD-10-CM | POA: Diagnosis not present

## 2016-11-20 DIAGNOSIS — E1121 Type 2 diabetes mellitus with diabetic nephropathy: Secondary | ICD-10-CM | POA: Diagnosis not present

## 2016-11-20 DIAGNOSIS — G894 Chronic pain syndrome: Secondary | ICD-10-CM | POA: Diagnosis not present

## 2016-11-20 DIAGNOSIS — I130 Hypertensive heart and chronic kidney disease with heart failure and stage 1 through stage 4 chronic kidney disease, or unspecified chronic kidney disease: Secondary | ICD-10-CM | POA: Diagnosis not present

## 2016-11-20 DIAGNOSIS — I480 Paroxysmal atrial fibrillation: Secondary | ICD-10-CM | POA: Diagnosis not present

## 2016-11-20 DIAGNOSIS — R490 Dysphonia: Secondary | ICD-10-CM | POA: Diagnosis not present

## 2016-11-20 DIAGNOSIS — E113299 Type 2 diabetes mellitus with mild nonproliferative diabetic retinopathy without macular edema, unspecified eye: Secondary | ICD-10-CM | POA: Diagnosis not present

## 2016-11-20 DIAGNOSIS — R296 Repeated falls: Secondary | ICD-10-CM | POA: Diagnosis not present

## 2016-11-20 DIAGNOSIS — I429 Cardiomyopathy, unspecified: Secondary | ICD-10-CM | POA: Diagnosis not present

## 2016-11-20 DIAGNOSIS — N183 Chronic kidney disease, stage 3 (moderate): Secondary | ICD-10-CM | POA: Diagnosis not present

## 2016-11-23 ENCOUNTER — Telehealth: Payer: Self-pay

## 2016-11-23 NOTE — Telephone Encounter (Signed)
LMOVM requesting that pt send manual transmission 

## 2016-11-24 DIAGNOSIS — J438 Other emphysema: Secondary | ICD-10-CM | POA: Diagnosis not present

## 2016-11-27 NOTE — Progress Notes (Signed)
No ICM remote transmission received for 11/23/2016 and next ICM transmission scheduled for 12/24/2016.

## 2016-11-28 ENCOUNTER — Telehealth: Payer: Self-pay | Admitting: Cardiology

## 2016-11-28 DIAGNOSIS — E213 Hyperparathyroidism, unspecified: Secondary | ICD-10-CM | POA: Diagnosis not present

## 2016-11-28 NOTE — Telephone Encounter (Signed)
LMOVM requesting that pt send manual transmission b/c home monitor has not updated in at least 7 days.    

## 2016-12-06 ENCOUNTER — Telehealth: Payer: Self-pay | Admitting: Cardiology

## 2016-12-06 NOTE — Telephone Encounter (Signed)
Spoke w/ pt and requested that she send a manual transmission b/c her home monitor has not updated in at least 7 days.   

## 2016-12-10 ENCOUNTER — Ambulatory Visit: Payer: Medicare HMO | Attending: Family Medicine | Admitting: Physical Therapy

## 2016-12-14 ENCOUNTER — Ambulatory Visit: Payer: Medicare HMO | Admitting: Podiatry

## 2016-12-14 DIAGNOSIS — J438 Other emphysema: Secondary | ICD-10-CM | POA: Diagnosis not present

## 2016-12-14 DIAGNOSIS — G4733 Obstructive sleep apnea (adult) (pediatric): Secondary | ICD-10-CM | POA: Diagnosis not present

## 2016-12-18 ENCOUNTER — Ambulatory Visit (INDEPENDENT_AMBULATORY_CARE_PROVIDER_SITE_OTHER): Payer: Medicare HMO | Admitting: Podiatry

## 2016-12-18 DIAGNOSIS — Q828 Other specified congenital malformations of skin: Secondary | ICD-10-CM | POA: Diagnosis not present

## 2016-12-18 DIAGNOSIS — E1151 Type 2 diabetes mellitus with diabetic peripheral angiopathy without gangrene: Secondary | ICD-10-CM | POA: Diagnosis not present

## 2016-12-18 DIAGNOSIS — Z7901 Long term (current) use of anticoagulants: Secondary | ICD-10-CM | POA: Diagnosis not present

## 2016-12-18 NOTE — Progress Notes (Signed)
Patient ID: Emily Abbott, female   DOB: 1940-03-01, 76 y.o.   MRN: [id number] Complaint:  Visit Type: Patient returns to my office for continued preventative foot care services. Complaint: Patient states" my callus on my left foot is painful to walk. Patient has been diagnosed with DM with no foot complications. The patient presents for preventative foot care services. No changes to ROS  Podiatric Exam: Vascular: dorsalis pedis and posterior tibial pulses are palpable bilateral. Capillary return is immediate. Temperature gradient is WNL. Skin turgor WNL  Sensorium: Normal Semmes Weinstein monofilament test. Normal tactile sensation bilaterally. Nail Exam: Pt has thick disfigured discolored nails with subungual debris noted bilateral entire nail hallux through fifth toenails Ulcer Exam: There is no evidence of ulcer or pre-ulcerative changes or infection. Orthopedic Exam: Muscle tone and strength are WNL. No limitations in general ROM. No crepitus or effusions noted. Foot type and digits show no abnormalities. Bony prominences are unremarkable. Skin: Porokeratosis sub 5th left foot. No infection or ulcers  Diagnosis:  , Porokeratosis Sub 5th left foot.  Treatment & Plan Procedures and Treatment: Consent by patient was obtained for treatment procedures. The patient understood the discussion of treatment and procedures well. All questions were answered thoroughly reviewed.  No ulceration, no infection noted. Debride porokeratosis. Return Visit-Office Procedure: Patient instructed to return to the office for a follow up visit 3 months for continued evaluation and treatment.    Gardiner Barefoot DPM

## 2016-12-19 DIAGNOSIS — S0012XA Contusion of left eyelid and periocular area, initial encounter: Secondary | ICD-10-CM | POA: Diagnosis not present

## 2016-12-24 ENCOUNTER — Ambulatory Visit (INDEPENDENT_AMBULATORY_CARE_PROVIDER_SITE_OTHER): Payer: Medicare HMO | Admitting: *Deleted

## 2016-12-24 DIAGNOSIS — I5022 Chronic systolic (congestive) heart failure: Secondary | ICD-10-CM

## 2016-12-24 DIAGNOSIS — I428 Other cardiomyopathies: Secondary | ICD-10-CM

## 2016-12-24 DIAGNOSIS — Z95810 Presence of automatic (implantable) cardiac defibrillator: Secondary | ICD-10-CM | POA: Diagnosis not present

## 2016-12-24 NOTE — Progress Notes (Signed)
Remote ICD transmission.   

## 2016-12-24 NOTE — Progress Notes (Signed)
EPIC Encounter for ICM Monitoring  Patient Name: MINYON BILLITER is a 76 y.o. female Date: 12/24/2016 Primary Care Physican: Harlan Stains, MD Primary Cardiologist:Taylor Electrophysiologist: Lovena Le Dry Weight: unknown Bi-V Pacing: >99% Battery Longevity: 7.3 months  AT/AF Burden: 4.5%      Heart Failure questions reviewed, pt asymptomatic.  Patient reported she has a growth on her parathyroid and may need surgery.  She is getting a 2nd opinion.    Thoracic impedance normal.  Patient reported feeling like she was out of NSR after her brother passed away on 09-Dec-2022.   Prescribed dosage: Furosemide 20 mg 2 tablets (40 mg total) daily.    LABS from Dr Orest Dikes office 06/20/2015 Creatinine 1.12, BUN 22, Potassium 4.5, Sodium 141, GFR 47.  Recommendations: No changes.   Encouraged to call for fluid symptoms.  Follow-up plan: ICM clinic phone appointment on 01/24/2017.    Copy of ICM check sent to Dr. Lovena Le.   3 month ICM trend: 12/24/2016   AT/AF                1 Year ICM trend:      [name], RN 12/24/2016 3:57 PM

## 2016-12-25 DIAGNOSIS — J438 Other emphysema: Secondary | ICD-10-CM | POA: Diagnosis not present

## 2016-12-26 ENCOUNTER — Encounter: Payer: Self-pay | Admitting: Cardiology

## 2016-12-31 DIAGNOSIS — R69 Illness, unspecified: Secondary | ICD-10-CM | POA: Diagnosis not present

## 2017-01-01 DIAGNOSIS — R69 Illness, unspecified: Secondary | ICD-10-CM | POA: Diagnosis not present

## 2017-01-08 ENCOUNTER — Telehealth: Payer: Self-pay | Admitting: Cardiology

## 2017-01-08 LAB — CUP PACEART REMOTE DEVICE CHECK
Brady Statistic AP VP Percent: 39 %
Brady Statistic AP VS Percent: 1 %
Brady Statistic AS VP Percent: 61 %
Brady Statistic AS VS Percent: 1 %
HIGH POWER IMPEDANCE MEASURED VALUE: 61 Ohm
HIGH POWER IMPEDANCE MEASURED VALUE: 61 Ohm
Implantable Lead Location: 753859
Implantable Lead Location: 753860
Implantable Lead Model: 7122
Implantable Pulse Generator Implant Date: 20110127
Lead Channel Impedance Value: 1000 Ohm
Lead Channel Impedance Value: 390 Ohm
Lead Channel Impedance Value: 440 Ohm
Lead Channel Pacing Threshold Amplitude: 0.625 V
Lead Channel Pacing Threshold Amplitude: 0.875 V
Lead Channel Pacing Threshold Pulse Width: 0.5 ms
Lead Channel Sensing Intrinsic Amplitude: 12 mV
Lead Channel Sensing Intrinsic Amplitude: 2.1 mV
Lead Channel Setting Pacing Amplitude: 2 V
Lead Channel Setting Pacing Amplitude: 2 V
Lead Channel Setting Pacing Amplitude: 2 V
Lead Channel Setting Pacing Pulse Width: 0.5 ms
Lead Channel Setting Sensing Sensitivity: 0.5 mV
MDC IDC LEAD IMPLANT DT: 20110127
MDC IDC LEAD IMPLANT DT: 20110127
MDC IDC LEAD IMPLANT DT: 20110127
MDC IDC LEAD LOCATION: 753858
MDC IDC MSMT BATTERY REMAINING LONGEVITY: 7 mo
MDC IDC MSMT BATTERY REMAINING PERCENTAGE: 7 %
MDC IDC MSMT BATTERY VOLTAGE: 2.65 V
MDC IDC MSMT LEADCHNL LV PACING THRESHOLD PULSEWIDTH: 0.5 ms
MDC IDC MSMT LEADCHNL RA PACING THRESHOLD AMPLITUDE: 1 V
MDC IDC MSMT LEADCHNL RV PACING THRESHOLD PULSEWIDTH: 0.5 ms
MDC IDC PG SERIAL: 752248
MDC IDC SESS DTM: 20181105180208
MDC IDC SET LEADCHNL LV PACING PULSEWIDTH: 0.5 ms
MDC IDC STAT BRADY RA PERCENT PACED: 35 %

## 2017-01-08 NOTE — Telephone Encounter (Signed)
LMOVM requesting that pt send manual transmission b/c home monitor has not updated in at least 7 days.    

## 2017-01-14 ENCOUNTER — Ambulatory Visit: Payer: Medicare HMO | Admitting: Adult Health

## 2017-01-21 DIAGNOSIS — E210 Primary hyperparathyroidism: Secondary | ICD-10-CM | POA: Diagnosis not present

## 2017-01-21 DIAGNOSIS — Z7901 Long term (current) use of anticoagulants: Secondary | ICD-10-CM | POA: Diagnosis not present

## 2017-01-24 ENCOUNTER — Telehealth: Payer: Self-pay | Admitting: Cardiology

## 2017-01-24 DIAGNOSIS — J438 Other emphysema: Secondary | ICD-10-CM | POA: Diagnosis not present

## 2017-01-24 DIAGNOSIS — E119 Type 2 diabetes mellitus without complications: Secondary | ICD-10-CM | POA: Diagnosis not present

## 2017-01-24 DIAGNOSIS — H04123 Dry eye syndrome of bilateral lacrimal glands: Secondary | ICD-10-CM | POA: Diagnosis not present

## 2017-01-24 DIAGNOSIS — H43813 Vitreous degeneration, bilateral: Secondary | ICD-10-CM | POA: Diagnosis not present

## 2017-01-24 NOTE — Telephone Encounter (Signed)
LMOVM reminding pt to send remote transmission.   

## 2017-01-25 NOTE — Progress Notes (Signed)
No ICM remote transmission received for 01/24/2017 and next ICM transmission scheduled for 03/01/2017.

## 2017-02-07 DIAGNOSIS — I480 Paroxysmal atrial fibrillation: Secondary | ICD-10-CM | POA: Diagnosis not present

## 2017-02-07 DIAGNOSIS — Z7901 Long term (current) use of anticoagulants: Secondary | ICD-10-CM | POA: Diagnosis not present

## 2017-02-24 DIAGNOSIS — J438 Other emphysema: Secondary | ICD-10-CM | POA: Diagnosis not present

## 2017-02-25 ENCOUNTER — Telehealth: Payer: Self-pay | Admitting: *Deleted

## 2017-02-25 NOTE — Telephone Encounter (Addendum)
   Primary Cardiologist:Gregg Lovena Le, MD  Chart reviewed as part of pre-operative protocol coverage. She has history of CAD, MI, chronic systolic CHF, BiV ICD, peripheral edema, DM, GERD, HTN, HLD, paroxysmal atrial fib, COPD, pulmonary embolism. Chart outlines normal coronaries in 2009, do not see f/u cath reports since that time. 2D Echo in 2010 showed EF 35%. Subsequent echoes reveal normal LV function. She continues both on full dose aspirin and warfarin. The warfarin is not monitored through our office. She is on high risk medications (Tikosyn). Last labs on file are through primary care in 06/2016 with potassium of 3.6. Was able to download [facility] report which showed a potassium was 4.7 in 11/2016. No recent Mg level on file. At last OV in 08/2016 her blood pressure was elevated and she required adjustment of her Lasix.  She is followed by our remote ICM clinic but did not submit a transmission for December thus volume status unknown.  Given her medical complexity and time since last visit, she will require a follow-up visit in order to better assess preoperative cardiovascular risk to make sure she is well optimized prior to proceeding. Parathyroid surgery may also imply electrolyte changes with calcium which will need to be monitored closely with regard to her Tikosyn therapy and QT.  Pre-op covering staff: - Please schedule appointment and call patient to inform them. Preference is with EP app but if no availability, general cardiology APP is OK. - Please contact requesting surgeon's office via preferred method (i.e, phone, fax) to inform them of need for appointment prior to surgery.   Charlie Pitter, PA-C  02/25/2017, 3:37 PM

## 2017-02-25 NOTE — Telephone Encounter (Signed)
   [facility] Pre-operative Risk Assessment    Request for surgical clearance:  1. What type of surgery is being performed? Parathyroid surgery   2. When is this surgery scheduled? Not scheduled. Will require written clearance in order to get a surgery date   3. Are there any medications that need to be held prior to surgery and how long?Warfarin-will most likely require Lovenox bridge management. Need instructions as to how the patient should hold medication preoperatively.   4. Practice name and name of physician performing surgery? [facility]-  Dr. Harlow Asa  5. What is your office phone and fax number? [phone]. Fax-[phone] (attention: Benjiman Core)   6. Anesthesia type (None, local, MAC, general) ? general   Leodis Liverpool 02/25/2017, 3:02 PM  _________________________________________________________________   (provider comments below)

## 2017-02-25 NOTE — Telephone Encounter (Signed)
Spoke with pt re: cardiac clearance and let her know that based upon her medical history that she will need to be seen in the office before she can be cleared for surgery. Pt has been scheduled for 03/19/17 @ 1:15 to see Donnajean Lopes, PA-C. Pt thanked me for the call.  Also, placed call to [facility], spoke with Benjiman Core, Dr. Tera Helper' Asst and she thanked me for the call.

## 2017-02-26 DIAGNOSIS — E1121 Type 2 diabetes mellitus with diabetic nephropathy: Secondary | ICD-10-CM | POA: Diagnosis not present

## 2017-02-26 DIAGNOSIS — I429 Cardiomyopathy, unspecified: Secondary | ICD-10-CM | POA: Diagnosis not present

## 2017-02-26 DIAGNOSIS — I130 Hypertensive heart and chronic kidney disease with heart failure and stage 1 through stage 4 chronic kidney disease, or unspecified chronic kidney disease: Secondary | ICD-10-CM | POA: Diagnosis not present

## 2017-02-26 DIAGNOSIS — E113299 Type 2 diabetes mellitus with mild nonproliferative diabetic retinopathy without macular edema, unspecified eye: Secondary | ICD-10-CM | POA: Diagnosis not present

## 2017-02-26 DIAGNOSIS — E213 Hyperparathyroidism, unspecified: Secondary | ICD-10-CM | POA: Diagnosis not present

## 2017-02-26 DIAGNOSIS — N183 Chronic kidney disease, stage 3 (moderate): Secondary | ICD-10-CM | POA: Diagnosis not present

## 2017-02-26 DIAGNOSIS — D72829 Elevated white blood cell count, unspecified: Secondary | ICD-10-CM | POA: Diagnosis not present

## 2017-02-26 DIAGNOSIS — Z7901 Long term (current) use of anticoagulants: Secondary | ICD-10-CM | POA: Diagnosis not present

## 2017-02-26 DIAGNOSIS — G894 Chronic pain syndrome: Secondary | ICD-10-CM | POA: Diagnosis not present

## 2017-02-26 DIAGNOSIS — E785 Hyperlipidemia, unspecified: Secondary | ICD-10-CM | POA: Diagnosis not present

## 2017-03-08 DIAGNOSIS — J069 Acute upper respiratory infection, unspecified: Secondary | ICD-10-CM | POA: Diagnosis not present

## 2017-03-08 DIAGNOSIS — J209 Acute bronchitis, unspecified: Secondary | ICD-10-CM | POA: Diagnosis not present

## 2017-03-19 ENCOUNTER — Ambulatory Visit: Payer: Medicare HMO | Admitting: Physician Assistant

## 2017-03-21 NOTE — Progress Notes (Signed)
No ICM remote transmission received for 03/01/2017 and next ICM transmission scheduled for 03/25/2017.

## 2017-03-25 ENCOUNTER — Telehealth: Payer: Self-pay

## 2017-03-25 ENCOUNTER — Telehealth: Payer: Self-pay | Admitting: Cardiology

## 2017-03-25 ENCOUNTER — Ambulatory Visit (INDEPENDENT_AMBULATORY_CARE_PROVIDER_SITE_OTHER): Payer: Medicare HMO | Admitting: *Deleted

## 2017-03-25 DIAGNOSIS — I5022 Chronic systolic (congestive) heart failure: Secondary | ICD-10-CM

## 2017-03-25 DIAGNOSIS — Z95810 Presence of automatic (implantable) cardiac defibrillator: Secondary | ICD-10-CM | POA: Diagnosis not present

## 2017-03-25 DIAGNOSIS — I428 Other cardiomyopathies: Secondary | ICD-10-CM

## 2017-03-25 NOTE — Telephone Encounter (Signed)
Remote ICM transmission received.  Attempted call to patient and left message to return call. 

## 2017-03-25 NOTE — Progress Notes (Signed)
EPIC Encounter for ICM Monitoring  Patient Name: Emily Abbott is a 77 y.o. female Date: 03/25/2017 Primary Care Physican: Harlan Stains, MD Primary Cardiologist:Taylor Electrophysiologist: Lovena Le Dry Weight: unknown Bi-V Pacing: >99% Battery Longevity: 5.1 months   AT/AF Burden: 15% (was 4.5% on 12/24/2016)    Attempted call to patient and unable to reach.  Left message to return call.  Transmission reviewed.    Thoracic impedance normal.  Prescribed dosage: Furosemide 20 mg 2 tablets (40 mg total) daily.   LABS from Dr Orest Dikes office 06/21/2016 Creatinine 1.36, BUN 22, Potassium 3.6, Sodium 137, EGFR 38-46 06/20/2015 Creatinine 1.12, BUN 22, Potassium 4.5, Sodium 141, EGFR 47 (from Dr [facility] office).  Recommendations: NONE - Unable to reach.  Follow-up plan: ICM clinic phone appointment on 05/06/2017.  Office appointment scheduled 04/01/2017 with Tommye Standard, PA.  Copy of ICM check sent to Dr. Lovena Le.   3 month ICM trend: 03/25/2017    AT/AF        1 Year ICM trend:       [name], RN 03/25/2017 3:56 PM

## 2017-03-25 NOTE — Telephone Encounter (Signed)
LMOVM reminding pt to send remote transmission.   

## 2017-03-26 ENCOUNTER — Encounter: Payer: Self-pay | Admitting: Cardiology

## 2017-03-26 NOTE — Progress Notes (Signed)
Remote ICD transmission.   

## 2017-03-27 DIAGNOSIS — J438 Other emphysema: Secondary | ICD-10-CM | POA: Diagnosis not present

## 2017-03-28 DIAGNOSIS — J019 Acute sinusitis, unspecified: Secondary | ICD-10-CM | POA: Diagnosis not present

## 2017-03-28 DIAGNOSIS — Z7901 Long term (current) use of anticoagulants: Secondary | ICD-10-CM | POA: Diagnosis not present

## 2017-04-01 NOTE — Progress Notes (Signed)
Patient returned call.  She is on her 2nd antibiotic for bronchitis and starting to feel better. She eventually is supposed to have thyroid surgery and has an appointment with Tommye Standard, PA for clearance.  She denied any fluid symptoms at this time.

## 2017-04-01 NOTE — Progress Notes (Addendum)
Cardiology Office Note Date:  04/03/2017  Patient ID:  Emily, Abbott 11/18/1940, MRN [id number] PCP:  Harlan Stains, MD  Electrophysiologist: Dr. Lovena Le   Chief Complaint: pre-op eval  History of Present Illness: Emily Abbott is a 77 y.o. female with history of no known CAD, Notes mention history of normal coronaries by cath in 2010, no further cath procedures that I find, CM w/ICD, chronic CHF (systolic w/recovered LVEF by last echos), HTN, HLD, DM, COPD with PRN home O2, uses infrequently,  Hx of PE and PAFib on warfarin.  She also has hx of MV repair (40mm Physio ring annuloplasty) done in 2010, with LAA ligation and MAZE inter-op.  She comes in today to be seen for Dr. Lovena Le.  Last seen by him in July 2018, at that visit doing fairly well, class II symptoms, lasix was up-titrated.  The patient has been battling her COPD and in the last month or 2 a couple regimes of antibiotics with recurrent bronchitis.  Repots a very productive cough with thick sputum and  "terrible" sinus congestion and post nasal gtt that is driving her throat crazy.  She has baseline DOE.  She reports that she walks in [place] with a cart for her exercise, can browse around for 2 hours easily, though this is a very casual pace, she cares for her home on her own, vacuuming, mopping, and does some light yardwork as well.  She can climb a "normal" flight of stairs though will make her winded this is unchanged for years.  Inclines will also make her feel more SOB, also not new for years.  She can walk on flat ground easily shorter distances out side a block, this has been her baseline.  She thinks as she gets older she has had a gradual slowing, but no dramatic change in her exertional capacity in the last couple years.  She mentions a CP, this is L sided, on off "for many years" is very fleeting, lasts a second.  She states when her sinuses were so bad a week or 2 ago, she was standing at her counter when her daughter  walked in and she moved quickly to get out of the way of the door, and spun around, got very dizzy and thinks she might have fainted a second.  Recalls grabbing the counter to stop herself, but not hitting the floor. None since, or previously.  She thinks the medicines she was on at the time for her sinuses contributed (unknown what) but stopped it.  She reports her PMD monitors her INR, denies any bleeding or signs of bleeding   Device information: SJM CRT-D, implanted 03/17/09  RCRI = 0.9 DASI = 5.93 METS  Past Medical History:  Diagnosis Date  . Anemia   . Anxiety   . CAD (coronary artery disease)   . COPD (chronic obstructive pulmonary disease) ([place])   . Diabetes mellitus    type II  . GERD (gastroesophageal reflux disease)   . HTN (hypertension)    essential  . Hyperlipidemia, mixed   . Mitral valve disorder   . Osteoarthritis   . Panic attack   . Paroxysmal atrial fibrillation (HCC)   . Pulmonary embolism [facility])     Past Surgical History:  Procedure Laterality Date  . BIOPSY BREAST    . BTL    . CARDIAC VALVE SURGERY     mitral  . carpal tennel  2003    Current Outpatient Medications  Medication Sig Dispense  Refill  . albuterol (PROAIR HFA) 108 (90 Base) MCG/ACT inhaler Inhale 1-2 puffs into the lungs every 6 (six) hours as needed for wheezing or shortness of breath. 18 g 5  . ALPRAZolam (XANAX) 0.5 MG tablet Take 0.5 mg by mouth 2 (two) times daily as needed.     Marland Kitchen amLODipine (NORVASC) 5 MG tablet Take 5 mg by mouth daily.    Marland Kitchen atorvastatin (LIPITOR) 20 MG tablet Take 1 tablet by mouth daily.  1  . BREO ELLIPTA 100-25 MCG/INH AEPB INHALE 1 PUFF DAILY 60 each 0  . carvedilol (COREG) 6.25 MG tablet Take 6.25 mg by mouth 2 (two) times daily with a meal.    . cefdinir (OMNICEF) 300 MG capsule Take 300 mg by mouth daily. For 7 days  0  . dofetilide (TIKOSYN) 250 MCG capsule TAKE ONE CAPSULE BY MOUTH TWICE A DAY 60 capsule 11  . ezetimibe (ZETIA) 10 MG tablet Take 10  mg by mouth daily.    . ferrous sulfate 325 (65 FE) MG tablet Take 1 tablet by mouth daily. Reported on 07/12/2015  5  . furosemide (LASIX) 20 MG tablet TAKE 2 TABLETS BY MOUTH EVERY DAY 180 tablet 2  . glucosamine-chondroitin 500-400 MG tablet Take 1 tablet by mouth daily. Reported on 07/12/2015    . HYDROcodone-acetaminophen (VICODIN) 5-500 MG per tablet Take 1 tablet by mouth as directed.     . linagliptin (TRADJENTA) 5 MG TABS tablet Take 5 mg by mouth daily.    . magnesium gluconate (MAGONATE) 500 MG tablet Take 500 mg by mouth daily. Reported on 07/12/2015    . omeprazole (PRILOSEC) 40 MG capsule Take 40 mg by mouth daily.      Marland Kitchen PARoxetine (PAXIL) 30 MG tablet Take 30 mg by mouth daily.      Marland Kitchen tiZANidine (ZANAFLEX) 2 MG tablet Take by mouth every 6 (six) hours as needed for muscle spasms.    . Vitamin D, Ergocalciferol, (DRISDOL) 50000 units CAPS capsule Take 50,000 Units by mouth every 7 (seven) days.    . vitamin E 200 UNIT capsule Take 200 Units by mouth daily. Reported on 07/12/2015    . warfarin (COUMADIN) 5 MG tablet Use as directed by Alta Corning, MD     . zolpidem (AMBIEN) 10 MG tablet Take 5 mg by mouth at bedtime as needed for sleep.      No current facility-administered medications for this visit.     Allergies:   Sulfonamide derivatives   Social History:  The patient  reports that she quit smoking about 16 years ago. Her smoking use included cigarettes. She has a 50.00 pack-year smoking history. she has never used smokeless tobacco. She reports that she does not drink alcohol.   Family History:  The patient's family history includes Anuerysm in her father; Cancer in her brother; Diabetes in her sister; Stroke in her mother; Ulcers in her sister.  ROS:  Please see the history of present illness.    All other systems are reviewed and otherwise negative.   PHYSICAL EXAM: VS:  BP 138/82   Pulse 70   Ht 5' 6.5" (1.689 m)   Wt 204 lb (92.5 kg)   BMI 32.43 kg/m  BMI: Body  mass index is 32.43 kg/m. Well nourished, well developed, in no acute distress  HEENT: normocephalic, atraumatic, raspy voice Neck: no JVD, carotid bruits or masses Cardiac:  RRR; no significant murmurs, are appreciated no rubs, or gallops Lungs:  CTA b/l, no wheezing,  rhonchi or rales  Abd: soft, nontender MS: no deformity, age appropriate atrophy Ext:  no edema is appreciated Skin: warm and dry, no rash Neuro:  No gross deficits appreciated today Psych: euthymic mood, full affect  ICD site is stable, no tethering or discomfort   EKG:  Done today shows SR (baseline artifact), V paced, 68bpm, QRS 151ms, QTc 539ms ICD interrogation done today and reviewed by myself: Interrogation confirms SR, she presents in SR/BiVe paced, battery is nearing ERI (approx 4.23mos), lead measurements are good, no VT or VF observations, + AMS AF burden has been up in the last 2 months, 15%, December/January particularly.  11/15/14: TTE Study Conclusions - Left ventricle: The cavity size was normal. Wall thickness was   normal. Systolic function was normal. The estimated ejection   fraction was in the range of 60% to 65%. Wall motion was normal;   there were no regional wall motion abnormalities. Previous mitral   surgery and the presence of atrial fibrillation prevents   evaluation of LV diastolic function. - Mitral valve: Prior procedures included surgical repair. An   annular ring prosthesis was present. Valve area by pressure   half-time: 1.71 cm^2. Valve area by continuity equation (using   LVOT flow): 2.09 cm^2. - Left atrium: The atrium was mildly dilated. - Pulmonary arteries: Systolic pressure was mildly increased. PA   peak pressure: 39 mm Hg (S).  2015: LVEF 50-55% 2010: LVEF 35%  Recent Labs: No results found for requested labs within last 8760 hours.  No results found for requested labs within last 8760 hours.   CrCl cannot be calculated (Patient's most recent lab result is older than  the maximum 21 days allowed.).   Wt Readings from Last 3 Encounters:  04/03/17 204 lb (92.5 kg)  11/09/16 203 lb 3.2 oz (92.2 kg)  08/20/16 200 lb (90.7 kg)     Other studies reviewed: Additional studies/records reviewed today include: summarized above  ASSESSMENT AND PLAN:  1. ICD     Intact function, no VT or VF  2. Hx of CM with recovered LVEF by last couple echos     I do not appreciate any particular change in her exertional capacity over the last couple years  3. VHD w/Hx of MV repair     No appreciable murmur on exam  4. Paroxysmal AFib, hx of PE     CHA2DS2Vasc is 5, on warfarin     On Tikosyn, last available Creat from Oct 1.37, (Calc CrCl using weight done about that time is 49, 225mcg dose appropriate)     BMET and mag today     QTc is OK given ICD in place     increased AFib burden of late, recent COPD/bronchitis, and pending surgery for parathyroid?  She reports "something is wrong with my thyroid and calcium"     Follow without changes for now     Rate controlled  5. Chronic CHF     CorVue is below threshold, possibly her recent/recurrent bronchitis/COPD exacerbations,      Her exam does not suggest fluid OL     Weight is only up 1 lb from Sept  6. Syncope     This was described as a moment, occurred with quick/sudden movement, dizziness, and an unknown decongestant at the time      Not recurrent      No VT/VF noted on her device, intact ICD function  7. Pre-op     Parathyroid surgery, not yet scheduled  Her risk score is low at 0.9 METS by [facility] activity is index is 5.93 Based on ACC/AHA guidelines, ROSEANN KEES does not require further cardiovascular testing.   Her exertional capacity appears to be at her baseline ADDEND: discussed with Dr. Lovena Le, no objection from cardiac/EP standpoint to surgery planned  She will need peri-op device management if 2 consecutive doses of Tikosyn are missed we will need to be contacted to discuss resumption  From  an AFib perspective she does not have CVA/TIA history and her LAA was ligated at the time of her MVRepalcement, no objection to holding warfarin for surgery, to resume when safe to do so I will defer DVT prophylaxis/hx of PE to her PMD/surgeon.  8. COPD, reports of recurrent bronchitis, currently on 2nd regime of antibiotics     C/w her PMD   Disposition: F/u with Q 17mon remotes, will have her back in clinic in 19mon, check on her battery and AFib burden.  Current medicines are reviewed at length with the patient today.  The patient did not have any concerns regarding medicines  Signed, Tommye Standard, PA-C 04/03/2017 4:35 PM     [address] [place] [id number] [phone] (office)  [phone] (fax)

## 2017-04-03 ENCOUNTER — Ambulatory Visit: Payer: Medicare HMO | Admitting: Physician Assistant

## 2017-04-03 VITALS — BP 138/82 | HR 70 | Ht 66.5 in | Wt 204.0 lb

## 2017-04-03 DIAGNOSIS — I480 Paroxysmal atrial fibrillation: Secondary | ICD-10-CM | POA: Diagnosis not present

## 2017-04-03 DIAGNOSIS — Z95810 Presence of automatic (implantable) cardiac defibrillator: Secondary | ICD-10-CM | POA: Diagnosis not present

## 2017-04-03 DIAGNOSIS — I4891 Unspecified atrial fibrillation: Secondary | ICD-10-CM | POA: Diagnosis not present

## 2017-04-03 DIAGNOSIS — I5030 Unspecified diastolic (congestive) heart failure: Secondary | ICD-10-CM

## 2017-04-03 DIAGNOSIS — Z9889 Other specified postprocedural states: Secondary | ICD-10-CM | POA: Diagnosis not present

## 2017-04-03 NOTE — Patient Instructions (Addendum)
Medication Instructions:   STOP TAKING ASPIRIN   STOP TAKING BENADRYL    If you need a refill on your cardiac medications before your next appointment, please call your pharmacy.  Labwork:  NONE ORDERED  TODAY    Testing/Procedures: NONE ORDERED  TODAY    Follow-Up:  Your physician wants you to follow-up in:  IN  6  MONTHS WITH DR Lovenia Shuck   You will receive a reminder letter in the mail two months in advance. If you don't receive a letter, please call our office to schedule the follow-up appointment.   Remote monitoring is used to monitor your Pacemaker of ICD from home. This monitoring reduces the number of office visits required to check your device to one time per year. It allows [place] to keep an eye on the functioning of your device to ensure it is working properly. You are scheduled for a device check from home on...06-24-17 You may send your transmission at any time that day. If you have a wireless device, the transmission will be sent automatically. After your physician reviews your transmission, you will receive a postcard with your next transmission date.     Any Other Special Instructions Will Be Listed Below (If Applicable).                                                                                                                                                 '

## 2017-04-05 ENCOUNTER — Telehealth: Payer: Self-pay | Admitting: Physician Assistant

## 2017-04-05 NOTE — Telephone Encounter (Signed)
close

## 2017-04-08 ENCOUNTER — Other Ambulatory Visit: Payer: Medicare HMO | Admitting: *Deleted

## 2017-04-08 DIAGNOSIS — I4891 Unspecified atrial fibrillation: Secondary | ICD-10-CM | POA: Diagnosis not present

## 2017-04-09 LAB — MAGNESIUM: MAGNESIUM: 2.1 mg/dL (ref 1.6–2.3)

## 2017-04-09 LAB — BASIC METABOLIC PANEL
BUN/Creatinine Ratio: 15 (ref 12–28)
BUN: 17 mg/dL (ref 8–27)
CO2: 22 mmol/L (ref 20–29)
CREATININE: 1.15 mg/dL — AB (ref 0.57–1.00)
Calcium: 10.1 mg/dL (ref 8.7–10.3)
Chloride: 100 mmol/L (ref 96–106)
GFR calc Af Amer: 53 mL/min/{1.73_m2} — ABNORMAL LOW (ref >59)
GFR, EST NON AFRICAN AMERICAN: 46 mL/min/{1.73_m2} — AB (ref >59)
Glucose: 169 mg/dL — ABNORMAL HIGH (ref 65–99)
Potassium: 4.2 mmol/L (ref 3.5–5.2)
SODIUM: 140 mmol/L (ref 134–144)

## 2017-04-11 DIAGNOSIS — Z7901 Long term (current) use of anticoagulants: Secondary | ICD-10-CM | POA: Diagnosis not present

## 2017-04-12 LAB — CUP PACEART REMOTE DEVICE CHECK
Battery Remaining Longevity: 5 mo
Battery Remaining Percentage: 5 %
Battery Voltage: 2.63 V
Brady Statistic AP VP Percent: 35 %
Brady Statistic AP VS Percent: 1 %
Brady Statistic AS VP Percent: 65 %
Brady Statistic AS VS Percent: 1 %
Brady Statistic RA Percent Paced: 28 %
Date Time Interrogation Session: 20190204203441
HighPow Impedance: 65 Ohm
HighPow Impedance: 65 Ohm
Implantable Lead Implant Date: 20110127
Implantable Lead Implant Date: 20110127
Implantable Lead Implant Date: 20110127
Implantable Lead Location: 753858
Implantable Lead Location: 753859
Implantable Lead Location: 753860
Implantable Lead Model: 7122
Implantable Pulse Generator Implant Date: 20110127
Lead Channel Impedance Value: 380 Ohm
Lead Channel Impedance Value: 460 Ohm
Lead Channel Impedance Value: 980 Ohm
Lead Channel Pacing Threshold Amplitude: 0.625 V
Lead Channel Pacing Threshold Amplitude: 0.75 V
Lead Channel Pacing Threshold Amplitude: 1 V
Lead Channel Pacing Threshold Pulse Width: 0.5 ms
Lead Channel Pacing Threshold Pulse Width: 0.5 ms
Lead Channel Pacing Threshold Pulse Width: 0.5 ms
Lead Channel Sensing Intrinsic Amplitude: 12 mV
Lead Channel Sensing Intrinsic Amplitude: 2.4 mV
Lead Channel Setting Pacing Amplitude: 2 V
Lead Channel Setting Pacing Amplitude: 2 V
Lead Channel Setting Pacing Amplitude: 2 V
Lead Channel Setting Pacing Pulse Width: 0.5 ms
Lead Channel Setting Pacing Pulse Width: 0.5 ms
Lead Channel Setting Sensing Sensitivity: 0.5 mV
Pulse Gen Serial Number: 752248

## 2017-04-18 DIAGNOSIS — Z780 Asymptomatic menopausal state: Secondary | ICD-10-CM | POA: Diagnosis not present

## 2017-04-18 DIAGNOSIS — E213 Hyperparathyroidism, unspecified: Secondary | ICD-10-CM | POA: Diagnosis not present

## 2017-04-24 DIAGNOSIS — J438 Other emphysema: Secondary | ICD-10-CM | POA: Diagnosis not present

## 2017-05-06 ENCOUNTER — Telehealth: Payer: Self-pay | Admitting: Cardiology

## 2017-05-06 ENCOUNTER — Ambulatory Visit (INDEPENDENT_AMBULATORY_CARE_PROVIDER_SITE_OTHER): Payer: Medicare HMO

## 2017-05-06 DIAGNOSIS — Z95810 Presence of automatic (implantable) cardiac defibrillator: Secondary | ICD-10-CM | POA: Diagnosis not present

## 2017-05-06 DIAGNOSIS — I5030 Unspecified diastolic (congestive) heart failure: Secondary | ICD-10-CM

## 2017-05-06 NOTE — Telephone Encounter (Signed)
Spoke with pt and reminded pt of remote transmission that is due today. Pt verbalized understanding.   

## 2017-05-07 ENCOUNTER — Telehealth: Payer: Self-pay

## 2017-05-07 DIAGNOSIS — R69 Illness, unspecified: Secondary | ICD-10-CM | POA: Diagnosis not present

## 2017-05-07 NOTE — Progress Notes (Signed)
Patient called back.  She stated she has increased shortness of breath for the last week. She has not noticed any other changes.   She missed one dose of Tikosyn and Carvedilol within the last few weeks but unsure of the date. She has not noticed any rhythm changes.  Confirmed she is currently taking Furosemide 20 mg 2 tablets (40 mg total) daily.    Advised will send to Dr Lovena Le for review and recommendations if needed.

## 2017-05-07 NOTE — Progress Notes (Signed)
Consulted with Dr Lovena Le and his nurse.  Call to patient and advised Dr Lovena Le recommended to increase Furosemide to 40 mg twice a day x 3 days and then return to prescribed dosage.  Also would like for patient to schedule appointment with Dr Lovena Le as soon as he has availability.  She said that wold be fine and prefers afternoon appointment if there is a choice of times.  Advised I have sent a message to the scheduler and she will call her to arrange an appointment.

## 2017-05-07 NOTE — Telephone Encounter (Signed)
Remote ICM transmission received.  Attempted call to patient and left message to return call. 

## 2017-05-07 NOTE — Progress Notes (Signed)
EPIC Encounter for ICM Monitoring  Patient Name: Emily Abbott is a 77 y.o. female Date: 05/07/2017 Primary Care Physican: Harlan Stains, MD Primary Cardiologist:Taylor Electrophysiologist: Lovena Le Dry Weight: unknown Bi-V Pacing: >99% ERI < 3 months  AT/AF Burden since 04/03/2017: 27% (increased from 15%)     Attempted call to patient and unable to reach.  Left message to return call.  Transmission reviewed.    Thoracic impedance abnormal suggesting fluid accumulation.  Prescribed dosage: Furosemide 20 mg 2 tablets (40 mg total) daily.   Labs: 04/08/2017 Creatinine 1.15, BUN 17, EGFR 46-53 06/21/2016 Creatinine 1.36, BUN 22, Potassium 3.6, Sodium 137, EGFR 38-46 from Dr [facility] office 06/20/2015 Creatinine 1.12, BUN 22, Potassium 4.5, Sodium 141, EGFR 47 (from Dr [facility] office). from Dr Orest Dikes office  Recommendations: NONE - Unable to reach.  Follow-up plan: ICM clinic phone appointment on 05/16/2017 to recheck fluid levels. .  Copy of ICM check sent to Dr. Lovena Le.   3 month ICM trend: 05/07/2017     AT/AF             1 Year ICM trend:          AT/AF      [name], RN 05/07/2017 2:47 PM

## 2017-05-16 ENCOUNTER — Telehealth: Payer: Self-pay

## 2017-05-16 ENCOUNTER — Ambulatory Visit (INDEPENDENT_AMBULATORY_CARE_PROVIDER_SITE_OTHER): Payer: Self-pay

## 2017-05-16 DIAGNOSIS — Z95810 Presence of automatic (implantable) cardiac defibrillator: Secondary | ICD-10-CM

## 2017-05-16 DIAGNOSIS — I5030 Unspecified diastolic (congestive) heart failure: Secondary | ICD-10-CM

## 2017-05-16 NOTE — Telephone Encounter (Signed)
Spoke with pt and reminded pt of remote transmission that is due today. Pt verbalized understanding.   

## 2017-05-16 NOTE — Progress Notes (Signed)
EPIC Encounter for ICM Monitoring  Patient Name: Emily Abbott is a 77 y.o. female Date: 05/16/2017 Primary Care Physican: Harlan Stains, MD Primary Cardiologist:Taylor Electrophysiologist: Lovena Le Dry Weight: unknown Bi-V Pacing: 90% ERI < 3 months           AT/AF Burden 35%        Heart Failure questions reviewed, pt reported SOB has improved since taking extra Furosemide   Thoracic impedance returned to normal after taking extra Furosemide.  Prescribed dosage: Furosemide 20 mg 2 tablets (40 mg total) daily.   Labs: 04/08/2017 Creatinine 1.15, BUN 17, EGFR 46-53 06/21/2016 Creatinine 1.36, BUN 22, Potassium 3.6, Sodium 137, EGFR 38-46 from Dr Orest Dikes office 06/20/2015 Creatinine 1.12, BUN 22, Potassium 4.5, Sodium 141,EGFR 47(from Dr [facility] office).   Recommendations: No changes.  Encouraged to call for fluid symptoms.  Follow-up plan: ICM clinic phone appointment on 06/24/2017.  Office appointment scheduled 05/17/2017 with Dr. Lovena Le.  Copy of ICM check sent to Dr. Lovena Le.   3 month ICM trend: 05/16/2017    1 Year ICM trend:       [name], RN 05/16/2017 12:46 PM

## 2017-05-17 ENCOUNTER — Encounter (INDEPENDENT_AMBULATORY_CARE_PROVIDER_SITE_OTHER): Payer: Self-pay

## 2017-05-17 ENCOUNTER — Ambulatory Visit: Payer: Medicare HMO | Admitting: Internal Medicine

## 2017-05-17 ENCOUNTER — Encounter: Payer: Self-pay | Admitting: Internal Medicine

## 2017-05-17 VITALS — BP 130/52 | HR 66 | Ht 66.0 in | Wt 205.0 lb

## 2017-05-17 DIAGNOSIS — I480 Paroxysmal atrial fibrillation: Secondary | ICD-10-CM

## 2017-05-17 DIAGNOSIS — Z95810 Presence of automatic (implantable) cardiac defibrillator: Secondary | ICD-10-CM | POA: Diagnosis not present

## 2017-05-17 DIAGNOSIS — I5022 Chronic systolic (congestive) heart failure: Secondary | ICD-10-CM | POA: Diagnosis not present

## 2017-05-17 LAB — CUP PACEART INCLINIC DEVICE CHECK
Battery Remaining Longevity: 3 mo
Brady Statistic RA Percent Paced: 18 %
Brady Statistic RV Percent Paced: 90 %
Date Time Interrogation Session: 20190329163435
HighPow Impedance: 68.625
Implantable Lead Implant Date: 20110127
Implantable Lead Implant Date: 20110127
Implantable Lead Location: 753858
Implantable Pulse Generator Implant Date: 20110127
Lead Channel Impedance Value: 1125 Ohm
Lead Channel Impedance Value: 400 Ohm
Lead Channel Pacing Threshold Amplitude: 0.75 V
Lead Channel Pacing Threshold Amplitude: 0.75 V
Lead Channel Pacing Threshold Amplitude: 0.75 V
Lead Channel Pacing Threshold Amplitude: 1 V
Lead Channel Pacing Threshold Pulse Width: 0.5 ms
Lead Channel Pacing Threshold Pulse Width: 0.5 ms
Lead Channel Pacing Threshold Pulse Width: 0.5 ms
Lead Channel Sensing Intrinsic Amplitude: 2.8 mV
Lead Channel Setting Pacing Amplitude: 2 V
Lead Channel Setting Pacing Pulse Width: 0.5 ms
Lead Channel Setting Sensing Sensitivity: 0.5 mV
MDC IDC LEAD IMPLANT DT: 20110127
MDC IDC LEAD LOCATION: 753859
MDC IDC LEAD LOCATION: 753860
MDC IDC MSMT LEADCHNL LV PACING THRESHOLD AMPLITUDE: 1 V
MDC IDC MSMT LEADCHNL LV PACING THRESHOLD PULSEWIDTH: 0.5 ms
MDC IDC MSMT LEADCHNL RA PACING THRESHOLD PULSEWIDTH: 0.5 ms
MDC IDC MSMT LEADCHNL RV IMPEDANCE VALUE: 487.5 Ohm
MDC IDC MSMT LEADCHNL RV PACING THRESHOLD AMPLITUDE: 0.75 V
MDC IDC MSMT LEADCHNL RV PACING THRESHOLD PULSEWIDTH: 0.5 ms
MDC IDC MSMT LEADCHNL RV SENSING INTR AMPL: 11.9 mV
MDC IDC PG SERIAL: 752248
MDC IDC SET LEADCHNL LV PACING AMPLITUDE: 2 V
MDC IDC SET LEADCHNL LV PACING PULSEWIDTH: 0.5 ms
MDC IDC SET LEADCHNL RV PACING AMPLITUDE: 2 V

## 2017-05-17 NOTE — Progress Notes (Signed)
HPI Emily Abbott returns for ongoing ICD followup. She is a pleasant 77 yo woman with a h/o CAD, s/p MI, chronic systolic heart failure, s/p BiV ICD. She has been stable.  She has occaisional palpitations. She notes dyspnea with exertion. Mild peripheral edema which she treats with diuretic therapy. No ICD shocks. Her blood pressure has been under better control.  She has a h/o PAF and has been noted to have increasingly frequent episodes despite medical therapy with dofetilide. She denies chest pain. She has required adjustment of her diuretic dose due to her propensity for volume overload.   Allergies  Allergen Reactions  . Sulfonamide Derivatives     Unknown     Current Outpatient Medications  Medication Sig Dispense Refill  . albuterol (PROAIR HFA) 108 (90 Base) MCG/ACT inhaler Inhale 1-2 puffs into the lungs every 6 (six) hours as needed for wheezing or shortness of breath. 18 g 5  . ALPRAZolam (XANAX) 0.5 MG tablet Take 0.5 mg by mouth 2 (two) times daily as needed.     Marland Kitchen amLODipine (NORVASC) 5 MG tablet Take 5 mg by mouth daily.    Marland Kitchen atorvastatin (LIPITOR) 20 MG tablet Take 1 tablet by mouth daily.  1  . BREO ELLIPTA 100-25 MCG/INH AEPB INHALE 1 PUFF DAILY 60 each 0  . carvedilol (COREG) 6.25 MG tablet Take 6.25 mg by mouth 2 (two) times daily with a meal.    . Cyanocobalamin (VITAMIN B12 PO) Take 1 tablet by mouth daily.    Marland Kitchen dofetilide (TIKOSYN) 250 MCG capsule TAKE ONE CAPSULE BY MOUTH TWICE A DAY 60 capsule 11  . ezetimibe (ZETIA) 10 MG tablet Take 10 mg by mouth daily.    . ferrous sulfate 325 (65 FE) MG tablet Take 1 tablet by mouth daily. Reported on 07/12/2015  5  . furosemide (LASIX) 20 MG tablet TAKE 2 TABLETS BY MOUTH EVERY DAY 180 tablet 2  . glucosamine-chondroitin 500-400 MG tablet Take 1 tablet by mouth daily. Reported on 07/12/2015    . HYDROcodone-acetaminophen (VICODIN) 5-500 MG per tablet Take 1 tablet by mouth as directed.     . linagliptin (TRADJENTA) 5 MG  TABS tablet Take 5 mg by mouth daily.    . magnesium gluconate (MAGONATE) 500 MG tablet Take 500 mg by mouth daily. Reported on 07/12/2015    . omeprazole (PRILOSEC) 40 MG capsule Take 40 mg by mouth daily.      Marland Kitchen PARoxetine (PAXIL) 30 MG tablet Take 30 mg by mouth daily.      Marland Kitchen tiZANidine (ZANAFLEX) 2 MG tablet Take by mouth every 6 (six) hours as needed for muscle spasms.    . Vitamin D, Ergocalciferol, (DRISDOL) 50000 units CAPS capsule Take 50,000 Units by mouth every 7 (seven) days.    Marland Kitchen warfarin (COUMADIN) 5 MG tablet Use as directed by Alta Corning, MD     . zolpidem (AMBIEN) 10 MG tablet Take 5 mg by mouth at bedtime as needed for sleep.      No current facility-administered medications for this visit.      Past Medical History:  Diagnosis Date  . Anemia   . Anxiety   . CAD (coronary artery disease)   . COPD (chronic obstructive pulmonary disease) ([place])   . Diabetes mellitus    type II  . GERD (gastroesophageal reflux disease)   . HTN (hypertension)    essential  . Hyperlipidemia, mixed   . Mitral valve disorder   . Osteoarthritis   .  Panic attack   . Paroxysmal atrial fibrillation (HCC)   . Pulmonary embolism (HCC)     ROS:   All systems reviewed and negative except as noted in the HPI.   Past Surgical History:  Procedure Laterality Date  . BIOPSY BREAST    . BTL    . CARDIAC VALVE SURGERY     mitral  . carpal tennel  2003     Family History  Problem Relation Age of Onset  . Stroke Mother   . Anuerysm Father   . Diabetes Sister   . Cancer Brother   . Ulcers Sister      Social History   Socioeconomic History  . Marital status: Widowed    Spouse name: Not on file  . Number of children: Not on file  . Years of education: Not on file  . Highest education level: Not on file  Occupational History  . Occupation: reitred  Social Needs  . Financial resource strain: Not on file  . Food insecurity:    Worry: Not on file    Inability: Not on file    . Transportation needs:    Medical: Not on file    Non-medical: Not on file  Tobacco Use  . Smoking status: Former Smoker    Packs/day: 1.00    Years: 50.00    Pack years: 50.00    Types: Cigarettes    Last attempt to quit: 02/19/2001    Years since quitting: 16.2  . Smokeless tobacco: Never Used  Substance and Sexual Activity  . Alcohol use: No    Alcohol/week: 0.0 oz  . Drug use: Not on file  . Sexual activity: Not on file  Lifestyle  . Physical activity:    Days per week: Not on file    Minutes per session: Not on file  . Stress: Not on file  Relationships  . Social connections:    Talks on phone: Not on file    Gets together: Not on file    Attends religious service: Not on file    Active member of club or organization: Not on file    Attends meetings of clubs or organizations: Not on file    Relationship status: Not on file  . Intimate partner violence:    Fear of current or ex partner: Not on file    Emotionally abused: Not on file    Physically abused: Not on file    Forced sexual activity: Not on file  Other Topics Concern  . Not on file  Social History Narrative  . Not on file     BP (!) 130/52   Pulse 66   Ht 5\' 6"  (1.676 m)   Wt 205 lb (93 kg)   SpO2 98%   BMI 33.09 kg/m   Physical Exam:  Well appearing 77 yo woman, NAD HEENT: Unremarkable Neck:  7 cm JVD, no thyromegally Lymphatics:  No adenopathy Back:  No CVA tenderness Lungs:  Clear with no wheezes HEART:  Regular rate rhythm, no murmurs, no rubs, no clicks Abd:  soft, positive bowel sounds, no organomegally, no rebound, no guarding Ext:  2 plus pulses, no edema, no cyanosis, no clubbing Skin:  No rashes no nodules Neuro:  CN II through XII intact, motor grossly intact  EKG - NSR with biv pacing  DEVICE  Normal device function.  See PaceArt for details.   Assess/Plan: 1. PAF - she is still mostly in NSR. Unfortunately we have no additional options for  improving her atrial fib. We  cannot add more dofetilide has she is on the proper dose. She will continue her current meds. 2. ICD - her [employer] BiV ICD is working normally. Will continue her current meds. 3. CHF - her chronic systolic heart failure is ok. She will continue her lasix. If her weight goes up more than 2 lbs, then she is instructed to increase her lasix to 40 bid. 4. Dyslipidemia - she will continue her statin therapy and maintain a low fat diet.  Mikle Bosworth.D.

## 2017-05-17 NOTE — Patient Instructions (Signed)
Medication Instructions:  Your physician has recommended you make the following change in your medication:  --If your weight goes up 2 pounds, take 2 additional lasix tablets along with your normal 2 lasix tablets until your weight goes back down.  Labwork: None ordered.  Testing/Procedures: None ordered.  Follow-Up: Your physician wants you to follow-up in: 6 months with Dr. Lovena Le.     Remote monitoring is used to monitor your ICD from home. This monitoring reduces the number of office visits required to check your device to one time per year. It allows [place] to keep an eye on the functioning of your device to ensure it is working properly. You are scheduled for a device check from home on 06/24/2017. You may send your transmission at any time that day. If you have a wireless device, the transmission will be sent automatically. After your physician reviews your transmission, you will receive a postcard with your next transmission date.  Any Other Special Instructions Will Be Listed Below (If Applicable).  If you need a refill on your cardiac medications before your next appointment, please call your pharmacy.

## 2017-05-20 ENCOUNTER — Ambulatory Visit: Payer: Self-pay | Admitting: Surgery

## 2017-05-20 DIAGNOSIS — J449 Chronic obstructive pulmonary disease, unspecified: Secondary | ICD-10-CM | POA: Diagnosis not present

## 2017-05-20 DIAGNOSIS — I429 Cardiomyopathy, unspecified: Secondary | ICD-10-CM | POA: Diagnosis not present

## 2017-05-20 DIAGNOSIS — Z7901 Long term (current) use of anticoagulants: Secondary | ICD-10-CM | POA: Diagnosis not present

## 2017-05-20 DIAGNOSIS — E113299 Type 2 diabetes mellitus with mild nonproliferative diabetic retinopathy without macular edema, unspecified eye: Secondary | ICD-10-CM | POA: Diagnosis not present

## 2017-05-20 DIAGNOSIS — G894 Chronic pain syndrome: Secondary | ICD-10-CM | POA: Diagnosis not present

## 2017-05-20 DIAGNOSIS — Z0000 Encounter for general adult medical examination without abnormal findings: Secondary | ICD-10-CM | POA: Diagnosis not present

## 2017-05-20 DIAGNOSIS — E1121 Type 2 diabetes mellitus with diabetic nephropathy: Secondary | ICD-10-CM | POA: Diagnosis not present

## 2017-05-20 DIAGNOSIS — I130 Hypertensive heart and chronic kidney disease with heart failure and stage 1 through stage 4 chronic kidney disease, or unspecified chronic kidney disease: Secondary | ICD-10-CM | POA: Diagnosis not present

## 2017-05-20 DIAGNOSIS — I480 Paroxysmal atrial fibrillation: Secondary | ICD-10-CM | POA: Diagnosis not present

## 2017-05-20 DIAGNOSIS — N183 Chronic kidney disease, stage 3 (moderate): Secondary | ICD-10-CM | POA: Diagnosis not present

## 2017-05-21 ENCOUNTER — Telehealth: Payer: Self-pay | Admitting: Cardiology

## 2017-05-21 NOTE — Telephone Encounter (Signed)
Patient called and stated that her device vibrated. Instructed pt to send a remote transmission and once the transmission is received someone will review and call her back. Pt verbalized understanding.

## 2017-05-21 NOTE — Telephone Encounter (Signed)
Informed pt that her device has reached ERI pt agreeable to apt with Dr. Lovena Le on 05/31/17 at 2:00pm to discuss generator change.

## 2017-05-25 DIAGNOSIS — J438 Other emphysema: Secondary | ICD-10-CM | POA: Diagnosis not present

## 2017-05-28 ENCOUNTER — Encounter: Payer: Self-pay | Admitting: Internal Medicine

## 2017-05-31 ENCOUNTER — Encounter: Payer: Self-pay | Admitting: Internal Medicine

## 2017-05-31 ENCOUNTER — Ambulatory Visit: Payer: Medicare HMO | Admitting: Internal Medicine

## 2017-05-31 VITALS — BP 140/82 | HR 70 | Ht 66.0 in | Wt 207.0 lb

## 2017-05-31 DIAGNOSIS — I5022 Chronic systolic (congestive) heart failure: Secondary | ICD-10-CM

## 2017-05-31 DIAGNOSIS — I480 Paroxysmal atrial fibrillation: Secondary | ICD-10-CM

## 2017-05-31 DIAGNOSIS — Z95810 Presence of automatic (implantable) cardiac defibrillator: Secondary | ICD-10-CM

## 2017-05-31 NOTE — Progress Notes (Signed)
HPI Emily Abbott returns for ongoing ICD followup. She is a pleasant 77yo woman with a h/o CAD, s/p MI, chronic systolic heart failure, s/p BiV ICD. She has been stable. She has occaisional palpitations. She notes dyspnea with exertion. Mild peripheral edema which she treats with diuretic therapy. No ICD shocks. Her blood pressure has been under bettercontrol. She has a h/o PAF and has been noted to have increasingly frequent episodes despite medical therapy with dofetilide. She denies chest pain. She has required adjustment of her diuretic dose due to her propensity for volume overload. her EF had normalized by echo 2.5 years ago. She has never had VT  Allergies  Allergen Reactions  . Sulfonamide Derivatives     Unknown     Current Outpatient Medications  Medication Sig Dispense Refill  . albuterol (PROAIR HFA) 108 (90 Base) MCG/ACT inhaler Inhale 1-2 puffs into the lungs every 6 (six) hours as needed for wheezing or shortness of breath. 18 g 5  . ALPRAZolam (XANAX) 0.5 MG tablet Take 0.5 mg by mouth 2 (two) times daily as needed.     Marland Kitchen amLODipine (NORVASC) 5 MG tablet Take 5 mg by mouth daily.    Marland Kitchen atorvastatin (LIPITOR) 20 MG tablet Take 1 tablet by mouth daily.  1  . BREO ELLIPTA 100-25 MCG/INH AEPB INHALE 1 PUFF DAILY 60 each 0  . carvedilol (COREG) 6.25 MG tablet Take 6.25 mg by mouth 2 (two) times daily with a meal.    . Cyanocobalamin (VITAMIN B12 PO) Take 1 tablet by mouth daily.    Marland Kitchen dofetilide (TIKOSYN) 250 MCG capsule TAKE ONE CAPSULE BY MOUTH TWICE A DAY 60 capsule 11  . ezetimibe (ZETIA) 10 MG tablet Take 10 mg by mouth daily.    . ferrous sulfate 325 (65 FE) MG tablet Take 1 tablet by mouth daily. Reported on 07/12/2015  5  . furosemide (LASIX) 20 MG tablet TAKE 2 TABLETS BY MOUTH EVERY DAY 180 tablet 2  . glucosamine-chondroitin 500-400 MG tablet Take 1 tablet by mouth daily. Reported on 07/12/2015    . HYDROcodone-acetaminophen (VICODIN) 5-500 MG per tablet Take 1  tablet by mouth as directed.     . linagliptin (TRADJENTA) 5 MG TABS tablet Take 5 mg by mouth daily.    . magnesium gluconate (MAGONATE) 500 MG tablet Take 500 mg by mouth daily. Reported on 07/12/2015    . omeprazole (PRILOSEC) 40 MG capsule Take 40 mg by mouth daily.      Marland Kitchen PARoxetine (PAXIL) 30 MG tablet Take 30 mg by mouth daily.      Marland Kitchen tiZANidine (ZANAFLEX) 2 MG tablet Take by mouth every 6 (six) hours as needed for muscle spasms.    . Vitamin D, Ergocalciferol, (DRISDOL) 50000 units CAPS capsule Take 50,000 Units by mouth every 7 (seven) days.    Marland Kitchen warfarin (COUMADIN) 5 MG tablet Use as directed by Alta Corning, MD     . zolpidem (AMBIEN) 10 MG tablet Take 5 mg by mouth at bedtime as needed for sleep.      No current facility-administered medications for this visit.      Past Medical History:  Diagnosis Date  . Anemia   . Anxiety   . CAD (coronary artery disease)   . COPD (chronic obstructive pulmonary disease) ([place])   . Diabetes mellitus    type II  . GERD (gastroesophageal reflux disease)   . HTN (hypertension)    essential  . Hyperlipidemia, mixed   .  Mitral valve disorder   . Osteoarthritis   . Panic attack   . Paroxysmal atrial fibrillation (HCC)   . Pulmonary embolism (HCC)     ROS:   All systems reviewed and negative except as noted in the HPI.   Past Surgical History:  Procedure Laterality Date  . BIOPSY BREAST    . BTL    . CARDIAC VALVE SURGERY     mitral  . carpal tennel  2003     Family History  Problem Relation Age of Onset  . Stroke Mother   . Anuerysm Father   . Diabetes Sister   . Cancer Brother   . Ulcers Sister      Social History   Socioeconomic History  . Marital status: Widowed    Spouse name: Not on file  . Number of children: Not on file  . Years of education: Not on file  . Highest education level: Not on file  Occupational History  . Occupation: reitred  Social Needs  . Financial resource strain: Not on file  . Food  insecurity:    Worry: Not on file    Inability: Not on file  . Transportation needs:    Medical: Not on file    Non-medical: Not on file  Tobacco Use  . Smoking status: Former Smoker    Packs/day: 1.00    Years: 50.00    Pack years: 50.00    Types: Cigarettes    Last attempt to quit: 02/19/2001    Years since quitting: 16.2  . Smokeless tobacco: Never Used  Substance and Sexual Activity  . Alcohol use: No    Alcohol/week: 0.0 oz  . Drug use: Never  . Sexual activity: Not on file  Lifestyle  . Physical activity:    Days per week: Not on file    Minutes per session: Not on file  . Stress: Not on file  Relationships  . Social connections:    Talks on phone: Not on file    Gets together: Not on file    Attends religious service: Not on file    Active member of club or organization: Not on file    Attends meetings of clubs or organizations: Not on file    Relationship status: Not on file  . Intimate partner violence:    Fear of current or ex partner: Not on file    Emotionally abused: Not on file    Physically abused: Not on file    Forced sexual activity: Not on file  Other Topics Concern  . Not on file  Social History Narrative  . Not on file     BP 140/82   Pulse 70   Ht 5\' 6"  (1.676 m)   Wt 207 lb (93.9 kg)   BMI 33.41 kg/m   Physical Exam:  Well appearing 77 yo man, NAD HEENT: Unremarkable Neck:  6 cm JVD, no thyromegally Lymphatics:  No adenopathy Back:  No CVA tenderness Lungs:  Clear with no wheezes HEART:  Regular rate rhythm, no murmurs, no rubs, no clicks Abd:  soft, positive bowel sounds, no organomegally, no rebound, no guarding Ext:  2 plus pulses, no edema, no cyanosis, no clubbing Skin:  No rashes no nodules Neuro:  CN II through XII intact, motor grossly intact  EKG - NSR with biv pacing  DEVICE  Normal device function.  See PaceArt for details.   Assess/Plan: 1. Chronic systolic heart failure - her EF has normalized. I have  recommended  we proceed with gen change to a biv PPM. She will continue her current meds. 2. HTN - she is encouraged to maintain a low sodium diet. 3. ICD - her leads are working normally.  4. Dyslipidemia - she has had some muscle aches with her statin therapy and it is on hold.   Mikle Bosworth.D.

## 2017-05-31 NOTE — H&P (View-Only) (Signed)
HPI [name] returns for ongoing ICD followup. She is a pleasant 77yo woman with a h/o CAD, s/p MI, chronic systolic heart failure, s/p BiV ICD. She has been stable. She has occaisional palpitations. She notes dyspnea with exertion. Mild peripheral edema which she treats with diuretic therapy. No ICD shocks. Her blood pressure has been under bettercontrol. She has a h/o PAF and has been noted to have increasingly frequent episodes despite medical therapy with dofetilide. She denies chest pain. She has required adjustment of her diuretic dose due to her propensity for volume overload. her EF had normalized by echo 2.5 years ago. She has never had VT  Allergies  Allergen Reactions  . Sulfonamide Derivatives     Unknown     Current Outpatient Medications  Medication Sig Dispense Refill  . albuterol (PROAIR HFA) 108 (90 Base) MCG/ACT inhaler Inhale 1-2 puffs into the lungs every 6 (six) hours as needed for wheezing or shortness of breath. 18 g 5  . ALPRAZolam (XANAX) 0.5 MG tablet Take 0.5 mg by mouth 2 (two) times daily as needed.     Marland Kitchen amLODipine (NORVASC) 5 MG tablet Take 5 mg by mouth daily.    Marland Kitchen atorvastatin (LIPITOR) 20 MG tablet Take 1 tablet by mouth daily.  1  . BREO ELLIPTA 100-25 MCG/INH AEPB INHALE 1 PUFF DAILY 60 each 0  . carvedilol (COREG) 6.25 MG tablet Take 6.25 mg by mouth 2 (two) times daily with a meal.    . Cyanocobalamin (VITAMIN B12 PO) Take 1 tablet by mouth daily.    Marland Kitchen dofetilide (TIKOSYN) 250 MCG capsule TAKE ONE CAPSULE BY MOUTH TWICE A DAY 60 capsule 11  . ezetimibe (ZETIA) 10 MG tablet Take 10 mg by mouth daily.    . ferrous sulfate 325 (65 FE) MG tablet Take 1 tablet by mouth daily. Reported on 07/12/2015  5  . furosemide (LASIX) 20 MG tablet TAKE 2 TABLETS BY MOUTH EVERY DAY 180 tablet 2  . glucosamine-chondroitin 500-400 MG tablet Take 1 tablet by mouth daily. Reported on 07/12/2015    . HYDROcodone-acetaminophen (VICODIN) 5-500 MG per tablet Take 1  tablet by mouth as directed.     . linagliptin (TRADJENTA) 5 MG TABS tablet Take 5 mg by mouth daily.    . magnesium gluconate (MAGONATE) 500 MG tablet Take 500 mg by mouth daily. Reported on 07/12/2015    . omeprazole (PRILOSEC) 40 MG capsule Take 40 mg by mouth daily.      Marland Kitchen PARoxetine (PAXIL) 30 MG tablet Take 30 mg by mouth daily.      Marland Kitchen tiZANidine (ZANAFLEX) 2 MG tablet Take by mouth every 6 (six) hours as needed for muscle spasms.    . Vitamin D, Ergocalciferol, (DRISDOL) 50000 units CAPS capsule Take 50,000 Units by mouth every 7 (seven) days.    Marland Kitchen warfarin (COUMADIN) 5 MG tablet Use as directed by Alta Corning, MD     . zolpidem (AMBIEN) 10 MG tablet Take 5 mg by mouth at bedtime as needed for sleep.      No current facility-administered medications for this visit.      Past Medical History:  Diagnosis Date  . Anemia   . Anxiety   . CAD (coronary artery disease)   . COPD (chronic obstructive pulmonary disease) ([place])   . Diabetes mellitus    type II  . GERD (gastroesophageal reflux disease)   . HTN (hypertension)    essential  . Hyperlipidemia, mixed   .  Mitral valve disorder   . Osteoarthritis   . Panic attack   . Paroxysmal atrial fibrillation (HCC)   . Pulmonary embolism (H[facility])     ROS:   All systems reviewed and negative except as noted in the HPI.   Past Surgical History:  Procedure Laterality Date  . BIOPSY BREAST    . BTL    . CARDIAC VALVE SURGERY     mitral  . carpal tennel  2003     Family History  Problem Relation Age of Onset  . Stroke Mother   . Anuerysm Father   . Diabetes Sister   . Cancer Brother   . Ulcers Sister      Social History   Socioeconomic History  . Marital status: Widowed    Spouse name: Not on file  . Number of children: Not on file  . Years of education: Not on file  . Highest education level: Not on file  Occupational History  . Occupation: reitred  Social Needs  . Financial resource strain: Not on file  . Food  insecurity:    Worry: Not on file    Inability: Not on file  . Transportation needs:    Medical: Not on file    Non-medical: Not on file  Tobacco Use  . Smoking status: Former Smoker    Packs/day: 1.00    Years: 50.00    Pack years: 50.00    Types: Cigarettes    Last attempt to quit: 02/19/2001    Years since quitting: 16.2  . Smokeless tobacco: Never Used  Substance and Sexual Activity  . Alcohol use: No    Alcohol/week: 0.0 oz  . Drug use: Never  . Sexual activity: Not on file  Lifestyle  . Physical activity:    Days per week: Not on file    Minutes per session: Not on file  . Stress: Not on file  Relationships  . Social connections:    Talks on phone: Not on file    Gets together: Not on file    Attends religious service: Not on file    Active member of club or organization: Not on file    Attends meetings of clubs or organizations: Not on file    Relationship status: Not on file  . Intimate partner violence:    Fear of current or ex partner: Not on file    Emotionally abused: Not on file    Physically abused: Not on file    Forced sexual activity: Not on file  Other Topics Concern  . Not on file  Social History Narrative  . Not on file     BP 140/82   Pulse 70   Ht 5\' 6"  (1.676 m)   Wt 207 lb (93.9 kg)   BMI 33.41 kg/m   Physical Exam:  Well appearing 77 yo man, NAD HEENT: Unremarkable Neck:  6 cm JVD, no thyromegally Lymphatics:  No adenopathy Back:  No CVA tenderness Lungs:  Clear with no wheezes HEART:  Regular rate rhythm, no murmurs, no rubs, no clicks Abd:  soft, positive bowel sounds, no organomegally, no rebound, no guarding Ext:  2 plus pulses, no edema, no cyanosis, no clubbing Skin:  No rashes no nodules Neuro:  CN II through XII intact, motor grossly intact  EKG - NSR with biv pacing  DEVICE  Normal device function.  See PaceArt for details.   Assess/Plan: 1. Chronic systolic heart failure - her EF has normalized. I have  recommended  we proceed with gen change to a biv PPM. She will continue her current meds. 2. HTN - she is encouraged to maintain a low sodium diet. 3. ICD - her leads are working normally.  4. Dyslipidemia - she has had some muscle aches with her statin therapy and it is on hold.   Mikle Bosworth.D.

## 2017-05-31 NOTE — Patient Instructions (Addendum)
Medication Instructions:  Your physician recommends that you continue on your current medications as directed. Please refer to the Current Medication list given to you today.  Labwork: You will get lab work today:  BMP and CBC and PT/INR  Testing/Procedures: Your physician has recommended that you have your generator to your device replaced.  Follow-Up: You will follow up with device clinic 10-14 days after your procedure for a wound check.  You will follow up with Dr. Lovena Le 91 days after your procedure.  Any Other Special Instructions Will Be Listed Below (If Applicable).  Please arrive at the [facility] main entrance of [facility] hospital at:  8:00 am on June 06, 2017 Use the CHG surgical scrub as directed Do not eat or drink after midnight prior to procedure DO NOT take your WARFARIN for 2 days prior to your procedure.  Your last dose of WARFARIN will be June 03, 2017. On the morning of your procedure take all of your normal morning medications EXCEPT FOR:  LASIX and TRADJENTA. You will be discharged after your procedure You will need someone to drive you home at discharge  If you need a refill on your cardiac medications before your next appointment, please call your pharmacy.

## 2017-06-01 LAB — CBC WITH DIFFERENTIAL/PLATELET
Basophils Absolute: 0.1 10*3/uL (ref 0.0–0.2)
Basos: 1 %
EOS (ABSOLUTE): 0.2 10*3/uL (ref 0.0–0.4)
Eos: 3 %
Hematocrit: 35.6 % (ref 34.0–46.6)
Hemoglobin: 11.2 g/dL (ref 11.1–15.9)
Immature Grans (Abs): 0 10*3/uL (ref 0.0–0.1)
Immature Granulocytes: 0 %
Lymphocytes Absolute: 1.3 10*3/uL (ref 0.7–3.1)
Lymphs: 15 %
MCH: 25.6 pg — ABNORMAL LOW (ref 26.6–33.0)
MCHC: 31.5 g/dL (ref 31.5–35.7)
MCV: 82 fL (ref 79–97)
Monocytes Absolute: 0.9 10*3/uL (ref 0.1–0.9)
Monocytes: 11 %
Neutrophils Absolute: 6.3 10*3/uL (ref 1.4–7.0)
Neutrophils: 70 %
Platelets: 276 10*3/uL (ref 150–379)
RBC: 4.37 x10E6/uL (ref 3.77–5.28)
RDW: 15.9 % — ABNORMAL HIGH (ref 12.3–15.4)
WBC: 8.9 10*3/uL (ref 3.4–10.8)

## 2017-06-01 LAB — BASIC METABOLIC PANEL
BUN/Creatinine Ratio: 9 — ABNORMAL LOW (ref 12–28)
BUN: 13 mg/dL (ref 8–27)
CO2: 24 mmol/L (ref 20–29)
Calcium: 10 mg/dL (ref 8.7–10.3)
Chloride: 102 mmol/L (ref 96–106)
Creatinine, Ser: 1.37 mg/dL — ABNORMAL HIGH (ref 0.57–1.00)
GFR calc Af Amer: 43 mL/min/{1.73_m2} — ABNORMAL LOW (ref >59)
GFR calc non Af Amer: 37 mL/min/{1.73_m2} — ABNORMAL LOW (ref >59)
Glucose: 174 mg/dL — ABNORMAL HIGH (ref 65–99)
Potassium: 4.7 mmol/L (ref 3.5–5.2)
Sodium: 140 mmol/L (ref 134–144)

## 2017-06-01 LAB — PROTIME-INR
INR: 2.3 — ABNORMAL HIGH (ref 0.8–1.2)
Prothrombin Time: 23 s — ABNORMAL HIGH (ref 9.1–12.0)

## 2017-06-03 ENCOUNTER — Other Ambulatory Visit: Payer: Self-pay

## 2017-06-03 DIAGNOSIS — R69 Illness, unspecified: Secondary | ICD-10-CM | POA: Diagnosis not present

## 2017-06-03 DIAGNOSIS — I5022 Chronic systolic (congestive) heart failure: Secondary | ICD-10-CM

## 2017-06-03 LAB — CUP PACEART INCLINIC DEVICE CHECK
Brady Statistic RA Percent Paced: 20 %
Date Time Interrogation Session: 20190415135550
HighPow Impedance: 63 Ohm
Implantable Lead Implant Date: 20110127
Implantable Lead Implant Date: 20110127
Implantable Lead Location: 753858
Implantable Lead Location: 753859
Implantable Lead Location: 753860
Implantable Pulse Generator Implant Date: 20110127
Lead Channel Impedance Value: 1075 Ohm
Lead Channel Impedance Value: 390 Ohm
Lead Channel Impedance Value: 460 Ohm
Lead Channel Pacing Threshold Amplitude: 0.75 V
Lead Channel Pacing Threshold Amplitude: 0.75 V
Lead Channel Pacing Threshold Pulse Width: 0.5 ms
Lead Channel Pacing Threshold Pulse Width: 0.5 ms
MDC IDC LEAD IMPLANT DT: 20110127
MDC IDC MSMT LEADCHNL LV PACING THRESHOLD PULSEWIDTH: 0.5 ms
MDC IDC MSMT LEADCHNL RA PACING THRESHOLD AMPLITUDE: 0.75 V
MDC IDC MSMT LEADCHNL RA SENSING INTR AMPL: 2.7 mV
MDC IDC MSMT LEADCHNL RV SENSING INTR AMPL: 11.9 mV
MDC IDC PG SERIAL: 752248
MDC IDC STAT BRADY RV PERCENT PACED: 93 %

## 2017-06-06 ENCOUNTER — Ambulatory Visit (HOSPITAL_COMMUNITY)
Admission: RE | Admit: 2017-06-06 | Discharge: 2017-06-06 | Disposition: A | Discharge status: Home / Self Care | Payer: Medicare HMO | Source: Ambulatory Visit | Attending: Internal Medicine | Admitting: Internal Medicine

## 2017-06-06 ENCOUNTER — Encounter (HOSPITAL_COMMUNITY)
Admission: RE | Disposition: A | Discharge status: Home / Self Care | Payer: Self-pay | Source: Ambulatory Visit | Attending: Internal Medicine

## 2017-06-06 ENCOUNTER — Encounter (HOSPITAL_COMMUNITY): Payer: Self-pay | Admitting: Internal Medicine

## 2017-06-06 DIAGNOSIS — E119 Type 2 diabetes mellitus without complications: Secondary | ICD-10-CM | POA: Insufficient documentation

## 2017-06-06 DIAGNOSIS — Z86711 Personal history of pulmonary embolism: Secondary | ICD-10-CM | POA: Diagnosis not present

## 2017-06-06 DIAGNOSIS — I447 Left bundle-branch block, unspecified: Secondary | ICD-10-CM | POA: Diagnosis not present

## 2017-06-06 DIAGNOSIS — Z87891 Personal history of nicotine dependence: Secondary | ICD-10-CM | POA: Diagnosis not present

## 2017-06-06 DIAGNOSIS — F410 Panic disorder [episodic paroxysmal anxiety] without agoraphobia: Secondary | ICD-10-CM | POA: Insufficient documentation

## 2017-06-06 DIAGNOSIS — I480 Paroxysmal atrial fibrillation: Secondary | ICD-10-CM | POA: Diagnosis not present

## 2017-06-06 DIAGNOSIS — Z4502 Encounter for adjustment and management of automatic implantable cardiac defibrillator: Secondary | ICD-10-CM | POA: Diagnosis present

## 2017-06-06 DIAGNOSIS — I5022 Chronic systolic (congestive) heart failure: Secondary | ICD-10-CM | POA: Diagnosis present

## 2017-06-06 DIAGNOSIS — J449 Chronic obstructive pulmonary disease, unspecified: Secondary | ICD-10-CM | POA: Insufficient documentation

## 2017-06-06 DIAGNOSIS — I110 Hypertensive heart disease with heart failure: Secondary | ICD-10-CM | POA: Insufficient documentation

## 2017-06-06 DIAGNOSIS — Z7901 Long term (current) use of anticoagulants: Secondary | ICD-10-CM | POA: Insufficient documentation

## 2017-06-06 DIAGNOSIS — Z882 Allergy status to sulfonamides status: Secondary | ICD-10-CM | POA: Diagnosis not present

## 2017-06-06 DIAGNOSIS — K219 Gastro-esophageal reflux disease without esophagitis: Secondary | ICD-10-CM | POA: Insufficient documentation

## 2017-06-06 DIAGNOSIS — M1990 Unspecified osteoarthritis, unspecified site: Secondary | ICD-10-CM | POA: Diagnosis not present

## 2017-06-06 DIAGNOSIS — E782 Mixed hyperlipidemia: Secondary | ICD-10-CM | POA: Insufficient documentation

## 2017-06-06 DIAGNOSIS — I252 Old myocardial infarction: Secondary | ICD-10-CM | POA: Insufficient documentation

## 2017-06-06 DIAGNOSIS — I2510 Atherosclerotic heart disease of native coronary artery without angina pectoris: Secondary | ICD-10-CM | POA: Diagnosis not present

## 2017-06-06 DIAGNOSIS — Z95810 Presence of automatic (implantable) cardiac defibrillator: Secondary | ICD-10-CM | POA: Diagnosis present

## 2017-06-06 HISTORY — PX: BIV ICD GENERATOR CHANGEOUT: EP1194

## 2017-06-06 LAB — SURGICAL PCR SCREEN
MRSA, PCR: NEGATIVE
Staphylococcus aureus: NEGATIVE

## 2017-06-06 LAB — PROTIME-INR
INR: 1.6
PROTHROMBIN TIME: 18.9 s — AB (ref 11.4–15.2)

## 2017-06-06 LAB — GLUCOSE, CAPILLARY: Glucose-Capillary: 172 mg/dL — ABNORMAL HIGH (ref 65–99)

## 2017-06-06 SURGERY — BIV ICD GENERATOR CHANGEOUT

## 2017-06-06 MED ORDER — ONDANSETRON HCL 4 MG/2ML IJ SOLN: 4.0000 mg | Freq: Four times a day (QID) | INTRAMUSCULAR | Status: DC | PRN

## 2017-06-06 MED ORDER — MUPIROCIN 2 % EX OINT
TOPICAL_OINTMENT | CUTANEOUS | Status: AC
  Administered 2017-06-06: 1 via TOPICAL
  Filled 2017-06-06: qty 22

## 2017-06-06 MED ORDER — FENTANYL CITRATE (PF) 100 MCG/2ML IJ SOLN
INTRAMUSCULAR | Status: DC | PRN
  Administered 2017-06-06 (×4): 12.5 ug via INTRAVENOUS

## 2017-06-06 MED ORDER — SODIUM CHLORIDE 0.9 % IV SOLN: 80.0000 mg | INTRAVENOUS | Status: DC

## 2017-06-06 MED ORDER — SODIUM CHLORIDE 0.9 % IV SOLN
INTRAVENOUS | Status: DC
  Administered 2017-06-06: 09:00:00 via INTRAVENOUS

## 2017-06-06 MED ORDER — SODIUM CHLORIDE 0.9 % IV SOLN
INTRAVENOUS | Status: AC
  Filled 2017-06-06: qty 2

## 2017-06-06 MED ORDER — MIDAZOLAM HCL 5 MG/5ML IJ SOLN
INTRAMUSCULAR | Status: AC
  Filled 2017-06-06: qty 5

## 2017-06-06 MED ORDER — ACETAMINOPHEN 325 MG PO TABS: 325.0000 mg | ORAL_TABLET | ORAL | Status: DC | PRN

## 2017-06-06 MED ORDER — CHLORHEXIDINE GLUCONATE 4 % EX LIQD: 60.0000 mL | Freq: Once | CUTANEOUS | Status: DC

## 2017-06-06 MED ORDER — CEFAZOLIN SODIUM-DEXTROSE 2-4 GM/100ML-% IV SOLN
2.0000 g | INTRAVENOUS | Status: AC
  Administered 2017-06-06: 2 g via INTRAVENOUS

## 2017-06-06 MED ORDER — LIDOCAINE HCL (PF) 1 % IJ SOLN
INTRAMUSCULAR | Status: AC
  Filled 2017-06-06: qty 60

## 2017-06-06 MED ORDER — CEFAZOLIN SODIUM-DEXTROSE 2-4 GM/100ML-% IV SOLN
INTRAVENOUS | Status: AC
  Filled 2017-06-06: qty 100

## 2017-06-06 MED ORDER — MIDAZOLAM HCL 5 MG/5ML IJ SOLN
INTRAMUSCULAR | Status: DC | PRN
  Administered 2017-06-06 (×4): 1 mg via INTRAVENOUS

## 2017-06-06 MED ORDER — FENTANYL CITRATE (PF) 100 MCG/2ML IJ SOLN
INTRAMUSCULAR | Status: AC
  Filled 2017-06-06: qty 2

## 2017-06-06 MED ORDER — LIDOCAINE HCL (PF) 1 % IJ SOLN
INTRAMUSCULAR | Status: DC | PRN
  Administered 2017-06-06: 40 mL via SUBCUTANEOUS

## 2017-06-06 MED ORDER — MUPIROCIN 2 % EX OINT
1.0000 "application " | TOPICAL_OINTMENT | Freq: Once | CUTANEOUS | Status: AC
  Administered 2017-06-06: 1 via TOPICAL

## 2017-06-06 SURGICAL SUPPLY — 5 items
CABLE SURGICAL S-101-97-12 (CABLE) IMPLANT
PACEMAKER ALLR CRT-P RF PM3222 (Pacemaker) ×1 IMPLANT
PAD DEFIB LIFELINK (PAD) IMPLANT
PPM ALLURE CRT-P RF PM3222 (Pacemaker) ×2 IMPLANT
TRAY PACEMAKER INSERTION (PACKS) IMPLANT

## 2017-06-06 NOTE — Interval H&P Note (Signed)
History and Physical Interval Note:  06/06/2017 10:23 AM  Emily Abbott  has presented today for surgery, with the diagnosis of eri  The various methods of treatment have been discussed with the patient and family. After consideration of risks, benefits and other options for treatment, the patient has consented to  Procedure(s): BIV ICD [place] (N/A) as a surgical intervention .  The patient's history has been reviewed, patient examined, no change in status, stable for surgery.  I have reviewed the patient's chart and labs.  Questions were answered to the patient's satisfaction.     Cristopher Peru

## 2017-06-06 NOTE — Discharge Instructions (Signed)

## 2017-06-06 NOTE — Progress Notes (Signed)
Responded to Short Stay unit page to support patient and assist with completion of AD.  AD was complete and copies made for agents and patient chart.  Daughter at bedside supporting patient.  Provided emotional support. Chaplain available as needed.  Jaclynn Major, [place], [facility], Pager [phone]

## 2017-06-06 NOTE — Progress Notes (Signed)
   06/06/17 0100  Clinical Encounter Type  Visited With Patient and family together;Health care provider  Visit Type Initial;Spiritual support;Pre-op  Referral From Nurse  Spiritual Encounters  Spiritual Needs Literature;Prayer;Emotional  Stress Factors  Patient Stress Factors None identified  Family Stress Factors None identified  Advance Directives (For Healthcare)  Type of Advance Directive Healthcare Power of [place];Living will  Copy of [place] in Chart? Yes  Copy of Living Will in Chart? Yes

## 2017-06-07 MED FILL — Gentamicin Sulfate Inj 40 MG/ML: INTRAMUSCULAR | Qty: 80 | Status: AC

## 2017-06-13 NOTE — Progress Notes (Signed)
PPM implant 06-06-17   LOV cardiology Dr Crissie Sickles 05-31-17 epic   EKG 05-31-17 epic   cbcdiff 05-31-17, bmp 06-03-17, PTINR 06-06-17, MRSA PCR 06-06-17  Epic   LOV with labs  Dr Caren Griffins white 05-20-17 on chart   CT Chest 11-08-16 epic

## 2017-06-13 NOTE — Patient Instructions (Addendum)
Emily Abbott  06/13/2017   Your procedure is scheduled on: 06-17-17   Report to [facility] Main  Entrance    Report to admitting at 5:30AM    Call this number if you have problems the morning of surgery [phone]     Remember: Do not eat food or drink liquids :After Midnight.       Take these medicines the morning of surgery with A SIP OF WATER: TIKOSYN, CARVEDILOL, AMLODIPINE, EZETIMIBE, PAROXETINE, PRO AIR INHALER(PLEASE BRING), OMEPRAZOLE                                  You may not have any metal on your body including hair pins and              piercings  Do not wear jewelry, make-up, lotions, powders or perfumes, deodorant             Do not wear nail polish.  Do not shave  48 hours prior to surgery.         Do not bring valuables to the hospital. [name].  Contacts, dentures or bridgework may not be worn into surgery.  Leave suitcase in the car. After surgery it may be brought to your room.                Please read over the following fact sheets you were given: _____________________________________________________________________             How to Manage Your Diabetes Before and After Surgery  Why is it important to control my blood sugar before and after surgery? . Improving blood sugar levels before and after surgery helps healing and can limit problems. . A way of improving blood sugar control is eating a healthy diet by: o  Eating less sugar and carbohydrates o  Increasing activity/exercise o  Talking with your doctor about reaching your blood sugar goals . High blood sugars (greater than 180 mg/dL) can raise your risk of infections and slow your recovery, so you will need to focus on controlling your diabetes during the weeks before surgery. . Make sure that the doctor who takes care of your diabetes knows about your planned surgery including the date and location.  How do  I manage my blood sugar before surgery? . Check your blood sugar at least 4 times a day, starting 2 days before surgery, to make sure that the level is not too high or low. o Check your blood sugar the morning of your surgery when you wake up and every 2 hours until you get to the Short Stay unit. . If your blood sugar is less than 70 mg/dL, you will need to treat for low blood sugar: o Do not take insulin. o Treat a low blood sugar (less than 70 mg/dL) with  cup of clear juice (cranberry or apple), 4 glucose tablets, OR glucose gel. o Recheck blood sugar in 15 minutes after treatment (to make sure it is greater than 70 mg/dL). If your blood sugar is not greater than 70 mg/dL on recheck, call [phone] for further instructions. . Report your blood sugar to the short stay nurse when you get to Short Stay.  . If you are  admitted to the hospital after surgery: o Your blood sugar will be checked by the staff and you will probably be given insulin after surgery (instead of oral diabetes medicines) to make sure you have good blood sugar levels. o The goal for blood sugar control after surgery is 80-180 mg/dL.   WHAT DO I DO ABOUT MY DIABETES MEDICATION?   . THE DAY BEFORE SURGERY o TAKE MORNING AND AFTERNOON DOSES OF GLIMEPIRIDE. DO NOT TAKE THE EVENING DOSE! o PLEASE ASK YOUR HEALTH CAR PROVIDER FOR TRADJENTA INSTRUCTIONS!      . THE MORNING OF SURGERY,  o DO NOT TAKE GLIMEPIRIDE! o PLEASE ASK YOUR HEALTH CAR PROVIDER FOR TRADJENTA INSTRUCTIONS!    Patient Signature:  Date:   Nurse Signature:  Date:   Reviewed and Endorsed by [facility] Patient Education Committee, August 2015   [facility] - Preparing for Surgery Before surgery, you can play an important role.  Because skin is not sterile, your skin needs to be as free of germs as possible.  You can reduce the number of germs on your skin by washing with CHG (chlorahexidine gluconate) soap before surgery.  CHG is an antiseptic  cleaner which kills germs and bonds with the skin to continue killing germs even after washing. Please DO NOT use if you have an allergy to CHG or antibacterial soaps.  If your skin becomes reddened/irritated stop using the CHG and inform your nurse when you arrive at Short Stay. Do not shave (including legs and underarms) for at least 48 hours prior to the first CHG shower.  You may shave your face/neck. Please follow these instructions carefully:  1.  Shower with CHG Soap the night before surgery and the  morning of Surgery.  2.  If you choose to wash your hair, wash your hair first as usual with your  normal  shampoo.  3.  After you shampoo, rinse your hair and body thoroughly to remove the  shampoo.                           4.  Use CHG as you would any other liquid soap.  You can apply chg directly  to the skin and wash                       Gently with a scrungie or clean washcloth.  5.  Apply the CHG Soap to your body ONLY FROM THE NECK DOWN.   Do not use on face/ open                           Wound or open sores. Avoid contact with eyes, ears mouth and genitals (private parts).                       Wash face,  Genitals (private parts) with your normal soap.             6.  Wash thoroughly, paying special attention to the area where your surgery  will be performed.  7.  Thoroughly rinse your body with warm water from the neck down.  8.  DO NOT shower/wash with your normal soap after using and rinsing off  the CHG Soap.                9.  Pat yourself dry with a clean towel.  10.  Wear clean pajamas.            11.  Place clean sheets on your bed the night of your first shower and do not  sleep with pets. Day of Surgery : Do not apply any lotions/deodorants the morning of surgery.  Please wear clean clothes to the hospital/surgery center.  FAILURE TO FOLLOW THESE INSTRUCTIONS MAY RESULT IN THE CANCELLATION OF YOUR SURGERY PATIENT  SIGNATURE_________________________________  NURSE SIGNATURE__________________________________  ________________________________________________________________________

## 2017-06-14 ENCOUNTER — Other Ambulatory Visit: Payer: Self-pay

## 2017-06-14 ENCOUNTER — Encounter (HOSPITAL_COMMUNITY): Payer: Self-pay

## 2017-06-14 ENCOUNTER — Encounter (HOSPITAL_COMMUNITY)
Admission: RE | Admit: 2017-06-14 | Discharge: 2017-06-14 | Disposition: A | Discharge status: Home / Self Care | Payer: Medicare HMO | Source: Ambulatory Visit | Attending: Surgery | Admitting: Surgery

## 2017-06-14 DIAGNOSIS — E213 Hyperparathyroidism, unspecified: Secondary | ICD-10-CM | POA: Diagnosis not present

## 2017-06-14 DIAGNOSIS — Z01812 Encounter for preprocedural laboratory examination: Secondary | ICD-10-CM | POA: Diagnosis not present

## 2017-06-14 DIAGNOSIS — E119 Type 2 diabetes mellitus without complications: Secondary | ICD-10-CM | POA: Diagnosis not present

## 2017-06-14 DIAGNOSIS — Z01810 Encounter for preprocedural cardiovascular examination: Secondary | ICD-10-CM | POA: Diagnosis not present

## 2017-06-14 HISTORY — DX: Pneumonia, unspecified organism: J18.9

## 2017-06-14 HISTORY — DX: Chronic kidney disease, unspecified: N18.9

## 2017-06-14 HISTORY — DX: Sleep apnea, unspecified: G47.30

## 2017-06-14 LAB — GLUCOSE, CAPILLARY: Glucose-Capillary: 181 mg/dL — ABNORMAL HIGH (ref 65–99)

## 2017-06-14 LAB — BASIC METABOLIC PANEL
ANION GAP: 10 (ref 5–15)
BUN: 19 mg/dL (ref 6–20)
CHLORIDE: 106 mmol/L (ref 101–111)
CO2: 26 mmol/L (ref 22–32)
Calcium: 10.1 mg/dL (ref 8.9–10.3)
Creatinine, Ser: 1.31 mg/dL — ABNORMAL HIGH (ref 0.44–1.00)
GFR calc Af Amer: 44 mL/min — ABNORMAL LOW (ref >60)
GFR, EST NON AFRICAN AMERICAN: 38 mL/min — AB (ref >60)
GLUCOSE: 148 mg/dL — AB (ref 65–99)
POTASSIUM: 5.1 mmol/L (ref 3.5–5.1)
Sodium: 142 mmol/L (ref 135–145)

## 2017-06-14 LAB — CBC WITH DIFFERENTIAL/PLATELET
BASOS ABS: 0.1 10*3/uL (ref 0.0–0.1)
Basophils Relative: 1 %
EOS PCT: 2 %
Eosinophils Absolute: 0.2 10*3/uL (ref 0.0–0.7)
HEMATOCRIT: 35.2 % — AB (ref 36.0–46.0)
Hemoglobin: 10.8 g/dL — ABNORMAL LOW (ref 12.0–15.0)
LYMPHS ABS: 1.9 10*3/uL (ref 0.7–4.0)
LYMPHS PCT: 19 %
MCH: 25.9 pg — AB (ref 26.0–34.0)
MCHC: 30.7 g/dL (ref 30.0–36.0)
MCV: 84.4 fL (ref 78.0–100.0)
MONO ABS: 0.9 10*3/uL (ref 0.1–1.0)
Monocytes Relative: 9 %
NEUTROS ABS: 6.8 10*3/uL (ref 1.7–7.7)
Neutrophils Relative %: 69 %
Platelets: 246 10*3/uL (ref 150–400)
RBC: 4.17 MIL/uL (ref 3.87–5.11)
RDW: 16.2 % — AB (ref 11.5–15.5)
WBC: 9.9 10*3/uL (ref 4.0–10.5)

## 2017-06-14 LAB — PROTIME-INR
INR: 1.24
PROTHROMBIN TIME: 15.5 s — AB (ref 11.4–15.2)

## 2017-06-14 LAB — HEMOGLOBIN A1C
Hgb A1c MFr Bld: 7 % — ABNORMAL HIGH (ref 4.8–5.6)
MEAN PLASMA GLUCOSE: 154.2 mg/dL

## 2017-06-14 NOTE — Progress Notes (Signed)
RN called and reported to Dr Royce Macadamia today's EKG interpretation showing : "AV DUAL PACED RHYTHM WITH FREQUENT VENTRICULAR PACED COMPLEXES" . Per Dr Royce Macadamia , not concerning; he asks that pacemaker orders be available for day of surgery . RN has placed signed Pacemaker order sheet on patient chart.

## 2017-06-14 NOTE — Progress Notes (Signed)
Patient reports at PAT visit that she received a letter from someone (shes unsure who) that instructed her to hold her warfarin for 5 days. Patient reports she has not taking her warfarin in 2 days.   RN called and spoke with triage nurse Abigail Butts . RN requesting wendy send copy of Warfarin instructions for surgery. Abigail Butts directed RN attention to epic 04-03-17 cardiology visit saying this was clearance for this upcoming surgery. In note it appears cardiology defers holding of warfarin to surgeon. Abigail Butts to send Dr Amado Coe holding preference list of medications which per Abigail Butts, should provide the number of days Dr Amado Coe prefers her patients to hold certain medications like warfarin

## 2017-06-17 ENCOUNTER — Ambulatory Visit (HOSPITAL_COMMUNITY): Payer: Medicare HMO | Admitting: Certified Registered Nurse Anesthetist

## 2017-06-17 ENCOUNTER — Encounter (HOSPITAL_COMMUNITY)
Admission: RE | Disposition: A | Discharge status: Home / Self Care | Payer: Self-pay | Source: Ambulatory Visit | Attending: Surgery

## 2017-06-17 ENCOUNTER — Observation Stay (HOSPITAL_COMMUNITY)
Admission: RE | Admit: 2017-06-17 | Discharge: 2017-06-18 | Disposition: A | Discharge status: Home / Self Care | Payer: Medicare HMO | Source: Ambulatory Visit | Attending: Surgery | Admitting: Surgery

## 2017-06-17 ENCOUNTER — Other Ambulatory Visit: Payer: Self-pay

## 2017-06-17 ENCOUNTER — Encounter (HOSPITAL_COMMUNITY): Payer: Self-pay | Admitting: Certified Registered Nurse Anesthetist

## 2017-06-17 DIAGNOSIS — Z79899 Other long term (current) drug therapy: Secondary | ICD-10-CM | POA: Insufficient documentation

## 2017-06-17 DIAGNOSIS — Z7901 Long term (current) use of anticoagulants: Secondary | ICD-10-CM | POA: Diagnosis not present

## 2017-06-17 DIAGNOSIS — E210 Primary hyperparathyroidism: Secondary | ICD-10-CM | POA: Diagnosis not present

## 2017-06-17 DIAGNOSIS — Z888 Allergy status to other drugs, medicaments and biological substances status: Secondary | ICD-10-CM | POA: Diagnosis not present

## 2017-06-17 DIAGNOSIS — N189 Chronic kidney disease, unspecified: Secondary | ICD-10-CM | POA: Insufficient documentation

## 2017-06-17 DIAGNOSIS — I480 Paroxysmal atrial fibrillation: Secondary | ICD-10-CM | POA: Diagnosis not present

## 2017-06-17 DIAGNOSIS — D351 Benign neoplasm of parathyroid gland: Secondary | ICD-10-CM | POA: Diagnosis not present

## 2017-06-17 DIAGNOSIS — Z86711 Personal history of pulmonary embolism: Secondary | ICD-10-CM | POA: Insufficient documentation

## 2017-06-17 DIAGNOSIS — I129 Hypertensive chronic kidney disease with stage 1 through stage 4 chronic kidney disease, or unspecified chronic kidney disease: Secondary | ICD-10-CM | POA: Insufficient documentation

## 2017-06-17 DIAGNOSIS — J449 Chronic obstructive pulmonary disease, unspecified: Secondary | ICD-10-CM | POA: Diagnosis not present

## 2017-06-17 DIAGNOSIS — K219 Gastro-esophageal reflux disease without esophagitis: Secondary | ICD-10-CM | POA: Diagnosis not present

## 2017-06-17 DIAGNOSIS — E213 Hyperparathyroidism, unspecified: Secondary | ICD-10-CM | POA: Diagnosis not present

## 2017-06-17 DIAGNOSIS — R252 Cramp and spasm: Secondary | ICD-10-CM | POA: Insufficient documentation

## 2017-06-17 DIAGNOSIS — I10 Essential (primary) hypertension: Secondary | ICD-10-CM | POA: Diagnosis not present

## 2017-06-17 DIAGNOSIS — F419 Anxiety disorder, unspecified: Secondary | ICD-10-CM | POA: Diagnosis not present

## 2017-06-17 DIAGNOSIS — Z7984 Long term (current) use of oral hypoglycemic drugs: Secondary | ICD-10-CM | POA: Diagnosis not present

## 2017-06-17 DIAGNOSIS — E119 Type 2 diabetes mellitus without complications: Secondary | ICD-10-CM | POA: Diagnosis not present

## 2017-06-17 DIAGNOSIS — Z952 Presence of prosthetic heart valve: Secondary | ICD-10-CM | POA: Diagnosis not present

## 2017-06-17 DIAGNOSIS — Z95810 Presence of automatic (implantable) cardiac defibrillator: Secondary | ICD-10-CM | POA: Insufficient documentation

## 2017-06-17 DIAGNOSIS — G4733 Obstructive sleep apnea (adult) (pediatric): Secondary | ICD-10-CM | POA: Diagnosis not present

## 2017-06-17 DIAGNOSIS — I2510 Atherosclerotic heart disease of native coronary artery without angina pectoris: Secondary | ICD-10-CM | POA: Diagnosis not present

## 2017-06-17 DIAGNOSIS — E1122 Type 2 diabetes mellitus with diabetic chronic kidney disease: Secondary | ICD-10-CM | POA: Insufficient documentation

## 2017-06-17 DIAGNOSIS — R69 Illness, unspecified: Secondary | ICD-10-CM | POA: Diagnosis not present

## 2017-06-17 DIAGNOSIS — Z882 Allergy status to sulfonamides status: Secondary | ICD-10-CM | POA: Diagnosis not present

## 2017-06-17 DIAGNOSIS — Z87891 Personal history of nicotine dependence: Secondary | ICD-10-CM | POA: Diagnosis not present

## 2017-06-17 HISTORY — PX: PARATHYROIDECTOMY: SHX19

## 2017-06-17 LAB — PROTIME-INR
INR: 1.1
PROTHROMBIN TIME: 14.1 s (ref 11.4–15.2)

## 2017-06-17 LAB — GLUCOSE, CAPILLARY
GLUCOSE-CAPILLARY: 264 mg/dL — AB (ref 65–99)
Glucose-Capillary: 162 mg/dL — ABNORMAL HIGH (ref 65–99)
Glucose-Capillary: 146 mg/dL — ABNORMAL HIGH (ref 65–99)
Glucose-Capillary: 304 mg/dL — ABNORMAL HIGH (ref 65–99)

## 2017-06-17 LAB — APTT: aPTT: 28 seconds (ref 24–36)

## 2017-06-17 SURGERY — PARATHYROIDECTOMY
Anesthesia: General | Laterality: Left

## 2017-06-17 MED ORDER — AZELASTINE HCL 0.1 % NA SOLN
2.0000 | Freq: Every day | NASAL | Status: DC
  Administered 2017-06-17: 2 via NASAL
  Filled 2017-06-17: qty 30

## 2017-06-17 MED ORDER — MIDAZOLAM HCL 5 MG/5ML IJ SOLN
INTRAMUSCULAR | Status: DC | PRN
  Administered 2017-06-17: 2 mg via INTRAVENOUS

## 2017-06-17 MED ORDER — FUROSEMIDE 40 MG PO TABS
40.0000 mg | ORAL_TABLET | Freq: Every day | ORAL | Status: DC
  Administered 2017-06-18: 40 mg via ORAL
  Filled 2017-06-17: qty 1

## 2017-06-17 MED ORDER — FENTANYL CITRATE (PF) 250 MCG/5ML IJ SOLN
INTRAMUSCULAR | Status: AC
  Filled 2017-06-17: qty 5

## 2017-06-17 MED ORDER — LACTATED RINGERS IV SOLN
INTRAVENOUS | Status: DC | PRN
  Administered 2017-06-17: 07:00:00 via INTRAVENOUS

## 2017-06-17 MED ORDER — LINAGLIPTIN 5 MG PO TABS
5.0000 mg | ORAL_TABLET | Freq: Every day | ORAL | Status: DC
  Administered 2017-06-17 – 2017-06-18 (×2): 5 mg via ORAL
  Filled 2017-06-17 (×2): qty 1

## 2017-06-17 MED ORDER — CHLORHEXIDINE GLUCONATE 4 % EX LIQD: 60.0000 mL | Freq: Once | CUTANEOUS | Status: DC

## 2017-06-17 MED ORDER — ACETAMINOPHEN 500 MG PO TABS
1000.0000 mg | ORAL_TABLET | Freq: Four times a day (QID) | ORAL | Status: DC
Start: 2017-06-17 — End: 2017-06-18
  Administered 2017-06-17 – 2017-06-18 (×3): 1000 mg via ORAL
  Filled 2017-06-17 (×3): qty 2

## 2017-06-17 MED ORDER — HYDRALAZINE HCL 20 MG/ML IJ SOLN: 10.0000 mg | INTRAMUSCULAR | Status: DC | PRN

## 2017-06-17 MED ORDER — OXYCODONE HCL 5 MG PO TABS: 5.0000 mg | ORAL_TABLET | Freq: Once | ORAL | Status: DC | PRN

## 2017-06-17 MED ORDER — DOCUSATE SODIUM 100 MG PO CAPS
100.0000 mg | ORAL_CAPSULE | Freq: Two times a day (BID) | ORAL | Status: DC
  Administered 2017-06-17 – 2017-06-18 (×2): 100 mg via ORAL
  Filled 2017-06-17 (×2): qty 1

## 2017-06-17 MED ORDER — HYDROMORPHONE HCL 1 MG/ML IJ SOLN
INTRAMUSCULAR | Status: AC
  Filled 2017-06-17: qty 1

## 2017-06-17 MED ORDER — DEXAMETHASONE SODIUM PHOSPHATE 10 MG/ML IJ SOLN
INTRAMUSCULAR | Status: DC | PRN
  Administered 2017-06-17: 10 mg via INTRAVENOUS

## 2017-06-17 MED ORDER — POLYETHYLENE GLYCOL 3350 17 G PO PACK: 17.0000 g | PACK | Freq: Every day | ORAL | Status: DC | PRN

## 2017-06-17 MED ORDER — ACETAMINOPHEN 500 MG PO TABS
1000.0000 mg | ORAL_TABLET | ORAL | Status: AC
  Administered 2017-06-17: 1000 mg via ORAL
  Filled 2017-06-17: qty 2

## 2017-06-17 MED ORDER — BUPIVACAINE HCL 0.25 % IJ SOLN
INTRAMUSCULAR | Status: DC | PRN
  Administered 2017-06-17: 2 mL

## 2017-06-17 MED ORDER — INSULIN ASPART 100 UNIT/ML ~~LOC~~ SOLN
0.0000 [IU] | Freq: Three times a day (TID) | SUBCUTANEOUS | Status: DC
  Administered 2017-06-17: 11 [IU] via SUBCUTANEOUS
  Administered 2017-06-18: 3 [IU] via SUBCUTANEOUS

## 2017-06-17 MED ORDER — DIPHENHYDRAMINE HCL 12.5 MG/5ML PO ELIX: 12.5000 mg | ORAL_SOLUTION | Freq: Every day | ORAL | Status: DC | PRN

## 2017-06-17 MED ORDER — ALPRAZOLAM 0.5 MG PO TABS: 0.5000 mg | ORAL_TABLET | Freq: Two times a day (BID) | ORAL | Status: DC | PRN

## 2017-06-17 MED ORDER — SUGAMMADEX SODIUM 200 MG/2ML IV SOLN
INTRAVENOUS | Status: AC
  Filled 2017-06-17: qty 2

## 2017-06-17 MED ORDER — GABAPENTIN 300 MG PO CAPS
300.0000 mg | ORAL_CAPSULE | ORAL | Status: AC
  Administered 2017-06-17: 300 mg via ORAL
  Filled 2017-06-17: qty 1

## 2017-06-17 MED ORDER — ROCURONIUM BROMIDE 10 MG/ML (PF) SYRINGE
PREFILLED_SYRINGE | INTRAVENOUS | Status: DC | PRN
  Administered 2017-06-17: 50 mg via INTRAVENOUS

## 2017-06-17 MED ORDER — WARFARIN SODIUM 5 MG PO TABS
5.0000 mg | ORAL_TABLET | ORAL | Status: AC
Start: 2017-06-17 — End: 2017-06-17
  Administered 2017-06-17: 5 mg via ORAL
  Filled 2017-06-17: qty 1

## 2017-06-17 MED ORDER — ZOLPIDEM TARTRATE 5 MG PO TABS
5.0000 mg | ORAL_TABLET | Freq: Every evening | ORAL | Status: DC | PRN
  Administered 2017-06-17: 5 mg via ORAL
  Filled 2017-06-17: qty 1

## 2017-06-17 MED ORDER — ONDANSETRON HCL 4 MG/2ML IJ SOLN
INTRAMUSCULAR | Status: DC | PRN
  Administered 2017-06-17: 4 mg via INTRAVENOUS

## 2017-06-17 MED ORDER — LIDOCAINE 2% (20 MG/ML) 5 ML SYRINGE
INTRAMUSCULAR | Status: AC
  Filled 2017-06-17: qty 5

## 2017-06-17 MED ORDER — CEFAZOLIN SODIUM-DEXTROSE 2-4 GM/100ML-% IV SOLN
2.0000 g | INTRAVENOUS | Status: AC
  Administered 2017-06-17: 1 g via INTRAVENOUS
  Filled 2017-06-17: qty 100

## 2017-06-17 MED ORDER — ONDANSETRON HCL 4 MG/2ML IJ SOLN
INTRAMUSCULAR | Status: AC
  Filled 2017-06-17: qty 2

## 2017-06-17 MED ORDER — MIDAZOLAM HCL 2 MG/2ML IJ SOLN
INTRAMUSCULAR | Status: AC
  Filled 2017-06-17: qty 2

## 2017-06-17 MED ORDER — WARFARIN SODIUM 2.5 MG PO TABS: 2.5000 mg | ORAL_TABLET | ORAL | Status: DC

## 2017-06-17 MED ORDER — ROCURONIUM BROMIDE 10 MG/ML (PF) SYRINGE
PREFILLED_SYRINGE | INTRAVENOUS | Status: AC
  Filled 2017-06-17: qty 5

## 2017-06-17 MED ORDER — WARFARIN - PHYSICIAN DOSING INPATIENT: Freq: Every day | Status: DC

## 2017-06-17 MED ORDER — CARVEDILOL 6.25 MG PO TABS
6.2500 mg | ORAL_TABLET | Freq: Two times a day (BID) | ORAL | Status: DC
  Administered 2017-06-17 – 2017-06-18 (×2): 6.25 mg via ORAL
  Filled 2017-06-17 (×2): qty 1

## 2017-06-17 MED ORDER — SUVOREXANT 10 MG PO TABS: 10.0000 mg | ORAL_TABLET | Freq: Every evening | ORAL | Status: DC | PRN

## 2017-06-17 MED ORDER — HYDROMORPHONE HCL 1 MG/ML IJ SOLN
0.2500 mg | INTRAMUSCULAR | Status: DC | PRN
  Administered 2017-06-17 (×2): 0.5 mg via INTRAVENOUS

## 2017-06-17 MED ORDER — ONDANSETRON 4 MG PO TBDP: 4.0000 mg | ORAL_TABLET | Freq: Four times a day (QID) | ORAL | Status: DC | PRN

## 2017-06-17 MED ORDER — ALBUTEROL SULFATE (2.5 MG/3ML) 0.083% IN NEBU: 2.5000 mg | INHALATION_SOLUTION | Freq: Four times a day (QID) | RESPIRATORY_TRACT | Status: DC | PRN

## 2017-06-17 MED ORDER — OXYCODONE HCL 5 MG/5ML PO SOLN: 5.0000 mg | Freq: Once | ORAL | Status: DC | PRN

## 2017-06-17 MED ORDER — ONDANSETRON HCL 4 MG/2ML IJ SOLN: 4.0000 mg | Freq: Once | INTRAMUSCULAR | Status: DC | PRN

## 2017-06-17 MED ORDER — PROPOFOL 10 MG/ML IV BOLUS
INTRAVENOUS | Status: AC
  Filled 2017-06-17: qty 20

## 2017-06-17 MED ORDER — PROPOFOL 10 MG/ML IV BOLUS
INTRAVENOUS | Status: DC | PRN
  Administered 2017-06-17: 150 mg via INTRAVENOUS

## 2017-06-17 MED ORDER — PANTOPRAZOLE SODIUM 40 MG PO TBEC
40.0000 mg | DELAYED_RELEASE_TABLET | Freq: Every day | ORAL | Status: DC
  Administered 2017-06-17 – 2017-06-18 (×2): 40 mg via ORAL
  Filled 2017-06-17 (×2): qty 1

## 2017-06-17 MED ORDER — INSULIN ASPART 100 UNIT/ML ~~LOC~~ SOLN
0.0000 [IU] | Freq: Every day | SUBCUTANEOUS | Status: DC
  Administered 2017-06-17: 3 [IU] via SUBCUTANEOUS

## 2017-06-17 MED ORDER — BUPIVACAINE HCL (PF) 0.25 % IJ SOLN
INTRAMUSCULAR | Status: AC
  Filled 2017-06-17: qty 30

## 2017-06-17 MED ORDER — PHENYLEPHRINE 40 MCG/ML (10ML) SYRINGE FOR IV PUSH (FOR BLOOD PRESSURE SUPPORT)
PREFILLED_SYRINGE | INTRAVENOUS | Status: DC | PRN
  Administered 2017-06-17 (×4): 80 ug via INTRAVENOUS
  Administered 2017-06-17 (×2): 40 ug via INTRAVENOUS

## 2017-06-17 MED ORDER — DOFETILIDE 250 MCG PO CAPS
250.0000 ug | ORAL_CAPSULE | Freq: Two times a day (BID) | ORAL | Status: DC
  Administered 2017-06-17 – 2017-06-18 (×2): 250 ug via ORAL
  Filled 2017-06-17 (×3): qty 1

## 2017-06-17 MED ORDER — TIZANIDINE HCL 4 MG PO TABS: 2.0000 mg | ORAL_TABLET | Freq: Every evening | ORAL | Status: DC | PRN

## 2017-06-17 MED ORDER — GLIMEPIRIDE 1 MG PO TABS
1.0000 mg | ORAL_TABLET | Freq: Every day | ORAL | Status: DC
  Administered 2017-06-18: 1 mg via ORAL
  Filled 2017-06-17: qty 1

## 2017-06-17 MED ORDER — OXYCODONE HCL 5 MG/5ML PO SOLN
5.0000 mg | Freq: Once | ORAL | Status: DC | PRN
  Filled 2017-06-17: qty 5

## 2017-06-17 MED ORDER — FENTANYL CITRATE (PF) 100 MCG/2ML IJ SOLN: 25.0000 ug | INTRAMUSCULAR | Status: DC | PRN

## 2017-06-17 MED ORDER — PAROXETINE HCL 10 MG PO TABS
30.0000 mg | ORAL_TABLET | Freq: Every day | ORAL | Status: DC
  Administered 2017-06-17 – 2017-06-18 (×2): 30 mg via ORAL
  Filled 2017-06-17 (×2): qty 1

## 2017-06-17 MED ORDER — LIDOCAINE 2% (20 MG/ML) 5 ML SYRINGE
INTRAMUSCULAR | Status: DC | PRN
  Administered 2017-06-17: 60 mg via INTRAVENOUS

## 2017-06-17 MED ORDER — TRAMADOL HCL 50 MG PO TABS: 50.0000 mg | ORAL_TABLET | Freq: Four times a day (QID) | ORAL | Status: DC | PRN

## 2017-06-17 MED ORDER — HEPARIN SODIUM (PORCINE) 5000 UNIT/ML IJ SOLN
5000.0000 [IU] | Freq: Three times a day (TID) | INTRAMUSCULAR | Status: DC
  Administered 2017-06-17 – 2017-06-18 (×2): 5000 [IU] via SUBCUTANEOUS
  Filled 2017-06-17 (×2): qty 1

## 2017-06-17 MED ORDER — DEXAMETHASONE SODIUM PHOSPHATE 10 MG/ML IJ SOLN
INTRAMUSCULAR | Status: AC
  Filled 2017-06-17: qty 1

## 2017-06-17 MED ORDER — ONDANSETRON HCL 4 MG/2ML IJ SOLN: 4.0000 mg | Freq: Four times a day (QID) | INTRAMUSCULAR | Status: DC | PRN

## 2017-06-17 MED ORDER — BISACODYL 10 MG RE SUPP: 10.0000 mg | Freq: Every day | RECTAL | Status: DC | PRN

## 2017-06-17 MED ORDER — FENTANYL CITRATE (PF) 250 MCG/5ML IJ SOLN
INTRAMUSCULAR | Status: DC | PRN
  Administered 2017-06-17 (×3): 50 ug via INTRAVENOUS

## 2017-06-17 MED ORDER — AMLODIPINE BESYLATE 5 MG PO TABS
5.0000 mg | ORAL_TABLET | Freq: Every day | ORAL | Status: DC
  Administered 2017-06-17 – 2017-06-18 (×2): 5 mg via ORAL
  Filled 2017-06-17 (×2): qty 1

## 2017-06-17 MED ORDER — SUCCINYLCHOLINE CHLORIDE 200 MG/10ML IV SOSY
PREFILLED_SYRINGE | INTRAVENOUS | Status: AC
  Filled 2017-06-17: qty 10

## 2017-06-17 SURGICAL SUPPLY — 42 items
ADH SKN CLS APL DERMABOND .7 (GAUZE/BANDAGES/DRESSINGS)
APL SKNCLS STERI-STRIP NONHPOA (GAUZE/BANDAGES/DRESSINGS) ×1
ATTRACTOMAT 16X20 MAGNETIC DRP (DRAPES) ×2 IMPLANT
BENZOIN TINCTURE PRP APPL 2/3 (GAUZE/BANDAGES/DRESSINGS) ×1 IMPLANT
BLADE HEX COATED 2.75 (ELECTRODE) ×2 IMPLANT
BLADE SURG 15 STRL LF DISP TIS (BLADE) ×1 IMPLANT
BLADE SURG 15 STRL SS (BLADE) ×2
CHLORAPREP W/TINT 26ML (MISCELLANEOUS) ×4 IMPLANT
CLIP VESOCCLUDE MED 6/CT (CLIP) ×4 IMPLANT
CLIP VESOCCLUDE SM WIDE 6/CT (CLIP) ×4 IMPLANT
DERMABOND ADVANCED (GAUZE/BANDAGES/DRESSINGS)
DERMABOND ADVANCED .7 DNX12 (GAUZE/BANDAGES/DRESSINGS) IMPLANT
DISSECTOR ROUND CHERRY 3/8 STR (MISCELLANEOUS) ×1 IMPLANT
DRAPE LAPAROTOMY T 98X78 PEDS (DRAPES) ×2 IMPLANT
ELECT PENCIL ROCKER SW 15FT (MISCELLANEOUS) ×2 IMPLANT
ELECT REM PT RETURN 15FT ADLT (MISCELLANEOUS) ×2 IMPLANT
GAUZE 4X4 16PLY RFD (DISPOSABLE) ×2 IMPLANT
GAUZE SPONGE 4X4 12PLY STRL (GAUZE/BANDAGES/DRESSINGS) ×1 IMPLANT
GLOVE SURG ORTHO 8.0 STRL STRW (GLOVE) ×2 IMPLANT
GOWN STRL REUS W/TWL XL LVL3 (GOWN DISPOSABLE) ×6 IMPLANT
HEMOSTAT SURGICEL 2X4 FIBR (HEMOSTASIS) IMPLANT
ILLUMINATOR WAVEGUIDE N/F (MISCELLANEOUS) ×2 IMPLANT
KIT BASIN OR (CUSTOM PROCEDURE TRAY) ×2 IMPLANT
LIGHT WAVEGUIDE WIDE FLAT (MISCELLANEOUS) IMPLANT
NEEDLE HYPO 25X1 1.5 SAFETY (NEEDLE) ×2 IMPLANT
PACK BASIC VI WITH GOWN DISP (CUSTOM PROCEDURE TRAY) ×2 IMPLANT
POWDER SURGICEL 3.0 GRAM (HEMOSTASIS) IMPLANT
SHEARS HARMONIC 9CM CVD (BLADE) ×2 IMPLANT
STAPLER VISISTAT 35W (STAPLE) ×2 IMPLANT
STRIP CLOSURE SKIN 1/2X4 (GAUZE/BANDAGES/DRESSINGS) ×2 IMPLANT
SUT MNCRL AB 4-0 PS2 18 (SUTURE) ×2 IMPLANT
SUT SILK 2 0 (SUTURE) ×2
SUT SILK 2-0 18XBRD TIE 12 (SUTURE) IMPLANT
SUT SILK 3 0 (SUTURE)
SUT SILK 3-0 18XBRD TIE 12 (SUTURE) IMPLANT
SUT VIC AB 3-0 SH 18 (SUTURE) ×3 IMPLANT
SYR BULB IRRIGATION 50ML (SYRINGE) ×2 IMPLANT
SYR CONTROL 10ML LL (SYRINGE) ×2 IMPLANT
TAPE CLOTH SURG 4X10 WHT LF (GAUZE/BANDAGES/DRESSINGS) ×1 IMPLANT
TOWEL OR 17X26 10 PK STRL BLUE (TOWEL DISPOSABLE) ×2 IMPLANT
TOWEL OR NON WOVEN STRL DISP B (DISPOSABLE) ×2 IMPLANT
YANKAUER SUCT BULB TIP 10FT TU (MISCELLANEOUS) ×2 IMPLANT

## 2017-06-17 NOTE — Anesthesia Preprocedure Evaluation (Addendum)
Anesthesia Evaluation  Patient identified by MRN, date of birth, ID band Patient awake    Reviewed: Allergy & Precautions, NPO status , Patient's Chart, lab work & pertinent test results  Airway Mallampati: II  TM Distance: >3 FB     Dental  (+) Edentulous Upper, Edentulous Lower   Pulmonary former smoker,    breath sounds clear to auscultation       Cardiovascular hypertension,  Rhythm:Regular Rate:Normal     Neuro/Psych    GI/Hepatic   Endo/Other  diabetes  Renal/GU      Musculoskeletal   Abdominal   Peds  Hematology   Anesthesia Other Findings   Reproductive/Obstetrics                            Anesthesia Physical Anesthesia Plan  ASA: III  Anesthesia Plan: General   Post-op Pain Management:    Induction: Intravenous  PONV Risk Score and Plan: Ondansetron and Metaclopromide  Airway Management Planned: Oral ETT  Additional Equipment:   Intra-op Plan:   Post-operative Plan: Extubation in OR  Informed Consent: I have reviewed the patients History and Physical, chart, labs and discussed the procedure including the risks, benefits and alternatives for the proposed anesthesia with the patient or authorized representative who has indicated his/her understanding and acceptance.     Plan Discussed with: CRNA and Anesthesiologist  Anesthesia Plan Comments: (H/O SVT, Paroxysmal AFib denies palpitations on tikosyn, correg, Coumadin held since 4/24, INR- 1.1 AICD in place [employer], never has discharged, paced AV rhythm will place magnet over device Type 2 DM glucose 148 Htn Asthma lungs clear  Plan GA with oral ETT )       Anesthesia Quick Evaluation

## 2017-06-17 NOTE — Progress Notes (Signed)
Called pharmacy tech to come down to add "cholesterol medication" to list.  Pt stated to tech that she takes atorvastatin, but has not taken lately due to pain in her legs.  Added this to allergy list.

## 2017-06-17 NOTE — H&P (Signed)
Emily Abbott  [id number]  July 22, 1940  CC: hyperparathyroid  HPI: this is a pleasant 77 year old woman who I initially met in August 2018 for evaluation of primary hyperparathyroidism. At the time this seemed to be fairly asymptomatic. She has arthritis in multiple areas of her body but does not think she has had any new bone pain, denies abdominal pain, although she does have issues with constipation. Denies any depression or neuropsychiatric disturbances that she is aware of. She has never had symptomatic kidney stones but she does have a known 7 mm nonobstructing stone in the right collecting system on recent ultrasound. She does not know when her last bone mineral density test was but states that they have always been good.  Her calcium looks to have been stable over the last several years of checks, running about 10.3, with upper limit of the reference range 10.2. Her parathyroid hormone levels checked in May and was found to be 79 with upper limit reference range of 65. Her chloride was 99 and her phosphorus was 2.9 with a ratio of 34.1, however this is in the setting of chronic kidney disease with a creatinine of 1.36 and eGFR of 36. With these findings she was referred to [facility] (Dr. Tye Savoy), where she states that she had more workup done. Her most recent studies showed an elevated calcium level of 11.0. PTH level is elevated at 109. 24-hour urine collection for calcium is elevated at 335. Vitamin D levels are normal. Patient underwent diagnostic studies including a nuclear medicine parathyroid scan which was unrevealing. Patient underwent thyroid ultrasound showing a small benign thyroid nodule but no evidence of parathyroid adenoma. Patient finally underwent a CT scan of the neck with parathyroid protocol which identified an 8.5 x 5.0 x 5.5 mm nodular mass separate from the thyroid on the upper left side consistent with parathyroid adenoma.  She denies any  family history of endocrine disorders that she is aware of.  She has multiple medical problems including coronary artery disease, paroxysmal atrial fibrillation, status post mitral valve replacement and biventricular ICD placement, COPD, diabetes, GERD, hypertension, pulmonary embolism, and anxiety. Review of her medication list in Epic does not reveal any calcium-altering meds, though she is on COUMADIN, vitamin D (has not taken in a while), multiple inhalers, hypoglycemic agents, lasix, TIKOSYN, and ASA325 among others. She is a former smoker but quit 15 years ago. She has a hoarse voice which she's had for about a year. This was evaluated with a CT of the neck in 2016 which did not show any neck masses or vocal cord abnormality. She does have known pulmonary nodules which have been stable over serial CTs.  Her Cardiologist is Dr. Lovena Le. She had her pacer/ICD changed on the 18th. She has been cleared for surgery from his standpoint. She has held her coumadin appropriately.   Allergies No Known Drug Allergies 09/19/2016 Allergies Reconciled   Medication Dofetilide (250MCG Capsule, Oral) Active. Ezetimibe (10MG Tablet, Oral) Active. Fluticasone Propionate (50MCG/ACT Suspension, Nasal) Active. OneTouch Ultra Blue (In Vitro) Active. ALPRAZolam (0.5MG Tablet, Oral) Active. AmLODIPine Besylate (5MG Tablet, Oral) Active. Breo Ellipta (100-25MCG/INH Aero Pow Br Act, Inhalation) Active. Carvedilol (6.25MG Tablet, Oral) Active. Furosemide (20MG Tablet, Oral) Active. Tradjenta (5MG Tablet, Oral) Active. Warfarin Sodium (5MG Tablet, Oral) Active. PARoxetine HCl (30MG Tablet, Oral) Active. Atorvastatin Calcium (20MG Tablet, Oral) Active. Omeprazole (40MG Capsule DR, Oral) Active. Medications Reconciled  Social History  No drug use  Tobacco use  Former smoker.  Family  History  Arthritis  Sister, Son. Migraine Headache  Daughter.  Pregnancy / Birth History Gravida   4 Length (months) of breastfeeding  12-24 Para  4  Other Problems  Atrial Fibrillation  Back Pain  Bladder Problems     Review of Systems  General Present- Fatigue. Not Present- Appetite Loss, Chills, Fever, Night Sweats, Weight Gain and Weight Loss. Skin Present- Dryness. Not Present- Change in Wart/Mole, Hives, Jaundice, New Lesions, Non-Healing Wounds, Rash and Ulcer. Respiratory Present- Difficulty Breathing and Snoring. Not Present- Bloody sputum, Chronic Cough and Wheezing. Cardiovascular Present- Swelling of Extremities. Not Present- Chest Pain, Difficulty Breathing Lying Down, Leg Cramps, Palpitations, Rapid Heart Rate and Shortness of Breath. Gastrointestinal Present- Constipation. Not Present- Abdominal Pain, Bloating, Bloody Stool, Change in Bowel Habits, Chronic diarrhea, Difficulty Swallowing, Excessive gas, Gets full quickly at meals, Hemorrhoids, Indigestion, Nausea, Rectal Pain and Vomiting. Musculoskeletal Present- Muscle Pain. Not Present- Back Pain, Joint Pain, Joint Stiffness, Muscle Weakness and Swelling of Extremities. Hematology Present- Blood Thinners and Gland problems. Not Present- Easy Bruising, Excessive bleeding, HIV and Persistent Infections. All other systems negative  Vitals:   06/17/17 0606  BP: (!) 159/84  Pulse: 62  Resp: 18  Temp: 98 F (36.7 C)  SpO2: 98%     Physical Exam  General Note: She is alert and oriented, appears stated age   Integumentary Note: Skin is warm and dry   Head and Neck Note: There is no mass, no thyromegaly, no lymphadenopathy in the cervical or supraclavicular areas   Eye Note: Pupils equally round and reactive, no scleral icterus   ENMT Note: Moist mucous membranes, dentures in place   Chest and Lung Exam Note: Unlabored respirations, no tactile fremitus.   Cardiovascular Note: Regular rate and rhythm, mild nonpitting bilateral lower extremity edema   Abdomen Note: Obese, nontender  nondistended no mass or organomegaly   Neurologic Note: Grossly intact, normal gait   Neuropsychiatric Note: Normal mood and affect, appropriate insight   Musculoskeletal Note: Strength symmetrical throughout, no deformity     Assessment & Plan  HYPERPARATHYROIDISM (E21.3) Story: I discussed with her the different areas of hyperparathyroidism. We discussed the surgical option including risks of bleeding, infection, pain, scarring injury to structures in the neck specifically to the recurrent laryngeal nerve and sequelae of that, risk of recurrent hypercalcemia. We also discussed that she is a candidate for operative intervention despite her asymptomatic status given her new finding of renal stone, her renal insufficiency with a EGFR of 36, although her biochemical abnormalities are mild as it comes the PTH and calcium. We discussed the option of nonoperative management with observation with the risk of worsening bone mineral density, kidney stones etc. After much consideration and second opinion from Dr. Harlow Asa, the patient is ready to proceed with surgery.

## 2017-06-17 NOTE — Op Note (Signed)
Operative Note  Emily Abbott  [id number]  [id number]  06/17/2017   Surgeon: Victorino Sparrow ConnorMD  Assistant: Armandina Gemma MD  Procedure performed: minimally invasive left superior parathyroidectomy  Preop diagnosis: primary hyperparathyroidism Post-op diagnosis/intraop findings: left superior parathyroid adenoma  Specimens: left superior parathyroid Retained items: none EBL: minimal cc Complications: none  Description of procedure: After obtaining informed consent the patient was taken to the operating room and placed supine on operating room table wheregeneral endotracheal anesthesia was initiated, preoperative antibiotics were administered, SCDs applied, and a formal timeout was performed. Neck and chest were prepped and draped in usual sterile fashion. After infiltration with local, a transverse incision was made 2 fingerbreadths above the sternal notch. Cautery was used to divide the subcutaneous tissue and platysma and then subplatysmal flaps were developed circumferentially and a Weitlander retractor placed for exposure. The strap muscles were divided in the midline and then reflected laterallyon the left side using blunt dissection and cautery where indicated to expose the thyroid lobe. The middle thyroid vein was identified clipped and divided. With gentle blunt dissection, the superior aspect of the thyroid lobe was mobilized.  Dissection was carried through adipose tissue and the enlarged parathyroid gland was readily visible on the posterior aspect of the upper pole. This was gently mobilized until it was attached only by its vascular pedicle which was clipped and divided. Care was taken to avoid the recurrent laryngeal nerve and the esophagus. The parathyroid gland was completely excised. It was submitted to pathology where frozen section confirmed parathyroid tissue consistent with adenoma. The neck was irrigated with warm saline and good hemostasis was noted. Fibrillar was  placed in the operative field. The strap muscles were reapproximated with interrupted 3-0 Vicryls. The platysma was reapproximated with interrupted 3-0 Vicryl's. The skin was closed with running subcuticular 4-0 Monocryl. Benzoin Steri-Strips and a dry bandage were then applied. The patient was then awakened, extubated and taken to PACU in stable condition.   All counts were correct at the completion of the case.

## 2017-06-17 NOTE — Transfer of Care (Signed)
Immediate Anesthesia Transfer of Care Note  Patient: TIASHA HELVIE  Procedure(s) Performed: LEFT UPPER PARATHYROIDECTOMY (Left )  Patient Location: PACU  Anesthesia Type:General  Level of Consciousness: awake, alert , oriented and patient cooperative  Airway & Oxygen Therapy: Patient Spontanous Breathing and Patient connected to face mask oxygen  Post-op Assessment: Report given to RN, Post -op Vital signs reviewed and stable and Patient moving all extremities  Post vital signs: Reviewed and stable  Last Vitals:  Vitals Value Taken Time  BP 156/66 06/17/2017  9:10 AM  Temp    Pulse 64 06/17/2017  9:11 AM  Resp 18 06/17/2017  9:11 AM  SpO2 94 % 06/17/2017  9:11 AM  Vitals shown include unvalidated device data.  Last Pain:  Vitals:   06/17/17 0626  TempSrc:   PainSc: 0-No pain      Patients Stated Pain Goal: 4 (73/73/66 8159)  Complications: No apparent anesthesia complications

## 2017-06-17 NOTE — Anesthesia Postprocedure Evaluation (Signed)
Anesthesia Post Note  Patient: Emily Abbott  Procedure(s) Performed: LEFT UPPER PARATHYROIDECTOMY (Left )     Patient location during evaluation: PACU Anesthesia Type: General Level of consciousness: awake and alert Pain management: pain level controlled Vital Signs Assessment: post-procedure vital signs reviewed and stable Respiratory status: spontaneous breathing, nonlabored ventilation, respiratory function stable and patient connected to nasal cannula oxygen Cardiovascular status: blood pressure returned to baseline and stable Postop Assessment: no apparent nausea or vomiting Anesthetic complications: no    Last Vitals:  Vitals:   06/17/17 1015 06/17/17 1030  BP: 139/78 131/70  Pulse: 68 67  Resp: 17 15  Temp:    SpO2: 94% 93%    Last Pain:  Vitals:   06/17/17 1000  TempSrc:   PainSc: Asleep                 Jamail Cullers A.

## 2017-06-17 NOTE — Addendum Note (Signed)
Addendum  created 06/17/17 1904 by Roberts Gaudy, MD   Attestation recorded in [place], [place] filed

## 2017-06-17 NOTE — Anesthesia Procedure Notes (Signed)
Procedure Name: Intubation Date/Time: 06/17/2017 7:45 AM Performed by: Mitzie Na, CRNA Pre-anesthesia Checklist: Patient identified, Emergency Drugs available, Suction available, Patient being monitored and Timeout performed Patient Re-evaluated:Patient Re-evaluated prior to induction Oxygen Delivery Method: Circle system utilized Preoxygenation: Pre-oxygenation with 100% oxygen Induction Type: IV induction Ventilation: Oral airway inserted - appropriate to patient size and Mask ventilation without difficulty Laryngoscope Size: Mac and 3 Grade View: Grade I Tube type: Oral Tube size: 7.0 mm Number of attempts: 1 Airway Equipment and Method: Stylet Placement Confirmation: ETT inserted through vocal cords under direct vision,  positive ETCO2 and breath sounds checked- equal and bilateral Secured at: 22 cm Tube secured with: Tape Dental Injury: Teeth and Oropharynx as per pre-operative assessment

## 2017-06-17 NOTE — Progress Notes (Addendum)
Per Dr. Linna Caprice, pt took her own coreg and tikosyn at 0710 this morning since she had not taken it prior to arrival. Pt is diabetic, sugar was taken prior to surgery and it read 162 on CBG monitor per [place], [place].  Machine data has not crossed over into Epic.

## 2017-06-18 ENCOUNTER — Ambulatory Visit: Payer: Medicare HMO

## 2017-06-18 DIAGNOSIS — E210 Primary hyperparathyroidism: Secondary | ICD-10-CM | POA: Diagnosis not present

## 2017-06-18 LAB — GLUCOSE, CAPILLARY: Glucose-Capillary: 160 mg/dL — ABNORMAL HIGH (ref 65–99)

## 2017-06-18 LAB — CBC
HCT: 33.9 % — ABNORMAL LOW (ref 36.0–46.0)
HEMOGLOBIN: 10.6 g/dL — AB (ref 12.0–15.0)
MCH: 26.1 pg (ref 26.0–34.0)
MCHC: 31.3 g/dL (ref 30.0–36.0)
MCV: 83.5 fL (ref 78.0–100.0)
Platelets: 242 10*3/uL (ref 150–400)
RBC: 4.06 MIL/uL (ref 3.87–5.11)
RDW: 15.8 % — ABNORMAL HIGH (ref 11.5–15.5)
WBC: 14.7 10*3/uL — AB (ref 4.0–10.5)

## 2017-06-18 LAB — BASIC METABOLIC PANEL
Anion gap: 10 (ref 5–15)
BUN: 18 mg/dL (ref 6–20)
CHLORIDE: 106 mmol/L (ref 101–111)
CO2: 24 mmol/L (ref 22–32)
Calcium: 9.8 mg/dL (ref 8.9–10.3)
Creatinine, Ser: 1.11 mg/dL — ABNORMAL HIGH (ref 0.44–1.00)
GFR calc Af Amer: 54 mL/min — ABNORMAL LOW (ref >60)
GFR calc non Af Amer: 46 mL/min — ABNORMAL LOW (ref >60)
Glucose, Bld: 162 mg/dL — ABNORMAL HIGH (ref 65–99)
POTASSIUM: 4.2 mmol/L (ref 3.5–5.1)
SODIUM: 140 mmol/L (ref 135–145)

## 2017-06-18 LAB — MAGNESIUM: Magnesium: 2.1 mg/dL (ref 1.7–2.4)

## 2017-06-18 LAB — PHOSPHORUS: PHOSPHORUS: 4.3 mg/dL (ref 2.5–4.6)

## 2017-06-18 LAB — PROTIME-INR
INR: 1.13
PROTHROMBIN TIME: 14.4 s (ref 11.4–15.2)

## 2017-06-18 MED ORDER — ACETAMINOPHEN 325 MG PO TABS: 650.0000 mg | ORAL_TABLET | Freq: Four times a day (QID) | ORAL | Status: DC | PRN

## 2017-06-18 NOTE — Discharge Instructions (Signed)
[facility]  PARATHYROID SURGERY:  POST-OP INSTRUCTIONS  Always review your discharge instruction sheet from the facility where your surgery was performed.  A prescription for pain medication may be given to you upon discharge.  Take your pain medication as prescribed.  If narcotic pain medicine is not needed, then you may take acetaminophen (Tylenol) or ibuprofen (Advil) as needed.  Take your usually prescribed medications unless otherwise directed.  If you need a refill on your pain medication, please contact our office during regular business hours.  Prescriptions cannot be processed by our office after 5 pm or on weekends.  Start with a light diet upon arrival home, such as soup and crackers or toast.  Be sure to drink plenty of fluids daily.  Resume your normal diet the day after surgery.  Most patients will experience some swelling and bruising on the chest and neck area.  Ice packs will help.  Swelling and bruising can take several days to resolve.   It is common to experience some constipation after surgery.  Increasing fluid intake and taking a stool softener (Colace) will usually help or prevent this problem.  A mild laxative (Milk of Magnesia or Miralax) should be taken according to package directions if there has been no bowel movement after 48 hours.  You have steri-strips and a gauze dressing over your incision.  You may remove the gauze bandage on the second day after surgery, and you may shower at that time.  Leave your steri-strips (small skin tapes) in place directly over the incision.  These strips should remain on the skin for 5-7 days and then be removed.  You may get them wet in the shower and pat them dry.  You may resume regular (light) daily activities beginning the next day (such as daily self-care, walking, climbing stairs) gradually increasing activities as tolerated.  You may have sexual intercourse when it is comfortable.  Refrain from any heavy  lifting or straining until approved by your doctor.  You may drive when you no longer are taking prescription pain medication, you can comfortably wear a seatbelt, and you can safely maneuver your car and apply brakes.  You should see your surgeon in the office for a follow-up appointment approximately three weeks after your surgery.  Make sure that you call for this appointment within a day or two after you arrive home to insure a convenient appointment time.  WHEN TO CALL YOUR DOCTOR: -- Fever greater than 101.5 -- Inability to urinate -- Nausea and/or vomiting - persistent -- Extreme swelling or bruising -- Continued bleeding from incision -- Increased pain, redness, or drainage from the incision -- Difficulty swallowing or breathing -- Muscle cramping or spasms -- Numbness or tingling in hands or around lips  The clinic staff is available to answer your questions during regular business hours.  Please dont hesitate to call and ask to speak to one of the nurses if you have concerns.  [facility] Office: [phone]

## 2017-06-18 NOTE — Discharge Summary (Signed)
Physician Discharge Summary  Patient ID: Emily Abbott MRN: [id number] DOB/AGE: 1940-07-23 77 y.o.  Admit date: 06/17/2017 Discharge date: 06/18/2017  Admission Diagnoses:  Discharge Diagnoses:  Active Problems:   Hyperparathyroidism [facility])   Discharged Condition: good  Hospital Course: She as observed overnight following minimally invasive left superior parathyroidectomy. She had no issues overnight. Her pain was controlled with tylenol. No issues with hoarseness or dysphagia, no symptoms of hypocalcemia. She was stable for discharge on the morning of post op day 1.   Consults: None  Significant Diagnostic Studies: labs: see epic  Treatments: surgery: as above  Discharge Exam: Blood pressure (!) 161/81, pulse 67, temperature 98.3 F (36.8 C), resp. rate 17, height 5' 6.5" (1.689 m), weight 93 kg (205 lb), SpO2 97 %. General appearance: alert and cooperative Neck: neck is soft, no swelling at surgical site which is c/d/i with steri strips Resp: unlabored GI: soft, non-tender; bowel sounds normal; no masses,  no organomegaly Skin: Skin color, texture, turgor normal. No rashes or lesions Neurologic: Grossly normal  Disposition: Discharge disposition: 01-Home or Self Care        Allergies as of 06/18/2017      Reactions   Adhesive [tape] Other (See Comments)   Burning sensation   Atorvastatin    Makes legs hurt    Metformin And Related    "caused me kidney problems"    Sulfonamide Derivatives Other (See Comments)   Unknown      Medication List    TAKE these medications   acetaminophen 325 MG tablet Commonly known as:  TYLENOL Take 2 tablets (650 mg total) by mouth every 6 (six) hours as needed for mild pain or moderate pain.   albuterol 108 (90 Base) MCG/ACT inhaler Commonly known as:  PROAIR HFA Inhale 1-2 puffs into the lungs every 6 (six) hours as needed for wheezing or shortness of breath.   ALPRAZolam 0.5 MG tablet Commonly known as:  XANAX Take 0.5  mg by mouth 2 (two) times daily as needed for anxiety.   amLODipine 5 MG tablet Commonly known as:  NORVASC Take 5 mg by mouth daily.   atorvastatin 20 MG tablet Commonly known as:  LIPITOR Take 20 mg by mouth daily.   Azelastine HCl 137 MCG/SPRAY Soln Place 2 sprays into both nostrils at bedtime.   BELSOMRA 10 MG Tabs Generic drug:  Suvorexant Take 10 mg by mouth at bedtime as needed (for sleep).   BREO ELLIPTA 100-25 MCG/INH Aepb Generic drug:  fluticasone furoate-vilanterol INHALE 1 PUFF DAILY   carvedilol 6.25 MG tablet Commonly known as:  COREG Take 6.25 mg by mouth 2 (two) times daily with a meal.   diphenhydrAMINE 25 MG tablet Commonly known as:  BENADRYL Take 12.5 mg by mouth daily as needed for itching.   dofetilide 250 MCG capsule Commonly known as:  TIKOSYN TAKE ONE CAPSULE BY MOUTH TWICE A DAY What changed:    how much to take  how to take this  when to take this   ezetimibe 10 MG tablet Commonly known as:  ZETIA Take 10 mg by mouth daily.   furosemide 20 MG tablet Commonly known as:  LASIX TAKE 2 TABLETS BY MOUTH EVERY DAY What changed:    how much to take  how to take this  when to take this   glimepiride 1 MG tablet Commonly known as:  AMARYL Take 1 mg by mouth daily.   HYDROcodone-acetaminophen 5-325 MG tablet Commonly known as:  NORCO/VICODIN Take  1 tablet by mouth every 6 (six) hours as needed for moderate pain.   linagliptin 5 MG Tabs tablet Commonly known as:  TRADJENTA Take 5 mg by mouth daily.   magnesium gluconate 500 MG tablet Commonly known as:  MAGONATE Take 500 mg by mouth daily as needed (for leg cramps).   omeprazole 40 MG capsule Commonly known as:  PRILOSEC Take 40 mg by mouth daily.   OXYGEN Inhale 1 L into the lungs at bedtime.   PARoxetine 30 MG tablet Commonly known as:  PAXIL Take 30 mg by mouth daily.   tiZANidine 2 MG tablet Commonly known as:  ZANAFLEX Take 2 mg by mouth at bedtime as needed for  muscle spasms.   VITAMIN B12 PO Take 1 tablet by mouth daily.   Vitamin D3 5000 units Caps Take 5,000 Units by mouth daily.   warfarin 5 MG tablet Commonly known as:  COUMADIN Take 2.5-5 mg by mouth See admin instructions. Take 5 mg by mouth daily on Monday, Wednesday and Friday. Take 2.5 mg by mouth daily on all other days      Follow-up Information    Clovis Riley, MD Follow up in 3 week(s).   Specialty:  General Surgery Contact information: [address] [place] [address] [phone]        Harlan Stains, MD Follow up in 2 day(s).   Specialty:  Family Medicine Why:  Call your PCP to arrange follow up this week for monitoring your INR as you go back on coumadin, as well as for management of your other medications.  Contact information: [place] [address] [phone]        Evans Lance, MD .   Specialty:  Cardiology Contact information: [phone] [address] [place] [address] [phone]           Signed: Clovis Riley 06/18/2017, 7:20 AM

## 2017-06-19 LAB — PARATHYROID HORMONE, INTACT (NO CA): PTH: 42 pg/mL (ref 15–65)

## 2017-06-21 DIAGNOSIS — Z7901 Long term (current) use of anticoagulants: Secondary | ICD-10-CM | POA: Diagnosis not present

## 2017-06-24 ENCOUNTER — Ambulatory Visit: Payer: Medicare HMO | Admitting: *Deleted

## 2017-06-24 DIAGNOSIS — I428 Other cardiomyopathies: Secondary | ICD-10-CM

## 2017-06-24 DIAGNOSIS — J438 Other emphysema: Secondary | ICD-10-CM | POA: Diagnosis not present

## 2017-06-24 DIAGNOSIS — Z95810 Presence of automatic (implantable) cardiac defibrillator: Secondary | ICD-10-CM

## 2017-06-24 DIAGNOSIS — I5022 Chronic systolic (congestive) heart failure: Secondary | ICD-10-CM

## 2017-06-24 NOTE — Progress Notes (Signed)
Remote pacemaker transmission.   

## 2017-06-25 ENCOUNTER — Ambulatory Visit (INDEPENDENT_AMBULATORY_CARE_PROVIDER_SITE_OTHER): Payer: Medicare HMO | Admitting: *Deleted

## 2017-06-25 ENCOUNTER — Telehealth: Payer: Self-pay

## 2017-06-25 DIAGNOSIS — Z950 Presence of cardiac pacemaker: Secondary | ICD-10-CM

## 2017-06-25 DIAGNOSIS — I5022 Chronic systolic (congestive) heart failure: Secondary | ICD-10-CM

## 2017-06-25 DIAGNOSIS — I480 Paroxysmal atrial fibrillation: Secondary | ICD-10-CM

## 2017-06-25 DIAGNOSIS — Z7901 Long term (current) use of anticoagulants: Secondary | ICD-10-CM | POA: Diagnosis not present

## 2017-06-25 LAB — CUP PACEART INCLINIC DEVICE CHECK
Battery Remaining Longevity: 97 mo
Battery Voltage: 3.02 V
Date Time Interrogation Session: 20190507163228
Implantable Lead Implant Date: 20110127
Implantable Lead Location: 753860
Implantable Lead Model: 7122
Lead Channel Impedance Value: 425 Ohm
Lead Channel Impedance Value: 812.5 Ohm
Lead Channel Pacing Threshold Amplitude: 0.5 V
Lead Channel Pacing Threshold Amplitude: 1 V
Lead Channel Pacing Threshold Amplitude: 1.5 V
Lead Channel Pacing Threshold Pulse Width: 0.5 ms
Lead Channel Pacing Threshold Pulse Width: 0.6 ms
Lead Channel Sensing Intrinsic Amplitude: 12 mV
Lead Channel Sensing Intrinsic Amplitude: 3.6 mV
Lead Channel Setting Pacing Amplitude: 2 V
Lead Channel Setting Pacing Amplitude: 2.5 V
Lead Channel Setting Pacing Pulse Width: 0.5 ms
Lead Channel Setting Pacing Pulse Width: 0.6 ms
MDC IDC LEAD IMPLANT DT: 20110127
MDC IDC LEAD IMPLANT DT: 20110127
MDC IDC LEAD LOCATION: 753858
MDC IDC LEAD LOCATION: 753859
MDC IDC MSMT LEADCHNL RA PACING THRESHOLD PULSEWIDTH: 0.5 ms
MDC IDC MSMT LEADCHNL RV IMPEDANCE VALUE: 487.5 Ohm
MDC IDC PG IMPLANT DT: 20190418
MDC IDC PG SERIAL: 9393517
MDC IDC SET LEADCHNL RA PACING AMPLITUDE: 2 V
MDC IDC SET LEADCHNL RV SENSING SENSITIVITY: 2 mV
MDC IDC STAT BRADY RA PERCENT PACED: 26 %

## 2017-06-25 NOTE — Telephone Encounter (Signed)
Remote ICM transmission received.  Attempted call to patient and left message to return call. 

## 2017-06-25 NOTE — Progress Notes (Signed)
EPIC Encounter for ICM Monitoring  Patient Name: Emily Abbott is a 77 y.o. female Date: 06/25/2017 Primary Care Physican: Harlan Stains, MD Primary Cardiologist:Taylor Electrophysiologist: Lovena Le Dry Weight: unknown Bi-V Pacing: 97% AT/AF Burden 6.2%       Attempted call to patient today to explained next ICM remote transmission will be in July so the fluid level baseline can develop. Patient has in office wound and defib check today.  Battery change was 4/18.   Thoracic impedance normal.  Prescribed dosage: Furosemide 20 mg 2 tablets (40 mg total) daily.   Labs: 06/18/2017 Creatinine 1.11, BUN 18, Potassium 4.2, Sodium 140, EGFR 46-54 06/14/2017 Creatinine 1.31, BUN 19, Potassium 5.1, Sodium 142, EGFR 38-44  05/31/2017 Creatinine 1.37, BUN 13, Potassium 4.7, Sodium 140, EGFR 37-43  04/08/2017 Creatinine 1.15, BUN 17, EGFR 46-53 06/21/2016 Creatinine 1.36, BUN 22, Potassium 3.6, Sodium 137, EGFR 38-46 from Dr Orest Dikes office 06/20/2015 Creatinine 1.12, BUN 22, Potassium 4.5, Sodium 141,EGFR 47(from Dr [facility] office).  Recommendations: None  Follow-up plan: ICM clinic phone appointment on 08/26/2017.  Office appointment scheduled 09/17/2017 with Dr. Lovena Le.  Copy of ICM check sent to Dr. Lovena Le.   3 month ICM trend: 06/25/2017    AT/AF     1 Year ICM trend:       [name], RN 06/25/2017 12:12 PM

## 2017-06-25 NOTE — Progress Notes (Signed)
Wound check appointment. Steri-strips removed. Wound without redness or edema. Incision edges approximated, wound well healed. Normal device function. Thresholds, sensing, and impedances consistent with implant measurements. Device programmed at chronic outputs. Histogram distribution appropriate for patient and level of activity. 1472 AT/AF episodes (5.8%), +warfarin. No high ventricular rates noted. 2 ventricular noise reversions--available EGM is from 06/17/17, correlates with L parathyroidectomy procedure at [facility]. Patient educated about wound care, arm mobility, lifting restrictions, and Merlin monitor. ROV with GT on 09/17/17.

## 2017-07-02 DIAGNOSIS — R69 Illness, unspecified: Secondary | ICD-10-CM | POA: Diagnosis not present

## 2017-07-03 DIAGNOSIS — Z7901 Long term (current) use of anticoagulants: Secondary | ICD-10-CM | POA: Diagnosis not present

## 2017-07-08 LAB — CUP PACEART REMOTE DEVICE CHECK
Battery Remaining Percentage: 95.5 %
Brady Statistic AP VP Percent: 28 %
Brady Statistic AS VP Percent: 72 %
Date Time Interrogation Session: 20190506131328
Implantable Lead Implant Date: 20110127
Implantable Lead Implant Date: 20110127
Implantable Lead Location: 753859
Implantable Lead Model: 7122
Lead Channel Impedance Value: 530 Ohm
Lead Channel Pacing Threshold Amplitude: 1 V
Lead Channel Pacing Threshold Pulse Width: 0.5 ms
Lead Channel Pacing Threshold Pulse Width: 0.6 ms
Lead Channel Sensing Intrinsic Amplitude: 12 mV
Lead Channel Sensing Intrinsic Amplitude: 3.2 mV
Lead Channel Setting Pacing Amplitude: 2 V
Lead Channel Setting Pacing Amplitude: 2 V
MDC IDC LEAD IMPLANT DT: 20110127
MDC IDC LEAD LOCATION: 753858
MDC IDC LEAD LOCATION: 753860
MDC IDC MSMT BATTERY REMAINING LONGEVITY: 100 mo
MDC IDC MSMT BATTERY VOLTAGE: 3.02 V
MDC IDC MSMT LEADCHNL LV IMPEDANCE VALUE: 900 Ohm
MDC IDC MSMT LEADCHNL LV PACING THRESHOLD AMPLITUDE: 1.5 V
MDC IDC MSMT LEADCHNL RA IMPEDANCE VALUE: 450 Ohm
MDC IDC MSMT LEADCHNL RA PACING THRESHOLD PULSEWIDTH: 0.4 ms
MDC IDC MSMT LEADCHNL RV PACING THRESHOLD AMPLITUDE: 0.625 V
MDC IDC PG IMPLANT DT: 20190418
MDC IDC PG SERIAL: 9393517
MDC IDC SET LEADCHNL LV PACING AMPLITUDE: 2.5 V
MDC IDC SET LEADCHNL LV PACING PULSEWIDTH: 0.6 ms
MDC IDC SET LEADCHNL RV PACING PULSEWIDTH: 0.5 ms
MDC IDC SET LEADCHNL RV SENSING SENSITIVITY: 2 mV
MDC IDC STAT BRADY AP VS PERCENT: 1 %
MDC IDC STAT BRADY AS VS PERCENT: 1 %
MDC IDC STAT BRADY RA PERCENT PACED: 25 %
Pulse Gen Model: 3222

## 2017-07-25 DIAGNOSIS — J438 Other emphysema: Secondary | ICD-10-CM | POA: Diagnosis not present

## 2017-07-31 ENCOUNTER — Other Ambulatory Visit: Payer: Self-pay | Admitting: Cardiovascular Disease

## 2017-07-31 DIAGNOSIS — I480 Paroxysmal atrial fibrillation: Secondary | ICD-10-CM

## 2017-08-03 DIAGNOSIS — R69 Illness, unspecified: Secondary | ICD-10-CM | POA: Diagnosis not present

## 2017-08-05 DIAGNOSIS — Z7901 Long term (current) use of anticoagulants: Secondary | ICD-10-CM | POA: Diagnosis not present

## 2017-08-19 DIAGNOSIS — N183 Chronic kidney disease, stage 3 (moderate): Secondary | ICD-10-CM | POA: Diagnosis not present

## 2017-08-19 DIAGNOSIS — I480 Paroxysmal atrial fibrillation: Secondary | ICD-10-CM | POA: Diagnosis not present

## 2017-08-19 DIAGNOSIS — E1121 Type 2 diabetes mellitus with diabetic nephropathy: Secondary | ICD-10-CM | POA: Diagnosis not present

## 2017-08-19 DIAGNOSIS — G894 Chronic pain syndrome: Secondary | ICD-10-CM | POA: Diagnosis not present

## 2017-08-19 DIAGNOSIS — E785 Hyperlipidemia, unspecified: Secondary | ICD-10-CM | POA: Diagnosis not present

## 2017-08-19 DIAGNOSIS — J449 Chronic obstructive pulmonary disease, unspecified: Secondary | ICD-10-CM | POA: Diagnosis not present

## 2017-08-19 DIAGNOSIS — I130 Hypertensive heart and chronic kidney disease with heart failure and stage 1 through stage 4 chronic kidney disease, or unspecified chronic kidney disease: Secondary | ICD-10-CM | POA: Diagnosis not present

## 2017-08-19 DIAGNOSIS — R69 Illness, unspecified: Secondary | ICD-10-CM | POA: Diagnosis not present

## 2017-08-19 DIAGNOSIS — Z7901 Long term (current) use of anticoagulants: Secondary | ICD-10-CM | POA: Diagnosis not present

## 2017-08-24 DIAGNOSIS — J438 Other emphysema: Secondary | ICD-10-CM | POA: Diagnosis not present

## 2017-08-26 ENCOUNTER — Telehealth: Payer: Self-pay | Admitting: Internal Medicine

## 2017-08-26 ENCOUNTER — Ambulatory Visit (INDEPENDENT_AMBULATORY_CARE_PROVIDER_SITE_OTHER): Payer: Medicare HMO

## 2017-08-26 DIAGNOSIS — Z950 Presence of cardiac pacemaker: Secondary | ICD-10-CM | POA: Diagnosis not present

## 2017-08-26 DIAGNOSIS — I5022 Chronic systolic (congestive) heart failure: Secondary | ICD-10-CM | POA: Diagnosis not present

## 2017-08-26 NOTE — Progress Notes (Signed)
EPIC Encounter for ICM Monitoring  Patient Name: Emily Abbott is a 77 y.o. female Date: 08/26/2017 Primary Care Physican: Harlan Stains, MD Primary Cardiologist:Taylor Electrophysiologist: Lovena Le Dry Weight: unknown Bi-V Pacing: 82%  AT/AF Burden40% (was 3.6% on 6/3 transmission)       Attempted call to patient and unable to reach.  Left message to return call.  Transmission reviewed.    Thoracic impedance normal.  Prescribed dosage: Furosemide 20 mg 2 tablets (40 mg total) daily.   Labs: 06/18/2017 Creatinine 1.11, BUN 18, Potassium 4.2, Sodium 140, EGFR 46-54 06/14/2017 Creatinine 1.31, BUN 19, Potassium 5.1, Sodium 142, EGFR 38-44  05/31/2017 Creatinine 1.37, BUN 13, Potassium 4.7, Sodium 140, EGFR 37-43  04/08/2017 Creatinine 1.15, BUN 17, EGFR 46-53  Recommendations: NONE - Unable to reach.  Follow-up plan: ICM clinic phone appointment on 10/18/2017.    Appointments: Office appointment scheduled 09/17/2017 with Dr. Lovena Le.  Copy of ICM check sent to Dr. Lovena Le.   3 month ICM trend: 08/25/2017    AT/AF      1 Year ICM trend:       [name], RN 08/26/2017 1:55 PM

## 2017-08-26 NOTE — Telephone Encounter (Signed)
New message     1. What are your last 5 BP readings? 105/74  HR 81,  90/61 today HR 82  2. Are you having any other symptoms (ex. Dizziness, headache, blurred vision, passed out)? Dizziness, headache 3. What is your BP issue? Patient states her BP is too low when standing

## 2017-08-27 ENCOUNTER — Telehealth: Payer: Self-pay

## 2017-08-27 NOTE — Telephone Encounter (Signed)
Returned call to Pt.  Pt states she has had some "swimmy headedness" when she goes from sitting to standing.  Advised Pt to increase fluid intake and continue monitor her blood pressures.  Per Pt she has noticed that the "swimminess" seems to have started since her gen change.  Pt may need device reprogramming.  Pt states she will increase fluids, if she continues to be symptomatic will call office to schedule sooner f/u appt.  Pt indicates agreement.

## 2017-08-27 NOTE — Telephone Encounter (Signed)
Remote ICM transmission received.  Attempted call to patient and left message to return call. 

## 2017-09-03 DIAGNOSIS — R69 Illness, unspecified: Secondary | ICD-10-CM | POA: Diagnosis not present

## 2017-09-04 DIAGNOSIS — M170 Bilateral primary osteoarthritis of knee: Secondary | ICD-10-CM | POA: Diagnosis not present

## 2017-09-17 ENCOUNTER — Encounter: Payer: Self-pay | Admitting: Internal Medicine

## 2017-09-17 ENCOUNTER — Ambulatory Visit (INDEPENDENT_AMBULATORY_CARE_PROVIDER_SITE_OTHER): Payer: Medicare HMO | Admitting: Internal Medicine

## 2017-09-17 VITALS — BP 126/72 | HR 95 | Ht 66.5 in | Wt 205.0 lb

## 2017-09-17 DIAGNOSIS — I5022 Chronic systolic (congestive) heart failure: Secondary | ICD-10-CM

## 2017-09-17 DIAGNOSIS — Z7901 Long term (current) use of anticoagulants: Secondary | ICD-10-CM | POA: Diagnosis not present

## 2017-09-17 DIAGNOSIS — Z95810 Presence of automatic (implantable) cardiac defibrillator: Secondary | ICD-10-CM

## 2017-09-17 DIAGNOSIS — I480 Paroxysmal atrial fibrillation: Secondary | ICD-10-CM

## 2017-09-17 DIAGNOSIS — I484 Atypical atrial flutter: Secondary | ICD-10-CM

## 2017-09-17 NOTE — Patient Instructions (Addendum)
Medication Instructions:  Your physician has recommended you make the following change in your medication: 1.  Stop taking dofetilide (Tikosyn)  Labwork: None ordered.  Testing/Procedures: None ordered.  Follow-Up: Your physician wants you to follow-up in: 9 months with Dr. Lovena Le.   You will receive a reminder letter in the mail two months in advance. If you don't receive a letter, please call our office to schedule the follow-up appointment.  Remote monitoring is used to monitor your ICD from home. This monitoring reduces the number of office visits required to check your device to one time per year. It allows [place] to keep an eye on the functioning of your device to ensure it is working properly. You are scheduled for a device check from home on 10/18/2017. You may send your transmission at any time that day. If you have a wireless device, the transmission will be sent automatically. After your physician reviews your transmission, you will receive a postcard with your next transmission date.  Any Other Special Instructions Will Be Listed Below (If Applicable).  If you need a refill on your cardiac medications before your next appointment, please call your pharmacy.

## 2017-09-17 NOTE — Progress Notes (Signed)
HPI [name] returns for ongoing ICD followup. She is a pleasant 77yo woman with a h/o CAD, s/p MI, chronic systolic heart failure, s/p BiV ICD. She has been stable. She has occaisional palpitations. She notes dyspnea with exertion. Mild peripheral edema which she treats with diuretic therapy. No ICD shocks. Her blood pressure has been under bettercontrol. She has a h/o PAFand has been noted to have increasingly frequent episodes despite medical therapy with dofetilide. She denies chest pain. She has required adjustment of her diuretic dose due to her propensity for volume overload.her EF had normalized by echo 2.5 years ago. She has never had VT  Allergies  Allergen Reactions  . Adhesive [Tape] Other (See Comments)    Burning sensation  . Atorvastatin     Makes legs hurt   . Metformin And Related     "caused me kidney problems"   . Sulfonamide Derivatives Other (See Comments)    Unknown     Current Outpatient Medications  Medication Sig Dispense Refill  . acetaminophen (TYLENOL) 325 MG tablet Take 2 tablets (650 mg total) by mouth every 6 (six) hours as needed for mild pain or moderate pain.    Marland Kitchen albuterol (PROAIR HFA) 108 (90 Base) MCG/ACT inhaler Inhale 1-2 puffs into the lungs every 6 (six) hours as needed for wheezing or shortness of breath. 18 g 5  . ALPRAZolam (XANAX) 0.5 MG tablet Take 0.5 mg by mouth 2 (two) times daily as needed for anxiety.     Marland Kitchen amLODipine (NORVASC) 5 MG tablet Take 5 mg by mouth daily.    Marland Kitchen atorvastatin (LIPITOR) 20 MG tablet Take 20 mg by mouth daily.    . Azelastine HCl 137 MCG/SPRAY SOLN Place 2 sprays into both nostrils at bedtime.    Marland Kitchen BREO ELLIPTA 100-25 MCG/INH AEPB INHALE 1 PUFF DAILY (Patient not taking: Reported on 06/04/2017) 60 each 0  . carvedilol (COREG) 6.25 MG tablet Take 6.25 mg by mouth 2 (two) times daily with a meal.    . Cholecalciferol (VITAMIN D3) 5000 units CAPS Take 5,000 Units by mouth daily.    . Cyanocobalamin  (VITAMIN B12 PO) Take 1 tablet by mouth daily.    . diphenhydrAMINE (BENADRYL) 25 MG tablet Take 12.5 mg by mouth daily as needed for itching.    . dofetilide (TIKOSYN) 250 MCG capsule TAKE 1 CAPSULE BY MOUTH TWICE A DAY 60 capsule 10  . ezetimibe (ZETIA) 10 MG tablet Take 10 mg by mouth daily.    . furosemide (LASIX) 20 MG tablet TAKE 2 TABLETS BY MOUTH EVERY DAY (Patient taking differently: TAKE 40 MG BY MOUTH EVERY DAY) 180 tablet 2  . glimepiride (AMARYL) 1 MG tablet Take 1 mg by mouth daily.  5  . HYDROcodone-acetaminophen (NORCO/VICODIN) 5-325 MG tablet Take 1 tablet by mouth every 6 (six) hours as needed for moderate pain.    Marland Kitchen linagliptin (TRADJENTA) 5 MG TABS tablet Take 5 mg by mouth daily.    . magnesium gluconate (MAGONATE) 500 MG tablet Take 500 mg by mouth daily as needed (for leg cramps).     Marland Kitchen omeprazole (PRILOSEC) 40 MG capsule Take 40 mg by mouth daily.      . OXYGEN Inhale 1 L into the lungs at bedtime.    Marland Kitchen PARoxetine (PAXIL) 30 MG tablet Take 30 mg by mouth daily.      . Suvorexant (BELSOMRA) 10 MG TABS Take 10 mg by mouth at bedtime as needed (for sleep).    Marland Kitchen  tiZANidine (ZANAFLEX) 2 MG tablet Take 2 mg by mouth at bedtime as needed for muscle spasms.     Marland Kitchen warfarin (COUMADIN) 5 MG tablet Take 2.5-5 mg by mouth See admin instructions. Take 5 mg by mouth daily on Monday, Wednesday and Friday. Take 2.5 mg by mouth daily on all other days     No current facility-administered medications for this visit.      Past Medical History:  Diagnosis Date  . Anemia   . Anxiety   . CAD (coronary artery disease)   . Chronic kidney disease    CKD stage 3   . COPD (chronic obstructive pulmonary disease) (HCC)    uses  1 to and1.5 L of o2 at bedtime ; has not seen her pulm doctor in over a year ; has not had to uses inhlaer in a over a year    . Diabetes mellitus    type II  . GERD (gastroesophageal reflux disease)   . HTN (hypertension)    essential  . Hyperlipidemia, mixed   .  Mitral valve disorder   . Osteoarthritis   . Panic attack   . Paroxysmal atrial fibrillation (HCC)   . Pneumonia    2010  . Pulmonary embolism ([place]) 2010  . Sleep apnea    does not uses cpap since she got a URI from it. has home 02 as needed    ROS:   All systems reviewed and negative except as noted in the HPI.   Past Surgical History:  Procedure Laterality Date  . BIOPSY BREAST    . BIV ICD GENERATOR CHANGEOUT N/A 06/06/2017   Procedure: BIV ICD GENERATOR CHANGEOUT;  Surgeon: Evans Lance, MD;  Location: [facility];  Service: Cardiovascular;  Laterality: N/A;  . CARDIAC VALVE SURGERY     mitral  . carpal tennel  2003  . EYE SURGERY  2015  or 2016   cataract surgery   . PARATHYROIDECTOMY Left 06/17/2017   Procedure: LEFT UPPER PARATHYROIDECTOMY;  Surgeon: Clovis Riley, MD;  Location: [facility];  Service: General;  Laterality: Left;  . TUBAL LIGATION      over 60 years ago      Family History  Problem Relation Age of Onset  . Stroke Mother   . Anuerysm Father   . Diabetes Sister   . Cancer Brother   . Ulcers Sister      Social History   Socioeconomic History  . Marital status: Widowed    Spouse name: Not on file  . Number of children: Not on file  . Years of education: Not on file  . Highest education level: Not on file  Occupational History  . Occupation: reitred  Social Needs  . Financial resource strain: Not on file  . Food insecurity:    Worry: Not on file    Inability: Not on file  . Transportation needs:    Medical: Not on file    Non-medical: Not on file  Tobacco Use  . Smoking status: Former Smoker    Packs/day: 1.00    Years: 50.00    Pack years: 50.00    Types: Cigarettes    Last attempt to quit: 02/19/2001    Years since quitting: 16.5  . Smokeless tobacco: Never Used  Substance and Sexual Activity  . Alcohol use: No    Alcohol/week: 0.0 oz  . Drug use: Never  . Sexual activity: Not on file  Lifestyle  . Physical  activity:  Days per week: Not on file    Minutes per session: Not on file  . Stress: Not on file  Relationships  . Social connections:    Talks on phone: Not on file    Gets together: Not on file    Attends religious service: Not on file    Active member of club or organization: Not on file    Attends meetings of clubs or organizations: Not on file    Relationship status: Not on file  . Intimate partner violence:    Fear of current or ex partner: Not on file    Emotionally abused: Not on file    Physically abused: Not on file    Forced sexual activity: Not on file  Other Topics Concern  . Not on file  Social History Narrative  . Not on file     BP 126/72   Pulse 95   Ht 5' 6.5" (1.689 m)   Wt 205 lb (93 kg)   BMI 32.59 kg/m   Physical Exam:  Well appearing 77 yo woman, NAD HEENT: Unremarkable Neck:  7 cm JVD, no thyromegally Lymphatics:  No adenopathy Back:  No CVA tenderness Lungs:  Clear with no wheezes HEART:  Regular rate rhythm, no murmurs, no rubs, no clicks Abd:  soft, positive bowel sounds, no organomegally, no rebound, no guarding Ext:  2 plus pulses, no edema, no cyanosis, no clubbing Skin:  No rashes no nodules Neuro:  CN II through XII intact, motor grossly intact  EKG - atypical atrial flutter  DEVICE  Normal device function.  See PaceArt for details.   Assess/Plan: 1. Atrial fib - she has been on dofetilide. 2. Atrial flutter - this is non-isthmus dependent. I have reviewed the treatment options. She is already of dofetilide and her lung function is not good for amiodarone. She will undergo rate control. We will stop dofetilide. 3. ICD - her [place] ICD is working normally. We paced her out of the flutter but she had ERAF. 4. Chronic systolic heart failure - her symptoms are class 3. She will continue her current meds. Her fluid status is stable.  Mikle Bosworth.D.

## 2017-09-18 NOTE — Addendum Note (Signed)
Addended by: Willeen Cass A on: 09/18/2017 01:43 PM   Modules accepted: Orders

## 2017-09-23 ENCOUNTER — Telehealth: Payer: Self-pay | Admitting: Internal Medicine

## 2017-09-23 NOTE — Telephone Encounter (Signed)
New Message        Pt c/o BP issue: STAT if pt c/o blurred vision, one-sided weakness or slurred speech  1. What are your last 5 BP readings?      145/85 09/23/2017       164/102 09/23/2017 @ noon        167/100 09/23/2017 @11 :00         166/101 09/23/2017 @ 10:45          130/77 09/22/2017  On yesterday  2. Are you having any other symptoms (ex. Dizziness, headache, blurred vision, passed out)? Dizziness   3. What is your BP issue? Elevated    Patient also states she is also having chest pressure.

## 2017-09-23 NOTE — Telephone Encounter (Signed)
Returned call to Pt.  Pt with a few elevated blood pressures yesterday 09/23/2017.  After discussion with Pt, Pt doesn't take her AM medications until between 10:00 am and 12:00 pm. First 3 blood pressures from yesterday were elevated, then blood pressure taken closer to 2:00 pm was 145/85 (presumably when am medications started to work).  Pt has had dizziness, which has been an ongoing issue she discussed with Dr. Lovena Le at last visit.  Pt states she had chest pressure when her blood pressures were high, it resolved when her blood pressure came back down.  Pt had been continuing to take dofetilide.  Advised Pt to discontinue the dofetilide as advised by Dr. Lovena Le.  Advised Pt to monitor BP's 2 hours daily after taking her medications and keep a log.  Will check in with patient next week to see if her dizziness has improved.  Will also discuss with Dr. Lovena Le.  Advised to call with continued chest pressure/elevated blood pressures.

## 2017-09-24 DIAGNOSIS — J438 Other emphysema: Secondary | ICD-10-CM | POA: Diagnosis not present

## 2017-09-29 DIAGNOSIS — R69 Illness, unspecified: Secondary | ICD-10-CM | POA: Diagnosis not present

## 2017-10-03 NOTE — Telephone Encounter (Signed)
Call returned to Pt.  Pt states since she has stopped the dofetilide she feels better, is having less "swimmy headedness".  She states she hasn't been regularly checking her blood pressure because she feels good.  Advised Pt to call office if she has any recurrent dizziness.

## 2017-10-04 LAB — CUP PACEART INCLINIC DEVICE CHECK
Battery Voltage: 3.02 V
Brady Statistic RA Percent Paced: 15 %
Brady Statistic RV Percent Paced: 71 %
Date Time Interrogation Session: 20190730181237
Implantable Lead Implant Date: 20110127
Implantable Lead Implant Date: 20110127
Implantable Lead Location: 753858
Implantable Lead Location: 753859
Lead Channel Impedance Value: 437.5 Ohm
Lead Channel Impedance Value: 837.5 Ohm
Lead Channel Pacing Threshold Amplitude: 0.5 V
Lead Channel Pacing Threshold Amplitude: 1.5 V
Lead Channel Pacing Threshold Pulse Width: 0.5 ms
Lead Channel Sensing Intrinsic Amplitude: 12 mV
Lead Channel Sensing Intrinsic Amplitude: 4.1 mV
Lead Channel Setting Pacing Amplitude: 2 V
Lead Channel Setting Pacing Amplitude: 2 V
Lead Channel Setting Pacing Pulse Width: 0.5 ms
Lead Channel Setting Pacing Pulse Width: 0.6 ms
Lead Channel Setting Sensing Sensitivity: 2 mV
MDC IDC LEAD IMPLANT DT: 20110127
MDC IDC LEAD LOCATION: 753860
MDC IDC MSMT BATTERY REMAINING LONGEVITY: 102 mo
MDC IDC MSMT LEADCHNL LV PACING THRESHOLD AMPLITUDE: 1.5 V
MDC IDC MSMT LEADCHNL LV PACING THRESHOLD PULSEWIDTH: 0.6 ms
MDC IDC MSMT LEADCHNL LV PACING THRESHOLD PULSEWIDTH: 0.6 ms
MDC IDC MSMT LEADCHNL RV IMPEDANCE VALUE: 525 Ohm
MDC IDC PG IMPLANT DT: 20190418
MDC IDC SET LEADCHNL LV PACING AMPLITUDE: 2.5 V
Pulse Gen Model: 3222
Pulse Gen Serial Number: 9393517

## 2017-10-08 NOTE — Progress Notes (Signed)
ICM remote transmission rescheduled to 10/28/2017 due to will be out of office 10/18/2017.  

## 2017-10-25 DIAGNOSIS — Z7901 Long term (current) use of anticoagulants: Secondary | ICD-10-CM | POA: Diagnosis not present

## 2017-10-25 DIAGNOSIS — J438 Other emphysema: Secondary | ICD-10-CM | POA: Diagnosis not present

## 2017-10-28 ENCOUNTER — Ambulatory Visit (INDEPENDENT_AMBULATORY_CARE_PROVIDER_SITE_OTHER): Payer: Medicare HMO

## 2017-10-28 ENCOUNTER — Telehealth: Payer: Self-pay

## 2017-10-28 DIAGNOSIS — I5022 Chronic systolic (congestive) heart failure: Secondary | ICD-10-CM

## 2017-10-28 DIAGNOSIS — Z95810 Presence of automatic (implantable) cardiac defibrillator: Secondary | ICD-10-CM

## 2017-10-28 NOTE — Telephone Encounter (Signed)
Spoke with pt and reminded pt of remote transmission that is due today. Pt verbalized understanding.   

## 2017-10-29 NOTE — Progress Notes (Signed)
Merlin ICM remote transmission was not generated for 10/28/2017 due to device temporarily suspended the use of RF/high speed telemetry in order to more accurately assess the remaining battery capacity. This is a safety feature designed to prolong the battery longevity of the device. This is most likely the result of frequent or prolonged communication with the device in recent days. Normal telemetry function is expected to resume shortly. Patient therapy is not affected by the limited telemetry, and the transmitter will continue to check the patient's device daily for alerts. While the device has suspended RF/high speed telemetry, only the first instance of an episode with alerts will be reported. Full follow-ups for conditions detected while patient does not have full telemetry function may be provided to [employer] once full telemetry resumes.  ICM Remote Transmission rescheduled for 11/28/2017.

## 2017-11-12 ENCOUNTER — Encounter: Payer: Medicare HMO | Admitting: Internal Medicine

## 2017-11-24 DIAGNOSIS — J438 Other emphysema: Secondary | ICD-10-CM | POA: Diagnosis not present

## 2017-11-28 ENCOUNTER — Ambulatory Visit (INDEPENDENT_AMBULATORY_CARE_PROVIDER_SITE_OTHER): Payer: Medicare HMO

## 2017-11-28 ENCOUNTER — Telehealth: Payer: Self-pay

## 2017-11-28 DIAGNOSIS — Z95810 Presence of automatic (implantable) cardiac defibrillator: Secondary | ICD-10-CM

## 2017-11-28 DIAGNOSIS — I5022 Chronic systolic (congestive) heart failure: Secondary | ICD-10-CM | POA: Diagnosis not present

## 2017-11-28 NOTE — Progress Notes (Signed)
EPIC Encounter for ICM Monitoring  Patient Name: Emily Abbott is a 77 y.o. female Date: 11/28/2017 Primary Care Physican: Harlan Stains, MD Primary Cardiologist:Taylor Electrophysiologist: Lovena Le Dry Weight: unknown Bi-V Pacing: 58%    AT/AF Burden: 80%     Attempted call to patient and unable to reach.  Left message to return call.  Transmission reviewed.    Thoracic impedance normal.  Prescribed: Furosemide 20 mg 2 tablets (40 mg total) daily.   Labs: 06/18/2017 Creatinine1.11Willis Modena, Potassium4.2, Sodium140, [id number] 06/14/2017 Creatinine1.31, BUN19, Potassium5.1, [id number], [id number]  05/31/2017 Creatinine1.37, BUN13, Potassium4.7, Sodium140, [id number] 04/08/2017 Creatinine 1.15, BUN 17, EGFR 46-53  Recommendations: Unable to reach.  Follow-up plan: ICM clinic phone appointment on 12/30/2017.    Copy of ICM check sent to Dr. Lovena Le.   3 month ICM trend: 11/28/2017    AT/AF   1 Year ICM trend:       [name], RN 11/28/2017 12:20 PM

## 2017-11-28 NOTE — Telephone Encounter (Signed)
Remote ICM transmission received.  Attempted call to patient and left message, per DPR, to return call.  

## 2017-11-28 NOTE — Progress Notes (Signed)
Patient returned call and stated she is feeling fine.  Denied any fluid symptoms.  Transmission reviewed.  No changes today and encouraged to call for fluid symptoms.  Next ICM remote transmission 12/30/2017.

## 2017-12-02 DIAGNOSIS — E785 Hyperlipidemia, unspecified: Secondary | ICD-10-CM | POA: Diagnosis not present

## 2017-12-02 DIAGNOSIS — R69 Illness, unspecified: Secondary | ICD-10-CM | POA: Diagnosis not present

## 2017-12-02 DIAGNOSIS — Z7901 Long term (current) use of anticoagulants: Secondary | ICD-10-CM | POA: Diagnosis not present

## 2017-12-02 DIAGNOSIS — G894 Chronic pain syndrome: Secondary | ICD-10-CM | POA: Diagnosis not present

## 2017-12-02 DIAGNOSIS — N183 Chronic kidney disease, stage 3 (moderate): Secondary | ICD-10-CM | POA: Diagnosis not present

## 2017-12-02 DIAGNOSIS — E1121 Type 2 diabetes mellitus with diabetic nephropathy: Secondary | ICD-10-CM | POA: Diagnosis not present

## 2017-12-02 DIAGNOSIS — I130 Hypertensive heart and chronic kidney disease with heart failure and stage 1 through stage 4 chronic kidney disease, or unspecified chronic kidney disease: Secondary | ICD-10-CM | POA: Diagnosis not present

## 2017-12-02 DIAGNOSIS — I429 Cardiomyopathy, unspecified: Secondary | ICD-10-CM | POA: Diagnosis not present

## 2017-12-02 DIAGNOSIS — E113299 Type 2 diabetes mellitus with mild nonproliferative diabetic retinopathy without macular edema, unspecified eye: Secondary | ICD-10-CM | POA: Diagnosis not present

## 2017-12-02 DIAGNOSIS — I480 Paroxysmal atrial fibrillation: Secondary | ICD-10-CM | POA: Diagnosis not present

## 2017-12-17 ENCOUNTER — Ambulatory Visit (INDEPENDENT_AMBULATORY_CARE_PROVIDER_SITE_OTHER): Payer: Medicare HMO | Admitting: *Deleted

## 2017-12-17 DIAGNOSIS — I428 Other cardiomyopathies: Secondary | ICD-10-CM

## 2017-12-17 NOTE — Progress Notes (Signed)
Remote pacemaker transmission.   

## 2017-12-18 DIAGNOSIS — Z7901 Long term (current) use of anticoagulants: Secondary | ICD-10-CM | POA: Diagnosis not present

## 2017-12-18 DIAGNOSIS — I480 Paroxysmal atrial fibrillation: Secondary | ICD-10-CM | POA: Diagnosis not present

## 2017-12-25 DIAGNOSIS — J438 Other emphysema: Secondary | ICD-10-CM | POA: Diagnosis not present

## 2017-12-30 ENCOUNTER — Ambulatory Visit (INDEPENDENT_AMBULATORY_CARE_PROVIDER_SITE_OTHER): Payer: Medicare HMO

## 2017-12-30 DIAGNOSIS — Z95810 Presence of automatic (implantable) cardiac defibrillator: Secondary | ICD-10-CM

## 2017-12-30 DIAGNOSIS — I5022 Chronic systolic (congestive) heart failure: Secondary | ICD-10-CM

## 2017-12-31 NOTE — Progress Notes (Signed)
EPIC Encounter for ICM Monitoring  Patient Name: Emily Abbott is a 77 y.o. female Date: 12/31/2017 Primary Care Physican: Harlan Stains, MD Primary Cardiologist:Taylor Electrophysiologist: Lovena Le Last Weight: 208 lbs (last office visit)  Bi-V Pacing: 69%           AT/AF Burden: 64%           Heart Failure questions reviewed, pt asymptomatic.   Thoracic impedance normal.   Prescribed: Furosemide 20 mg 2 tablets (40 mg total) daily.   Labs: 06/18/2017 Creatinine1.11Willis Modena, Potassium4.2, Sodium140, [id number] 06/14/2017 Creatinine1.31, BUN19, Potassium5.1, [id number], [id number]  05/31/2017 Creatinine1.37, BUN13, Potassium4.7, Sodium140, [id number] 04/08/2017 Creatinine 1.15, BUN 17, EGFR 46-53  Recommendations: No changes.   Encouraged to call for fluid symptoms.  Follow-up plan: ICM clinic phone appointment on 01/30/2018.    Copy of ICM check sent to Dr. Lovena Le.   3 month ICM trend: 12/30/2017    AT/AF    1 Year ICM trend:       [name], RN 12/31/2017 9:03 AM

## 2018-01-14 DIAGNOSIS — I480 Paroxysmal atrial fibrillation: Secondary | ICD-10-CM | POA: Diagnosis not present

## 2018-01-14 DIAGNOSIS — Z7901 Long term (current) use of anticoagulants: Secondary | ICD-10-CM | POA: Diagnosis not present

## 2018-01-15 DIAGNOSIS — Z6835 Body mass index (BMI) 35.0-35.9, adult: Secondary | ICD-10-CM | POA: Diagnosis not present

## 2018-01-15 DIAGNOSIS — Z9981 Dependence on supplemental oxygen: Secondary | ICD-10-CM | POA: Diagnosis not present

## 2018-01-15 DIAGNOSIS — J029 Acute pharyngitis, unspecified: Secondary | ICD-10-CM | POA: Diagnosis not present

## 2018-01-15 DIAGNOSIS — J441 Chronic obstructive pulmonary disease with (acute) exacerbation: Secondary | ICD-10-CM | POA: Diagnosis not present

## 2018-01-15 DIAGNOSIS — R6889 Other general symptoms and signs: Secondary | ICD-10-CM | POA: Diagnosis not present

## 2018-01-15 DIAGNOSIS — J9611 Chronic respiratory failure with hypoxia: Secondary | ICD-10-CM | POA: Diagnosis not present

## 2018-01-15 DIAGNOSIS — J069 Acute upper respiratory infection, unspecified: Secondary | ICD-10-CM | POA: Diagnosis not present

## 2018-01-24 DIAGNOSIS — J438 Other emphysema: Secondary | ICD-10-CM | POA: Diagnosis not present

## 2018-01-27 DIAGNOSIS — H353111 Nonexudative age-related macular degeneration, right eye, early dry stage: Secondary | ICD-10-CM | POA: Diagnosis not present

## 2018-01-27 DIAGNOSIS — Z961 Presence of intraocular lens: Secondary | ICD-10-CM | POA: Diagnosis not present

## 2018-01-27 DIAGNOSIS — E113291 Type 2 diabetes mellitus with mild nonproliferative diabetic retinopathy without macular edema, right eye: Secondary | ICD-10-CM | POA: Diagnosis not present

## 2018-01-27 DIAGNOSIS — H52203 Unspecified astigmatism, bilateral: Secondary | ICD-10-CM | POA: Diagnosis not present

## 2018-01-30 ENCOUNTER — Telehealth: Payer: Self-pay | Admitting: Cardiology

## 2018-01-30 NOTE — Telephone Encounter (Signed)
Spoke with pt and reminded pt of remote transmission that is due today. Pt verbalized understanding.   

## 2018-02-10 DIAGNOSIS — Z7901 Long term (current) use of anticoagulants: Secondary | ICD-10-CM | POA: Diagnosis not present

## 2018-02-10 DIAGNOSIS — J449 Chronic obstructive pulmonary disease, unspecified: Secondary | ICD-10-CM | POA: Diagnosis not present

## 2018-02-10 DIAGNOSIS — J0110 Acute frontal sinusitis, unspecified: Secondary | ICD-10-CM | POA: Diagnosis not present

## 2018-02-10 NOTE — Progress Notes (Signed)
No ICM remote transmission received for 01/30/2018 and next ICM transmission scheduled for 02/18/2018.    

## 2018-02-13 ENCOUNTER — Emergency Department (HOSPITAL_COMMUNITY): Payer: Medicare HMO

## 2018-02-13 ENCOUNTER — Observation Stay (HOSPITAL_COMMUNITY)
Admission: EM | Admit: 2018-02-13 | Discharge: 2018-02-16 | Disposition: A | Discharge status: Home / Self Care | Payer: Medicare HMO | Attending: Internal Medicine | Admitting: Internal Medicine

## 2018-02-13 ENCOUNTER — Other Ambulatory Visit: Payer: Self-pay

## 2018-02-13 DIAGNOSIS — R05 Cough: Secondary | ICD-10-CM | POA: Diagnosis not present

## 2018-02-13 DIAGNOSIS — G4733 Obstructive sleep apnea (adult) (pediatric): Secondary | ICD-10-CM | POA: Insufficient documentation

## 2018-02-13 DIAGNOSIS — E1122 Type 2 diabetes mellitus with diabetic chronic kidney disease: Secondary | ICD-10-CM | POA: Insufficient documentation

## 2018-02-13 DIAGNOSIS — I130 Hypertensive heart and chronic kidney disease with heart failure and stage 1 through stage 4 chronic kidney disease, or unspecified chronic kidney disease: Secondary | ICD-10-CM | POA: Insufficient documentation

## 2018-02-13 DIAGNOSIS — Z86711 Personal history of pulmonary embolism: Secondary | ICD-10-CM | POA: Diagnosis not present

## 2018-02-13 DIAGNOSIS — Z7901 Long term (current) use of anticoagulants: Secondary | ICD-10-CM | POA: Diagnosis not present

## 2018-02-13 DIAGNOSIS — R0689 Other abnormalities of breathing: Secondary | ICD-10-CM | POA: Diagnosis not present

## 2018-02-13 DIAGNOSIS — J441 Chronic obstructive pulmonary disease with (acute) exacerbation: Secondary | ICD-10-CM | POA: Diagnosis not present

## 2018-02-13 DIAGNOSIS — F410 Panic disorder [episodic paroxysmal anxiety] without agoraphobia: Secondary | ICD-10-CM | POA: Diagnosis not present

## 2018-02-13 DIAGNOSIS — Z95810 Presence of automatic (implantable) cardiac defibrillator: Secondary | ICD-10-CM

## 2018-02-13 DIAGNOSIS — Z9981 Dependence on supplemental oxygen: Secondary | ICD-10-CM | POA: Insufficient documentation

## 2018-02-13 DIAGNOSIS — I2510 Atherosclerotic heart disease of native coronary artery without angina pectoris: Secondary | ICD-10-CM | POA: Insufficient documentation

## 2018-02-13 DIAGNOSIS — Z7984 Long term (current) use of oral hypoglycemic drugs: Secondary | ICD-10-CM | POA: Insufficient documentation

## 2018-02-13 DIAGNOSIS — R0602 Shortness of breath: Secondary | ICD-10-CM | POA: Diagnosis not present

## 2018-02-13 DIAGNOSIS — D72829 Elevated white blood cell count, unspecified: Secondary | ICD-10-CM | POA: Insufficient documentation

## 2018-02-13 DIAGNOSIS — M1990 Unspecified osteoarthritis, unspecified site: Secondary | ICD-10-CM | POA: Insufficient documentation

## 2018-02-13 DIAGNOSIS — R062 Wheezing: Secondary | ICD-10-CM | POA: Diagnosis not present

## 2018-02-13 DIAGNOSIS — I10 Essential (primary) hypertension: Secondary | ICD-10-CM | POA: Diagnosis not present

## 2018-02-13 DIAGNOSIS — I429 Cardiomyopathy, unspecified: Secondary | ICD-10-CM | POA: Diagnosis not present

## 2018-02-13 DIAGNOSIS — K219 Gastro-esophageal reflux disease without esophagitis: Secondary | ICD-10-CM | POA: Insufficient documentation

## 2018-02-13 DIAGNOSIS — I5022 Chronic systolic (congestive) heart failure: Secondary | ICD-10-CM

## 2018-02-13 DIAGNOSIS — E782 Mixed hyperlipidemia: Secondary | ICD-10-CM | POA: Diagnosis not present

## 2018-02-13 DIAGNOSIS — R0902 Hypoxemia: Secondary | ICD-10-CM | POA: Diagnosis not present

## 2018-02-13 DIAGNOSIS — E119 Type 2 diabetes mellitus without complications: Secondary | ICD-10-CM

## 2018-02-13 DIAGNOSIS — I4891 Unspecified atrial fibrillation: Secondary | ICD-10-CM | POA: Diagnosis present

## 2018-02-13 DIAGNOSIS — I480 Paroxysmal atrial fibrillation: Secondary | ICD-10-CM | POA: Diagnosis not present

## 2018-02-13 DIAGNOSIS — Z882 Allergy status to sulfonamides status: Secondary | ICD-10-CM | POA: Insufficient documentation

## 2018-02-13 DIAGNOSIS — R0600 Dyspnea, unspecified: Secondary | ICD-10-CM | POA: Diagnosis not present

## 2018-02-13 DIAGNOSIS — N183 Chronic kidney disease, stage 3 (moderate): Secondary | ICD-10-CM | POA: Insufficient documentation

## 2018-02-13 DIAGNOSIS — Z86718 Personal history of other venous thrombosis and embolism: Secondary | ICD-10-CM

## 2018-02-13 DIAGNOSIS — J9621 Acute and chronic respiratory failure with hypoxia: Secondary | ICD-10-CM | POA: Diagnosis present

## 2018-02-13 DIAGNOSIS — Z87891 Personal history of nicotine dependence: Secondary | ICD-10-CM | POA: Insufficient documentation

## 2018-02-13 DIAGNOSIS — R230 Cyanosis: Secondary | ICD-10-CM | POA: Diagnosis not present

## 2018-02-13 LAB — CBC WITH DIFFERENTIAL/PLATELET
ABS IMMATURE GRANULOCYTES: 0.04 10*3/uL (ref 0.00–0.07)
BASOS PCT: 1 %
Basophils Absolute: 0.1 10*3/uL (ref 0.0–0.1)
Eosinophils Absolute: 0.4 10*3/uL (ref 0.0–0.5)
Eosinophils Relative: 4 %
HCT: 37.2 % (ref 36.0–46.0)
Hemoglobin: 11 g/dL — ABNORMAL LOW (ref 12.0–15.0)
IMMATURE GRANULOCYTES: 0 %
Lymphocytes Relative: 14 %
Lymphs Abs: 1.5 10*3/uL (ref 0.7–4.0)
MCH: 24.3 pg — ABNORMAL LOW (ref 26.0–34.0)
MCHC: 29.6 g/dL — ABNORMAL LOW (ref 30.0–36.0)
MCV: 82.3 fL (ref 80.0–100.0)
MONO ABS: 0.7 10*3/uL (ref 0.1–1.0)
Monocytes Relative: 7 %
NEUTROS ABS: 7.6 10*3/uL (ref 1.7–7.7)
NEUTROS PCT: 74 %
PLATELETS: 214 10*3/uL (ref 150–400)
RBC: 4.52 MIL/uL (ref 3.87–5.11)
RDW: 16 % — AB (ref 11.5–15.5)
WBC: 10.2 10*3/uL (ref 4.0–10.5)
nRBC: 0 % (ref 0.0–0.2)

## 2018-02-13 LAB — GLUCOSE, CAPILLARY: Glucose-Capillary: 463 mg/dL — ABNORMAL HIGH (ref 70–99)

## 2018-02-13 LAB — BRAIN NATRIURETIC PEPTIDE: B NATRIURETIC PEPTIDE 5: 237.1 pg/mL — AB (ref 0.0–100.0)

## 2018-02-13 LAB — COMPREHENSIVE METABOLIC PANEL
ALT: 16 U/L (ref 0–44)
ANION GAP: 13 (ref 5–15)
AST: 20 U/L (ref 15–41)
Albumin: 3.7 g/dL (ref 3.5–5.0)
Alkaline Phosphatase: 55 U/L (ref 38–126)
BUN: 16 mg/dL (ref 8–23)
CHLORIDE: 102 mmol/L (ref 98–111)
CO2: 21 mmol/L — ABNORMAL LOW (ref 22–32)
Calcium: 9.2 mg/dL (ref 8.9–10.3)
Creatinine, Ser: 1.39 mg/dL — ABNORMAL HIGH (ref 0.44–1.00)
GFR calc Af Amer: 42 mL/min — ABNORMAL LOW (ref >60)
GFR, EST NON AFRICAN AMERICAN: 36 mL/min — AB (ref >60)
Glucose, Bld: 227 mg/dL — ABNORMAL HIGH (ref 70–99)
POTASSIUM: 3.8 mmol/L (ref 3.5–5.1)
Sodium: 136 mmol/L (ref 135–145)
Total Bilirubin: 0.6 mg/dL (ref 0.3–1.2)
Total Protein: 7.1 g/dL (ref 6.5–8.1)

## 2018-02-13 LAB — URINALYSIS, ROUTINE W REFLEX MICROSCOPIC
BILIRUBIN URINE: NEGATIVE
GLUCOSE, UA: NEGATIVE mg/dL
Ketones, ur: NEGATIVE mg/dL
LEUKOCYTES UA: NEGATIVE
NITRITE: NEGATIVE
PH: 5 (ref 5.0–8.0)
Protein, ur: NEGATIVE mg/dL
SPECIFIC GRAVITY, URINE: 1.013 (ref 1.005–1.030)

## 2018-02-13 LAB — INFLUENZA PANEL BY PCR (TYPE A & B)
Influenza A By PCR: NEGATIVE
Influenza B By PCR: NEGATIVE

## 2018-02-13 LAB — PROTIME-INR
INR: 1.44
Prothrombin Time: 17.4 seconds — ABNORMAL HIGH (ref 11.4–15.2)

## 2018-02-13 LAB — TROPONIN I

## 2018-02-13 MED ORDER — ATORVASTATIN CALCIUM 10 MG PO TABS
5.0000 mg | ORAL_TABLET | Freq: Every day | ORAL | Status: DC
  Administered 2018-02-14 – 2018-02-15 (×2): 5 mg via ORAL
  Filled 2018-02-13 (×2): qty 1

## 2018-02-13 MED ORDER — DILTIAZEM HCL 25 MG/5ML IV SOLN
10.0000 mg | Freq: Once | INTRAVENOUS | Status: AC
  Administered 2018-02-13: 10 mg via INTRAVENOUS
  Filled 2018-02-13: qty 5

## 2018-02-13 MED ORDER — WARFARIN - PHARMACIST DOSING INPATIENT: Freq: Every day | Status: DC

## 2018-02-13 MED ORDER — EZETIMIBE 10 MG PO TABS
10.0000 mg | ORAL_TABLET | Freq: Every day | ORAL | Status: DC
  Administered 2018-02-14 – 2018-02-16 (×3): 10 mg via ORAL
  Filled 2018-02-13 (×3): qty 1

## 2018-02-13 MED ORDER — INSULIN ASPART 100 UNIT/ML ~~LOC~~ SOLN
10.0000 [IU] | Freq: Once | SUBCUTANEOUS | Status: AC
  Administered 2018-02-13: 10 [IU] via SUBCUTANEOUS

## 2018-02-13 MED ORDER — INSULIN ASPART 100 UNIT/ML ~~LOC~~ SOLN
0.0000 [IU] | Freq: Three times a day (TID) | SUBCUTANEOUS | Status: DC
  Administered 2018-02-14: 5 [IU] via SUBCUTANEOUS
  Administered 2018-02-14: 11 [IU] via SUBCUTANEOUS
  Administered 2018-02-14: 8 [IU] via SUBCUTANEOUS
  Administered 2018-02-15: 3 [IU] via SUBCUTANEOUS
  Administered 2018-02-15 (×2): 5 [IU] via SUBCUTANEOUS
  Administered 2018-02-16: 2 [IU] via SUBCUTANEOUS

## 2018-02-13 MED ORDER — FLUTICASONE-UMECLIDIN-VILANT 100-62.5-25 MCG/INH IN AEPB: 1.0000 | INHALATION_SPRAY | Freq: Every day | RESPIRATORY_TRACT | Status: DC

## 2018-02-13 MED ORDER — PREDNISONE 20 MG PO TABS
40.0000 mg | ORAL_TABLET | Freq: Every day | ORAL | Status: DC
  Administered 2018-02-14 – 2018-02-16 (×3): 40 mg via ORAL
  Filled 2018-02-13 (×3): qty 2

## 2018-02-13 MED ORDER — ACETAMINOPHEN 325 MG PO TABS: 650.0000 mg | ORAL_TABLET | Freq: Four times a day (QID) | ORAL | Status: DC | PRN

## 2018-02-13 MED ORDER — HYDROCODONE-HOMATROPINE 5-1.5 MG/5ML PO SYRP
5.0000 mL | ORAL_SOLUTION | Freq: Four times a day (QID) | ORAL | Status: DC | PRN
  Administered 2018-02-14 – 2018-02-16 (×7): 5 mL via ORAL
  Filled 2018-02-13 (×7): qty 5

## 2018-02-13 MED ORDER — PAROXETINE HCL 30 MG PO TABS
30.0000 mg | ORAL_TABLET | Freq: Every day | ORAL | Status: DC
  Administered 2018-02-14 – 2018-02-16 (×3): 30 mg via ORAL
  Filled 2018-02-13 (×3): qty 1

## 2018-02-13 MED ORDER — SODIUM CHLORIDE 0.9 % IV SOLN
1.0000 g | INTRAVENOUS | Status: DC
  Administered 2018-02-13 – 2018-02-15 (×3): 1 g via INTRAVENOUS
  Filled 2018-02-13 (×3): qty 10

## 2018-02-13 MED ORDER — ALBUTEROL (5 MG/ML) CONTINUOUS INHALATION SOLN
10.0000 mg/h | INHALATION_SOLUTION | RESPIRATORY_TRACT | Status: DC
  Administered 2018-02-13: 10 mg/h via RESPIRATORY_TRACT
  Filled 2018-02-13: qty 20

## 2018-02-13 MED ORDER — AMLODIPINE BESYLATE 5 MG PO TABS
5.0000 mg | ORAL_TABLET | Freq: Every day | ORAL | Status: DC
  Administered 2018-02-14 – 2018-02-16 (×3): 5 mg via ORAL
  Filled 2018-02-13 (×3): qty 1

## 2018-02-13 MED ORDER — LINAGLIPTIN 5 MG PO TABS
5.0000 mg | ORAL_TABLET | Freq: Every day | ORAL | Status: DC
  Administered 2018-02-14 – 2018-02-16 (×3): 5 mg via ORAL
  Filled 2018-02-13 (×3): qty 1

## 2018-02-13 MED ORDER — CARVEDILOL 12.5 MG PO TABS
6.2500 mg | ORAL_TABLET | Freq: Two times a day (BID) | ORAL | Status: DC
  Administered 2018-02-14 – 2018-02-16 (×5): 6.25 mg via ORAL
  Filled 2018-02-13 (×5): qty 1

## 2018-02-13 MED ORDER — TIZANIDINE HCL 2 MG PO TABS
2.0000 mg | ORAL_TABLET | Freq: Every evening | ORAL | Status: DC | PRN
  Filled 2018-02-13: qty 1

## 2018-02-13 MED ORDER — UMECLIDINIUM BROMIDE 62.5 MCG/INH IN AEPB
1.0000 | INHALATION_SPRAY | Freq: Every day | RESPIRATORY_TRACT | Status: DC
  Administered 2018-02-14: 1 via RESPIRATORY_TRACT
  Filled 2018-02-13: qty 7

## 2018-02-13 MED ORDER — FLUTICASONE FUROATE-VILANTEROL 100-25 MCG/INH IN AEPB
1.0000 | INHALATION_SPRAY | Freq: Every day | RESPIRATORY_TRACT | Status: DC
  Administered 2018-02-14: 1 via RESPIRATORY_TRACT
  Filled 2018-02-13: qty 28

## 2018-02-13 MED ORDER — HEPARIN (PORCINE) 25000 UT/250ML-% IV SOLN
1150.0000 [IU]/h | INTRAVENOUS | Status: DC
  Administered 2018-02-13 – 2018-02-15 (×2): 1150 [IU]/h via INTRAVENOUS
  Filled 2018-02-13 (×3): qty 250

## 2018-02-13 MED ORDER — PANTOPRAZOLE SODIUM 40 MG PO TBEC
40.0000 mg | DELAYED_RELEASE_TABLET | Freq: Every day | ORAL | Status: DC
  Administered 2018-02-14 – 2018-02-16 (×3): 40 mg via ORAL
  Filled 2018-02-13 (×3): qty 1

## 2018-02-13 MED ORDER — WARFARIN SODIUM 6 MG PO TABS
6.0000 mg | ORAL_TABLET | Freq: Once | ORAL | Status: AC
  Administered 2018-02-13: 6 mg via ORAL
  Filled 2018-02-13: qty 1

## 2018-02-13 MED ORDER — FUROSEMIDE 20 MG PO TABS
40.0000 mg | ORAL_TABLET | Freq: Every day | ORAL | Status: DC
  Administered 2018-02-14 – 2018-02-16 (×3): 40 mg via ORAL
  Filled 2018-02-13 (×3): qty 2

## 2018-02-13 MED ORDER — HYDROCODONE-ACETAMINOPHEN 5-325 MG PO TABS
1.0000 | ORAL_TABLET | Freq: Every day | ORAL | Status: DC
  Administered 2018-02-13 – 2018-02-15 (×3): 1 via ORAL
  Filled 2018-02-13 (×3): qty 1

## 2018-02-13 MED ORDER — ALPRAZOLAM 0.5 MG PO TABS: 0.5000 mg | ORAL_TABLET | Freq: Two times a day (BID) | ORAL | Status: DC | PRN

## 2018-02-13 MED ORDER — LEVALBUTEROL HCL 0.63 MG/3ML IN NEBU: 0.6300 mg | INHALATION_SOLUTION | Freq: Four times a day (QID) | RESPIRATORY_TRACT | Status: DC | PRN

## 2018-02-13 MED ORDER — SUVOREXANT 10 MG PO TABS: 10.0000 mg | ORAL_TABLET | Freq: Every evening | ORAL | Status: DC | PRN

## 2018-02-13 NOTE — Progress Notes (Addendum)
ANTICOAGULATION CONSULT NOTE - Initial Consult  Pharmacy Consult for Coumadin and Heparin Indication: atrial fibrillation  Allergies  Allergen Reactions  . Adhesive [Tape] Other (See Comments)    Burning sensation  . Atorvastatin     Makes legs hurt   . Metformin And Related     "caused me kidney problems"   . Sulfonamide Derivatives Other (See Comments)    Unknown    Patient Measurements: Height: 5' 6.5" (168.9 cm) Weight: 213 lb (96.6 kg) IBW/kg (Calculated) : 60.45  Heparin dosing wt: 81 kg  Vital Signs: Temp: 99.5 F (37.5 C) (12/26 1716) Temp Source: Oral (12/26 1716) BP: 111/77 (12/26 2115) Pulse Rate: 91 (12/26 2115)  Labs: Recent Labs    02/13/18 1813  HGB 11.0*  HCT 37.2  PLT 214  LABPROT 17.4*  INR 1.44  CREATININE 1.39*  TROPONINI <0.03    Estimated Creatinine Clearance (by C-G formula based on SCr of 1.39 mg/dL (H)) Female: 38.8 mL/min (A) Female: 47.3 mL/min (A)   Medical History: Past Medical History:  Diagnosis Date  . Anemia   . Anxiety   . CAD (coronary artery disease)   . Chronic kidney disease    CKD stage 3   . COPD (chronic obstructive pulmonary disease) (HCC)    uses  1 to and1.5 L of o2 at bedtime ; has not seen her pulm doctor in over a year ; has not had to uses inhlaer in a over a year    . Diabetes mellitus    type II  . GERD (gastroesophageal reflux disease)   . HTN (hypertension)    essential  . Hyperlipidemia, mixed   . Mitral valve disorder   . Osteoarthritis   . Panic attack   . Paroxysmal atrial fibrillation (HCC)   . Pneumonia    2010  . Pulmonary embolism ([place]) 2010  . Sleep apnea    does not uses cpap since she got a URI from it. has home 02 as needed    Medications:  See electronic med rec  Assessment: 77 y.o. F presents with SOB. Pt on coumadin PTA for afib. CBC ok at baseline. INR subtherapeutic (1.44) on admission. MD also wants a heparin bridge until INR therapeutic. Home dose: 2.5mg  daily except  for 5mg  Sun and Thur. Last dose 12/25 2200.  Goal of Therapy:  INR 2-3, Heparin level 0.3-0.7 units/ml Monitor platelets by anticoagulation protocol: Yes   Plan:  Coumadin 6mg  po tonight  Heparin gtt at 1150 units/hr Will f/u 8 hr heparin level Daily INR, CBC, and heparin level  Sherlon Handing, PharmD, BCPS Clinical pharmacist  **Pharmacist phone directory can now be found on [email or web address] ([email or web address]).  Listed under [place]. 02/13/2018,9:21 PM

## 2018-02-13 NOTE — ED Triage Notes (Signed)
Pt presents to ED with shortness of breath, dx with bronchitis recently and hx COPD. Recent hospitalizations for such but increasing sob today. Wears 2L O2 at home as needed. Found by FD 86% on room air. Denies n/v, cp. Pt did neb tx at home with no relief, 10 albuterol, 1 atrovent, 125 solumedrol given PTA.

## 2018-02-13 NOTE — H&P (Signed)
History and Physical    Emily Abbott ZGY:[id number] DOB: 1941/01/16 DOA: 02/13/2018  PCP: Harlan Stains, MD  Patient coming from: Home  I have personally briefly reviewed patient's old medical records in [place]  Chief Complaint: SOB  HPI: Emily Abbott is a 77 y.o. adult with medical history significant of COPD 1-1.5L O2 QHS, cardiomyopathy s/p BIV ICD, PAF on coumadin, DM2, HTN, prior PE.  Patient presents to the ED with c/o SOB.  Has had several weeks of bronchitis.  This persists despite recent short course of steroids.  Seen by PCP on Monday who put her on ABx.  FD called today, pt 86% on RA.   ED Course: Symptoms persist despite neb treatments, solumedrol.   Review of Systems: As per HPI otherwise 10 point review of systems negative.   Past Medical History:  Diagnosis Date  . Anemia   . Anxiety   . CAD (coronary artery disease)   . Chronic kidney disease    CKD stage 3   . COPD (chronic obstructive pulmonary disease) (HCC)    uses  1 to and1.5 L of o2 at bedtime ; has not seen her pulm doctor in over a year ; has not had to uses inhlaer in a over a year    . Diabetes mellitus    type II  . GERD (gastroesophageal reflux disease)   . HTN (hypertension)    essential  . Hyperlipidemia, mixed   . Mitral valve disorder   . Osteoarthritis   . Panic attack   . Paroxysmal atrial fibrillation (HCC)   . Pneumonia    2010  . Pulmonary embolism ([place]) 2010  . Sleep apnea    does not uses cpap since she got a URI from it. has home 02 as needed    Past Surgical History:  Procedure Laterality Date  . BIOPSY BREAST    . BIV ICD GENERATOR CHANGEOUT N/A 06/06/2017   Procedure: BIV ICD GENERATOR CHANGEOUT;  Surgeon: Evans Lance, MD;  Location: [facility];  Service: Cardiovascular;  Laterality: N/A;  . CARDIAC VALVE SURGERY     mitral  . carpal tennel  2003  . EYE SURGERY  2015  or 2016   cataract surgery   . PARATHYROIDECTOMY Left 06/17/2017   Procedure: LEFT UPPER PARATHYROIDECTOMY;  Surgeon: Clovis Riley, MD;  Location: [facility];  Service: General;  Laterality: Left;  . TUBAL LIGATION      over 60 years ago      reports that he quit smoking about 16 years ago. His smoking use included cigarettes. He has a 50.00 pack-year smoking history. He has never used smokeless tobacco. He reports that he does not drink alcohol or use drugs.  Allergies  Allergen Reactions  . Adhesive [Tape] Other (See Comments)    Burning sensation  . Atorvastatin     Makes legs hurt   . Metformin And Related     "caused me kidney problems"   . Sulfonamide Derivatives Other (See Comments)    Unknown    Family History  Problem Relation Age of Onset  . Stroke Mother   . Anuerysm Father   . Diabetes Sister   . Cancer Brother   . Ulcers Sister      Prior to Admission medications   Medication Sig Start Date End Date Taking? Authorizing Provider  acetaminophen (TYLENOL) 325 MG tablet Take 2 tablets (650 mg total) by mouth every 6 (six) hours as needed for  mild pain or moderate pain. 06/18/17  Yes Clovis Riley, MD  albuterol [facility] HFA) 108 (90 Base) MCG/ACT inhaler Inhale 1-2 puffs into the lungs every 6 (six) hours as needed for wheezing or shortness of breath. 08/12/15  Yes Brand Males, MD  ALPRAZolam Duanne Moron) 0.5 MG tablet Take 0.5 mg by mouth 2 (two) times daily as needed for anxiety.    Yes [provider]  amLODipine (NORVASC) 5 MG tablet Take 5 mg by mouth daily.   Yes [provider]  amoxicillin (AMOXIL) 875 MG tablet Take 875 mg by mouth 2 (two) times daily.   Yes [provider]  atorvastatin (LIPITOR) 10 MG tablet Take 5 mg by mouth daily.    Yes [provider]  Azelastine HCl 137 MCG/SPRAY SOLN Place 2 sprays into both nostrils at bedtime.   Yes [provider]  carvedilol (COREG) 6.25 MG tablet Take 6.25 mg by mouth 2 (two) times daily with a meal.   Yes [provider]    diphenhydrAMINE (BENADRYL) 25 MG tablet Take 12.5 mg by mouth daily as needed for itching.   Yes [provider]  ezetimibe (ZETIA) 10 MG tablet Take 10 mg by mouth daily.   Yes [provider]  furosemide (LASIX) 20 MG tablet TAKE 2 TABLETS BY MOUTH EVERY DAY Patient taking differently: TAKE 40 MG BY MOUTH EVERY DAY 10/03/15  Yes Evans Lance, MD  glimepiride (AMARYL) 1 MG tablet Take 1 mg by mouth daily. 05/24/17  Yes [provider]  glipiZIDE (GLUCOTROL) 5 MG tablet Take 5 mg by mouth every morning. 12/02/17  Yes [provider]  HYDROcodone-acetaminophen (NORCO/VICODIN) 5-325 MG tablet Take 1 tablet by mouth at bedtime.    Yes [provider]  HYDROcodone-homatropine (HYCODAN) 5-1.5 MG/5ML syrup Take 5 mLs by mouth every 6 (six) hours as needed for cough.   Yes [provider]  linagliptin (TRADJENTA) 5 MG TABS tablet Take 5 mg by mouth daily.   Yes [provider]  magnesium gluconate (MAGONATE) 500 MG tablet Take 500 mg by mouth daily as needed (for leg cramps).    Yes [provider]  omeprazole (PRILOSEC) 40 MG capsule Take 40 mg by mouth daily.     Yes [provider]  OXYGEN Inhale 1.5 L into the lungs at bedtime.    Yes [provider]  PARoxetine (PAXIL) 30 MG tablet Take 30 mg by mouth daily.     Yes [provider]  Suvorexant (BELSOMRA) 10 MG TABS Take 10 mg by mouth at bedtime as needed (for sleep).   Yes [provider]  tiZANidine (ZANAFLEX) 2 MG tablet Take 2 mg by mouth at bedtime as needed for muscle spasms.    Yes [provider]  warfarin (COUMADIN) 5 MG tablet Take 2.5-5 mg by mouth See admin instructions. Take 5 mg by mouth daily on Sunday and Thursday. Take 2.5 mg by mouth daily on all other days   Yes [provider]  TRELEGY ELLIPTA 100-62.5-25 MCG/INH AEPB Inhale 1 puff into the lungs daily. 11/27/17   [provider]    Physical  Exam: Vitals:   02/13/18 2000 02/13/18 2045 02/13/18 2100 02/13/18 2115  BP: 117/73 (!) 116/56 133/69 111/77  Pulse: 82 91 92 91  Resp: (!) 21 (!) 21 (!) 23 20  Temp:      TempSrc:      SpO2: 100% 94% 98% 98%  Weight:      Height:  Constitutional: NAD, calm, comfortable Eyes: PERRL, lids and conjunctivae normal ENMT: Mucous membranes are moist. Posterior pharynx clear of any exudate or lesions.Normal dentition.  Neck: normal, supple, no masses, no thyromegaly Respiratory: Diffuse wheezing Cardiovascular: Regular rate and rhythm, no murmurs / rubs / gallops. No extremity edema. 2+ pedal pulses. No carotid bruits.  Abdomen: no tenderness, no masses palpated. No hepatosplenomegaly. Bowel sounds positive.  Musculoskeletal: no clubbing / cyanosis. No joint deformity upper and lower extremities. Good ROM, no contractures. Normal muscle tone.  Skin: no rashes, lesions, ulcers. No induration Neurologic: CN 2-12 grossly intact. Sensation intact, DTR normal. Strength 5/5 in all 4.  Psychiatric: Normal judgment and insight. Alert and oriented x 3. Normal mood.    Labs on Admission: I have personally reviewed following labs and imaging studies  CBC: Recent Labs  Lab 02/13/18 1813  WBC 10.2  NEUTROABS 7.6  HGB 11.0*  HCT 37.2  MCV 82.3  PLT 379   Basic Metabolic Panel: Recent Labs  Lab 02/13/18 1813  NA 136  K 3.8  CL 102  CO2 21*  GLUCOSE 227*  BUN 16  CREATININE 1.39*  CALCIUM 9.2   GFR: Estimated Creatinine Clearance (by C-G formula based on SCr of 1.39 mg/dL (H)) Female: 38.8 mL/min (A) Female: 47.3 mL/min (A) Liver Function Tests: Recent Labs  Lab 02/13/18 1813  AST 20  ALT 16  ALKPHOS 55  BILITOT 0.6  PROT 7.1  ALBUMIN 3.7   No results for input(s): LIPASE, AMYLASE in the last 168 hours. No results for input(s): AMMONIA in the last 168 hours. Coagulation Profile: Recent Labs  Lab 02/13/18 1813  INR 1.44   Cardiac Enzymes: Recent Labs  Lab  02/13/18 1813  TROPONINI <0.03   BNP (last 3 results) No results for input(s): PROBNP in the last 8760 hours. HbA1C: No results for input(s): HGBA1C in the last 72 hours. CBG: No results for input(s): GLUCAP in the last 168 hours. Lipid Profile: No results for input(s): CHOL, HDL, LDLCALC, TRIG, CHOLHDL, LDLDIRECT in the last 72 hours. Thyroid Function Tests: No results for input(s): TSH, T4TOTAL, FREET4, T3FREE, THYROIDAB in the last 72 hours. Anemia Panel: No results for input(s): VITAMINB12, FOLATE, FERRITIN, TIBC, IRON, RETICCTPCT in the last 72 hours. Urine analysis:    Component Value Date/Time   COLORURINE YELLOW 02/13/2018 2014   APPEARANCEUR CLEAR 02/13/2018 2014   LABSPEC 1.013 02/13/2018 2014   PHURINE 5.0 02/13/2018 2014   GLUCOSEU NEGATIVE 02/13/2018 2014   HGBUR SMALL (A) 02/13/2018 2014   [place] NEGATIVE 02/13/2018 2014   [place] NEGATIVE 02/13/2018 2014   PROTEINUR NEGATIVE 02/13/2018 2014   UROBILINOGEN 0.2 12/19/2007 0144   NITRITE NEGATIVE 02/13/2018 2014   LEUKOCYTESUR NEGATIVE 02/13/2018 2014    Radiological Exams on Admission: Dg Chest Port 1 View  Result Date: 02/13/2018 CLINICAL DATA:  Dyspnea EXAM: PORTABLE CHEST 1 VIEW COMPARISON:  None. FINDINGS: Normal heart size. Mild aortic atherosclerosis without aneurysm. Median sternotomy sutures are in place with evidence of prior mitral valvular repair. Left-sided ICD device with leads in the right atrium, coronary sinus and right ventricle are noted. Minimal atelectasis is seen at the left lung base. The lungs otherwise appear clear. No effusion or pneumothorax. Osteoarthritis about the included right AC and glenohumeral joints. IMPRESSION: No active disease. Electronically Signed   By: [name] Royalty M.D.   On: 02/13/2018 18:40    EKG: Independently reviewed.  Assessment/Plan Principal Problem:   COPD exacerbation (HCC) Active Problems:   DM2 (diabetes mellitus,  type 2) ([place])   Essential  hypertension   PAROXYSMAL ATRIAL FIBRILLATION   Chronic systolic heart failure (HCC)   Biventricular implantable cardioverter-defibrillator in situ   History of pulmonary embolism   Acute on chronic respiratory failure with hypoxia ([place])    1. COPD exacerbation - 1. COPD pathway 2. PRN xopenex 3. Scheduled trielegy elipta 4. Prednisone 5. Rocephin 6. Cont pulse ox 7. Check influenza PCR 2. PAF - 1. Tele monitor for 48h with beta agonist tx 2. Cont rate control with coreg 3. Cont coumadin, subtheraputic INR 1.4 so 4. Heparin bridge 3. Chronic systolic CHF - 1. Cont lasix 4. HTN - 1. Cont norvasc and coreg 5. DM2 - 1. Cont tradjenda 2. Hold sulfonylurea's (shes on 2 different ones it looks like) 3. Mod scale SSI AC 4. Watch for need for further increase in treatment with steroids  DVT prophylaxis: Heparin bridge to coumadin Code Status: Full Family Communication: No family ni room Disposition Plan: Home after admit Consults called: None Admission status: Place in obs, convert to IP if not discharged tomorrow     [place], [place] [facility] Pager [phone] Only works nights!  If 7AM-7PM, please contact the primary day team physician taking care of patient  [email or web address] Password [facility]  02/13/2018, 9:28 PM

## 2018-02-13 NOTE — ED Provider Notes (Signed)
[facility] EMERGENCY DEPARTMENT Provider Note   CSN: [id number] Arrival date & time: 02/13/18  1708     History   Chief Complaint Chief Complaint  Patient presents with  . Shortness of Breath    HPI Emily Abbott is a 77 y.o. adult.  HPI Patient is had several weeks of bronchitis.  She has had recent short course of steroids.  States she was seen by her primary physician on Monday.  Prescribed antibiotics.  She has ongoing shortness of breath especially with minimal exertion.  She has cough but no sputum production.  States she wears 1 to 1-1/2 L of oxygen at home.  Patient received albuterol and Atrovent in route as well as 125 mg of Solu-Medrol. Past Medical History:  Diagnosis Date  . Anemia   . Anxiety   . CAD (coronary artery disease)   . Chronic kidney disease    CKD stage 3   . COPD (chronic obstructive pulmonary disease) (HCC)    uses  1 to and1.5 L of o2 at bedtime ; has not seen her pulm doctor in over a year ; has not had to uses inhlaer in a over a year    . Diabetes mellitus    type II  . GERD (gastroesophageal reflux disease)   . HTN (hypertension)    essential  . Hyperlipidemia, mixed   . Mitral valve disorder   . Osteoarthritis   . Panic attack   . Paroxysmal atrial fibrillation (HCC)   . Pneumonia    2010  . Pulmonary embolism ([place]) 2010  . Sleep apnea    does not uses cpap since she got a URI from it. has home 02 as needed    Patient Active Problem List   Diagnosis Date Noted  . Hyperparathyroidism ([place]) 06/17/2017  . Obstructive sleep apnea 02/22/2015  . Lung nodule, solitary 11/24/2014  . Dyspnea and respiratory abnormality 11/10/2014  . Hoarseness of voice 11/10/2014  . Smoking history 11/10/2014  . History of pulmonary embolism 11/10/2014  . Biventricular implantable cardioverter-defibrillator in situ 07/10/2011  . ORTHOSTATIC DIZZINESS 10/10/2009  . Chronic systolic heart failure ([place]) 11/16/2008  . DM2 (diabetes  mellitus, type 2) ([place]) 11/12/2008  . HYPERLIPIDEMIA, MIXED 11/12/2008  . ANEMIA 11/12/2008  . ANXIETY 11/12/2008  . PANIC ATTACK 11/12/2008  . Essential hypertension 11/12/2008  . Coronary atherosclerosis 11/12/2008  . MITRAL VALVE DISORDER 11/12/2008  . PAROXYSMAL ATRIAL FIBRILLATION 11/12/2008  . COPD (chronic obstructive pulmonary disease) ([place]) 11/12/2008  . GERD 11/12/2008  . OSTEOARTHRITIS 11/12/2008  . PULMONARY EMBOLISM, HX OF 11/12/2008    Past Surgical History:  Procedure Laterality Date  . BIOPSY BREAST    . BIV ICD GENERATOR CHANGEOUT N/A 06/06/2017   Procedure: BIV ICD GENERATOR CHANGEOUT;  Surgeon: Evans Lance, MD;  Location: [facility];  Service: Cardiovascular;  Laterality: N/A;  . CARDIAC VALVE SURGERY     mitral  . carpal tennel  2003  . EYE SURGERY  2015  or 2016   cataract surgery   . PARATHYROIDECTOMY Left 06/17/2017   Procedure: LEFT UPPER PARATHYROIDECTOMY;  Surgeon: Clovis Riley, MD;  Location: [facility];  Service: General;  Laterality: Left;  . TUBAL LIGATION      over 60 years ago      OB History   No obstetric history on file.      Home Medications    Prior to Admission medications   Medication Sig Start Date End Date Taking?  Authorizing Provider  acetaminophen (TYLENOL) 325 MG tablet Take 2 tablets (650 mg total) by mouth every 6 (six) hours as needed for mild pain or moderate pain. 06/18/17  Yes Clovis Riley, MD  albuterol [facility] HFA) 108 (90 Base) MCG/ACT inhaler Inhale 1-2 puffs into the lungs every 6 (six) hours as needed for wheezing or shortness of breath. 08/12/15  Yes Brand Males, MD  ALPRAZolam Duanne Moron) 0.5 MG tablet Take 0.5 mg by mouth 2 (two) times daily as needed for anxiety.    Yes [provider]  amLODipine (NORVASC) 5 MG tablet Take 5 mg by mouth daily.   Yes [provider]  amoxicillin (AMOXIL) 875 MG tablet Take 875 mg by mouth 2 (two) times daily.   Yes [provider]    atorvastatin (LIPITOR) 10 MG tablet Take 5 mg by mouth daily.    Yes [provider]  Azelastine HCl 137 MCG/SPRAY SOLN Place 2 sprays into both nostrils at bedtime.   Yes [provider]  carvedilol (COREG) 6.25 MG tablet Take 6.25 mg by mouth 2 (two) times daily with a meal.   Yes [provider]  diphenhydrAMINE (BENADRYL) 25 MG tablet Take 12.5 mg by mouth daily as needed for itching.   Yes [provider]  ezetimibe (ZETIA) 10 MG tablet Take 10 mg by mouth daily.   Yes [provider]  furosemide (LASIX) 20 MG tablet TAKE 2 TABLETS BY MOUTH EVERY DAY Patient taking differently: TAKE 40 MG BY MOUTH EVERY DAY 10/03/15  Yes Evans Lance, MD  glimepiride (AMARYL) 1 MG tablet Take 1 mg by mouth daily. 05/24/17  Yes [provider]  glipiZIDE (GLUCOTROL) 5 MG tablet Take 5 mg by mouth every morning. 12/02/17  Yes [provider]  HYDROcodone-acetaminophen (NORCO/VICODIN) 5-325 MG tablet Take 1 tablet by mouth at bedtime.    Yes [provider]  HYDROcodone-homatropine (HYCODAN) 5-1.5 MG/5ML syrup Take 5 mLs by mouth every 6 (six) hours as needed for cough.   Yes [provider]  linagliptin (TRADJENTA) 5 MG TABS tablet Take 5 mg by mouth daily.   Yes [provider]  magnesium gluconate (MAGONATE) 500 MG tablet Take 500 mg by mouth daily as needed (for leg cramps).    Yes [provider]  omeprazole (PRILOSEC) 40 MG capsule Take 40 mg by mouth daily.     Yes [provider]  OXYGEN Inhale 1.5 L into the lungs at bedtime.    Yes [provider]  PARoxetine (PAXIL) 30 MG tablet Take 30 mg by mouth daily.     Yes [provider]  Suvorexant (BELSOMRA) 10 MG TABS Take 10 mg by mouth at bedtime as needed (for sleep).   Yes [provider]  tiZANidine (ZANAFLEX) 2 MG tablet Take 2 mg by mouth at bedtime as needed for muscle spasms.    Yes [provider]   warfarin (COUMADIN) 5 MG tablet Take 2.5-5 mg by mouth See admin instructions. Take 5 mg by mouth daily on Sunday and Thursday. Take 2.5 mg by mouth daily on all other days   Yes [provider]  TRELEGY ELLIPTA 100-62.5-25 MCG/INH AEPB Inhale 1 puff into the lungs daily. 11/27/17   [provider]    Family History Family History  Problem Relation Age of Onset  . Stroke Mother   . Anuerysm Father   . Diabetes Sister   . Cancer Brother   . Ulcers Sister  Social History Social History   Tobacco Use  . Smoking status: Former Smoker    Packs/day: 1.00    Years: 50.00    Pack years: 50.00    Types: Cigarettes    Last attempt to quit: 02/19/2001    Years since quitting: 16.9  . Smokeless tobacco: Never Used  Substance Use Topics  . Alcohol use: No    Alcohol/week: 0.0 standard drinks  . Drug use: Never     Allergies   Adhesive [tape]; Atorvastatin; Metformin and related; and Sulfonamide derivatives   Review of Systems Review of Systems  Constitutional: Negative for chills and fever.  HENT: Positive for congestion. Negative for sinus pressure, sinus pain and sore throat.   Respiratory: Positive for cough, shortness of breath and wheezing.   Cardiovascular: Negative for chest pain, palpitations and leg swelling.  Gastrointestinal: Negative for abdominal pain, constipation, diarrhea, nausea and vomiting.  Genitourinary: Negative for flank pain and frequency.  Musculoskeletal: Negative for back pain, myalgias and neck pain.  Skin: Negative for pallor, rash and wound.  Neurological: Negative for dizziness, weakness, light-headedness, numbness and headaches.  All other systems reviewed and are negative.    Physical Exam Updated Vital Signs BP 111/77   Pulse 91   Temp 99.5 F (37.5 C) (Oral)   Resp 20   Ht 5' 6.5" (1.689 m)   Wt 96.6 kg   SpO2 98%   BMI 33.86 kg/m   Physical Exam Vitals signs and nursing note reviewed.  Constitutional:       General: He is not in acute distress.    Appearance: Normal appearance. He is well-developed. He is not ill-appearing.     Comments: Chronically sick appearing  HENT:     Head: Normocephalic and atraumatic.     Nose: Nose normal.     Mouth/Throat:     Mouth: Mucous membranes are moist.     Pharynx: No posterior oropharyngeal erythema.  Eyes:     Extraocular Movements: Extraocular movements intact.     Conjunctiva/sclera: Conjunctivae normal.     Pupils: Pupils are equal, round, and reactive to light.  Neck:     Musculoskeletal: Normal range of motion and neck supple. No neck rigidity or muscular tenderness.  Cardiovascular:     Rate and Rhythm: Normal rate and regular rhythm.     Pulses: Normal pulses.  Pulmonary:     Effort: Pulmonary effort is normal.     Breath sounds: Wheezing present.     Comments: Expiratory wheezing throughout. Abdominal:     General: Bowel sounds are normal.     Palpations: Abdomen is soft.     Tenderness: There is no abdominal tenderness. There is no guarding or rebound.  Musculoskeletal: Normal range of motion.        General: No swelling, tenderness, deformity or signs of injury.     Right lower leg: No edema.     Left lower leg: No edema.  Lymphadenopathy:     Cervical: No cervical adenopathy.  Skin:    General: Skin is warm and dry.     Findings: No erythema or rash.  Neurological:     General: No focal deficit present.     Mental Status: He is alert and oriented to person, place, and time.  Psychiatric:        Mood and Affect: Mood normal.        Behavior: Behavior normal.      ED Treatments / Results  Labs (all labs ordered  are listed, but only abnormal results are displayed) Labs Reviewed  CBC WITH DIFFERENTIAL/PLATELET - Abnormal; Notable for the following components:      Result Value   Hemoglobin 11.0 (*)    MCH 24.3 (*)    MCHC 29.6 (*)    RDW 16.0 (*)    All other components within normal limits  BRAIN NATRIURETIC PEPTIDE -  Abnormal; Notable for the following components:   B Natriuretic Peptide 237.1 (*)    All other components within normal limits  COMPREHENSIVE METABOLIC PANEL - Abnormal; Notable for the following components:   CO2 21 (*)    Glucose, Bld 227 (*)    Creatinine, Ser 1.39 (*)    GFR calc non Af Amer 36 (*)    GFR calc Af Amer 42 (*)    All other components within normal limits  URINALYSIS, ROUTINE W REFLEX MICROSCOPIC - Abnormal; Notable for the following components:   Hgb urine dipstick SMALL (*)    Bacteria, UA RARE (*)    All other components within normal limits  PROTIME-INR - Abnormal; Notable for the following components:   Prothrombin Time 17.4 (*)    All other components within normal limits  TROPONIN I  INFLUENZA PANEL BY PCR (TYPE A & B)    EKG EKG Interpretation  Date/Time:  Thursday February 13 2018 17:18:38 EST Ventricular Rate:  82 PR Interval:    QRS Duration: 145 QT Interval:  423 QTC Calculation: 491 R Axis:   112 Text Interpretation:  Atrial fibrillation Nonspecific intraventricular conduction delay Lateral infarct, age indeterminate Confirmed by Julianne Rice [phone]) on 02/13/2018 5:56:03 PM   Radiology Dg Chest Port 1 View  Result Date: 02/13/2018 CLINICAL DATA:  Dyspnea EXAM: PORTABLE CHEST 1 VIEW COMPARISON:  None. FINDINGS: Normal heart size. Mild aortic atherosclerosis without aneurysm. Median sternotomy sutures are in place with evidence of prior mitral valvular repair. Left-sided ICD device with leads in the right atrium, coronary sinus and right ventricle are noted. Minimal atelectasis is seen at the left lung base. The lungs otherwise appear clear. No effusion or pneumothorax. Osteoarthritis about the included right AC and glenohumeral joints. IMPRESSION: No active disease. Electronically Signed   By: [name] Royalty M.D.   On: 02/13/2018 18:40    Procedures Procedures (including critical care time)  Medications Ordered in ED Medications  albuterol  (PROVENTIL,VENTOLIN) solution continuous neb (10 mg/hr Nebulization New Bag/Given 02/13/18 1928)  insulin aspart (novoLOG) injection 0-15 Units (has no administration in time range)  Fluticasone-Umeclidin-Vilant 100-62.5-25 MCG/INH AEPB 1 puff (has no administration in time range)  Suvorexant TABS 10 mg (has no administration in time range)  tiZANidine (ZANAFLEX) tablet 2 mg (has no administration in time range)  PARoxetine (PAXIL) tablet 30 mg (has no administration in time range)  pantoprazole (PROTONIX) EC tablet 40 mg (has no administration in time range)  linagliptin (TRADJENTA) tablet 5 mg (has no administration in time range)  carvedilol (COREG) tablet 6.25 mg (has no administration in time range)  ezetimibe (ZETIA) tablet 10 mg (has no administration in time range)  furosemide (LASIX) tablet 40 mg (has no administration in time range)  HYDROcodone-acetaminophen (NORCO/VICODIN) 5-325 MG per tablet 1 tablet (has no administration in time range)  HYDROcodone-homatropine (HYCODAN) 5-1.5 MG/5ML syrup 5 mL (has no administration in time range)  atorvastatin (LIPITOR) tablet 5 mg (has no administration in time range)  amLODipine (NORVASC) tablet 5 mg (has no administration in time range)  ALPRAZolam (XANAX) tablet 0.5 mg (has no administration in  time range)  acetaminophen (TYLENOL) tablet 650 mg (has no administration in time range)  diltiazem (CARDIZEM) injection 10 mg (10 mg Intravenous Given 02/13/18 2115)     Initial Impression / Assessment and Plan / ED Course  I have reviewed the triage vital signs and the nursing notes.  Pertinent labs & imaging results that were available during my care of the patient were reviewed by me and considered in my medical decision making (see chart for details).     With persistent wheezing and dyspnea with minimal exertion despite continuous nebulized treatment.  No evidence of pneumonia on x-ray.  Discussed with hospitalist who will admit for COPD  exacerbation.  Final Clinical Impressions(s) / ED Diagnoses   Final diagnoses:  COPD exacerbation [facility])    ED Discharge Orders    None       Julianne Rice, MD 02/13/18 2120

## 2018-02-14 DIAGNOSIS — Z95810 Presence of automatic (implantable) cardiac defibrillator: Secondary | ICD-10-CM | POA: Diagnosis not present

## 2018-02-14 DIAGNOSIS — J441 Chronic obstructive pulmonary disease with (acute) exacerbation: Secondary | ICD-10-CM | POA: Diagnosis not present

## 2018-02-14 DIAGNOSIS — I5022 Chronic systolic (congestive) heart failure: Secondary | ICD-10-CM | POA: Diagnosis not present

## 2018-02-14 DIAGNOSIS — E119 Type 2 diabetes mellitus without complications: Secondary | ICD-10-CM | POA: Diagnosis not present

## 2018-02-14 DIAGNOSIS — I10 Essential (primary) hypertension: Secondary | ICD-10-CM | POA: Diagnosis not present

## 2018-02-14 DIAGNOSIS — I480 Paroxysmal atrial fibrillation: Secondary | ICD-10-CM | POA: Diagnosis not present

## 2018-02-14 DIAGNOSIS — J9621 Acute and chronic respiratory failure with hypoxia: Secondary | ICD-10-CM | POA: Diagnosis not present

## 2018-02-14 LAB — HEPARIN LEVEL (UNFRACTIONATED)
Heparin Unfractionated: 0.36 IU/mL (ref 0.30–0.70)
Heparin Unfractionated: 0.36 IU/mL (ref 0.30–0.70)

## 2018-02-14 LAB — CBC
HCT: 33.3 % — ABNORMAL LOW (ref 36.0–46.0)
Hemoglobin: 10.6 g/dL — ABNORMAL LOW (ref 12.0–15.0)
MCH: 25.7 pg — ABNORMAL LOW (ref 26.0–34.0)
MCHC: 31.8 g/dL (ref 30.0–36.0)
MCV: 80.6 fL (ref 80.0–100.0)
PLATELETS: 236 10*3/uL (ref 150–400)
RBC: 4.13 MIL/uL (ref 3.87–5.11)
RDW: 16 % — AB (ref 11.5–15.5)
WBC: 10.9 10*3/uL — ABNORMAL HIGH (ref 4.0–10.5)
nRBC: 0 % (ref 0.0–0.2)

## 2018-02-14 LAB — PROTIME-INR
INR: 1.57
PROTHROMBIN TIME: 18.6 s — AB (ref 11.4–15.2)

## 2018-02-14 LAB — HIV ANTIBODY (ROUTINE TESTING W REFLEX): HIV SCREEN 4TH GENERATION: NONREACTIVE

## 2018-02-14 LAB — GLUCOSE, CAPILLARY
GLUCOSE-CAPILLARY: 373 mg/dL — AB (ref 70–99)
Glucose-Capillary: 299 mg/dL — ABNORMAL HIGH (ref 70–99)
Glucose-Capillary: 323 mg/dL — ABNORMAL HIGH (ref 70–99)
Glucose-Capillary: 220 mg/dL — ABNORMAL HIGH (ref 70–99)
Glucose-Capillary: 282 mg/dL — ABNORMAL HIGH (ref 70–99)

## 2018-02-14 MED ORDER — WARFARIN SODIUM 3 MG PO TABS
6.0000 mg | ORAL_TABLET | Freq: Once | ORAL | Status: AC
  Administered 2018-02-14: 6 mg via ORAL
  Filled 2018-02-14: qty 2

## 2018-02-14 MED ORDER — IPRATROPIUM-ALBUTEROL 0.5-2.5 (3) MG/3ML IN SOLN
3.0000 mL | Freq: Four times a day (QID) | RESPIRATORY_TRACT | Status: DC
  Administered 2018-02-14 (×2): 3 mL via RESPIRATORY_TRACT
  Filled 2018-02-14 (×2): qty 3

## 2018-02-14 MED ORDER — INSULIN GLARGINE 100 UNIT/ML ~~LOC~~ SOLN
10.0000 [IU] | Freq: Every day | SUBCUTANEOUS | Status: DC
  Administered 2018-02-14 – 2018-02-16 (×3): 10 [IU] via SUBCUTANEOUS
  Filled 2018-02-14 (×3): qty 0.1

## 2018-02-14 MED ORDER — POLYETHYLENE GLYCOL 3350 17 G PO PACK
17.0000 g | PACK | Freq: Every day | ORAL | Status: DC
  Administered 2018-02-14 – 2018-02-16 (×3): 17 g via ORAL
  Filled 2018-02-14 (×3): qty 1

## 2018-02-14 MED ORDER — BUDESONIDE 0.25 MG/2ML IN SUSP
0.2500 mg | Freq: Two times a day (BID) | RESPIRATORY_TRACT | Status: DC
  Administered 2018-02-14 – 2018-02-16 (×4): 0.25 mg via RESPIRATORY_TRACT
  Filled 2018-02-14 (×4): qty 2

## 2018-02-14 NOTE — Progress Notes (Signed)
Nutrition Consult/Brief Note  RD consulted via Inpatient COPD Exacerbation protocol.  Wt Readings from Last 15 Encounters:  02/13/18 94.3 kg  09/17/17 93 kg  06/17/17 93 kg  06/14/17 93 kg  06/06/17 93 kg  05/31/17 93.9 kg  05/17/17 93 kg  04/03/17 92.5 kg  11/09/16 92.2 kg  08/20/16 90.7 kg  07/12/15 92 kg  05/25/15 93.5 kg  02/22/15 96.1 kg  02/02/15 96.2 kg  01/24/15 96.5 kg   Body mass index is 33.07 kg/m. Patient meets criteria for Obesity Class I based on current BMI.   Current diet order is Heart Healthy/Carbohydrate Modified. Labs and medications reviewed.   No nutrition interventions warranted at this time. If nutrition issues arise, please consult RD.   Arthur Holms, RD, LDN Pager #: [phone] After-Hours Pager #: [phone]

## 2018-02-14 NOTE — Progress Notes (Addendum)
Inpatient Diabetes Program Recommendations  AACE/ADA: New Consensus Statement on Inpatient Glycemic Control (2015)  Target Ranges:  Prepandial:   less than 140 mg/dL      Peak postprandial:   less than 180 mg/dL (1-2 hours)      Critically ill patients:  140 - 180 mg/dL   Lab Results  Component Value Date   GLUCAP 323 (H) 02/14/2018   HGBA1C 7.0 (H) 06/14/2017    Results for JENIN, BIRDSALL (MRN [id number]) as of 02/14/2018 12:47  Ref. Range 02/13/2018 22:36 02/14/2018 00:38 02/14/2018 07:24 02/14/2018 11:49  Glucose-Capillary Latest Ref Range: 70 - 99 mg/dL 463 (H) 373 (H) 299 (H) 323 (H)   DM2   Home DM meds: Glimepiride 1 mg daily OR Glipizide 5 mg daily (asked patient today and she was not sure which it was she takes)                              Tradjenta 5 mg daily                                   Current DM meds: Tradjenta 5 mg daily                                 Novolog moderate (0-15 units) tid ac                                   Prednisone 40 mg daily    CBGs continue to be elevated.     MD please consider the following inpatient diabetes recommendations:  Add Novolog 3 units meal coverage tid ac (Hold if NPO or if patient eats less than 50% of meal.)  Add Lantus 14 units daily (0.15 units/ kg)   As steroids are reduced likely patient's need for insulin will also decrease.   Update - spoke with patient and she stated she checks her CBG at home every am. She writes down the value on her calendar. I encouraged her to take this with her to her follow up with her PCP (Dr. Dema Severin) in January as oral diabetes medications may need to be adjusted if steroids are decreased/discontinued. She verbalized understanding.     Thank you.  -- Will follow during hospitalization.--  Jonna Clark RN, MSN Diabetes Coordinator Inpatient Glycemic Control Team Team Pager: [phone] (8am-5pm)

## 2018-02-14 NOTE — Progress Notes (Signed)
PROGRESS NOTE  Emily Abbott SJG:[id number] DOB: [birth year] DOA: 02/13/2018 PCP: Harlan Stains, MD   LOS: 0 days   Brief Narrative / Interim history: 77 year old female with history of COPD on baseline 1.5 L oxygen at nighttime, history of cardiomyopathy with biventricular pacer/ICD, PAF on Coumadin, type 2 diabetes mellitus, hypertension, prior PE who presents to the hospital with shortness of breath, diagnosed with COPD exacerbation.  Subjective: Continues to feel short of breath, has not done much due to significant dyspnea.  Barely able to ambulate to the bathroom.  Remains on oxygen this morning  Assessment & Plan: Principal Problem:   COPD exacerbation (HCC) Active Problems:   DM2 (diabetes mellitus, type 2) ([place])   Essential hypertension   PAROXYSMAL ATRIAL FIBRILLATION   Chronic systolic heart failure (HCC)   Biventricular implantable cardioverter-defibrillator in situ   History of pulmonary embolism   Acute on chronic respiratory failure with hypoxia (HCC)   Principal Problem Acute on chronic hypoxic respiratory failure due to COPD exacerbation -Patient was placed on antibiotics, steroids, nebulizers, continue, continue to wean off oxygen, at home she is on room air during daytime but she remains oxygen dependent today with significant dyspnea with minimal ambulation.  May need 24-hour oxygen on discharge but will determine when she is improving -Flu negative  Additional Problems Paroxysmal A. fib -Continue anticoagulation with Coumadin, bridging with heparin, on Coreg  Chronic systolic CHF -Most recently her EF has recovered, she appears euvolemic, continue home medications including Lasix  Type 2 diabetes mellitus -Continue sliding scale  Hypertension -Continue Norvasc and Coreg  Chronic kidney disease stage III -Baseline creatinine 1.1-1.3, currently at baseline   Scheduled Meds: . amLODipine  5 mg Oral Daily  . atorvastatin  5 mg Oral q1800  .  carvedilol  6.25 mg Oral BID WC  . ezetimibe  10 mg Oral Daily  . umeclidinium bromide  1 puff Inhalation Daily   And  . fluticasone furoate-vilanterol  1 puff Inhalation Daily  . furosemide  40 mg Oral Daily  . HYDROcodone-acetaminophen  1 tablet Oral QHS  . insulin aspart  0-15 Units Subcutaneous TID WC  . linagliptin  5 mg Oral Daily  . pantoprazole  40 mg Oral Daily  . PARoxetine  30 mg Oral Daily  . polyethylene glycol  17 g Oral Daily  . predniSONE  40 mg Oral Q breakfast  . warfarin  6 mg Oral ONCE-1800  . Warfarin - Pharmacist Dosing Inpatient   Does not apply q1800   Continuous Infusions: . albuterol 10 mg/hr (02/13/18 1928)  . cefTRIAXone (ROCEPHIN)  IV Stopped (02/14/18 0700)  . heparin 1,150 Units/hr (02/13/18 2325)   PRN Meds:.acetaminophen, ALPRAZolam, HYDROcodone-homatropine, levalbuterol, tiZANidine  DVT prophylaxis: coumadin Code Status: Full code Family Communication: no family at bedside  Disposition Plan: home when ready   Consultants:   None   Procedures:   None   Antimicrobials:  Ceftriaxone 12/26 >>   Objective: Vitals:   02/13/18 2220 02/13/18 2328 02/14/18 0723 02/14/18 0931  BP: (!) 164/80 (!) 164/86 (!) 150/85   Pulse: 84 85 72   Resp: (!) 24  19   Temp: 98.1 F (36.7 C)  98 F (36.7 C)   TempSrc: Oral  Oral   SpO2: 98%  99% 98%  Weight: 94.3 kg     Height: 5' 6.5" (1.689 m)       Intake/Output Summary (Last 24 hours) at 02/14/2018 1150 Last data filed at 02/14/2018 0441 Gross per 24 hour  Intake 640.28 ml  Output -  Net 640.28 ml   Filed Weights   02/13/18 1715 02/13/18 2220  Weight: 96.6 kg 94.3 kg    Examination:  Constitutional: NAD Eyes: PERRL, lids and conjunctivae normal ENMT: Mucous membranes are moist.  Respiratory: Scant end expiratory wheezing, no crackles, tachypneic Cardiovascular: Regular rate and rhythm, no murmurs / rubs / gallops. No LE edema. 2+ pedal pulses.  Abdomen: no tenderness. Bowel sounds  positive.  Musculoskeletal: no clubbing / cyanosis.  Skin: no rashes Neurologic: CN 2-12 grossly intact. Strength 5/5 in all 4.  Psychiatric: Normal judgment and insight. Alert and oriented x 3. Normal mood.    Data Reviewed: I have independently reviewed following labs and imaging studies   CBC: Recent Labs  Lab 02/13/18 1813 02/14/18 0908  WBC 10.2 10.9*  NEUTROABS 7.6  --   HGB 11.0* 10.6*  HCT 37.2 33.3*  MCV 82.3 80.6  PLT 214 332   Basic Metabolic Panel: Recent Labs  Lab 02/13/18 1813  NA 136  K 3.8  CL 102  CO2 21*  GLUCOSE 227*  BUN 16  CREATININE 1.39*  CALCIUM 9.2   GFR: Estimated Creatinine Clearance (by C-G formula based on SCr of 1.39 mg/dL (H)) Female: 38.3 mL/min (A) Female: 46.7 mL/min (A) Liver Function Tests: Recent Labs  Lab 02/13/18 1813  AST 20  ALT 16  ALKPHOS 55  BILITOT 0.6  PROT 7.1  ALBUMIN 3.7   No results for input(s): LIPASE, AMYLASE in the last 168 hours. No results for input(s): AMMONIA in the last 168 hours. Coagulation Profile: Recent Labs  Lab 02/13/18 1813 02/14/18 0908  INR 1.44 1.57   Cardiac Enzymes: Recent Labs  Lab 02/13/18 1813  TROPONINI <0.03   BNP (last 3 results) No results for input(s): PROBNP in the last 8760 hours. HbA1C: No results for input(s): HGBA1C in the last 72 hours. CBG: Recent Labs  Lab 02/13/18 2236 02/14/18 0038 02/14/18 0724  GLUCAP 463* 373* 299*   Lipid Profile: No results for input(s): CHOL, HDL, LDLCALC, TRIG, CHOLHDL, LDLDIRECT in the last 72 hours. Thyroid Function Tests: No results for input(s): TSH, T4TOTAL, FREET4, T3FREE, THYROIDAB in the last 72 hours. Anemia Panel: No results for input(s): VITAMINB12, FOLATE, FERRITIN, TIBC, IRON, RETICCTPCT in the last 72 hours. Urine analysis:    Component Value Date/Time   COLORURINE YELLOW 02/13/2018 2014   APPEARANCEUR CLEAR 02/13/2018 2014   LABSPEC 1.013 02/13/2018 2014   PHURINE 5.0 02/13/2018 2014   GLUCOSEU NEGATIVE  02/13/2018 2014   HGBUR SMALL (A) 02/13/2018 2014   [place] NEGATIVE 02/13/2018 2014   [place] NEGATIVE 02/13/2018 2014   PROTEINUR NEGATIVE 02/13/2018 2014   UROBILINOGEN 0.2 12/19/2007 0144   NITRITE NEGATIVE 02/13/2018 2014   LEUKOCYTESUR NEGATIVE 02/13/2018 2014   Sepsis Labs: Invalid input(s): PROCALCITONIN, LACTICIDVEN  No results found for this or any previous visit (from the past 240 hour(s)).    Radiology Studies: Dg Chest Port 1 View  Result Date: 02/13/2018 CLINICAL DATA:  Dyspnea EXAM: PORTABLE CHEST 1 VIEW COMPARISON:  None. FINDINGS: Normal heart size. Mild aortic atherosclerosis without aneurysm. Median sternotomy sutures are in place with evidence of prior mitral valvular repair. Left-sided ICD device with leads in the right atrium, coronary sinus and right ventricle are noted. Minimal atelectasis is seen at the left lung base. The lungs otherwise appear clear. No effusion or pneumothorax. Osteoarthritis about the included right AC and glenohumeral joints. IMPRESSION: No active disease. Electronically Signed   By: Shanon Brow  Randel Pigg M.D.   On: 02/13/2018 18:40     Marzetta Board, MD, PhD [facility] Pager [phone]  If 7PM-7AM, please contact night-coverage [email or web address] Password [email or web address] 02/14/2018, 11:50 AM

## 2018-02-14 NOTE — Evaluation (Signed)
Physical Therapy Evaluation Patient Details Name: Emily Abbott MRN: [id number] DOB: 08-08-1940 Today's Date: 02/14/2018   History of Present Illness  Emily Abbott is a 77 y.o. adult with medical history significant of COPD 1-1.5L O2 QHS, cardiomyopathy s/p BIV ICD, PAF on coumadin, DM2, HTN, prior PE. Admitted with SOB    Clinical Impression  Patient evaluated by Physical Therapy with no further acute PT needs identified. All education has been completed and the patient has no further questions. PTA pt living with daughter and and son in law, independent with mobility, ADLs, and driving. Pt today reports she is feeling overall better than when she was admitted and is ambulating near baseline. SpO2 WNL on 1L with ambulation, DOE 2/4.  Does not wish  See below for any follow-up Physical Therapy or equipment needs. PT is signing off. Thank you for this referral.       Follow Up Recommendations No PT follow up    Equipment Recommendations  None recommended by PT    Recommendations for Other Services       Precautions / Restrictions Precautions Precautions: Fall Precaution Comments: watch O2      Mobility  Bed Mobility Overal bed mobility: Independent                Transfers Overall transfer level: Independent Equipment used: None                Ambulation/Gait Ambulation/Gait assistance: Supervision Gait Distance (Feet): 150 Feet Assistive device: None Gait Pattern/deviations: WFL(Within Functional Limits) Gait velocity: decreased   General Gait Details: SpO2 WNL on 2L, DOE 2/4, no overt balance deficits.   Stairs            Wheelchair Mobility    Modified Rankin (Stroke Patients Only)       Balance Overall balance assessment: Mild deficits observed, not formally tested                                           Pertinent Vitals/Pain Pain Assessment: Faces Faces Pain Scale: Hurts a little bit Pain Location: chest  from coughing Pain Descriptors / Indicators: Discomfort    Home Living Family/patient expects to be discharged to:: Private residence Living Arrangements: Children;Other relatives Available Help at Discharge: (daughter son and Diplomatic Services operational officer ) Type of Home: House Home Access: Stairs to enter Entrance Stairs-Rails: Left Entrance Stairs-Number of Steps: 3 Home Layout: One level Home Equipment: Walker - 2 wheels      Prior Function Level of Independence: Independent         Comments: drives, indepednent with ADLs and mobility. wears 1.5L at home     Hand Dominance        Extremity/Trunk Assessment   Upper Extremity Assessment Upper Extremity Assessment: Overall WFL for tasks assessed    Lower Extremity Assessment Lower Extremity Assessment: Overall WFL for tasks assessed       Communication   Communication: No difficulties  Cognition Arousal/Alertness: Awake/alert                                            General Comments      Exercises     Assessment/Plan    PT Assessment Patent does not need any further PT services  PT  Problem List         PT Treatment Interventions      PT Goals (Current goals can be found in the Care Plan section)  Acute Rehab PT Goals Patient Stated Goal: return home when ready, does not want follow up PT PT Goal Formulation: With patient    Frequency     Barriers to discharge        Co-evaluation               AM-PAC PT "6 Clicks" Mobility  Outcome Measure Help needed turning from your back to your side while in a flat bed without using bedrails?: None Help needed moving from lying on your back to sitting on the side of a flat bed without using bedrails?: None Help needed moving to and from a bed to a chair (including a wheelchair)?: None Help needed standing up from a chair using your arms (e.g., wheelchair or bedside chair)?: None Help needed to walk in hospital room?: None Help needed  climbing 3-5 steps with a railing? : A Little 6 Click Score: 23    End of Session Equipment Utilized During Treatment: Gait belt;Oxygen Activity Tolerance: Patient tolerated treatment well Patient left: in bed;with call bell/phone within reach Nurse Communication: Mobility status PT Visit Diagnosis: Difficulty in walking, not elsewhere classified (R26.2)    Time: 5053-9767 PT Time Calculation (min) (ACUTE ONLY): 25 min   Charges:   PT Evaluation $PT Eval Low Complexity: 1 Low PT Treatments $Gait Training: 8-22 mins       Reinaldo Berber, PT, DPT Acute Rehabilitation Services Pager: [phone] Office: [place] 02/14/2018, 8:51 AM

## 2018-02-14 NOTE — Progress Notes (Addendum)
[place] for Coumadin and Heparin Indication: atrial fibrillation  Allergies  Allergen Reactions  . Adhesive [Tape] Other (See Comments)    Burning sensation  . Atorvastatin     Makes legs hurt   . Metformin And Related     "caused me kidney problems"   . Sulfonamide Derivatives Other (See Comments)    Unknown    Patient Measurements: Height: 5' 6.5" (168.9 cm) Weight: 208 lb (94.3 kg) IBW/kg (Calculated) : 60.45  Heparin dosing wt: 81 kg  Vital Signs: Temp: 98 F (36.7 C) (12/27 0723) Temp Source: Oral (12/27 0723) BP: 150/85 (12/27 0723) Pulse Rate: 72 (12/27 0723)  Labs: Recent Labs    02/13/18 1813 02/14/18 0908  HGB 11.0* 10.6*  HCT 37.2 33.3*  PLT 214 236  LABPROT 17.4* 18.6*  INR 1.44 1.57  HEPARINUNFRC  --  0.36  CREATININE 1.39*  --   TROPONINI <0.03  --     Estimated Creatinine Clearance (by C-G formula based on SCr of 1.39 mg/dL (H)) Female: 38.3 mL/min (A) Female: 46.7 mL/min (A)  Assessment: 76 y.o. F presents with SOB. Pt on Coumadin PTA for afib; INR subtherapeutic on admit. Pharmacy consulted to manage IV heparin bridge to Coumadin.  Heparin level is therapeutic at 0.36 on 1150 units/hr. INR is subtherapeutic at 1.57. No bleeding noted, CBC stable.  Home dose: 2.5mg  daily except for 5mg  Sun and Thur. Last dose 12/25 2200.  Goal of Therapy:  INR 2-3 Heparin level 0.3-0.7 units/ml Monitor platelets by anticoagulation protocol: Yes   Plan:  Repeat Coumadin 6 mg PO tonight  Continue heparin drip at 1150 units/hr Confirmatory heparin level at 14:00 Daily INR, CBC, and heparin level Monitor for s/sx of bleeding   Renold Genta, PharmD, BCPS Clinical Pharmacist Clinical phone for 02/14/2018 until 3p is [phone] 02/14/2018 11:20 AM  **Pharmacist phone directory can now be found on [email or web address] listed under Welcome**   Addendum: Confirmatory heparin level is therapeutic at 0.36 on 1150 units/hr.  Continue this rate and f/u in am.  Renold Genta, PharmD, BCPS Clinical Pharmacist 02/14/2018 2:45 PM

## 2018-02-14 NOTE — Evaluation (Signed)
Occupational Therapy Evaluation Patient Details Name: Emily Abbott MRN: [id number] DOB: 1940/03/01 Today's Date: 02/14/2018    History of Present Illness Emily Abbott is a 77 y.o. adult with medical history significant of COPD 1-1.5L O2 QHS, cardiomyopathy s/p BIV ICD, PAF on coumadin, DM2, HTN, prior PE. Admitted with SOB.  Dx:  COPD exacerbation    Clinical Impression   Patient evaluated by Occupational Therapy with no further acute OT needs identified. All education has been completed and the patient has no further questions. Pt able to perform ADLs with DOE 3/4, 02 sats 96% on RA.  Instructed pt on energy conservation techniques. Marland Kitchenw See below for any follow-up Occupational Therapy or equipment needs. OT is signing off. Thank you for this referral.      Follow Up Recommendations  No OT follow up;Supervision - Intermittent    Equipment Recommendations  None recommended by OT    Recommendations for Other Services       Precautions / Restrictions Precautions Precautions: Fall Precaution Comments: watch O2      Mobility Bed Mobility Overal bed mobility: Independent                Transfers Overall transfer level: Independent Equipment used: None                  Balance Overall balance assessment: Mild deficits observed, not formally tested                                         ADL either performed or assessed with clinical judgement   ADL Overall ADL's : Modified independent                                       General ADL Comments: Pt is able to perform ADLs standing at sink, but DOE 3/4.  02 sats 96% on RA.        Vision         Perception     Praxis      Pertinent Vitals/Pain Pain Assessment: Faces Faces Pain Scale: Hurts a little bit Pain Location: chest from coughing Pain Descriptors / Indicators: Discomfort     Hand Dominance     Extremity/Trunk Assessment Upper Extremity  Assessment Upper Extremity Assessment: Overall WFL for tasks assessed   Lower Extremity Assessment Lower Extremity Assessment: Overall WFL for tasks assessed       Communication Communication Communication: No difficulties   Cognition Arousal/Alertness: Awake/alert Behavior During Therapy: WFL for tasks assessed/performed;Anxious Overall Cognitive Status: Within Functional Limits for tasks assessed                                     General Comments  reviewed energy conservation techniques with pt emphasizing need to monitor DOE, sit for tasks, pace self, and to keep shower water tepid.  She was able to verbalize understanding of all     Exercises     Shoulder Instructions      Home Living Family/patient expects to be discharged to:: Private residence Living Arrangements: Children;Other relatives Available Help at Discharge: (daughter son and law grandaugther ) Type of Home: House Home Access: Stairs to enter [employer] of Steps: 3 Entrance Stairs-Rails: Left  Home Layout: One level     Bathroom Shower/Tub: Teacher, early years/pre: Standard     Home Equipment: Environmental consultant - 2 wheels;Shower seat          Prior Functioning/Environment Level of Independence: Independent        Comments: wears 02 at night and during the day when she feels SOB.  drives, indepednent with ADLs and mobility. wears 1.5L at home        OT Problem List: Cardiopulmonary status limiting activity;Decreased activity tolerance      OT Treatment/Interventions:      OT Goals(Current goals can be found in the care plan section) Acute Rehab OT Goals Patient Stated Goal: to get back to normal and breathe better  OT Goal Formulation: All assessment and education complete, DC therapy  OT Frequency:     Barriers to D/C:            Co-evaluation              AM-PAC OT "6 Clicks" Daily Activity     Outcome Measure Help from another person eating  meals?: None Help from another person taking care of personal grooming?: None Help from another person toileting, which includes using toliet, bedpan, or urinal?: None Help from another person bathing (including washing, rinsing, drying)?: None Help from another person to put on and taking off regular upper body clothing?: None Help from another person to put on and taking off regular lower body clothing?: None 6 Click Score: 24   End of Session Nurse Communication: Mobility status  Activity Tolerance: Patient tolerated treatment well Patient left: Other (comment)(standing at sink bathing )  OT Visit Diagnosis: Unsteadiness on feet (R26.81);Other (comment)(decreased endurance )                Time: 9622-2979 OT Time Calculation (min): 16 min Charges:  OT General Charges $OT Visit: 1 Visit OT Evaluation $OT Eval Low Complexity: 1 Low  Lucille Passy, OTR/L Acute Rehabilitation Services Pager [phone] Office [phone]   Lucille Passy M 02/14/2018, 11:12 AM

## 2018-02-15 ENCOUNTER — Encounter (HOSPITAL_COMMUNITY): Payer: Self-pay

## 2018-02-15 DIAGNOSIS — Z95810 Presence of automatic (implantable) cardiac defibrillator: Secondary | ICD-10-CM | POA: Diagnosis not present

## 2018-02-15 DIAGNOSIS — J9621 Acute and chronic respiratory failure with hypoxia: Secondary | ICD-10-CM | POA: Diagnosis not present

## 2018-02-15 DIAGNOSIS — J441 Chronic obstructive pulmonary disease with (acute) exacerbation: Secondary | ICD-10-CM | POA: Diagnosis not present

## 2018-02-15 DIAGNOSIS — I480 Paroxysmal atrial fibrillation: Secondary | ICD-10-CM | POA: Diagnosis not present

## 2018-02-15 DIAGNOSIS — I5022 Chronic systolic (congestive) heart failure: Secondary | ICD-10-CM | POA: Diagnosis not present

## 2018-02-15 DIAGNOSIS — E119 Type 2 diabetes mellitus without complications: Secondary | ICD-10-CM | POA: Diagnosis not present

## 2018-02-15 DIAGNOSIS — I10 Essential (primary) hypertension: Secondary | ICD-10-CM | POA: Diagnosis not present

## 2018-02-15 LAB — CBC
HCT: 32.5 % — ABNORMAL LOW (ref 36.0–46.0)
Hemoglobin: 10.2 g/dL — ABNORMAL LOW (ref 12.0–15.0)
MCH: 25.5 pg — ABNORMAL LOW (ref 26.0–34.0)
MCHC: 31.4 g/dL (ref 30.0–36.0)
MCV: 81.3 fL (ref 80.0–100.0)
Platelets: 239 10*3/uL (ref 150–400)
RBC: 4 MIL/uL (ref 3.87–5.11)
RDW: 16.1 % — ABNORMAL HIGH (ref 11.5–15.5)
WBC: 18.8 10*3/uL — ABNORMAL HIGH (ref 4.0–10.5)
nRBC: 0 % (ref 0.0–0.2)

## 2018-02-15 LAB — GLUCOSE, CAPILLARY
Glucose-Capillary: 185 mg/dL — ABNORMAL HIGH (ref 70–99)
Glucose-Capillary: 223 mg/dL — ABNORMAL HIGH (ref 70–99)
Glucose-Capillary: 235 mg/dL — ABNORMAL HIGH (ref 70–99)
Glucose-Capillary: 232 mg/dL — ABNORMAL HIGH (ref 70–99)

## 2018-02-15 LAB — BASIC METABOLIC PANEL
Anion gap: 13 (ref 5–15)
BUN: 28 mg/dL — AB (ref 8–23)
CO2: 22 mmol/L (ref 22–32)
Calcium: 9.3 mg/dL (ref 8.9–10.3)
Chloride: 103 mmol/L (ref 98–111)
Creatinine, Ser: 1.26 mg/dL — ABNORMAL HIGH (ref 0.44–1.00)
GFR calc Af Amer: 47 mL/min — ABNORMAL LOW (ref >60)
GFR calc non Af Amer: 40 mL/min — ABNORMAL LOW (ref >60)
GLUCOSE: 234 mg/dL — AB (ref 70–99)
Potassium: 4.4 mmol/L (ref 3.5–5.1)
Sodium: 138 mmol/L (ref 135–145)

## 2018-02-15 LAB — HEPARIN LEVEL (UNFRACTIONATED): Heparin Unfractionated: 0.45 IU/mL (ref 0.30–0.70)

## 2018-02-15 LAB — PROTIME-INR
INR: 1.9
Prothrombin Time: 21.5 seconds — ABNORMAL HIGH (ref 11.4–15.2)

## 2018-02-15 MED ORDER — IPRATROPIUM-ALBUTEROL 0.5-2.5 (3) MG/3ML IN SOLN
3.0000 mL | Freq: Three times a day (TID) | RESPIRATORY_TRACT | Status: DC
  Administered 2018-02-15 – 2018-02-16 (×4): 3 mL via RESPIRATORY_TRACT
  Filled 2018-02-15 (×4): qty 3

## 2018-02-15 MED ORDER — SALINE SPRAY 0.65 % NA SOLN
1.0000 | NASAL | Status: DC | PRN
  Filled 2018-02-15: qty 44

## 2018-02-15 MED ORDER — WARFARIN SODIUM 3 MG PO TABS
6.0000 mg | ORAL_TABLET | Freq: Once | ORAL | Status: AC
  Administered 2018-02-15: 6 mg via ORAL
  Filled 2018-02-15: qty 2

## 2018-02-15 NOTE — Progress Notes (Signed)
PROGRESS NOTE  Emily Abbott:[id number] DOB: 03/27/1940 DOA: 02/13/2018 PCP: Harlan Stains, MD   LOS: 0 days   Brief Narrative / Interim history: 77 year old female with history of COPD on baseline 1.5 L oxygen at nighttime, history of cardiomyopathy with biventricular pacer/ICD, PAF on Coumadin, type 2 diabetes mellitus, hypertension, prior PE who presents to the hospital with shortness of breath, diagnosed with COPD exacerbation.  Subjective: Continues to improve some however not yet at baseline, still having difficulties ambulating as much as she normally is at home.  Assessment & Plan: Principal Problem:   COPD exacerbation ([place]) Active Problems:   DM2 (diabetes mellitus, type 2) ([place])   Essential hypertension   PAROXYSMAL ATRIAL FIBRILLATION   Chronic systolic heart failure (HCC)   Biventricular implantable cardioverter-defibrillator in situ   History of pulmonary embolism   Acute on chronic respiratory failure with hypoxia (HCC)   Principal Problem Acute on chronic hypoxic respiratory failure due to COPD exacerbation -Patient was placed on antibiotics, steroids, nebulizers, continue, continue to wean off oxygen, at home she is on room air during daytime.  She is still oxygen dependent here, continues to remain tachypneic and dyspneic  -Flu negative  Additional Problems Paroxysmal A. fib -Continue anticoagulation with Coumadin, bridging with heparin, on Coreg, INR 1.9 this morning  Chronic systolic CHF -Most recently her EF has recovered, she appears euvolemic, continue home medications including Lasix  Type 2 diabetes mellitus -Continue sliding scale, CBGs fairly good fasting 185 this morning  Hypertension -Continue Norvasc and Coreg, blood pressure acceptable  Chronic kidney disease stage III -Baseline creatinine 1.1-1.3, currently at baseline at 1.26 this morning  Leukocytosis -Likely due to steroids, afebrile   Scheduled Meds: . amLODipine  5 mg  Oral Daily  . atorvastatin  5 mg Oral q1800  . budesonide (PULMICORT) nebulizer solution  0.25 mg Nebulization BID  . carvedilol  6.25 mg Oral BID WC  . ezetimibe  10 mg Oral Daily  . furosemide  40 mg Oral Daily  . HYDROcodone-acetaminophen  1 tablet Oral QHS  . insulin aspart  0-15 Units Subcutaneous TID WC  . insulin glargine  10 Units Subcutaneous Daily  . ipratropium-albuterol  3 mL Nebulization TID  . linagliptin  5 mg Oral Daily  . pantoprazole  40 mg Oral Daily  . PARoxetine  30 mg Oral Daily  . polyethylene glycol  17 g Oral Daily  . predniSONE  40 mg Oral Q breakfast  . warfarin  6 mg Oral ONCE-1800  . Warfarin - Pharmacist Dosing Inpatient   Does not apply q1800   Continuous Infusions: . cefTRIAXone (ROCEPHIN)  IV Stopped (02/15/18 0700)  . heparin 1,150 Units/hr (02/14/18 1530)   PRN Meds:.acetaminophen, ALPRAZolam, HYDROcodone-homatropine, levalbuterol, tiZANidine  DVT prophylaxis: coumadin Code Status: Full code Family Communication: no family at bedside  Disposition Plan: home when ready   Consultants:   None   Procedures:   None   Antimicrobials:  Ceftriaxone 12/26 >>   Objective: Vitals:   02/14/18 1703 02/14/18 1930 02/15/18 0727 02/15/18 0738  BP: (!) 158/80   (!) 163/82  Pulse: 78   61  Resp: (!) 24     Temp: 97.8 F (36.6 C)   97.6 F (36.4 C)  TempSrc: Oral   Oral  SpO2: 93% 96% 97% 98%  Weight:      Height:        Intake/Output Summary (Last 24 hours) at 02/15/2018 1204 Last data filed at 02/14/2018 1530 Gross per 24 hour  Intake 222.31 ml  Output -  Net 222.31 ml   Filed Weights   02/13/18 1715 02/13/18 2220  Weight: 96.6 kg 94.3 kg    Examination:  Constitutional: No significant distress Eyes: No scleral icterus seen ENMT: Moist mucous membranes Respiratory: End expiratory wheezing present, improved, no crackles, remains tachypneic with increased respiratory effort Cardiovascular: Regular rate and rhythm, no murmurs  appreciated.  No peripheral edema Abdomen: Soft, nontender, nondistended, positive bowel sounds Musculoskeletal: no clubbing / cyanosis.  Skin: No rashes seen Neurologic: No focal deficits Psychiatric: Normal judgment and insight. Alert and oriented x 3. Normal mood.    Data Reviewed: I have independently reviewed following labs and imaging studies   CBC: Recent Labs  Lab 02/13/18 1813 02/14/18 0908 02/15/18 0236  WBC 10.2 10.9* 18.8*  NEUTROABS 7.6  --   --   HGB 11.0* 10.6* 10.2*  HCT 37.2 33.3* 32.5*  MCV 82.3 80.6 81.3  PLT 214 236 403   Basic Metabolic Panel: Recent Labs  Lab 02/13/18 1813 02/15/18 0236  NA 136 138  K 3.8 4.4  CL 102 103  CO2 21* 22  GLUCOSE 227* 234*  BUN 16 28*  CREATININE 1.39* 1.26*  CALCIUM 9.2 9.3   GFR: Estimated Creatinine Clearance (by C-G formula based on SCr of 1.26 mg/dL (H)) Female: 42.3 mL/min (A) Female: 51.6 mL/min (A) Liver Function Tests: Recent Labs  Lab 02/13/18 1813  AST 20  ALT 16  ALKPHOS 55  BILITOT 0.6  PROT 7.1  ALBUMIN 3.7   No results for input(s): LIPASE, AMYLASE in the last 168 hours. No results for input(s): AMMONIA in the last 168 hours. Coagulation Profile: Recent Labs  Lab 02/13/18 1813 02/14/18 0908 02/15/18 0236  INR 1.44 1.57 1.90   Cardiac Enzymes: Recent Labs  Lab 02/13/18 1813  TROPONINI <0.03   BNP (last 3 results) No results for input(s): PROBNP in the last 8760 hours. HbA1C: No results for input(s): HGBA1C in the last 72 hours. CBG: Recent Labs  Lab 02/14/18 0724 02/14/18 1149 02/14/18 1704 02/14/18 2105 02/15/18 0736  GLUCAP 299* 323* 220* 282* 185*   Lipid Profile: No results for input(s): CHOL, HDL, LDLCALC, TRIG, CHOLHDL, LDLDIRECT in the last 72 hours. Thyroid Function Tests: No results for input(s): TSH, T4TOTAL, FREET4, T3FREE, THYROIDAB in the last 72 hours. Anemia Panel: No results for input(s): VITAMINB12, FOLATE, FERRITIN, TIBC, IRON, RETICCTPCT in the last  72 hours. Urine analysis:    Component Value Date/Time   COLORURINE YELLOW 02/13/2018 2014   APPEARANCEUR CLEAR 02/13/2018 2014   LABSPEC 1.013 02/13/2018 2014   PHURINE 5.0 02/13/2018 2014   GLUCOSEU NEGATIVE 02/13/2018 2014   HGBUR SMALL (A) 02/13/2018 2014   [place] NEGATIVE 02/13/2018 2014   [place] NEGATIVE 02/13/2018 2014   PROTEINUR NEGATIVE 02/13/2018 2014   UROBILINOGEN 0.2 12/19/2007 0144   NITRITE NEGATIVE 02/13/2018 2014   LEUKOCYTESUR NEGATIVE 02/13/2018 2014   Sepsis Labs: Invalid input(s): PROCALCITONIN, LACTICIDVEN  No results found for this or any previous visit (from the past 240 hour(s)).    Radiology Studies: Dg Chest Port 1 View  Result Date: 02/13/2018 CLINICAL DATA:  Dyspnea EXAM: PORTABLE CHEST 1 VIEW COMPARISON:  None. FINDINGS: Normal heart size. Mild aortic atherosclerosis without aneurysm. Median sternotomy sutures are in place with evidence of prior mitral valvular repair. Left-sided ICD device with leads in the right atrium, coronary sinus and right ventricle are noted. Minimal atelectasis is seen at the left lung base. The lungs otherwise appear clear. No  effusion or pneumothorax. Osteoarthritis about the included right AC and glenohumeral joints. IMPRESSION: No active disease. Electronically Signed   By: [name] Royalty M.D.   On: 02/13/2018 18:40     Marzetta Board, MD, PhD [facility] Pager [phone]  If 7PM-7AM, please contact night-coverage [email or web address] Password [facility] 02/15/2018, 12:04 PM

## 2018-02-15 NOTE — Progress Notes (Signed)
[place] for Coumadin and Heparin Indication: atrial fibrillation  Allergies  Allergen Reactions  . Adhesive [Tape] Other (See Comments)    Burning sensation  . Atorvastatin     Makes legs hurt   . Metformin And Related     "caused me kidney problems"   . Sulfonamide Derivatives Other (See Comments)    Unknown    Patient Measurements: Height: 5' 6.5" (168.9 cm) Weight: 208 lb (94.3 kg) IBW/kg (Calculated) : 60.45  Heparin dosing wt: 81 kg  Vital Signs: Temp: 97.6 F (36.4 C) (12/28 0738) Temp Source: Oral (12/28 0738) BP: 163/82 (12/28 0738) Pulse Rate: 61 (12/28 0738)  Labs: Recent Labs    02/13/18 1813 02/14/18 0908 02/14/18 1347 02/15/18 0236  HGB 11.0* 10.6*  --  10.2*  HCT 37.2 33.3*  --  32.5*  PLT 214 236  --  239  LABPROT 17.4* 18.6*  --  21.5*  INR 1.44 1.57  --  1.90  HEPARINUNFRC  --  0.36 0.36 0.45  CREATININE 1.39*  --   --  1.26*  TROPONINI <0.03  --   --   --     Estimated Creatinine Clearance (by C-G formula based on SCr of 1.26 mg/dL (H)) Female: 42.3 mL/min (A) Female: 51.6 mL/min (A)  Assessment: 77 y.o. F presents with SOB. Pt on Coumadin PTA for afib; INR subtherapeutic on admit. Pharmacy consulted to manage IV heparin bridge to Coumadin.  Heparin level is therapeutic at 0.45 on 1150 units/hr. INR increasing but subtherapeutic at 1.9. No bleeding noted, CBC stable.   Home dose: 2.5mg  daily except for 5mg  Sun and Thur. Last home dose 12/25 2200.  Goal of Therapy:  INR 2-3 Heparin level 0.3-0.7 units/ml Monitor platelets by anticoagulation protocol: Yes   Plan:  Repeat Coumadin 6 mg PO x1 dose tonight  Continue heparin drip at 1150 units/hr Daily INR, CBC, and heparin level Monitor for s/sx of bleeding  Gwenlyn Found, Sherian Rein D PGY1 Pharmacy Resident  Phone [phone] 02/15/2018   9:08 AM

## 2018-02-16 DIAGNOSIS — Z95810 Presence of automatic (implantable) cardiac defibrillator: Secondary | ICD-10-CM | POA: Diagnosis not present

## 2018-02-16 DIAGNOSIS — R69 Illness, unspecified: Secondary | ICD-10-CM | POA: Diagnosis not present

## 2018-02-16 DIAGNOSIS — I480 Paroxysmal atrial fibrillation: Secondary | ICD-10-CM | POA: Diagnosis not present

## 2018-02-16 DIAGNOSIS — I10 Essential (primary) hypertension: Secondary | ICD-10-CM | POA: Diagnosis not present

## 2018-02-16 DIAGNOSIS — E119 Type 2 diabetes mellitus without complications: Secondary | ICD-10-CM | POA: Diagnosis not present

## 2018-02-16 DIAGNOSIS — I5022 Chronic systolic (congestive) heart failure: Secondary | ICD-10-CM | POA: Diagnosis not present

## 2018-02-16 DIAGNOSIS — J441 Chronic obstructive pulmonary disease with (acute) exacerbation: Secondary | ICD-10-CM | POA: Diagnosis not present

## 2018-02-16 DIAGNOSIS — J9621 Acute and chronic respiratory failure with hypoxia: Secondary | ICD-10-CM | POA: Diagnosis not present

## 2018-02-16 DIAGNOSIS — J449 Chronic obstructive pulmonary disease, unspecified: Secondary | ICD-10-CM | POA: Diagnosis not present

## 2018-02-16 LAB — CBC
HCT: 32.9 % — ABNORMAL LOW (ref 36.0–46.0)
Hemoglobin: 9.9 g/dL — ABNORMAL LOW (ref 12.0–15.0)
MCH: 24.8 pg — ABNORMAL LOW (ref 26.0–34.0)
MCHC: 30.1 g/dL (ref 30.0–36.0)
MCV: 82.3 fL (ref 80.0–100.0)
NRBC: 0 % (ref 0.0–0.2)
Platelets: 234 10*3/uL (ref 150–400)
RBC: 4 MIL/uL (ref 3.87–5.11)
RDW: 16.1 % — AB (ref 11.5–15.5)
WBC: 16 10*3/uL — ABNORMAL HIGH (ref 4.0–10.5)

## 2018-02-16 LAB — HEPARIN LEVEL (UNFRACTIONATED): Heparin Unfractionated: 0.23 IU/mL — ABNORMAL LOW (ref 0.30–0.70)

## 2018-02-16 LAB — PROTIME-INR
INR: 2.39
PROTHROMBIN TIME: 25.8 s — AB (ref 11.4–15.2)

## 2018-02-16 LAB — GLUCOSE, CAPILLARY: Glucose-Capillary: 136 mg/dL — ABNORMAL HIGH (ref 70–99)

## 2018-02-16 MED ORDER — HYDROCODONE-HOMATROPINE 5-1.5 MG/5ML PO SYRP: 5.0000 mL | ORAL_SOLUTION | Freq: Four times a day (QID) | ORAL | 0 refills | Status: DC | PRN

## 2018-02-16 MED ORDER — DOXYCYCLINE HYCLATE 100 MG PO CAPS: 100.0000 mg | ORAL_CAPSULE | Freq: Two times a day (BID) | ORAL | 0 refills | Status: DC

## 2018-02-16 MED ORDER — PREDNISONE 20 MG PO TABS: 20.0000 mg | ORAL_TABLET | Freq: Every day | ORAL | 0 refills | Status: DC

## 2018-02-16 MED ORDER — IPRATROPIUM-ALBUTEROL 0.5-2.5 (3) MG/3ML IN SOLN: 3.0000 mL | Freq: Four times a day (QID) | RESPIRATORY_TRACT | 2 refills | Status: AC | PRN

## 2018-02-16 NOTE — Progress Notes (Signed)
Patient was stable at discharge. I removed their IV. We reviewed the discharge education. Patient/Family verbalized understanding and had no further questions. Patient left with prescription/s in hand.  

## 2018-02-16 NOTE — Care Management Note (Signed)
Case Management Note  Patient Details  Name: Emily Abbott MRN: [id number] Date of Birth: 1940-04-13  Subjective/Objective:                    Action/Plan:  Requested nebulizer to be delivered to patient's room prior to DC.   Expected Discharge Date:  02/16/18               Expected Discharge Plan:     In-House Referral:     Discharge planning Services  CM Consult  Post Acute Care Choice:  Durable Medical Equipment Choice offered to:     DME Arranged:  Nebulizer machine DME Agency:  [place]:    [facility] Agency:     Status of Service:  Completed, signed off  If discussed at [place] of Stay Meetings, dates discussed:    Additional Comments:  Carles Collet, RN 02/16/2018, 8:42 AM

## 2018-02-16 NOTE — Progress Notes (Signed)
Emily Abbott for Coumadin and Heparin Indication: atrial fibrillation  Allergies  Allergen Reactions  . Adhesive [Tape] Other (See Comments)    Burning sensation  . Atorvastatin     Makes legs hurt   . Metformin And Related     "caused me kidney problems"   . Sulfonamide Derivatives Other (See Comments)    Unknown    Patient Measurements: Height: 5' 6.5" (168.9 cm) Weight: 208 lb (94.3 kg) IBW/kg (Calculated) : 60.45  Heparin dosing wt: 81 kg  Vital Signs: Temp: 97.9 F (36.6 C) (12/28 2329) Temp Source: Oral (12/28 2329) BP: 168/87 (12/28 2329) Pulse Rate: 71 (12/28 2329)  Labs: Recent Labs    02/13/18 1813  02/14/18 0908 02/14/18 1347 02/15/18 0236 02/16/18 0212  HGB 11.0*  --  10.6*  --  10.2* 9.9*  HCT 37.2  --  33.3*  --  32.5* 32.9*  PLT 214  --  236  --  239 234  LABPROT 17.4*  --  18.6*  --  21.5* 25.8*  INR 1.44  --  1.57  --  1.90 2.39  HEPARINUNFRC  --    < > 0.36 0.36 0.45 0.23*  CREATININE 1.39*  --   --   --  1.26*  --   TROPONINI <0.03  --   --   --   --   --    < > = values in this interval not displayed.    Estimated Creatinine Clearance (by C-G formula based on SCr of 1.26 mg/dL (H)) Female: 42.3 mL/min (A) Female: 51.6 mL/min (A)  Assessment: 77 y.o. F presents with SOB. Pt on Coumadin PTA for afib; INR subtherapeutic on admit. Pharmacy consulted to manage IV heparin bridge to Coumadin.  Heparin level is subtherapeutic at 0.23 on 1150 units/hr. INR therapeutic x1day at 2.39. No bleeding or problems with heparin drip per nurse, CBC stable.   Home dose: 2.5mg  daily except for 5mg  Sun and Thur. Last home dose 12/25 2200.  Goal of Therapy:  INR 2-3 Heparin level 0.3-0.7 units/ml Monitor platelets by anticoagulation protocol: Yes   Plan:  Spoke with MD, patient will be discharged today on home warfarin dosing.  Will discontinue heparin drip  Gwenlyn Found, Sherian Rein D PGY1 Pharmacy Resident  Phone [phone] 02/16/2018   7:52 AM

## 2018-02-16 NOTE — Discharge Instructions (Signed)
Follow with Harlan Stains, MD in 5-7 days  Please get a complete blood count and chemistry panel checked by your Primary MD at your next visit, and again as instructed by your Primary MD. Please get your medications reviewed and adjusted by your Primary MD.  Please request your Primary MD to go over all Hospital Tests and Procedure/Radiological results at the follow up, please get all Hospital records sent to your Prim MD by signing hospital release before you go home.  If you had Pneumonia of Lung problems at the Hospital: Please get a 2 view Chest X ray done in 6-8 weeks after hospital discharge or sooner if instructed by your Primary MD.  If you have Congestive Heart Failure: Please call your Cardiologist or Primary MD anytime you have any of the following symptoms:  1) 3 pound weight gain in 24 hours or 5 pounds in 1 week  2) shortness of breath, with or without a dry hacking cough  3) swelling in the hands, feet or stomach  4) if you have to sleep on extra pillows at night in order to breathe  Follow cardiac low salt diet and 1.5 lit/day fluid restriction.  If you have diabetes Accuchecks 4 times/day, Once in AM empty stomach and then before each meal. Log in all results and show them to your primary doctor at your next visit. If any glucose reading is under 80 or above 300 call your primary MD immediately.  If you have Seizure/Convulsions/Epilepsy: Please do not drive, operate heavy machinery, participate in activities at heights or participate in high speed sports until you have seen by Primary MD or a Neurologist and advised to do so again.  If you had Gastrointestinal Bleeding: Please ask your Primary MD to check a complete blood count within one week of discharge or at your next visit. Your endoscopic/colonoscopic biopsies that are pending at the time of discharge, will also need to followed by your Primary MD.  Get Medicines reviewed and adjusted. Please take all your  medications with you for your next visit with your Primary MD  Please request your Primary MD to go over all hospital tests and procedure/radiological results at the follow up, please ask your Primary MD to get all Hospital records sent to his/her office.  If you experience worsening of your admission symptoms, develop shortness of breath, life threatening emergency, suicidal or homicidal thoughts you must seek medical attention immediately by calling 911 or calling your MD immediately  if symptoms less severe.  You must read complete instructions/literature along with all the possible adverse reactions/side effects for all the Medicines you take and that have been prescribed to you. Take any new Medicines after you have completely understood and accpet all the possible adverse reactions/side effects.   Do not drive or operate heavy machinery when taking Pain medications.   Do not take more than prescribed Pain, Sleep and Anxiety Medications  Special Instructions: If you have smoked or chewed Tobacco  in the last 2 yrs please stop smoking, stop any regular Alcohol  and or any Recreational drug use.  Wear Seat belts while driving.  Please note You were cared for by a hospitalist during your hospital stay. If you have any questions about your discharge medications or the care you received while you were in the hospital after you are discharged, you can call the unit and asked to speak with the hospitalist on call if the hospitalist that took care of you is not available. Once  you are discharged, your primary care physician will handle any further medical issues. Please note that NO REFILLS for any discharge medications will be authorized once you are discharged, as it is imperative that you return to your primary care physician (or establish a relationship with a primary care physician if you do not have one) for your aftercare needs so that they can reassess your need for medications and monitor your  lab values.  You can reach the hospitalist office at phone [phone] or fax [phone]   If you do not have a primary care physician, you can call [phone] for a physician referral.  Activity: As tolerated with Full fall precautions use walker/cane & assistance as needed  Diet: diabetic  Disposition Home

## 2018-02-16 NOTE — Discharge Summary (Signed)
Physician Discharge Summary  Emily Abbott ZES:[id number] DOB: [birth year] DOA: 02/13/2018  PCP: Harlan Stains, MD  Admit date: 02/13/2018 Discharge date: 02/16/2018  Admitted From: Home Disposition: Home  Recommendations for Outpatient Follow-up:  1. Follow up with PCP in 1-2 weeks  Home Health: None Equipment/Devices: Home nebulizer  Discharge Condition: Stable CODE STATUS: Full code Diet recommendation: Regular  HPI: Per Dr. Alcario Drought, Emily Abbott is a 77 y.o. adult with medical history significant of COPD 1-1.5L O2 QHS, cardiomyopathy s/p BIV ICD, PAF on coumadin, DM2, HTN, prior PE. Patient presents to the ED with c/o SOB.  Has had several weeks of bronchitis.  This persists despite recent short course of steroids.  Seen by PCP on Monday who put her on ABx. FD called today, pt 79% on RA.  Hospital Course:  Principal Problem Acute on chronic hypoxic respiratory failure due to COPD exacerbation -Patient was placed on antibiotics, steroids, nebulizers.  She improved and clinically returned to baseline.  She is able to ambulate in the room in the hallway without difficulties or significant dyspnea and without supplemental oxygen.  She has chronic oxygen at home which she uses at nighttime.  Her influenza was negative.  She will be discharged home in stable condition and was advised to follow-up with PCP within 1 to 2 weeks, she will finish a course of doxycycline and a short prednisone taper as an outpatient  Additional Problems Paroxysmal A. Fib -Continue anticoagulation with Coumadin, she was found to be subtherapeutic on admission and was placed on heparin infusion, INR on discharge 2.39.  Further INR follow-up as an outpatient Chronic systolic CHF -Most recently her EF has recovered, she appears euvolemic, continue home medications including Lasix Type 2 diabetes mellitus -continue home regimen, quick prednisone taper on discharge Hypertension -resume home  medications Chronic kidney disease stage III -Baseline creatinine 1.1-1.3, creatinine at baseline Leukocytosis -Likely due to steroids, afebrile, improving  Discharge Diagnoses:  Principal Problem:   COPD exacerbation ([place]) Active Problems:   DM2 (diabetes mellitus, type 2) ([place])   Essential hypertension   PAROXYSMAL ATRIAL FIBRILLATION   Chronic systolic heart failure ([facility])   Biventricular implantable cardioverter-defibrillator in situ   History of pulmonary embolism   Acute on chronic respiratory failure with hypoxia ([place])     Discharge Instructions   Allergies as of 02/16/2018      Reactions   Adhesive [tape] Other (See Comments)   Burning sensation   Atorvastatin    Makes legs hurt    Metformin And Related    "caused me kidney problems"    Sulfonamide Derivatives Other (See Comments)   Unknown      Medication List    STOP taking these medications   amoxicillin 875 MG tablet Commonly known as:  AMOXIL     TAKE these medications   acetaminophen 325 MG tablet Commonly known as:  TYLENOL Take 2 tablets (650 mg total) by mouth every 6 (six) hours as needed for mild pain or moderate pain.   albuterol 108 (90 Base) MCG/ACT inhaler Commonly known as:  PROAIR HFA Inhale 1-2 puffs into the lungs every 6 (six) hours as needed for wheezing or shortness of breath.   ALPRAZolam 0.5 MG tablet Commonly known as:  XANAX Take 0.5 mg by mouth 2 (two) times daily as needed for anxiety.   amLODipine 5 MG tablet Commonly known as:  NORVASC Take 5 mg by mouth daily.   atorvastatin 10 MG tablet Commonly known as:  LIPITOR Take  5 mg by mouth daily.   Azelastine HCl 137 MCG/SPRAY Soln Place 2 sprays into both nostrils at bedtime.   BELSOMRA 10 MG Tabs Generic drug:  Suvorexant Take 10 mg by mouth at bedtime as needed (for sleep).   carvedilol 6.25 MG tablet Commonly known as:  COREG Take 6.25 mg by mouth 2 (two) times daily with a meal.   diphenhydrAMINE 25 MG  tablet Commonly known as:  BENADRYL Take 12.5 mg by mouth daily as needed for itching.   doxycycline 100 MG capsule Commonly known as:  VIBRAMYCIN Take 1 capsule (100 mg total) by mouth 2 (two) times daily.   ezetimibe 10 MG tablet Commonly known as:  ZETIA Take 10 mg by mouth daily.   furosemide 20 MG tablet Commonly known as:  LASIX TAKE 2 TABLETS BY MOUTH EVERY DAY What changed:    how much to take  how to take this  when to take this   glimepiride 1 MG tablet Commonly known as:  AMARYL Take 1 mg by mouth daily.   glipiZIDE 5 MG tablet Commonly known as:  GLUCOTROL Take 5 mg by mouth every morning.   HYDROcodone-acetaminophen 5-325 MG tablet Commonly known as:  NORCO/VICODIN Take 1 tablet by mouth at bedtime.   HYDROcodone-homatropine 5-1.5 MG/5ML syrup Commonly known as:  HYCODAN Take 5 mLs by mouth every 6 (six) hours as needed for cough.   ipratropium-albuterol 0.5-2.5 (3) MG/3ML Soln Commonly known as:  DUONEB Take 3 mLs by nebulization every 6 (six) hours as needed.   linagliptin 5 MG Tabs tablet Commonly known as:  TRADJENTA Take 5 mg by mouth daily.   magnesium gluconate 500 MG tablet Commonly known as:  MAGONATE Take 500 mg by mouth daily as needed (for leg cramps).   omeprazole 40 MG capsule Commonly known as:  PRILOSEC Take 40 mg by mouth daily.   OXYGEN Inhale 1.5 L into the lungs at bedtime.   PARoxetine 30 MG tablet Commonly known as:  PAXIL Take 30 mg by mouth daily.   predniSONE 20 MG tablet Commonly known as:  DELTASONE Take 1 tablet (20 mg total) by mouth daily with breakfast.   tiZANidine 2 MG tablet Commonly known as:  ZANAFLEX Take 2 mg by mouth at bedtime as needed for muscle spasms.   TRELEGY ELLIPTA 100-62.5-25 MCG/INH Aepb Generic drug:  Fluticasone-Umeclidin-Vilant Inhale 1 puff into the lungs daily.   warfarin 5 MG tablet Commonly known as:  COUMADIN Take 2.5-5 mg by mouth See admin instructions. Take 5 mg by  mouth daily on Sunday and Thursday. Take 2.5 mg by mouth daily on all other days            Durable Medical Equipment  (From admission, onward)         Start     Ordered   02/16/18 0752  For home use only DME Nebulizer machine  Once    Question:  Patient needs a nebulizer to treat with the following condition  Answer:  COPD (chronic obstructive pulmonary disease) ([place])   02/16/18 0751         Follow-up Information    Harlan Stains, MD. Schedule an appointment as soon as possible for a visit in 1 week(s).   Specialty:  Family Medicine Contact information: [place] [address] [phone]        Evans Lance, MD .   Specialty:  Cardiology Contact information: [phone]  [facility] Follow up.   Why:  Nebulizer to be delivered to patient's room prior to DC Contact information: [address] [place] [address] [phone]           Consultations:  None   Procedures/Studies:  Dg Chest Port 1 View  Result Date: 02/13/2018 CLINICAL DATA:  Dyspnea EXAM: PORTABLE CHEST 1 VIEW COMPARISON:  None. FINDINGS: Normal heart size. Mild aortic atherosclerosis without aneurysm. Median sternotomy sutures are in place with evidence of prior mitral valvular repair. Left-sided ICD device with leads in the right atrium, coronary sinus and right ventricle are noted. Minimal atelectasis is seen at the left lung base. The lungs otherwise appear clear. No effusion or pneumothorax. Osteoarthritis about the included right AC and glenohumeral joints. IMPRESSION: No active disease. Electronically Signed   By: [name] Royalty M.D.   On: 02/13/2018 18:40      Subjective: - no chest pain, shortness of breath, no abdominal pain, nausea or vomiting.    Discharge Exam: Vitals:   02/15/18 2329 02/16/18 0744  BP: (!) 168/87   Pulse: 71   Resp:    Temp: 97.9 F  (36.6 C)   SpO2: 98% 90%    General: Pt is alert, awake, not in acute distress Cardiovascular: RRR, S1/S2 +, no rubs, no gallops Respiratory: CTA bilaterally, no wheezing, no rhonchi Abdominal: Soft, NT, ND, bowel sounds + Extremities: no edema, no cyanosis    The results of significant diagnostics from this hospitalization (including imaging, microbiology, ancillary and laboratory) are listed below for reference.     Microbiology: No results found for this or any previous visit (from the past 240 hour(s)).   Labs: BNP (last 3 results) Recent Labs    02/13/18 1813  BNP 754.4*   Basic Metabolic Panel: Recent Labs  Lab 02/13/18 1813 02/15/18 0236  NA 136 138  K 3.8 4.4  CL 102 103  CO2 21* 22  GLUCOSE 227* 234*  BUN 16 28*  CREATININE 1.39* 1.26*  CALCIUM 9.2 9.3   Liver Function Tests: Recent Labs  Lab 02/13/18 1813  AST 20  ALT 16  ALKPHOS 55  BILITOT 0.6  PROT 7.1  ALBUMIN 3.7   No results for input(s): LIPASE, AMYLASE in the last 168 hours. No results for input(s): AMMONIA in the last 168 hours. CBC: Recent Labs  Lab 02/13/18 1813 02/14/18 0908 02/15/18 0236 02/16/18 0212  WBC 10.2 10.9* 18.8* 16.0*  NEUTROABS 7.6  --   --   --   HGB 11.0* 10.6* 10.2* 9.9*  HCT 37.2 33.3* 32.5* 32.9*  MCV 82.3 80.6 81.3 82.3  PLT 214 236 239 234   Cardiac Enzymes: Recent Labs  Lab 02/13/18 1813  TROPONINI <0.03   BNP: Invalid input(s): POCBNP CBG: Recent Labs  Lab 02/15/18 0736 02/15/18 1209 02/15/18 1635 02/15/18 2154 02/16/18 0758  GLUCAP 185* 223* 235* 232* 136*   D-Dimer No results for input(s): DDIMER in the last 72 hours. Hgb A1c No results for input(s): HGBA1C in the last 72 hours. Lipid Profile No results for input(s): CHOL, HDL, LDLCALC, TRIG, CHOLHDL, LDLDIRECT in the last 72 hours. Thyroid function studies No results for input(s): TSH, T4TOTAL, T3FREE, THYROIDAB in the last 72 hours.  Invalid input(s): FREET3 Anemia work  up No results for input(s): VITAMINB12, FOLATE, FERRITIN, TIBC, IRON, RETICCTPCT in the last 72 hours. Urinalysis    Component Value Date/Time   COLORURINE YELLOW  02/13/2018 2014   APPEARANCEUR CLEAR 02/13/2018 2014   LABSPEC 1.013 02/13/2018 2014   PHURINE 5.0 02/13/2018 2014   GLUCOSEU NEGATIVE 02/13/2018 2014   HGBUR SMALL (A) 02/13/2018 2014   [place] NEGATIVE 02/13/2018 2014   [place] NEGATIVE 02/13/2018 2014   PROTEINUR NEGATIVE 02/13/2018 2014   UROBILINOGEN 0.2 12/19/2007 0144   NITRITE NEGATIVE 02/13/2018 2014   LEUKOCYTESUR NEGATIVE 02/13/2018 2014   Sepsis Labs Invalid input(s): PROCALCITONIN,  WBC,  LACTICIDVEN   Time coordinating discharge: 35 minutes  SIGNED:  Marzetta Board, MD  [facility] 02/16/2018, 1:49 PM Pager [phone]  If 7PM-7AM, please contact night-coverage [email or web address] Password [email or web address]

## 2018-02-18 LAB — CUP PACEART REMOTE DEVICE CHECK
Battery Remaining Longevity: 99 mo
Battery Remaining Percentage: 95.5 %
Battery Voltage: 3.01 V
Brady Statistic AP VP Percent: 12 %
Brady Statistic AP VS Percent: 1 %
Brady Statistic RA Percent Paced: 3.9 %
Date Time Interrogation Session: 20191029060037
Implantable Lead Implant Date: 20110127
Implantable Lead Implant Date: 20110127
Implantable Lead Implant Date: 20110127
Implantable Lead Location: 753858
Implantable Lead Location: 753859
Implantable Lead Location: 753860
Implantable Lead Model: 7122
Implantable Pulse Generator Implant Date: 20190418
Lead Channel Impedance Value: 430 Ohm
Lead Channel Impedance Value: 510 Ohm
Lead Channel Impedance Value: 930 Ohm
Lead Channel Pacing Threshold Amplitude: 0.5 V
Lead Channel Pacing Threshold Amplitude: 1 V
Lead Channel Pacing Threshold Pulse Width: 0.5 ms
Lead Channel Pacing Threshold Pulse Width: 0.5 ms
Lead Channel Pacing Threshold Pulse Width: 0.6 ms
Lead Channel Sensing Intrinsic Amplitude: 2.8 mV
Lead Channel Setting Pacing Amplitude: 2 V
Lead Channel Setting Pacing Amplitude: 2 V
Lead Channel Setting Pacing Amplitude: 2.5 V
Lead Channel Setting Pacing Pulse Width: 0.5 ms
Lead Channel Setting Pacing Pulse Width: 0.6 ms
Lead Channel Setting Sensing Sensitivity: 2 mV
MDC IDC MSMT LEADCHNL LV PACING THRESHOLD AMPLITUDE: 1.5 V
MDC IDC MSMT LEADCHNL RV SENSING INTR AMPL: 12 mV
MDC IDC STAT BRADY AS VP PERCENT: 88 %
MDC IDC STAT BRADY AS VS PERCENT: 1 %
Pulse Gen Model: 3222
Pulse Gen Serial Number: 9393517

## 2018-02-21 NOTE — Progress Notes (Signed)
No ICM remote transmission received for 02/18/2018 and next ICM transmission scheduled for 03/20/2018.    

## 2018-02-24 DIAGNOSIS — N183 Chronic kidney disease, stage 3 (moderate): Secondary | ICD-10-CM | POA: Diagnosis not present

## 2018-02-24 DIAGNOSIS — I130 Hypertensive heart and chronic kidney disease with heart failure and stage 1 through stage 4 chronic kidney disease, or unspecified chronic kidney disease: Secondary | ICD-10-CM | POA: Diagnosis not present

## 2018-02-24 DIAGNOSIS — D72829 Elevated white blood cell count, unspecified: Secondary | ICD-10-CM | POA: Diagnosis not present

## 2018-02-24 DIAGNOSIS — E1121 Type 2 diabetes mellitus with diabetic nephropathy: Secondary | ICD-10-CM | POA: Diagnosis not present

## 2018-02-24 DIAGNOSIS — J9621 Acute and chronic respiratory failure with hypoxia: Secondary | ICD-10-CM | POA: Diagnosis not present

## 2018-02-24 DIAGNOSIS — J438 Other emphysema: Secondary | ICD-10-CM | POA: Diagnosis not present

## 2018-02-24 DIAGNOSIS — I480 Paroxysmal atrial fibrillation: Secondary | ICD-10-CM | POA: Diagnosis not present

## 2018-02-24 DIAGNOSIS — J441 Chronic obstructive pulmonary disease with (acute) exacerbation: Secondary | ICD-10-CM | POA: Diagnosis not present

## 2018-02-24 DIAGNOSIS — Z7984 Long term (current) use of oral hypoglycemic drugs: Secondary | ICD-10-CM | POA: Diagnosis not present

## 2018-02-24 DIAGNOSIS — I429 Cardiomyopathy, unspecified: Secondary | ICD-10-CM | POA: Diagnosis not present

## 2018-02-24 DIAGNOSIS — Z7901 Long term (current) use of anticoagulants: Secondary | ICD-10-CM | POA: Diagnosis not present

## 2018-03-12 DIAGNOSIS — D72829 Elevated white blood cell count, unspecified: Secondary | ICD-10-CM | POA: Diagnosis not present

## 2018-03-12 DIAGNOSIS — Z7901 Long term (current) use of anticoagulants: Secondary | ICD-10-CM | POA: Diagnosis not present

## 2018-03-24 NOTE — Progress Notes (Signed)
No ICM remote transmission received for 03/20/2018 and next ICM transmission scheduled for 04/01/2018.

## 2018-03-25 DIAGNOSIS — J449 Chronic obstructive pulmonary disease, unspecified: Secondary | ICD-10-CM | POA: Diagnosis not present

## 2018-03-25 DIAGNOSIS — E213 Hyperparathyroidism, unspecified: Secondary | ICD-10-CM | POA: Diagnosis not present

## 2018-03-25 DIAGNOSIS — E559 Vitamin D deficiency, unspecified: Secondary | ICD-10-CM | POA: Diagnosis not present

## 2018-03-25 DIAGNOSIS — D509 Iron deficiency anemia, unspecified: Secondary | ICD-10-CM | POA: Diagnosis not present

## 2018-03-25 DIAGNOSIS — E1121 Type 2 diabetes mellitus with diabetic nephropathy: Secondary | ICD-10-CM | POA: Diagnosis not present

## 2018-03-25 DIAGNOSIS — E785 Hyperlipidemia, unspecified: Secondary | ICD-10-CM | POA: Diagnosis not present

## 2018-03-25 DIAGNOSIS — J9611 Chronic respiratory failure with hypoxia: Secondary | ICD-10-CM | POA: Diagnosis not present

## 2018-03-25 DIAGNOSIS — N183 Chronic kidney disease, stage 3 (moderate): Secondary | ICD-10-CM | POA: Diagnosis not present

## 2018-03-25 DIAGNOSIS — R69 Illness, unspecified: Secondary | ICD-10-CM | POA: Diagnosis not present

## 2018-03-25 DIAGNOSIS — E113299 Type 2 diabetes mellitus with mild nonproliferative diabetic retinopathy without macular edema, unspecified eye: Secondary | ICD-10-CM | POA: Diagnosis not present

## 2018-03-27 DIAGNOSIS — J438 Other emphysema: Secondary | ICD-10-CM | POA: Diagnosis not present

## 2018-03-31 ENCOUNTER — Encounter: Payer: Self-pay | Admitting: Cardiology

## 2018-04-04 ENCOUNTER — Telehealth: Payer: Self-pay

## 2018-04-04 NOTE — Telephone Encounter (Signed)
Left message for patient to remind of missed remote transmission.  

## 2018-04-08 ENCOUNTER — Telehealth: Payer: Self-pay | Admitting: Cardiology

## 2018-04-08 NOTE — Telephone Encounter (Signed)
Patient called and stated that she has not felt good since her gen change from ICD to PPM in 05/2017. Patient stated that she has not felt well and short of breath for a while now. Patient is due to see GT in 05/2018 but she is requesting to be seen sooner due to not feeling well. I instructed pt to send a remote transmission with her home monitor and that a Device Tech RN will review and talk w/ MD nurse. Pt verbalized understanding.

## 2018-04-09 DIAGNOSIS — E113299 Type 2 diabetes mellitus with mild nonproliferative diabetic retinopathy without macular edema, unspecified eye: Secondary | ICD-10-CM | POA: Diagnosis not present

## 2018-04-09 NOTE — Telephone Encounter (Signed)
I called to request that the pt send a manual transmission. The patient states she was not at home but will send the transmission when she get home.

## 2018-04-11 NOTE — Telephone Encounter (Signed)
Spoke w/ pt and she informed me that she wouldn't be able to send the transmission until Monday. She stated that she doesn't feel as bad as she did when she called. I informed pt that we will look for the transmission on Monday and if we do not receive the transmission on Monday we will assume she is doing better and if she needs [place] to call the office. Pt verbalized understanding.

## 2018-04-11 NOTE — Progress Notes (Signed)
No ICM remote transmission received for 04/01/2018 and next ICM transmission scheduled for 04/29/2018.

## 2018-04-18 ENCOUNTER — Encounter: Payer: Self-pay | Admitting: Cardiology

## 2018-04-25 DIAGNOSIS — J438 Other emphysema: Secondary | ICD-10-CM | POA: Diagnosis not present

## 2018-04-29 NOTE — Progress Notes (Signed)
No ICM remote transmission received for 04/29/2018.  Unable to reach patient for ICM monthly follow up and last remote transmission received was 12/2017.  Per protocol, device clinic will continue 91 day follow up.  Disenrolled from [facility] clinic.

## 2018-05-02 DIAGNOSIS — N390 Urinary tract infection, site not specified: Secondary | ICD-10-CM | POA: Diagnosis not present

## 2018-05-12 ENCOUNTER — Encounter: Payer: Medicare HMO | Admitting: *Deleted

## 2018-05-12 ENCOUNTER — Other Ambulatory Visit: Payer: Self-pay

## 2018-05-13 ENCOUNTER — Telehealth: Payer: Self-pay

## 2018-05-13 NOTE — Telephone Encounter (Signed)
Left message for patient to remind of missed remote transmission.  

## 2018-05-26 DIAGNOSIS — J309 Allergic rhinitis, unspecified: Secondary | ICD-10-CM | POA: Diagnosis not present

## 2018-05-26 DIAGNOSIS — J449 Chronic obstructive pulmonary disease, unspecified: Secondary | ICD-10-CM | POA: Diagnosis not present

## 2018-05-26 DIAGNOSIS — G894 Chronic pain syndrome: Secondary | ICD-10-CM | POA: Diagnosis not present

## 2018-05-26 DIAGNOSIS — J438 Other emphysema: Secondary | ICD-10-CM | POA: Diagnosis not present

## 2018-05-26 DIAGNOSIS — Z0000 Encounter for general adult medical examination without abnormal findings: Secondary | ICD-10-CM | POA: Diagnosis not present

## 2018-06-18 ENCOUNTER — Telehealth: Payer: Self-pay | Admitting: Internal Medicine

## 2018-06-18 NOTE — Telephone Encounter (Signed)
New message   Called pt about appt on 05.05.20 with Dr. Lovena Le. Pt was not interested in virtual visit, she preferred to have an in office visit. Informed pt that we would not be seeing pts in the office until august. Pt scheduled for 8.24

## 2018-06-24 ENCOUNTER — Encounter: Payer: Medicare HMO | Admitting: Internal Medicine

## 2018-06-25 DIAGNOSIS — J438 Other emphysema: Secondary | ICD-10-CM | POA: Diagnosis not present

## 2018-06-26 NOTE — Telephone Encounter (Signed)
Pt back in persistent AF with resultant decreased CRT pacing. Will route to Jenny/Dr Lovena Le for review. Tikosyn previously stopped, not a candidate for amiodarone, plan was for rate control per last note.   Chanetta Marshall, NP 06/26/2018 1:28 PM

## 2018-06-26 NOTE — Telephone Encounter (Signed)
Pt calling to see if the nurse or doctor looked at her transmission. I told her per [employer] phone note she is in persistent AF  With resultant decreased CRT pacing. I explained that Amber sent the phone note to Dr. Lovena Le to review. Pt states every time she stands she have to hold on to something to not fall. She states she feels like she going to pass out. The pt also states she do not want to go to the hospital but do wants the nurse or Dr. Lovena Le to call back Today if they can. She wants to know what it is she should do.

## 2018-06-26 NOTE — Telephone Encounter (Signed)
Pt states her blood pressure was up 3 days ago and feeling week. Pt feels like she been having episode. I help the pt send a transmission. Transmission received. The best number to call the pt is [phone].  The pt would like a nurse to call back as soon as she can.

## 2018-06-26 NOTE — Telephone Encounter (Signed)
Returned call to Pt.  Per Pt her blood pressures have been normal.  Last week they were too low, but this week they have been at highest 145/85 and her pulse is running in the 80's.  Pt states she is dizzy often and the only thing that relieves it is to sit.  Seems to get worse as the day goes on.  Doesn't know what is causing it.  Unsure if it is allergies or some type of respiratory illness.  It does not sound cardiac related to this nurse.  Pt has been in afib for a majority of the time since last year.  Per Pt's description her blood pressure and pulse have been normal.  I advised Pt call her PCP to see if she could be seen.  Made appointment for device check in office with Dr. Lovena Le June 4 to discuss if device can be optimized, should Pt have ECHO, is there any other tx for afib.  Pt will call if any issues between now and then.

## 2018-06-27 ENCOUNTER — Other Ambulatory Visit: Payer: Self-pay

## 2018-06-27 ENCOUNTER — Ambulatory Visit (INDEPENDENT_AMBULATORY_CARE_PROVIDER_SITE_OTHER): Payer: Medicare HMO | Admitting: *Deleted

## 2018-06-27 DIAGNOSIS — I480 Paroxysmal atrial fibrillation: Secondary | ICD-10-CM | POA: Diagnosis not present

## 2018-06-27 LAB — CUP PACEART REMOTE DEVICE CHECK
Date Time Interrogation Session: 20200508155251
Implantable Lead Implant Date: 20110127
Implantable Lead Implant Date: 20110127
Implantable Lead Implant Date: 20110127
Implantable Lead Location: 753858
Implantable Lead Location: 753859
Implantable Lead Location: 753860
Implantable Lead Model: 7122
Implantable Pulse Generator Implant Date: 20190418
Pulse Gen Model: 3222
Pulse Gen Serial Number: 9393517

## 2018-07-01 ENCOUNTER — Encounter: Payer: Self-pay | Admitting: Cardiology

## 2018-07-01 NOTE — Progress Notes (Signed)
Remote pacemaker transmission.   

## 2018-07-24 ENCOUNTER — Encounter: Payer: Medicare HMO | Admitting: Internal Medicine

## 2018-07-26 DIAGNOSIS — J438 Other emphysema: Secondary | ICD-10-CM | POA: Diagnosis not present

## 2018-07-28 DIAGNOSIS — Z961 Presence of intraocular lens: Secondary | ICD-10-CM | POA: Diagnosis not present

## 2018-07-28 DIAGNOSIS — H26492 Other secondary cataract, left eye: Secondary | ICD-10-CM | POA: Diagnosis not present

## 2018-07-28 DIAGNOSIS — H52203 Unspecified astigmatism, bilateral: Secondary | ICD-10-CM | POA: Diagnosis not present

## 2018-07-28 DIAGNOSIS — E113291 Type 2 diabetes mellitus with mild nonproliferative diabetic retinopathy without macular edema, right eye: Secondary | ICD-10-CM | POA: Diagnosis not present

## 2018-07-29 ENCOUNTER — Telehealth: Payer: Self-pay

## 2018-07-29 NOTE — Telephone Encounter (Signed)
Left message regarding appt on 07/30/18. 

## 2018-07-30 ENCOUNTER — Encounter: Payer: Medicare HMO | Admitting: Nurse Practitioner

## 2018-08-20 ENCOUNTER — Telehealth: Payer: Self-pay | Admitting: Internal Medicine

## 2018-08-20 NOTE — Telephone Encounter (Signed)
New Message         COVID-19 Pre-Screening Questions:   In the past 7 to 10 days have you had a cough,  shortness of breath, headache, congestion, fever (100 or greater) body aches, chills, sore throat, or sudden loss of taste or sense of smell? YES, says she has all of these symptoms due to her health, not because she is sick   Have you been around anyone with known Covid 19. NO  Have you been around anyone who is awaiting Covid 19 test results in the past 7 to 10 days? NO  Have you been around anyone who has been exposed to Covid 19, or has mentioned symptoms of Covid 19 within the past 7 to 10 days? NO  If you have any concerns/questions about symptoms patients report during screening (either on the phone or at threshold). Contact the provider seeing the patient or DOD for further guidance.  If neither are available contact a member of the leadership team.

## 2018-08-25 ENCOUNTER — Other Ambulatory Visit: Payer: Self-pay

## 2018-08-25 ENCOUNTER — Encounter: Payer: Self-pay | Admitting: Internal Medicine

## 2018-08-25 ENCOUNTER — Ambulatory Visit (INDEPENDENT_AMBULATORY_CARE_PROVIDER_SITE_OTHER): Payer: Medicare HMO | Admitting: Internal Medicine

## 2018-08-25 VITALS — BP 134/78 | HR 71 | Ht 66.5 in | Wt 210.8 lb

## 2018-08-25 DIAGNOSIS — Z95810 Presence of automatic (implantable) cardiac defibrillator: Secondary | ICD-10-CM | POA: Diagnosis not present

## 2018-08-25 DIAGNOSIS — J438 Other emphysema: Secondary | ICD-10-CM | POA: Diagnosis not present

## 2018-08-25 DIAGNOSIS — I5022 Chronic systolic (congestive) heart failure: Secondary | ICD-10-CM | POA: Diagnosis not present

## 2018-08-25 DIAGNOSIS — I480 Paroxysmal atrial fibrillation: Secondary | ICD-10-CM

## 2018-08-25 DIAGNOSIS — R69 Illness, unspecified: Secondary | ICD-10-CM | POA: Diagnosis not present

## 2018-08-25 LAB — CUP PACEART INCLINIC DEVICE CHECK
Battery Remaining Longevity: 103 mo
Battery Voltage: 3.01 V
Brady Statistic RA Percent Paced: 15 %
Brady Statistic RV Percent Paced: 83 %
Date Time Interrogation Session: 20200706132052
Implantable Lead Implant Date: 20110127
Implantable Lead Implant Date: 20110127
Implantable Lead Implant Date: 20110127
Implantable Lead Location: 753858
Implantable Lead Location: 753859
Implantable Lead Location: 753860
Implantable Lead Model: 7122
Implantable Pulse Generator Implant Date: 20190418
Lead Channel Impedance Value: 425 Ohm
Lead Channel Impedance Value: 525 Ohm
Lead Channel Impedance Value: 937.5 Ohm
Lead Channel Pacing Threshold Amplitude: 0.5 V
Lead Channel Pacing Threshold Amplitude: 1 V
Lead Channel Pacing Threshold Amplitude: 1 V
Lead Channel Pacing Threshold Pulse Width: 0.5 ms
Lead Channel Pacing Threshold Pulse Width: 0.5 ms
Lead Channel Pacing Threshold Pulse Width: 0.6 ms
Lead Channel Sensing Intrinsic Amplitude: 12 mV
Lead Channel Sensing Intrinsic Amplitude: 3 mV
Lead Channel Setting Pacing Amplitude: 2 V
Lead Channel Setting Pacing Amplitude: 2 V
Lead Channel Setting Pacing Amplitude: 2.5 V
Lead Channel Setting Pacing Pulse Width: 0.5 ms
Lead Channel Setting Pacing Pulse Width: 0.6 ms
Lead Channel Setting Sensing Sensitivity: 2 mV
Pulse Gen Model: 3222
Pulse Gen Serial Number: 9393517

## 2018-08-25 LAB — PROTIME-INR
INR: 2 — ABNORMAL HIGH (ref 0.8–1.2)
Prothrombin Time: 20.4 s — ABNORMAL HIGH (ref 9.1–12.0)

## 2018-08-25 NOTE — Patient Instructions (Signed)
Medication Instructions:  Your physician recommends that you continue on your current medications as directed. Please refer to the Current Medication list given to you today.  Labwork: None ordered.  Testing/Procedures: None ordered.  Follow-Up: Your physician wants you to follow-up in: one year with Dr. Lovena Le.   You will receive a reminder letter in the mail two months in advance. If you don't receive a letter, please call our office to schedule the follow-up appointment.  Remote monitoring is used to monitor your ICD from home. This monitoring reduces the number of office visits required to check your device to one time per year. It allows [place] to keep an eye on the functioning of your device to ensure it is working properly. You are scheduled for a device check from home on 09/29/2018. You may send your transmission at any time that day. If you have a wireless device, the transmission will be sent automatically. After your physician reviews your transmission, you will receive a postcard with your next transmission date.  Any Other Special Instructions Will Be Listed Below (If Applicable).  If you need a refill on your cardiac medications before your next appointment, please call your pharmacy.

## 2018-08-25 NOTE — Progress Notes (Signed)
HPI Emily Abbott returns today for followup of chronic systolic heart failure and LBBB, s/p BIV ICD insertion. She also has a h/o PAF which has become persistent and her dofetilide was stopped. In the interim, she had a single episode of COPD and was in the hospital for 4 days. She has not had palpitations. No syncope. No anginal symptoms.  Allergies  Allergen Reactions  . Adhesive [Tape] Other (See Comments)    Burning sensation  . Atorvastatin     Makes legs hurt   . Metformin And Related     "caused me kidney problems"   . Sulfonamide Derivatives Other (See Comments)    Unknown     Current Outpatient Medications  Medication Sig Dispense Refill  . acetaminophen (TYLENOL) 325 MG tablet Take 2 tablets (650 mg total) by mouth every 6 (six) hours as needed for mild pain or moderate pain.    Marland Kitchen albuterol (PROAIR HFA) 108 (90 Base) MCG/ACT inhaler Inhale 1-2 puffs into the lungs every 6 (six) hours as needed for wheezing or shortness of breath. 18 g 5  . ALPRAZolam (XANAX) 0.5 MG tablet Take 0.5 mg by mouth 2 (two) times daily as needed for anxiety.     Marland Kitchen amLODipine (NORVASC) 5 MG tablet Take 5 mg by mouth daily.    Marland Kitchen atorvastatin (LIPITOR) 10 MG tablet Take 5 mg by mouth daily.     . Azelastine HCl 137 MCG/SPRAY SOLN Place 2 sprays into both nostrils at bedtime.    . carvedilol (COREG) 6.25 MG tablet Take 6.25 mg by mouth 2 (two) times daily with a meal.    . diphenhydrAMINE (BENADRYL) 25 MG tablet Take 12.5 mg by mouth daily as needed for itching.    Marland Kitchen doxycycline (VIBRAMYCIN) 100 MG capsule Take 1 capsule (100 mg total) by mouth 2 (two) times daily. 8 capsule 0  . ezetimibe (ZETIA) 10 MG tablet Take 10 mg by mouth daily.    . furosemide (LASIX) 20 MG tablet TAKE 2 TABLETS BY MOUTH EVERY DAY (Patient taking differently: TAKE 40 MG BY MOUTH EVERY DAY) 180 tablet 2  . glimepiride (AMARYL) 1 MG tablet Take 1 mg by mouth daily.  5  . glipiZIDE (GLUCOTROL) 5 MG tablet Take 5 mg by mouth  every morning.  1  . HYDROcodone-acetaminophen (NORCO/VICODIN) 5-325 MG tablet Take 1 tablet by mouth at bedtime.     Marland Kitchen HYDROcodone-homatropine (HYCODAN) 5-1.5 MG/5ML syrup Take 5 mLs by mouth every 6 (six) hours as needed for cough. 120 mL 0  . ipratropium-albuterol (DUONEB) 0.5-2.5 (3) MG/3ML SOLN Take 3 mLs by nebulization every 6 (six) hours as needed. 360 mL 2  . linagliptin (TRADJENTA) 5 MG TABS tablet Take 5 mg by mouth daily.    . magnesium gluconate (MAGONATE) 500 MG tablet Take 500 mg by mouth daily as needed (for leg cramps).     Marland Kitchen omeprazole (PRILOSEC) 40 MG capsule Take 40 mg by mouth daily.      . OXYGEN Inhale 1.5 L into the lungs at bedtime.     Marland Kitchen PARoxetine (PAXIL) 30 MG tablet Take 30 mg by mouth daily.      . predniSONE (DELTASONE) 20 MG tablet Take 1 tablet (20 mg total) by mouth daily with breakfast. 5 tablet 0  . Suvorexant (BELSOMRA) 10 MG TABS Take 10 mg by mouth at bedtime as needed (for sleep).    Marland Kitchen tiZANidine (ZANAFLEX) 2 MG tablet Take 2 mg by mouth at bedtime  as needed for muscle spasms.     . TRELEGY ELLIPTA 100-62.5-25 MCG/INH AEPB Inhale 1 puff into the lungs daily.  12  . warfarin (COUMADIN) 5 MG tablet Take 2.5-5 mg by mouth See admin instructions. Take 5 mg by mouth daily on Sunday and Thursday. Take 2.5 mg by mouth daily on all other days     No current facility-administered medications for this visit.      Past Medical History:  Diagnosis Date  . Anemia   . Anxiety   . CAD (coronary artery disease)   . Chronic kidney disease    CKD stage 3   . COPD (chronic obstructive pulmonary disease) (HCC)    uses  1 to and1.5 L of o2 at bedtime ; has not seen her pulm doctor in over a year ; has not had to uses inhlaer in a over a year    . Diabetes mellitus    type II  . GERD (gastroesophageal reflux disease)   . HTN (hypertension)    essential  . Hyperlipidemia, mixed   . Mitral valve disorder   . Osteoarthritis   . Panic attack   . Paroxysmal atrial  fibrillation (HCC)   . Pneumonia    2010  . Pulmonary embolism ([place]) 2010  . Sleep apnea    does not uses cpap since she got a URI from it. has home 02 as needed    ROS:   All systems reviewed and negative except as noted in the HPI.   Past Surgical History:  Procedure Laterality Date  . BIOPSY BREAST    . BIV ICD GENERATOR CHANGEOUT N/A 06/06/2017   Procedure: BIV ICD GENERATOR CHANGEOUT;  Surgeon: Evans Lance, MD;  Location: [facility];  Service: Cardiovascular;  Laterality: N/A;  . CARDIAC VALVE SURGERY     mitral  . carpal tennel  2003  . EYE SURGERY  2015  or 2016   cataract surgery   . PARATHYROIDECTOMY Left 06/17/2017   Procedure: LEFT UPPER PARATHYROIDECTOMY;  Surgeon: Clovis Riley, MD;  Location: [facility];  Service: General;  Laterality: Left;  . TUBAL LIGATION      over 60 years ago      Family History  Problem Relation Age of Onset  . Stroke Mother   . Anuerysm Father   . Diabetes Sister   . Cancer Brother   . Ulcers Sister      Social History   Socioeconomic History  . Marital status: Widowed    Spouse name: Not on file  . Number of children: Not on file  . Years of education: Not on file  . Highest education level: Not on file  Occupational History  . Occupation: reitred  Social Needs  . Financial resource strain: Not on file  . Food insecurity    Worry: Not on file    Inability: Not on file  . Transportation needs    Medical: Not on file    Non-medical: Not on file  Tobacco Use  . Smoking status: Former Smoker    Packs/day: 1.00    Years: 50.00    Pack years: 50.00    Types: Cigarettes    Quit date: 02/19/2001    Years since quitting: 17.5  . Smokeless tobacco: Never Used  Substance and Sexual Activity  . Alcohol use: No    Alcohol/week: 0.0 standard drinks  . Drug use: Never  . Sexual activity: Not on file  Lifestyle  . Physical activity  Days per week: Not on file    Minutes per session: Not on file  . Stress:  Not on file  Relationships  . Social Herbalist on phone: Not on file    Gets together: Not on file    Attends religious service: Not on file    Active member of club or organization: Not on file    Attends meetings of clubs or organizations: Not on file    Relationship status: Not on file  . Intimate partner violence    Fear of current or ex partner: Not on file    Emotionally abused: Not on file    Physically abused: Not on file    Forced sexual activity: Not on file  Other Topics Concern  . Not on file  Social History Narrative  . Not on file     BP 134/78   Pulse 71   Ht 5' 6.5" (1.689 m)   Wt 210 lb 12.8 oz (95.6 kg)   SpO2 95%   BMI 33.51 kg/m   Physical Exam:  stable appearing 78 yo woman, NAD HEENT: Unremarkable Neck:  No JVD, no thyromegally Back:  No CVA tenderness Lungs:  Clear with no wheezes HEART:  Regular rate rhythm, no murmurs, no rubs, no clicks Abd:  soft, positive bowel sounds, no organomegally, no rebound, no guarding Ext:  2 plus pulses, no edema, no cyanosis, no clubbing Skin:  No rashes no nodules Neuro:  CN II through XII intact, motor grossly intact  EKG - AV paced  DEVICE  Normal device function.  See PaceArt for details.   Assess/Plan: 1. PAF - despite stopping dofetilide, she has maintained NSR much of the time. She is not a candidate for amiodarone due to her lung disease. She will undergo watchful waiting. 2. Biv PPM - her [employer] device is working normally. We will recheck in several months. 3. HTN - her bp is stable. We will follow. 4. Chronic systolic heart failure - her fluid index is good. She is encouraged to avoid salty food. She will continue her current meds.  Mikle Bosworth.D.

## 2018-08-27 DIAGNOSIS — H43393 Other vitreous opacities, bilateral: Secondary | ICD-10-CM | POA: Diagnosis not present

## 2018-08-27 DIAGNOSIS — H35013 Changes in retinal vascular appearance, bilateral: Secondary | ICD-10-CM | POA: Diagnosis not present

## 2018-08-27 DIAGNOSIS — Z961 Presence of intraocular lens: Secondary | ICD-10-CM | POA: Diagnosis not present

## 2018-08-27 DIAGNOSIS — H35033 Hypertensive retinopathy, bilateral: Secondary | ICD-10-CM | POA: Diagnosis not present

## 2018-08-27 DIAGNOSIS — E113293 Type 2 diabetes mellitus with mild nonproliferative diabetic retinopathy without macular edema, bilateral: Secondary | ICD-10-CM | POA: Diagnosis not present

## 2018-09-09 DIAGNOSIS — R69 Illness, unspecified: Secondary | ICD-10-CM | POA: Diagnosis not present

## 2018-09-25 DIAGNOSIS — J438 Other emphysema: Secondary | ICD-10-CM | POA: Diagnosis not present

## 2018-09-29 ENCOUNTER — Encounter: Payer: Medicare HMO | Admitting: *Deleted

## 2018-10-13 ENCOUNTER — Encounter: Payer: Medicare HMO | Admitting: Internal Medicine

## 2018-10-16 DIAGNOSIS — R69 Illness, unspecified: Secondary | ICD-10-CM | POA: Diagnosis not present

## 2018-10-26 DIAGNOSIS — J438 Other emphysema: Secondary | ICD-10-CM | POA: Diagnosis not present

## 2018-11-15 DIAGNOSIS — R69 Illness, unspecified: Secondary | ICD-10-CM | POA: Diagnosis not present

## 2018-11-25 DIAGNOSIS — J438 Other emphysema: Secondary | ICD-10-CM | POA: Diagnosis not present

## 2018-11-27 DIAGNOSIS — E1121 Type 2 diabetes mellitus with diabetic nephropathy: Secondary | ICD-10-CM | POA: Diagnosis not present

## 2018-11-27 DIAGNOSIS — D509 Iron deficiency anemia, unspecified: Secondary | ICD-10-CM | POA: Diagnosis not present

## 2018-11-27 DIAGNOSIS — E113299 Type 2 diabetes mellitus with mild nonproliferative diabetic retinopathy without macular edema, unspecified eye: Secondary | ICD-10-CM | POA: Diagnosis not present

## 2018-11-27 DIAGNOSIS — Z7901 Long term (current) use of anticoagulants: Secondary | ICD-10-CM | POA: Diagnosis not present

## 2018-11-27 DIAGNOSIS — E559 Vitamin D deficiency, unspecified: Secondary | ICD-10-CM | POA: Diagnosis not present

## 2018-11-27 DIAGNOSIS — N183 Chronic kidney disease, stage 3 unspecified: Secondary | ICD-10-CM | POA: Diagnosis not present

## 2018-12-15 DIAGNOSIS — R69 Illness, unspecified: Secondary | ICD-10-CM | POA: Diagnosis not present

## 2018-12-17 DIAGNOSIS — E1121 Type 2 diabetes mellitus with diabetic nephropathy: Secondary | ICD-10-CM | POA: Diagnosis not present

## 2018-12-17 DIAGNOSIS — M170 Bilateral primary osteoarthritis of knee: Secondary | ICD-10-CM | POA: Diagnosis not present

## 2018-12-17 DIAGNOSIS — D509 Iron deficiency anemia, unspecified: Secondary | ICD-10-CM | POA: Diagnosis not present

## 2018-12-17 DIAGNOSIS — E785 Hyperlipidemia, unspecified: Secondary | ICD-10-CM | POA: Diagnosis not present

## 2018-12-17 DIAGNOSIS — J449 Chronic obstructive pulmonary disease, unspecified: Secondary | ICD-10-CM | POA: Diagnosis not present

## 2018-12-17 DIAGNOSIS — I480 Paroxysmal atrial fibrillation: Secondary | ICD-10-CM | POA: Diagnosis not present

## 2018-12-17 DIAGNOSIS — I130 Hypertensive heart and chronic kidney disease with heart failure and stage 1 through stage 4 chronic kidney disease, or unspecified chronic kidney disease: Secondary | ICD-10-CM | POA: Diagnosis not present

## 2018-12-17 DIAGNOSIS — N183 Chronic kidney disease, stage 3 unspecified: Secondary | ICD-10-CM | POA: Diagnosis not present

## 2018-12-17 DIAGNOSIS — R69 Illness, unspecified: Secondary | ICD-10-CM | POA: Diagnosis not present

## 2018-12-26 DIAGNOSIS — J438 Other emphysema: Secondary | ICD-10-CM | POA: Diagnosis not present

## 2019-01-02 ENCOUNTER — Ambulatory Visit (INDEPENDENT_AMBULATORY_CARE_PROVIDER_SITE_OTHER): Payer: Medicare HMO | Admitting: *Deleted

## 2019-01-02 DIAGNOSIS — I5022 Chronic systolic (congestive) heart failure: Secondary | ICD-10-CM

## 2019-01-02 DIAGNOSIS — I480 Paroxysmal atrial fibrillation: Secondary | ICD-10-CM

## 2019-01-02 LAB — CUP PACEART REMOTE DEVICE CHECK
Battery Remaining Longevity: 100 mo
Battery Remaining Percentage: 95.5 %
Battery Voltage: 3.01 V
Brady Statistic AP VP Percent: 28 %
Brady Statistic AP VS Percent: 1 %
Brady Statistic AS VP Percent: 72 %
Brady Statistic AS VS Percent: 1 %
Brady Statistic RA Percent Paced: 18 %
Date Time Interrogation Session: 20201113072936
Implantable Lead Implant Date: 20110127
Implantable Lead Implant Date: 20110127
Implantable Lead Implant Date: 20110127
Implantable Lead Location: 753858
Implantable Lead Location: 753859
Implantable Lead Location: 753860
Implantable Lead Model: 7122
Implantable Pulse Generator Implant Date: 20190418
Lead Channel Impedance Value: 430 Ohm
Lead Channel Impedance Value: 540 Ohm
Lead Channel Impedance Value: 910 Ohm
Lead Channel Pacing Threshold Amplitude: 0.625 V
Lead Channel Pacing Threshold Amplitude: 1 V
Lead Channel Pacing Threshold Amplitude: 1 V
Lead Channel Pacing Threshold Pulse Width: 0.5 ms
Lead Channel Pacing Threshold Pulse Width: 0.5 ms
Lead Channel Pacing Threshold Pulse Width: 0.6 ms
Lead Channel Sensing Intrinsic Amplitude: 12 mV
Lead Channel Sensing Intrinsic Amplitude: 3.2 mV
Lead Channel Setting Pacing Amplitude: 2 V
Lead Channel Setting Pacing Amplitude: 2 V
Lead Channel Setting Pacing Amplitude: 2.5 V
Lead Channel Setting Pacing Pulse Width: 0.5 ms
Lead Channel Setting Pacing Pulse Width: 0.6 ms
Lead Channel Setting Sensing Sensitivity: 2 mV
Pulse Gen Model: 3222
Pulse Gen Serial Number: 9393517

## 2019-01-05 ENCOUNTER — Telehealth: Payer: Self-pay | Admitting: Student

## 2019-01-05 ENCOUNTER — Encounter: Payer: Self-pay | Admitting: Student

## 2019-01-05 NOTE — Telephone Encounter (Signed)
Device alert for ongoing persistent AF, burden 38% (Last check ~ 33%)  Was calling to assess symptoms.   LMOM for CB  [employer], [place]  01/05/2019 9:56 AM

## 2019-01-05 NOTE — Telephone Encounter (Signed)
Received call from pt. Call dropped while transferring lines.   Attempted CB x 2. Left additional HIPPA compliant message with CB number.

## 2019-01-05 NOTE — Telephone Encounter (Signed)
  Pt with increase in AF burden over the past month.   They deny symptoms. Previously taken off tikosyn. Not amio candidate due to lung disease.   Considered increase to their coreg (6.25 mg BID) but pt prefers to hold off and continue watchful waiting for now.   Will forward to Dr. Lovena Le as Juluis Rainier and for additional recommendation if needed. Otherwise, will continue watchful waiting via remotes. Pt knows to call back with any worsening symptoms.

## 2019-01-05 NOTE — Telephone Encounter (Signed)
Also, on coumadin.

## 2019-01-08 NOTE — Telephone Encounter (Signed)
Low CRT pacing and ongoing AF alerts programmed off. Will continue to monitor based on remote transmissions.

## 2019-01-12 DIAGNOSIS — R69 Illness, unspecified: Secondary | ICD-10-CM | POA: Diagnosis not present

## 2019-01-14 DIAGNOSIS — I480 Paroxysmal atrial fibrillation: Secondary | ICD-10-CM | POA: Diagnosis not present

## 2019-01-14 DIAGNOSIS — E785 Hyperlipidemia, unspecified: Secondary | ICD-10-CM | POA: Diagnosis not present

## 2019-01-14 DIAGNOSIS — M170 Bilateral primary osteoarthritis of knee: Secondary | ICD-10-CM | POA: Diagnosis not present

## 2019-01-14 DIAGNOSIS — I130 Hypertensive heart and chronic kidney disease with heart failure and stage 1 through stage 4 chronic kidney disease, or unspecified chronic kidney disease: Secondary | ICD-10-CM | POA: Diagnosis not present

## 2019-01-14 DIAGNOSIS — D509 Iron deficiency anemia, unspecified: Secondary | ICD-10-CM | POA: Diagnosis not present

## 2019-01-14 DIAGNOSIS — E1121 Type 2 diabetes mellitus with diabetic nephropathy: Secondary | ICD-10-CM | POA: Diagnosis not present

## 2019-01-14 DIAGNOSIS — J449 Chronic obstructive pulmonary disease, unspecified: Secondary | ICD-10-CM | POA: Diagnosis not present

## 2019-01-14 DIAGNOSIS — R69 Illness, unspecified: Secondary | ICD-10-CM | POA: Diagnosis not present

## 2019-01-14 DIAGNOSIS — N183 Chronic kidney disease, stage 3 unspecified: Secondary | ICD-10-CM | POA: Diagnosis not present

## 2019-01-14 NOTE — Telephone Encounter (Signed)
Only other option is AV node ablation. GT

## 2019-01-19 NOTE — Progress Notes (Signed)
Remote pacemaker transmission.   

## 2019-01-19 NOTE — Progress Notes (Signed)
0

## 2019-01-25 DIAGNOSIS — J438 Other emphysema: Secondary | ICD-10-CM | POA: Diagnosis not present

## 2019-02-12 DIAGNOSIS — R69 Illness, unspecified: Secondary | ICD-10-CM | POA: Diagnosis not present

## 2019-02-19 DIAGNOSIS — M79604 Pain in right leg: Secondary | ICD-10-CM | POA: Diagnosis not present

## 2019-02-19 DIAGNOSIS — E538 Deficiency of other specified B group vitamins: Secondary | ICD-10-CM | POA: Diagnosis not present

## 2019-02-19 DIAGNOSIS — Z7901 Long term (current) use of anticoagulants: Secondary | ICD-10-CM | POA: Diagnosis not present

## 2019-02-19 DIAGNOSIS — E1142 Type 2 diabetes mellitus with diabetic polyneuropathy: Secondary | ICD-10-CM | POA: Diagnosis not present

## 2019-02-19 DIAGNOSIS — Z23 Encounter for immunization: Secondary | ICD-10-CM | POA: Diagnosis not present

## 2019-02-19 DIAGNOSIS — I480 Paroxysmal atrial fibrillation: Secondary | ICD-10-CM | POA: Diagnosis not present

## 2019-02-25 DIAGNOSIS — J438 Other emphysema: Secondary | ICD-10-CM | POA: Diagnosis not present

## 2019-02-26 DIAGNOSIS — M170 Bilateral primary osteoarthritis of knee: Secondary | ICD-10-CM | POA: Diagnosis not present

## 2019-03-03 DIAGNOSIS — N183 Chronic kidney disease, stage 3 unspecified: Secondary | ICD-10-CM | POA: Diagnosis not present

## 2019-03-03 DIAGNOSIS — J449 Chronic obstructive pulmonary disease, unspecified: Secondary | ICD-10-CM | POA: Diagnosis not present

## 2019-03-03 DIAGNOSIS — D509 Iron deficiency anemia, unspecified: Secondary | ICD-10-CM | POA: Diagnosis not present

## 2019-03-03 DIAGNOSIS — E1121 Type 2 diabetes mellitus with diabetic nephropathy: Secondary | ICD-10-CM | POA: Diagnosis not present

## 2019-03-03 DIAGNOSIS — I130 Hypertensive heart and chronic kidney disease with heart failure and stage 1 through stage 4 chronic kidney disease, or unspecified chronic kidney disease: Secondary | ICD-10-CM | POA: Diagnosis not present

## 2019-03-03 DIAGNOSIS — M170 Bilateral primary osteoarthritis of knee: Secondary | ICD-10-CM | POA: Diagnosis not present

## 2019-03-03 DIAGNOSIS — E1142 Type 2 diabetes mellitus with diabetic polyneuropathy: Secondary | ICD-10-CM | POA: Diagnosis not present

## 2019-03-03 DIAGNOSIS — R69 Illness, unspecified: Secondary | ICD-10-CM | POA: Diagnosis not present

## 2019-03-03 DIAGNOSIS — E785 Hyperlipidemia, unspecified: Secondary | ICD-10-CM | POA: Diagnosis not present

## 2019-03-03 DIAGNOSIS — I480 Paroxysmal atrial fibrillation: Secondary | ICD-10-CM | POA: Diagnosis not present

## 2019-03-10 ENCOUNTER — Telehealth: Payer: Self-pay

## 2019-03-10 NOTE — Telephone Encounter (Signed)
I called the pt because she left me a message that his ppm made a sound. I was going to ask him to send a manual transmission with his home monitor. LMOVM to call my direct office number.

## 2019-03-11 NOTE — Telephone Encounter (Signed)
Transmission received.

## 2019-03-11 NOTE — Telephone Encounter (Signed)
The pt states she is going to send a transmission with her home monitor. I told her when I see the transmission I will have the nurse review it and give her a call back.

## 2019-03-11 NOTE — Telephone Encounter (Signed)
Spoke with patient. Advised no alerts. Explained alert beeping will occur every 10 hours if an alert condition is met. Pt verbalizes understanding and denies additional questions at this time.

## 2019-03-11 NOTE — Telephone Encounter (Signed)
LMOVM requesting call back to DC. Direct number provided.  Transmission received 03/11/19 reveals no new alerts or abnormalities.

## 2019-03-12 DIAGNOSIS — R69 Illness, unspecified: Secondary | ICD-10-CM | POA: Diagnosis not present

## 2019-03-24 DIAGNOSIS — R69 Illness, unspecified: Secondary | ICD-10-CM | POA: Diagnosis not present

## 2019-03-25 DIAGNOSIS — Z7901 Long term (current) use of anticoagulants: Secondary | ICD-10-CM | POA: Diagnosis not present

## 2019-03-28 DIAGNOSIS — J438 Other emphysema: Secondary | ICD-10-CM | POA: Diagnosis not present

## 2019-04-03 ENCOUNTER — Ambulatory Visit (INDEPENDENT_AMBULATORY_CARE_PROVIDER_SITE_OTHER): Payer: Medicare HMO | Admitting: *Deleted

## 2019-04-03 DIAGNOSIS — I5022 Chronic systolic (congestive) heart failure: Secondary | ICD-10-CM

## 2019-04-06 DIAGNOSIS — M170 Bilateral primary osteoarthritis of knee: Secondary | ICD-10-CM | POA: Diagnosis not present

## 2019-04-07 LAB — CUP PACEART REMOTE DEVICE CHECK
Battery Remaining Longevity: 101 mo
Battery Remaining Percentage: 95.5 %
Battery Voltage: 2.99 V
Brady Statistic AP VP Percent: 26 %
Brady Statistic AP VS Percent: 1 %
Brady Statistic AS VP Percent: 74 %
Brady Statistic AS VS Percent: 1 %
Brady Statistic RA Percent Paced: 12 %
Date Time Interrogation Session: 20210215130935
Implantable Lead Implant Date: 20110127
Implantable Lead Implant Date: 20110127
Implantable Lead Implant Date: 20110127
Implantable Lead Location: 753858
Implantable Lead Location: 753859
Implantable Lead Location: 753860
Implantable Lead Model: 7122
Implantable Pulse Generator Implant Date: 20190418
Lead Channel Impedance Value: 1075 Ohm
Lead Channel Impedance Value: 440 Ohm
Lead Channel Impedance Value: 590 Ohm
Lead Channel Pacing Threshold Amplitude: 0.5 V
Lead Channel Pacing Threshold Amplitude: 1 V
Lead Channel Pacing Threshold Amplitude: 1 V
Lead Channel Pacing Threshold Pulse Width: 0.5 ms
Lead Channel Pacing Threshold Pulse Width: 0.5 ms
Lead Channel Pacing Threshold Pulse Width: 0.6 ms
Lead Channel Sensing Intrinsic Amplitude: 12 mV
Lead Channel Sensing Intrinsic Amplitude: 2.9 mV
Lead Channel Setting Pacing Amplitude: 2 V
Lead Channel Setting Pacing Amplitude: 2 V
Lead Channel Setting Pacing Amplitude: 2.5 V
Lead Channel Setting Pacing Pulse Width: 0.5 ms
Lead Channel Setting Pacing Pulse Width: 0.6 ms
Lead Channel Setting Sensing Sensitivity: 2 mV
Pulse Gen Model: 3222
Pulse Gen Serial Number: 9393517

## 2019-04-07 NOTE — Progress Notes (Signed)
PPM Remote  

## 2019-04-13 DIAGNOSIS — M170 Bilateral primary osteoarthritis of knee: Secondary | ICD-10-CM | POA: Diagnosis not present

## 2019-04-16 DIAGNOSIS — I480 Paroxysmal atrial fibrillation: Secondary | ICD-10-CM | POA: Diagnosis not present

## 2019-04-16 DIAGNOSIS — E1142 Type 2 diabetes mellitus with diabetic polyneuropathy: Secondary | ICD-10-CM | POA: Diagnosis not present

## 2019-04-16 DIAGNOSIS — E785 Hyperlipidemia, unspecified: Secondary | ICD-10-CM | POA: Diagnosis not present

## 2019-04-16 DIAGNOSIS — E1121 Type 2 diabetes mellitus with diabetic nephropathy: Secondary | ICD-10-CM | POA: Diagnosis not present

## 2019-04-16 DIAGNOSIS — M170 Bilateral primary osteoarthritis of knee: Secondary | ICD-10-CM | POA: Diagnosis not present

## 2019-04-16 DIAGNOSIS — R69 Illness, unspecified: Secondary | ICD-10-CM | POA: Diagnosis not present

## 2019-04-16 DIAGNOSIS — D509 Iron deficiency anemia, unspecified: Secondary | ICD-10-CM | POA: Diagnosis not present

## 2019-04-16 DIAGNOSIS — J449 Chronic obstructive pulmonary disease, unspecified: Secondary | ICD-10-CM | POA: Diagnosis not present

## 2019-04-16 DIAGNOSIS — I130 Hypertensive heart and chronic kidney disease with heart failure and stage 1 through stage 4 chronic kidney disease, or unspecified chronic kidney disease: Secondary | ICD-10-CM | POA: Diagnosis not present

## 2019-04-16 DIAGNOSIS — N183 Chronic kidney disease, stage 3 unspecified: Secondary | ICD-10-CM | POA: Diagnosis not present

## 2019-04-20 DIAGNOSIS — M170 Bilateral primary osteoarthritis of knee: Secondary | ICD-10-CM | POA: Diagnosis not present

## 2019-04-23 DIAGNOSIS — Z961 Presence of intraocular lens: Secondary | ICD-10-CM | POA: Diagnosis not present

## 2019-04-23 DIAGNOSIS — H25812 Combined forms of age-related cataract, left eye: Secondary | ICD-10-CM | POA: Diagnosis not present

## 2019-04-23 DIAGNOSIS — H43393 Other vitreous opacities, bilateral: Secondary | ICD-10-CM | POA: Diagnosis not present

## 2019-04-23 DIAGNOSIS — H35013 Changes in retinal vascular appearance, bilateral: Secondary | ICD-10-CM | POA: Diagnosis not present

## 2019-04-23 DIAGNOSIS — H35033 Hypertensive retinopathy, bilateral: Secondary | ICD-10-CM | POA: Diagnosis not present

## 2019-04-23 DIAGNOSIS — E113293 Type 2 diabetes mellitus with mild nonproliferative diabetic retinopathy without macular edema, bilateral: Secondary | ICD-10-CM | POA: Diagnosis not present

## 2019-04-25 DIAGNOSIS — J438 Other emphysema: Secondary | ICD-10-CM | POA: Diagnosis not present

## 2019-05-21 DIAGNOSIS — Z7984 Long term (current) use of oral hypoglycemic drugs: Secondary | ICD-10-CM | POA: Diagnosis not present

## 2019-05-21 DIAGNOSIS — E1165 Type 2 diabetes mellitus with hyperglycemia: Secondary | ICD-10-CM | POA: Diagnosis not present

## 2019-05-21 DIAGNOSIS — J441 Chronic obstructive pulmonary disease with (acute) exacerbation: Secondary | ICD-10-CM | POA: Diagnosis not present

## 2019-05-21 DIAGNOSIS — N183 Chronic kidney disease, stage 3 unspecified: Secondary | ICD-10-CM | POA: Diagnosis not present

## 2019-05-21 DIAGNOSIS — Z7901 Long term (current) use of anticoagulants: Secondary | ICD-10-CM | POA: Diagnosis not present

## 2019-05-26 DIAGNOSIS — J438 Other emphysema: Secondary | ICD-10-CM | POA: Diagnosis not present

## 2019-06-08 DIAGNOSIS — J9611 Chronic respiratory failure with hypoxia: Secondary | ICD-10-CM | POA: Diagnosis not present

## 2019-06-08 DIAGNOSIS — E1121 Type 2 diabetes mellitus with diabetic nephropathy: Secondary | ICD-10-CM | POA: Diagnosis not present

## 2019-06-08 DIAGNOSIS — J0110 Acute frontal sinusitis, unspecified: Secondary | ICD-10-CM | POA: Diagnosis not present

## 2019-06-08 DIAGNOSIS — M5136 Other intervertebral disc degeneration, lumbar region: Secondary | ICD-10-CM | POA: Diagnosis not present

## 2019-06-08 DIAGNOSIS — J449 Chronic obstructive pulmonary disease, unspecified: Secondary | ICD-10-CM | POA: Diagnosis not present

## 2019-06-08 DIAGNOSIS — Z7901 Long term (current) use of anticoagulants: Secondary | ICD-10-CM | POA: Diagnosis not present

## 2019-06-18 DIAGNOSIS — E785 Hyperlipidemia, unspecified: Secondary | ICD-10-CM | POA: Diagnosis not present

## 2019-06-18 DIAGNOSIS — E1121 Type 2 diabetes mellitus with diabetic nephropathy: Secondary | ICD-10-CM | POA: Diagnosis not present

## 2019-06-18 DIAGNOSIS — D509 Iron deficiency anemia, unspecified: Secondary | ICD-10-CM | POA: Diagnosis not present

## 2019-06-18 DIAGNOSIS — E1142 Type 2 diabetes mellitus with diabetic polyneuropathy: Secondary | ICD-10-CM | POA: Diagnosis not present

## 2019-06-18 DIAGNOSIS — J441 Chronic obstructive pulmonary disease with (acute) exacerbation: Secondary | ICD-10-CM | POA: Diagnosis not present

## 2019-06-18 DIAGNOSIS — R69 Illness, unspecified: Secondary | ICD-10-CM | POA: Diagnosis not present

## 2019-06-18 DIAGNOSIS — J449 Chronic obstructive pulmonary disease, unspecified: Secondary | ICD-10-CM | POA: Diagnosis not present

## 2019-06-18 DIAGNOSIS — M170 Bilateral primary osteoarthritis of knee: Secondary | ICD-10-CM | POA: Diagnosis not present

## 2019-06-18 DIAGNOSIS — I130 Hypertensive heart and chronic kidney disease with heart failure and stage 1 through stage 4 chronic kidney disease, or unspecified chronic kidney disease: Secondary | ICD-10-CM | POA: Diagnosis not present

## 2019-06-18 DIAGNOSIS — I480 Paroxysmal atrial fibrillation: Secondary | ICD-10-CM | POA: Diagnosis not present

## 2019-06-23 DIAGNOSIS — Z7901 Long term (current) use of anticoagulants: Secondary | ICD-10-CM | POA: Diagnosis not present

## 2019-06-23 DIAGNOSIS — J441 Chronic obstructive pulmonary disease with (acute) exacerbation: Secondary | ICD-10-CM | POA: Diagnosis not present

## 2019-06-25 DIAGNOSIS — J438 Other emphysema: Secondary | ICD-10-CM | POA: Diagnosis not present

## 2019-06-26 DIAGNOSIS — Z7901 Long term (current) use of anticoagulants: Secondary | ICD-10-CM | POA: Diagnosis not present

## 2019-06-26 DIAGNOSIS — D72829 Elevated white blood cell count, unspecified: Secondary | ICD-10-CM | POA: Diagnosis not present

## 2019-06-26 DIAGNOSIS — I480 Paroxysmal atrial fibrillation: Secondary | ICD-10-CM | POA: Diagnosis not present

## 2019-07-03 ENCOUNTER — Ambulatory Visit (INDEPENDENT_AMBULATORY_CARE_PROVIDER_SITE_OTHER): Payer: Medicare HMO | Admitting: *Deleted

## 2019-07-03 DIAGNOSIS — I428 Other cardiomyopathies: Secondary | ICD-10-CM

## 2019-07-03 LAB — CUP PACEART REMOTE DEVICE CHECK
Battery Remaining Longevity: 101 mo
Battery Remaining Percentage: 95.5 %
Battery Voltage: 2.99 V
Brady Statistic AP VP Percent: 26 %
Brady Statistic AP VS Percent: 1 %
Brady Statistic AS VP Percent: 73 %
Brady Statistic AS VS Percent: 1 %
Brady Statistic RA Percent Paced: 15 %
Date Time Interrogation Session: 20210514084951
Implantable Lead Implant Date: 20110127
Implantable Lead Implant Date: 20110127
Implantable Lead Implant Date: 20110127
Implantable Lead Location: 753858
Implantable Lead Location: 753859
Implantable Lead Location: 753860
Implantable Lead Model: 7122
Implantable Pulse Generator Implant Date: 20190418
Lead Channel Impedance Value: 1000 Ohm
Lead Channel Impedance Value: 410 Ohm
Lead Channel Impedance Value: 560 Ohm
Lead Channel Pacing Threshold Amplitude: 0.75 V
Lead Channel Pacing Threshold Amplitude: 1 V
Lead Channel Pacing Threshold Amplitude: 1 V
Lead Channel Pacing Threshold Pulse Width: 0.5 ms
Lead Channel Pacing Threshold Pulse Width: 0.5 ms
Lead Channel Pacing Threshold Pulse Width: 0.6 ms
Lead Channel Sensing Intrinsic Amplitude: 12 mV
Lead Channel Sensing Intrinsic Amplitude: 2 mV
Lead Channel Setting Pacing Amplitude: 2 V
Lead Channel Setting Pacing Amplitude: 2 V
Lead Channel Setting Pacing Amplitude: 2.5 V
Lead Channel Setting Pacing Pulse Width: 0.5 ms
Lead Channel Setting Pacing Pulse Width: 0.6 ms
Lead Channel Setting Sensing Sensitivity: 2 mV
Pulse Gen Model: 3222
Pulse Gen Serial Number: 9393517

## 2019-07-06 NOTE — Progress Notes (Signed)
Remote pacemaker transmission.   

## 2019-07-07 DIAGNOSIS — J301 Allergic rhinitis due to pollen: Secondary | ICD-10-CM | POA: Diagnosis not present

## 2019-07-07 DIAGNOSIS — J449 Chronic obstructive pulmonary disease, unspecified: Secondary | ICD-10-CM | POA: Diagnosis not present

## 2019-07-07 DIAGNOSIS — I10 Essential (primary) hypertension: Secondary | ICD-10-CM | POA: Diagnosis not present

## 2019-07-07 DIAGNOSIS — E559 Vitamin D deficiency, unspecified: Secondary | ICD-10-CM | POA: Diagnosis not present

## 2019-07-07 DIAGNOSIS — J309 Allergic rhinitis, unspecified: Secondary | ICD-10-CM | POA: Diagnosis not present

## 2019-07-07 DIAGNOSIS — I480 Paroxysmal atrial fibrillation: Secondary | ICD-10-CM | POA: Diagnosis not present

## 2019-07-07 DIAGNOSIS — L309 Dermatitis, unspecified: Secondary | ICD-10-CM | POA: Diagnosis not present

## 2019-07-07 DIAGNOSIS — E1121 Type 2 diabetes mellitus with diabetic nephropathy: Secondary | ICD-10-CM | POA: Diagnosis not present

## 2019-07-07 DIAGNOSIS — E113299 Type 2 diabetes mellitus with mild nonproliferative diabetic retinopathy without macular edema, unspecified eye: Secondary | ICD-10-CM | POA: Diagnosis not present

## 2019-07-07 DIAGNOSIS — M5136 Other intervertebral disc degeneration, lumbar region: Secondary | ICD-10-CM | POA: Diagnosis not present

## 2019-07-14 DIAGNOSIS — E785 Hyperlipidemia, unspecified: Secondary | ICD-10-CM | POA: Diagnosis not present

## 2019-07-14 DIAGNOSIS — D509 Iron deficiency anemia, unspecified: Secondary | ICD-10-CM | POA: Diagnosis not present

## 2019-07-14 DIAGNOSIS — J441 Chronic obstructive pulmonary disease with (acute) exacerbation: Secondary | ICD-10-CM | POA: Diagnosis not present

## 2019-07-14 DIAGNOSIS — M170 Bilateral primary osteoarthritis of knee: Secondary | ICD-10-CM | POA: Diagnosis not present

## 2019-07-14 DIAGNOSIS — I480 Paroxysmal atrial fibrillation: Secondary | ICD-10-CM | POA: Diagnosis not present

## 2019-07-14 DIAGNOSIS — E1121 Type 2 diabetes mellitus with diabetic nephropathy: Secondary | ICD-10-CM | POA: Diagnosis not present

## 2019-07-14 DIAGNOSIS — I130 Hypertensive heart and chronic kidney disease with heart failure and stage 1 through stage 4 chronic kidney disease, or unspecified chronic kidney disease: Secondary | ICD-10-CM | POA: Diagnosis not present

## 2019-07-14 DIAGNOSIS — I10 Essential (primary) hypertension: Secondary | ICD-10-CM | POA: Diagnosis not present

## 2019-07-14 DIAGNOSIS — J449 Chronic obstructive pulmonary disease, unspecified: Secondary | ICD-10-CM | POA: Diagnosis not present

## 2019-07-14 DIAGNOSIS — E1142 Type 2 diabetes mellitus with diabetic polyneuropathy: Secondary | ICD-10-CM | POA: Diagnosis not present

## 2019-07-17 ENCOUNTER — Telehealth: Payer: Self-pay

## 2019-07-17 ENCOUNTER — Emergency Department (HOSPITAL_COMMUNITY)
Admission: EM | Admit: 2019-07-17 | Discharge: 2019-07-17 | Disposition: A | Discharge status: Home / Self Care | Payer: Medicare HMO | Attending: Emergency Medicine | Admitting: Emergency Medicine

## 2019-07-17 ENCOUNTER — Encounter (HOSPITAL_COMMUNITY): Payer: Self-pay | Admitting: Emergency Medicine

## 2019-07-17 ENCOUNTER — Other Ambulatory Visit: Payer: Self-pay

## 2019-07-17 ENCOUNTER — Emergency Department (HOSPITAL_COMMUNITY): Payer: Medicare HMO

## 2019-07-17 DIAGNOSIS — I130 Hypertensive heart and chronic kidney disease with heart failure and stage 1 through stage 4 chronic kidney disease, or unspecified chronic kidney disease: Secondary | ICD-10-CM | POA: Insufficient documentation

## 2019-07-17 DIAGNOSIS — R0789 Other chest pain: Secondary | ICD-10-CM | POA: Insufficient documentation

## 2019-07-17 DIAGNOSIS — Z79899 Other long term (current) drug therapy: Secondary | ICD-10-CM | POA: Insufficient documentation

## 2019-07-17 DIAGNOSIS — R079 Chest pain, unspecified: Secondary | ICD-10-CM | POA: Diagnosis not present

## 2019-07-17 DIAGNOSIS — N183 Chronic kidney disease, stage 3 unspecified: Secondary | ICD-10-CM | POA: Diagnosis not present

## 2019-07-17 DIAGNOSIS — I5022 Chronic systolic (congestive) heart failure: Secondary | ICD-10-CM | POA: Diagnosis not present

## 2019-07-17 DIAGNOSIS — E1122 Type 2 diabetes mellitus with diabetic chronic kidney disease: Secondary | ICD-10-CM | POA: Diagnosis not present

## 2019-07-17 DIAGNOSIS — J449 Chronic obstructive pulmonary disease, unspecified: Secondary | ICD-10-CM | POA: Insufficient documentation

## 2019-07-17 DIAGNOSIS — Z87891 Personal history of nicotine dependence: Secondary | ICD-10-CM | POA: Insufficient documentation

## 2019-07-17 DIAGNOSIS — Z794 Long term (current) use of insulin: Secondary | ICD-10-CM | POA: Insufficient documentation

## 2019-07-17 DIAGNOSIS — I2510 Atherosclerotic heart disease of native coronary artery without angina pectoris: Secondary | ICD-10-CM | POA: Insufficient documentation

## 2019-07-17 DIAGNOSIS — Z95810 Presence of automatic (implantable) cardiac defibrillator: Secondary | ICD-10-CM | POA: Diagnosis not present

## 2019-07-17 DIAGNOSIS — Z7901 Long term (current) use of anticoagulants: Secondary | ICD-10-CM | POA: Diagnosis not present

## 2019-07-17 LAB — CBC
HCT: 38.4 % (ref 36.0–46.0)
Hemoglobin: 12.3 g/dL (ref 12.0–15.0)
MCH: 27.2 pg (ref 26.0–34.0)
MCHC: 32 g/dL (ref 30.0–36.0)
MCV: 84.8 fL (ref 80.0–100.0)
Platelets: 280 10*3/uL (ref 150–400)
RBC: 4.53 MIL/uL (ref 3.87–5.11)
RDW: 14.3 % (ref 11.5–15.5)
WBC: 13.5 10*3/uL — ABNORMAL HIGH (ref 4.0–10.5)
nRBC: 0 % (ref 0.0–0.2)

## 2019-07-17 LAB — PROTIME-INR
INR: 1.9 — ABNORMAL HIGH (ref 0.8–1.2)
Prothrombin Time: 20.7 seconds — ABNORMAL HIGH (ref 11.4–15.2)

## 2019-07-17 LAB — BASIC METABOLIC PANEL
Anion gap: 10 (ref 5–15)
BUN: 16 mg/dL (ref 8–23)
CO2: 24 mmol/L (ref 22–32)
Calcium: 9.2 mg/dL (ref 8.9–10.3)
Chloride: 100 mmol/L (ref 98–111)
Creatinine, Ser: 1.28 mg/dL — ABNORMAL HIGH (ref 0.44–1.00)
GFR calc Af Amer: 46 mL/min — ABNORMAL LOW (ref >60)
GFR calc non Af Amer: 39 mL/min — ABNORMAL LOW (ref >60)
Glucose, Bld: 306 mg/dL — ABNORMAL HIGH (ref 70–99)
Potassium: 4.1 mmol/L (ref 3.5–5.1)
Sodium: 134 mmol/L — ABNORMAL LOW (ref 135–145)

## 2019-07-17 LAB — TROPONIN I (HIGH SENSITIVITY)
Troponin I (High Sensitivity): 10 ng/L (ref <18)
Troponin I (High Sensitivity): 8 ng/L (ref <18)

## 2019-07-17 MED ORDER — SODIUM CHLORIDE 0.9% FLUSH: 3.0000 mL | Freq: Once | INTRAVENOUS | Status: DC

## 2019-07-17 MED ORDER — ALUM & MAG HYDROXIDE-SIMETH 200-200-20 MG/5ML PO SUSP
30.0000 mL | Freq: Once | ORAL | Status: AC
  Administered 2019-07-17: 30 mL via ORAL
  Filled 2019-07-17: qty 30

## 2019-07-17 NOTE — ED Notes (Signed)
Pt in a hurry to leave. Pt left without getting another set of vitals but did receive discharge papers from MD.

## 2019-07-17 NOTE — Discharge Instructions (Signed)
Please follow up with your cardiologist within 5-7 days for re-evaluation of your symptoms.   Please return to the emergency department for any new or worsening symptoms.  

## 2019-07-17 NOTE — ED Provider Notes (Signed)
[facility] EMERGENCY DEPARTMENT Provider Note   CSN: [id number] Arrival date & time: 07/17/19  1101     History Chief Complaint  Patient presents with  . Chest Pain  . Atrial Fibrillation    Emily Abbott is a 79 y.o. adult.  HPI   Pt is an 79 y/o female with a h/o anemia, anxiety, CAD, CKD, DM, GERD, HTN, HLD, mitral valve disorder, OA, pAfib, PE, OSA, who presents to the ED today c/o LUQ abd/left chest pain that started two days ago. Pain has been intermittent. Pain is not exacerbated by food. Pain is worse when laying on her left side. Pain is not necessarily worse with exertion. Pain feels sharp/stabbing and "like my heart is going to do a flip". Pain rated 9/10 this AM. She has chronic SOB that she feels like has worsened over the last month. Denies worsening of her chronic cough. Denies BLE swelling. She reports associated nausea, but denies vomiting. Denies diarrhea, but has had some constipation which is improving. Denies blood stools.   She called her cardiologist who noted she has been in Afib for the last 2 days. She was advised to come to the ED for further w/u of her chest pain.  Past Medical History:  Diagnosis Date  . Anemia   . Anxiety   . CAD (coronary artery disease)   . Chronic kidney disease    CKD stage 3   . COPD (chronic obstructive pulmonary disease) (HCC)    uses  1 to and1.5 L of o2 at bedtime ; has not seen her pulm doctor in over a year ; has not had to uses inhlaer in a over a year    . Diabetes mellitus    type II  . GERD (gastroesophageal reflux disease)   . HTN (hypertension)    essential  . Hyperlipidemia, mixed   . Mitral valve disorder   . Osteoarthritis   . Panic attack   . Paroxysmal atrial fibrillation (HCC)   . Pneumonia    2010  . Pulmonary embolism ([place]) 2010  . Sleep apnea    does not uses cpap since she got a URI from it. has home 02 as needed    Patient Active Problem List   Diagnosis Date Noted  . COPD  exacerbation ([place]) 02/13/2018  . Acute on chronic respiratory failure with hypoxia ([place]) 02/13/2018  . Hyperparathyroidism ([place]) 06/17/2017  . Obstructive sleep apnea 02/22/2015  . Lung nodule, solitary 11/24/2014  . Dyspnea and respiratory abnormality 11/10/2014  . Hoarseness of voice 11/10/2014  . Smoking history 11/10/2014  . History of pulmonary embolism 11/10/2014  . Biventricular implantable cardioverter-defibrillator in situ 07/10/2011  . ORTHOSTATIC DIZZINESS 10/10/2009  . Chronic systolic heart failure ([place]) 11/16/2008  . DM2 (diabetes mellitus, type 2) ([place]) 11/12/2008  . HYPERLIPIDEMIA, MIXED 11/12/2008  . ANEMIA 11/12/2008  . ANXIETY 11/12/2008  . PANIC ATTACK 11/12/2008  . Essential hypertension 11/12/2008  . Coronary atherosclerosis 11/12/2008  . MITRAL VALVE DISORDER 11/12/2008  . PAROXYSMAL ATRIAL FIBRILLATION 11/12/2008  . COPD (chronic obstructive pulmonary disease) ([place]) 11/12/2008  . GERD 11/12/2008  . OSTEOARTHRITIS 11/12/2008    Past Surgical History:  Procedure Laterality Date  . BIOPSY BREAST    . BIV ICD GENERATOR CHANGEOUT N/A 06/06/2017   Procedure: BIV ICD GENERATOR CHANGEOUT;  Surgeon: Evans Lance, MD;  Location: [facility];  Service: Cardiovascular;  Laterality: N/A;  . CARDIAC VALVE SURGERY     mitral  .  carpal tennel  2003  . EYE SURGERY  2015  or 2016   cataract surgery   . PARATHYROIDECTOMY Left 06/17/2017   Procedure: LEFT UPPER PARATHYROIDECTOMY;  Surgeon: Clovis Riley, MD;  Location: [facility];  Service: General;  Laterality: Left;  . TUBAL LIGATION      over 60 years ago      OB History   No obstetric history on file.     Family History  Problem Relation Age of Onset  . Stroke Mother   . Anuerysm Father   . Diabetes Sister   . Cancer Brother   . Ulcers Sister     Social History   Tobacco Use  . Smoking status: Former Smoker    Packs/day: 1.00    Years: 50.00    Pack years: 50.00    Types: Cigarettes     Quit date: 02/19/2001    Years since quitting: 18.4  . Smokeless tobacco: Never Used  Substance Use Topics  . Alcohol use: No    Alcohol/week: 0.0 standard drinks  . Drug use: Never    Home Medications Prior to Admission medications   Medication Sig Start Date End Date Taking? Authorizing Provider  acetaminophen (TYLENOL) 325 MG tablet Take 2 tablets (650 mg total) by mouth every 6 (six) hours as needed for mild pain or moderate pain. 06/18/17  Yes Clovis Riley, MD  albuterol [facility] HFA) 108 (90 Base) MCG/ACT inhaler Inhale 1-2 puffs into the lungs every 6 (six) hours as needed for wheezing or shortness of breath. 08/12/15  Yes Brand Males, MD  ALPRAZolam Duanne Moron) 0.5 MG tablet Take 0.5 mg by mouth 2 (two) times daily as needed for anxiety or sleep.    Yes [provider]  amLODipine (NORVASC) 5 MG tablet Take 5 mg by mouth daily.   Yes [provider]  Azelastine HCl 137 MCG/SPRAY SOLN Place 2 sprays into both nostrils at bedtime.   Yes [provider]  calcium carbonate (TUMS EX) 750 MG chewable tablet Chew 2 tablets by mouth as needed for heartburn.   Yes [provider]  carvedilol (COREG) 6.25 MG tablet Take 6.25 mg by mouth 2 (two) times daily with a meal.   Yes [provider]  cholecalciferol (VITAMIN D3) 25 MCG (1000 UNIT) tablet Take 1,000 Units by mouth daily.   Yes [provider]  diphenhydrAMINE (BENADRYL) 25 MG tablet Take 12.5 mg by mouth daily as needed for itching.   Yes [provider]  ezetimibe (ZETIA) 10 MG tablet Take 10 mg by mouth daily.   Yes [provider]  furosemide (LASIX) 40 MG tablet Take 40 mg by mouth daily. 04/27/19  Yes [provider]  HYDROcodone-acetaminophen (NORCO/VICODIN) 5-325 MG tablet Take 0.5-1 tablets by mouth at bedtime.    Yes [provider]  Insulin Glargine (BASAGLAR KWIKPEN) 100 UNIT/ML Inject 14 Units into the skin daily. 07/15/19  Yes [provider]  ipratropium-albuterol (DUONEB) 0.5-2.5 (3) MG/3ML SOLN Take 3 mLs by nebulization every 6 (six) hours as needed. 02/16/18  Yes Caren Griffins, MD  linagliptin (TRADJENTA) 5 MG TABS tablet Take 5 mg by mouth daily.   Yes [provider]  magnesium gluconate (MAGONATE) 500 MG tablet Take 500 mg by mouth daily as needed (for leg cramps).    Yes [provider]  omeprazole (PRILOSEC) 40 MG capsule Take 40 mg by mouth daily.     Yes [provider]  OXYGEN Inhale 1.5 L  into the lungs at bedtime.    Yes [provider]  PARoxetine (PAXIL) 30 MG tablet Take 30 mg by mouth daily.     Yes [provider]  tiZANidine (ZANAFLEX) 2 MG tablet Take 2 mg by mouth at bedtime as needed for muscle spasms.    Yes [provider]  TRELEGY ELLIPTA 100-62.5-25 MCG/INH AEPB Inhale 1 puff into the lungs daily. 11/27/17  Yes [provider]  warfarin (COUMADIN) 5 MG tablet Take 2.5-5 mg by mouth See admin instructions. Take 5 mg by mouth daily on Sunday and Thursday. Take 2.5 mg by mouth daily on all other days   Yes [provider]  doxycycline (VIBRAMYCIN) 100 MG capsule Take 1 capsule (100 mg total) by mouth 2 (two) times daily. Patient not taking: Reported on 07/17/2019 02/16/18   Caren Griffins, MD  HYDROcodone-homatropine [facility]) 5-1.5 MG/5ML syrup Take 5 mLs by mouth every 6 (six) hours as needed for cough. Patient not taking: Reported on 07/17/2019 02/16/18   Caren Griffins, MD  predniSONE (DELTASONE) 20 MG tablet Take 1 tablet (20 mg total) by mouth daily with breakfast. Patient not taking: Reported on 07/17/2019 02/16/18   Caren Griffins, MD    Allergies    Adhesive [tape], Atorvastatin, Metformin and related, and Sulfonamide derivatives  Review of Systems   Review of Systems  Constitutional: Negative for chills and fever.  HENT: Negative for ear pain and sore throat.   Eyes: Negative for visual disturbance.   Respiratory: Positive for cough (chronic) and shortness of breath (chronic).   Cardiovascular: Positive for chest pain. Negative for palpitations and leg swelling.  Gastrointestinal: Positive for constipation and nausea. Negative for abdominal pain, blood in stool, diarrhea and vomiting.  Genitourinary: Negative for dysuria and hematuria.  Musculoskeletal: Negative for back pain.  Skin: Negative for rash.  Neurological: Negative for headaches.  All other systems reviewed and are negative.   Physical Exam Updated Vital Signs BP 128/78   Pulse 67   Temp 98.3 F (36.8 C) (Oral)   Resp 15   Ht 5\' 6"  (1.676 m)   Wt 92.5 kg   SpO2 97%   BMI 32.93 kg/m   Physical Exam Vitals and nursing note reviewed.  Constitutional:      General: He is not in acute distress.    Appearance: He is well-developed.  HENT:     Head: Normocephalic and atraumatic.  Eyes:     Conjunctiva/sclera: Conjunctivae normal.  Cardiovascular:     Rate and Rhythm: Normal rate and regular rhythm.     Heart sounds: No murmur.  Pulmonary:     Effort: Pulmonary effort is normal. No respiratory distress.     Breath sounds: Normal breath sounds. No decreased breath sounds, wheezing, rhonchi or rales.  Chest:     Chest wall: Tenderness (mild left chest wall ttp) present.  Abdominal:     Palpations: Abdomen is soft.     Tenderness: There is no abdominal tenderness.  Musculoskeletal:     Cervical back: Neck supple.     Right lower leg: No tenderness. No edema.     Left lower leg: No tenderness. No edema.  Skin:    General: Skin is warm and dry.     Findings: No rash.  Neurological:     Mental Status: He is alert.     ED Results / Procedures / Treatments   Labs (all labs ordered are listed, but only abnormal results are displayed) Labs Reviewed  BASIC METABOLIC PANEL - Abnormal; Notable for the following components:      Result Value   Sodium 134 (*)    Glucose, Bld 306 (*)    Creatinine, Ser 1.28 (*)     GFR calc non Af Amer 39 (*)    GFR calc Af Amer 46 (*)    All other components within normal limits  CBC - Abnormal; Notable for the following components:   WBC 13.5 (*)    All other components within normal limits  PROTIME-INR - Abnormal; Notable for the following components:   Prothrombin Time 20.7 (*)    INR 1.9 (*)    All other components within normal limits  TROPONIN I (HIGH SENSITIVITY)  TROPONIN I (HIGH SENSITIVITY)    EKG EKG Interpretation  Date/Time:  Friday Jul 17 2019 10:59:12 EDT Ventricular Rate:  81 PR Interval:    QRS Duration: 156 QT Interval:  446 QTC Calculation: 518 R Axis:   106 Text Interpretation: Ventricular-paced rhythm Abnormal ECG Confirmed by Virgel Manifold [phone]) on 07/17/2019 12:19:14 PM   EKG Interpretation  Date/Time:  Friday Jul 17 2019 14:47:04 EDT Ventricular Rate:  90 PR Interval:    QRS Duration: 149 QT Interval:  430 QTC Calculation: 555 R Axis:   -25 Text Interpretation: Sinus rhythm Atrial premature complexes Left bundle branch block Confirmed by Virgel Manifold [phone]) on 07/17/2019 3:10:13 PM         Radiology DG Chest 2 View  Result Date: 07/17/2019 CLINICAL DATA:  Chest pain EXAM: CHEST - 2 VIEW COMPARISON:  February 13, 2018 FINDINGS: The lungs are clear. Heart size and pulmonary vascularity are normal. No adenopathy. Patient is status post mitral valve replacement. Pacemaker leads are attached the right atrium, right ventricle, and coronary sinus. No adenopathy. There is aortic atherosclerosis. No pneumothorax. There is mild degenerative change in the thoracic spine. IMPRESSION: No edema or airspace opacity. Cardiac silhouette within normal limits. Postoperative changes noted. Aortic Atherosclerosis (ICD10-I70.0). Electronically Signed   By: Lowella Grip III M.D.   On: 07/17/2019 11:44    Procedures Procedures (including critical care time)  Medications Ordered in ED Medications  sodium chloride flush (NS) 0.9 %  injection 3 mL (has no administration in time range)  alum & mag hydroxide-simeth (MAALOX/MYLANTA) 200-200-20 MG/5ML suspension 30 mL (30 mLs Oral Given 07/17/19 1454)    ED Course  I have reviewed the triage vital signs and the nursing notes.  Pertinent labs & imaging results that were available during my care of the patient were reviewed by me and considered in my medical decision making (see chart for details).    MDM Rules/Calculators/A&P                      79 year old female presenting for evaluation of left-sided chest pain ongoing for the last several days.  Pain is intermittent and worse when she lays on the left side.  Not associated with exertion.  Not significantly more short of breath than normal.  Does have history of COPD.  Patient's vital signs are reassuring.  He does have some left lower chest tenderness on exam.  Her cardiac and pulmonary exams are otherwise unremarkable.  She has no peripheral edema.  Pulses are normal.  Reviewed/interpreted labs CBC shows a mild leukocytosis, no anemia Blood sugar elevated but normal bicarb and no elevated anion gap.  Creatinine is at baseline. Initial troponin is negative, delta troponin PT/INR is mildly subtherapeutic but not significantly abnormal.  Initial EKG showed V paced rhythm.  Repeat EKG showed normal sinus rhythm with some PACs.  She not have any evidence of A. Fib today.  Patient with intermittent left-sided chest pain.  Tenderness to the left chest on exam.  No abdominal tenderness.  Labs reassuring.  Symptoms are atypical for ACS at this time.  Cardiac work-up is negative.  Low suspicion for PE and patient is already anticoagulated.  Clinically have low suspicion for any other emergent cardiac/pulmonary/intra-abdominal cause.  We will she is appropriate to follow-up with her cardiologist and return to ED for new or worsening symptoms.  She voiced understanding plan reasons to return.  Questions answered.  Stable for  discharge.  Pt seen in conjunction with Dr. Wilson Singer who personally evaluated the patient and is in agreement with plan.   Final Clinical Impression(s) / ED Diagnoses Final diagnoses:  Atypical chest pain    Rx / DC Orders ED Discharge Orders    None       Bishop Dublin 07/17/19 1550    Virgel Manifold, MD 07/18/19 1728

## 2019-07-17 NOTE — Telephone Encounter (Signed)
Manual transmission reviewed.  Pt appears to be in AF x 2.5days now, V rates mostly controlled.   Spoke with pt, reports CP in left side of chest "just behind my heart" It is difficult to lay on left side.  SOB at baseline consistent with history of COPD.  Intermittent dizziness.    Advised pt of ongoing AF episode, given the severity of CP experienced recommended ED assessment.

## 2019-07-17 NOTE — ED Triage Notes (Signed)
Pt reports L sided chest pain/pain under L breast that she states has been going on for the past few days, contacted her doctor who had her send in a reading from her pacemaker that just showed she was in afib, reports hx of the same but states her doctor wanted her to come in for further eval. A/ox4, resp e/u, nad.

## 2019-07-17 NOTE — ED Notes (Signed)
Pt denies pain at this time, pt reminded to keep monitoring devices on during her stay

## 2019-07-17 NOTE — Telephone Encounter (Signed)
Patient called in c/o L side chest pain for a few days now and when she lays on her right side it hurts more. Chest pains are becoming more sharp and some sob but pt does have copd and she uses her nebulizer. I instructed patient to send a transmission from her home monitor

## 2019-07-20 DIAGNOSIS — I480 Paroxysmal atrial fibrillation: Secondary | ICD-10-CM | POA: Diagnosis not present

## 2019-07-20 DIAGNOSIS — E113299 Type 2 diabetes mellitus with mild nonproliferative diabetic retinopathy without macular edema, unspecified eye: Secondary | ICD-10-CM | POA: Diagnosis not present

## 2019-07-20 DIAGNOSIS — E559 Vitamin D deficiency, unspecified: Secondary | ICD-10-CM | POA: Diagnosis not present

## 2019-07-20 DIAGNOSIS — M5136 Other intervertebral disc degeneration, lumbar region: Secondary | ICD-10-CM | POA: Diagnosis not present

## 2019-07-20 DIAGNOSIS — J301 Allergic rhinitis due to pollen: Secondary | ICD-10-CM | POA: Diagnosis not present

## 2019-07-20 DIAGNOSIS — L309 Dermatitis, unspecified: Secondary | ICD-10-CM | POA: Diagnosis not present

## 2019-07-20 DIAGNOSIS — J449 Chronic obstructive pulmonary disease, unspecified: Secondary | ICD-10-CM | POA: Diagnosis not present

## 2019-07-20 DIAGNOSIS — J309 Allergic rhinitis, unspecified: Secondary | ICD-10-CM | POA: Diagnosis not present

## 2019-07-20 DIAGNOSIS — I10 Essential (primary) hypertension: Secondary | ICD-10-CM | POA: Diagnosis not present

## 2019-07-20 DIAGNOSIS — E1121 Type 2 diabetes mellitus with diabetic nephropathy: Secondary | ICD-10-CM | POA: Diagnosis not present

## 2019-07-21 DIAGNOSIS — R69 Illness, unspecified: Secondary | ICD-10-CM | POA: Diagnosis not present

## 2019-07-22 ENCOUNTER — Telehealth: Payer: Self-pay | Admitting: Physician Assistant

## 2019-07-22 NOTE — Telephone Encounter (Signed)
New Message   Patient is calling because she would need to bring her daughter with her to her appt. Please advise.

## 2019-07-26 DIAGNOSIS — J438 Other emphysema: Secondary | ICD-10-CM | POA: Diagnosis not present

## 2019-07-26 NOTE — Progress Notes (Signed)
Cardiology Office Note Date:  07/26/2019  Patient ID:  Emily Abbott, Emily Abbott [birth year], MRN [id number] PCP:  Harlan Stains, MD  Electrophysiologist: Dr. Lovena Le   Chief Complaint:  annual visit, f/u on ER visit  History of Present Illness: ALONZO LOVING is a 79 y.o. adult with history of no known CAD, Notes mention history of normal coronaries by cath in 2010, no further cath procedures that I find, CM, LBBB, w/ICD, chronic CHF (systolic w/recovered LVEF by last echos), HTN, HLD, DM, COPD with QHS and PRN home O2, uses infrequently other then at HS,  Hx of PE and PAFib on warfarin.  She also has hx of MV repair (29mm Physio ring annuloplasty) done in 2010, with LAA ligation and MAZE inter-op.  She comes in today to be seen for Dr. Lovena Le, last seen by him July 2020, shew was doing well, no changes were made.  She called the office 07/17/19 with c/o CP, remote device transmission noted AF in the last 2.5 days, generally rate controlled, though given her reports of CP was recommended ER evaluation.  07/17/19: ER exam noted CW tenderness, no notable change to her baseline SOB (COPD), note reports she was in SR, labs fairly unremarkable, not felt to have ACS and discharged from the ER with atypical CP.  LABS K+ 4.1 BUN/Creat 16/1.28 HS Trop 10 > 8 WC 13.5 H/H 12/38 Plts 280   She comes today accompanied by her daughter that she is living with now.   The CP she was feeling she says was sharp, and random,  Initially was R neck that rad down to her R chest, then was having them in different spots, sometime low L thorax, other times low R, no rhyme or reason.  She was also feeling a "flip-flop: in her heart beat intermittently.  She did not notice any of these are particularly exertional, biut was more aware of her irregular heart beat when laying on the left side.  She has some baseline SOB with her COPD.  In the last few months she has been on/off antibiotics for suspected sinus infection, or  infection of some kind, "they were never sure", also ha a steroid dose pack. Uncertain of timing though at some point her DM went from fairly well controlled to [facility] with sugars regularly in the 400's in the last month or so. They are working very hard at this with her PMD.  She is now getting BS bore regularly in the 200's and is feeling better as well.  Noting when her BS was so high she was often feeling dizzy/lightheaded. No reports of syncope.  Her warfarin is monitored and managed with her PMD, she will occasional have slight nose bleed when her sinuses are acting up, no bleeding otherwise.   Device information: SJM CRT-D, implanted 03/17/09 > gen change downgrade to CRT-Appril 2019  AFib Hx AAD Hx Tikosyn >> stopped with progression to persistent AFib  Past Medical History:  Diagnosis Date  . Anemia   . Anxiety   . CAD (coronary artery disease)   . Chronic kidney disease    CKD stage 3   . COPD (chronic obstructive pulmonary disease) (HCC)    uses  1 to and1.5 L of o2 at bedtime ; has not seen her pulm doctor in over a year ; has not had to uses inhlaer in a over a year    . Diabetes mellitus    type II  . GERD (gastroesophageal reflux disease)   .  HTN (hypertension)    essential  . Hyperlipidemia, mixed   . Mitral valve disorder   . Osteoarthritis   . Panic attack   . Paroxysmal atrial fibrillation (HCC)   . Pneumonia    2010  . Pulmonary embolism ([place]) 2010  . Sleep apnea    does not uses cpap since she got a URI from it. has home 02 as needed    Past Surgical History:  Procedure Laterality Date  . BIOPSY BREAST    . BIV ICD GENERATOR CHANGEOUT N/A 06/06/2017   Procedure: BIV ICD GENERATOR CHANGEOUT;  Surgeon: Evans Lance, MD;  Location: [place] [facility];  Service: Cardiovascular;  Laterality: N/A;  . CARDIAC VALVE SURGERY     mitral  . carpal tennel  2003  . EYE SURGERY  2015  or 2016   cataract surgery   . PARATHYROIDECTOMY Left 06/17/2017   Procedure:  LEFT UPPER PARATHYROIDECTOMY;  Surgeon: Clovis Riley, MD;  Location: [facility];  Service: General;  Laterality: Left;  . TUBAL LIGATION      over 60 years ago     Current Outpatient Medications  Medication Sig Dispense Refill  . acetaminophen (TYLENOL) 325 MG tablet Take 2 tablets (650 mg total) by mouth every 6 (six) hours as needed for mild pain or moderate pain.    Marland Kitchen albuterol (PROAIR HFA) 108 (90 Base) MCG/ACT inhaler Inhale 1-2 puffs into the lungs every 6 (six) hours as needed for wheezing or shortness of breath. 18 g 5  . ALPRAZolam (XANAX) 0.5 MG tablet Take 0.5 mg by mouth 2 (two) times daily as needed for anxiety or sleep.     Marland Kitchen amLODipine (NORVASC) 5 MG tablet Take 5 mg by mouth daily.    . Azelastine HCl 137 MCG/SPRAY SOLN Place 2 sprays into both nostrils at bedtime.    . calcium carbonate (TUMS EX) 750 MG chewable tablet Chew 2 tablets by mouth as needed for heartburn.    . carvedilol (COREG) 6.25 MG tablet Take 6.25 mg by mouth 2 (two) times daily with a meal.    . cholecalciferol (VITAMIN D3) 25 MCG (1000 UNIT) tablet Take 1,000 Units by mouth daily.    . diphenhydrAMINE (BENADRYL) 25 MG tablet Take 12.5 mg by mouth daily as needed for itching.    Marland Kitchen doxycycline (VIBRAMYCIN) 100 MG capsule Take 1 capsule (100 mg total) by mouth 2 (two) times daily. (Patient not taking: Reported on 07/17/2019) 8 capsule 0  . ezetimibe (ZETIA) 10 MG tablet Take 10 mg by mouth daily.    . furosemide (LASIX) 40 MG tablet Take 40 mg by mouth daily.    Marland Kitchen HYDROcodone-acetaminophen (NORCO/VICODIN) 5-325 MG tablet Take 0.5-1 tablets by mouth at bedtime.     Marland Kitchen HYDROcodone-homatropine (HYCODAN) 5-1.5 MG/5ML syrup Take 5 mLs by mouth every 6 (six) hours as needed for cough. (Patient not taking: Reported on 07/17/2019) 120 mL 0  . Insulin Glargine (BASAGLAR KWIKPEN) 100 UNIT/ML Inject 14 Units into the skin daily.    Marland Kitchen ipratropium-albuterol (DUONEB) 0.5-2.5 (3) MG/3ML SOLN Take 3 mLs by nebulization every 6  (six) hours as needed. 360 mL 2  . linagliptin (TRADJENTA) 5 MG TABS tablet Take 5 mg by mouth daily.    . magnesium gluconate (MAGONATE) 500 MG tablet Take 500 mg by mouth daily as needed (for leg cramps).     Marland Kitchen omeprazole (PRILOSEC) 40 MG capsule Take 40 mg by mouth daily.      . OXYGEN Inhale  1.5 L into the lungs at bedtime.     Marland Kitchen PARoxetine (PAXIL) 30 MG tablet Take 30 mg by mouth daily.      . predniSONE (DELTASONE) 20 MG tablet Take 1 tablet (20 mg total) by mouth daily with breakfast. (Patient not taking: Reported on 07/17/2019) 5 tablet 0  . tiZANidine (ZANAFLEX) 2 MG tablet Take 2 mg by mouth at bedtime as needed for muscle spasms.     . TRELEGY ELLIPTA 100-62.5-25 MCG/INH AEPB Inhale 1 puff into the lungs daily.  12  . warfarin (COUMADIN) 5 MG tablet Take 2.5-5 mg by mouth See admin instructions. Take 5 mg by mouth daily on Sunday and Thursday. Take 2.5 mg by mouth daily on all other days     No current facility-administered medications for this visit.    Allergies:   Adhesive [tape], Atorvastatin, Metformin and related, and Sulfonamide derivatives   Social History:  The patient  reports that he quit smoking about 18 years ago. His smoking use included cigarettes. He has a 50.00 pack-year smoking history. He has never used smokeless tobacco. He reports that he does not drink alcohol or use drugs.   Family History:  The patient's family history includes Anuerysm in his father; Cancer in his brother; Diabetes in his sister; Stroke in his mother; Ulcers in his sister.  ROS:  Please see the history of present illness.    All other systems are reviewed and otherwise negative.   PHYSICAL EXAM: VS:  There were no vitals taken for this visit. BMI: There is no height or weight on file to calculate BMI. Well nourished, well developed, in no acute distress  HEENT: normocephalic, atraumatic, raspy voice Neck: no JVD, carotid bruits or masses1/6SMno significant murmurs, are appreciated no  rubs, or gallops Lungs:  CTA b/l, no wheezing, rhonchi or rales  Abd: soft, nontender MS: no deformity, age appropriate atrophy Ext:  no edema is appreciated Skin: warm and dry, no rash Neuro:  No gross deficits appreciated today Psych: euthymic mood, full affect  PPM site is stable, no tethering or discomfort   EKG:  Done 07/17/19 in the ER are personally reviewed Suspect was AFlutter   ICD interrogation done today and reviewed by myself:  She presents AS/BP paced today Battery and lead measurements are good. Since July 2020 AMS 35009 episodes 42% burden EGMs available are reviewed One is labeled noise reversion, this looks like Afib to me Otherwise largely her AMS episodes that I am able to see look ATw/block and intermittent BP Some are Aflutter All appear rate controlled CorView is at threshold, trending upwards   11/15/14: TTE Study Conclusions - Left ventricle: The cavity size was normal. Wall thickness was   normal. Systolic function was normal. The estimated ejection   fraction was in the range of 60% to 65%. Wall motion was normal;   there were no regional wall motion abnormalities. Previous mitral   surgery and the presence of atrial fibrillation prevents   evaluation of LV diastolic function. - Mitral valve: Prior procedures included surgical repair. An   annular ring prosthesis was present. Valve area by pressure   half-time: 1.71 cm^2. Valve area by continuity equation (using   LVOT flow): 2.09 cm^2. - Left atrium: The atrium was mildly dilated. - Pulmonary arteries: Systolic pressure was mildly increased. PA   peak pressure: 39 mm Hg (S).  2015: LVEF 50-55% 2010: LVEF 35%  Recent Labs: 07/17/2019: BUN 16; Creatinine, Ser 1.28; Hemoglobin 12.3; Platelets 280; Potassium  4.1; Sodium 134  No results found for requested labs within last 8760 hours.   Estimated Creatinine Clearance (by C-G formula based on SCr of 1.28 mg/dL (H)) Female: 40.2 mL/min  (A) Female: 49 mL/min (A)   Wt Readings from Last 3 Encounters:  07/17/19 204 lb (92.5 kg)  08/25/18 210 lb 12.8 oz (95.6 kg)  02/13/18 208 lb (94.3 kg)     Other studies reviewed: Additional studies/records reviewed today include: summarized above  ASSESSMENT AND PLAN:  1. ICD     Intact function, no VT or VF  2. Hx of CM with recovered LVEF by last couple echos 3. Chronic CHF     She appears euvolemic by exam and CorVue and weight        4. VHD w/Hx of MV repair     No significant murmur on exam     Will update her echo given has been years   5. Paroxysmal >>> persistent AFib, hx of PE     CHA2DS2Vasc is 5, on warfarin     burden has been about the same looking at her AF trends over the year     I think largely unaware     Rate controlled     AAD d/c last year with development of more persistent AFib  6. CP     Atypical     HS Trop neg     Not ongoing  Discussed importance of DM control, she is working hard at this and making good headway   Disposition: device remotes Q3 mo, and back in clinic 83mo, sooner if needed  Current medicines are reviewed at length with the patient today.  The patient did not have any concerns regarding medicines  Signed, Tommye Standard, PA-C 07/26/2019 8:02 AM     [place] [facility] [place]  [id number] [phone] (office)  [phone] (fax)

## 2019-07-27 ENCOUNTER — Other Ambulatory Visit: Payer: Self-pay

## 2019-07-27 ENCOUNTER — Ambulatory Visit: Payer: Medicare HMO | Admitting: Physician Assistant

## 2019-07-27 VITALS — BP 134/70 | HR 69 | Ht 66.5 in | Wt 202.0 lb

## 2019-07-27 DIAGNOSIS — I428 Other cardiomyopathies: Secondary | ICD-10-CM | POA: Diagnosis not present

## 2019-07-27 DIAGNOSIS — R0789 Other chest pain: Secondary | ICD-10-CM | POA: Diagnosis not present

## 2019-07-27 DIAGNOSIS — I4891 Unspecified atrial fibrillation: Secondary | ICD-10-CM | POA: Diagnosis not present

## 2019-07-27 DIAGNOSIS — Z950 Presence of cardiac pacemaker: Secondary | ICD-10-CM

## 2019-07-27 DIAGNOSIS — Z9889 Other specified postprocedural states: Secondary | ICD-10-CM

## 2019-07-27 DIAGNOSIS — I4819 Other persistent atrial fibrillation: Secondary | ICD-10-CM

## 2019-07-27 NOTE — Patient Instructions (Signed)
Medication Instructions:   Your physician recommends that you continue on your current medications as directed. Please refer to the Current Medication list given to you today.  *If you need a refill on your cardiac medications before your next appointment, please call your pharmacy*   Lab Work: NONE ORDERED  TODAY  If you have labs (blood work) drawn today and your tests are completely normal, you will receive your results only by: . MyChart Message (if you have MyChart) OR . A paper copy in the mail If you have any lab test that is abnormal or we need to change your treatment, we will call you to review the results.   Testing/Procedures: Your physician has requested that you have an echocardiogram. Echocardiography is a painless test that uses sound waves to create images of your heart. It provides your doctor with information about the size and shape of your heart and how well your heart's chambers and valves are working. This procedure takes approximately one hour. There are no restrictions for this procedure.   Follow-Up: At [facility], you and your health needs are our priority.  As part of our continuing mission to provide you with exceptional heart care, we have created designated Provider Care Teams.  These Care Teams include your primary Cardiologist (physician) and Advanced Practice Providers (APPs -  Physician Assistants and Nurse Practitioners) who all work together to provide you with the care you need, when you need it.  We recommend signing up for the patient portal called "MyChart".  Sign up information is provided on this After Visit Summary.  MyChart is used to connect with patients for Virtual Visits (Telemedicine).  Patients are able to view lab/test results, encounter notes, upcoming appointments, etc.  Non-urgent messages can be sent to your provider as well.   To learn more about what you can do with MyChart, go to [email or web address].    Your next appointment:    6 month(s)  The format for your next appointment:   In Person  Provider:   You may see Dr.Taylor  or one of the following Advanced Practice Providers on your designated Care Team:    Amber Seiler, NP  Renee Ursuy, PA-C  Michael "Andy" Tillery, PA-C    Other Instructions   

## 2019-08-04 DIAGNOSIS — R69 Illness, unspecified: Secondary | ICD-10-CM | POA: Diagnosis not present

## 2019-08-12 DIAGNOSIS — J449 Chronic obstructive pulmonary disease, unspecified: Secondary | ICD-10-CM | POA: Diagnosis not present

## 2019-08-12 DIAGNOSIS — I130 Hypertensive heart and chronic kidney disease with heart failure and stage 1 through stage 4 chronic kidney disease, or unspecified chronic kidney disease: Secondary | ICD-10-CM | POA: Diagnosis not present

## 2019-08-12 DIAGNOSIS — I480 Paroxysmal atrial fibrillation: Secondary | ICD-10-CM | POA: Diagnosis not present

## 2019-08-12 DIAGNOSIS — E1142 Type 2 diabetes mellitus with diabetic polyneuropathy: Secondary | ICD-10-CM | POA: Diagnosis not present

## 2019-08-12 DIAGNOSIS — N183 Chronic kidney disease, stage 3 unspecified: Secondary | ICD-10-CM | POA: Diagnosis not present

## 2019-08-12 DIAGNOSIS — E1165 Type 2 diabetes mellitus with hyperglycemia: Secondary | ICD-10-CM | POA: Diagnosis not present

## 2019-08-12 DIAGNOSIS — R69 Illness, unspecified: Secondary | ICD-10-CM | POA: Diagnosis not present

## 2019-08-12 DIAGNOSIS — D509 Iron deficiency anemia, unspecified: Secondary | ICD-10-CM | POA: Diagnosis not present

## 2019-08-12 DIAGNOSIS — E785 Hyperlipidemia, unspecified: Secondary | ICD-10-CM | POA: Diagnosis not present

## 2019-08-12 DIAGNOSIS — E1121 Type 2 diabetes mellitus with diabetic nephropathy: Secondary | ICD-10-CM | POA: Diagnosis not present

## 2019-08-17 ENCOUNTER — Ambulatory Visit (HOSPITAL_COMMUNITY): Payer: Medicare HMO | Attending: Cardiovascular Disease

## 2019-08-17 ENCOUNTER — Other Ambulatory Visit: Payer: Self-pay

## 2019-08-17 DIAGNOSIS — I4819 Other persistent atrial fibrillation: Secondary | ICD-10-CM | POA: Diagnosis not present

## 2019-08-17 DIAGNOSIS — Z7901 Long term (current) use of anticoagulants: Secondary | ICD-10-CM | POA: Diagnosis not present

## 2019-08-18 DIAGNOSIS — E1121 Type 2 diabetes mellitus with diabetic nephropathy: Secondary | ICD-10-CM | POA: Diagnosis not present

## 2019-08-19 DIAGNOSIS — E1121 Type 2 diabetes mellitus with diabetic nephropathy: Secondary | ICD-10-CM | POA: Diagnosis not present

## 2019-08-25 DIAGNOSIS — J438 Other emphysema: Secondary | ICD-10-CM | POA: Diagnosis not present

## 2019-08-26 ENCOUNTER — Ambulatory Visit: Payer: Medicare HMO | Admitting: Pulmonary Disease

## 2019-08-26 ENCOUNTER — Encounter: Payer: Self-pay | Admitting: Pulmonary Disease

## 2019-08-26 ENCOUNTER — Other Ambulatory Visit: Payer: Self-pay

## 2019-08-26 VITALS — BP 132/70 | HR 78 | Temp 97.8°F | Ht 66.0 in | Wt 203.0 lb

## 2019-08-26 DIAGNOSIS — R911 Solitary pulmonary nodule: Secondary | ICD-10-CM

## 2019-08-26 MED ORDER — TRELEGY ELLIPTA 100-62.5-25 MCG/INH IN AEPB: 1.0000 | INHALATION_SPRAY | Freq: Every day | RESPIRATORY_TRACT | 12 refills | Status: DC

## 2019-08-26 NOTE — Patient Instructions (Signed)
History of COPD -Refill on Trelegy -Continue nebulization use as needed -Acute inhaler use as needed  Graded exercises as tolerated  History of lung nodule -Last CT in 2018 -We will repeat CT scan of the chest    Follow-up in 3 months

## 2019-08-26 NOTE — Progress Notes (Signed)
Emily Abbott    [id number]    1941-02-14  Primary Care Physician:White, Caren Griffins, MD  Referring Physician: Harlan Stains, MD [place],  [place] [address]  Chief complaint:   History of COPD  HPI:  Shortness of breath on exertion History of COPD History of lung nodule  Breathing has been relatively stable Continues to use Trelegy on a daily basis, nebulization use about 3 times a week  Has nasal stuffiness and congestion, cough with no sputum production Not feeling acutely ill Uses Flonase for nasal stuffiness  History of atrial fibrillation, currently in atrial flutter History of mitral valve repair  History of cardiomyopathy with recent echo showing normal ejection fraction, suboptimal to comment on pulmonary hypertension  Pulmonary function study from 2017 revealed very mild obstructive disease   Outpatient Encounter Medications as of 08/26/2019  Medication Sig  . acetaminophen (TYLENOL) 325 MG tablet Take 2 tablets (650 mg total) by mouth every 6 (six) hours as needed for mild pain or moderate pain.  Marland Kitchen albuterol (PROAIR HFA) 108 (90 Base) MCG/ACT inhaler Inhale 1-2 puffs into the lungs every 6 (six) hours as needed for wheezing or shortness of breath.  . ALPRAZolam (XANAX) 0.5 MG tablet Take 0.5 mg by mouth 2 (two) times daily as needed for anxiety or sleep.   Marland Kitchen amLODipine (NORVASC) 5 MG tablet Take 5 mg by mouth daily.  . Azelastine HCl 137 MCG/SPRAY SOLN Place 2 sprays into both nostrils at bedtime.  . calcium carbonate (TUMS EX) 750 MG chewable tablet Chew 2 tablets by mouth as needed for heartburn.  . carvedilol (COREG) 6.25 MG tablet Take 6.25 mg by mouth 2 (two) times daily with a meal.  . cholecalciferol (VITAMIN D3) 25 MCG (1000 UNIT) tablet Take 1,000 Units by mouth daily.  . diphenhydrAMINE (BENADRYL) 25 MG tablet Take 12.5 mg by mouth daily as needed for itching.  . ezetimibe (ZETIA) 10 MG tablet Take 10 mg by mouth  daily.  . furosemide (LASIX) 40 MG tablet Take 40 mg by mouth daily.  Marland Kitchen HYDROcodone-acetaminophen (NORCO/VICODIN) 5-325 MG tablet Take 0.5-1 tablets by mouth at bedtime.   Marland Kitchen HYDROcodone-homatropine (HYCODAN) 5-1.5 MG/5ML syrup Take 5 mLs by mouth every 6 (six) hours as needed for cough.  . Insulin Glargine (BASAGLAR KWIKPEN) 100 UNIT/ML Inject 14 Units into the skin daily.  Marland Kitchen ipratropium-albuterol (DUONEB) 0.5-2.5 (3) MG/3ML SOLN Take 3 mLs by nebulization every 6 (six) hours as needed.  . linagliptin (TRADJENTA) 5 MG TABS tablet Take 5 mg by mouth daily.  . magnesium gluconate (MAGONATE) 500 MG tablet Take 500 mg by mouth daily as needed (for leg cramps).   Marland Kitchen omeprazole (PRILOSEC) 40 MG capsule Take 40 mg by mouth daily.    . OXYGEN Inhale 1.5 L into the lungs at bedtime.   Marland Kitchen PARoxetine (PAXIL) 30 MG tablet Take 30 mg by mouth daily.    Marland Kitchen tiZANidine (ZANAFLEX) 2 MG tablet Take 2 mg by mouth at bedtime as needed for muscle spasms.   . TRELEGY ELLIPTA 100-62.5-25 MCG/INH AEPB Inhale 1 puff into the lungs daily.  Marland Kitchen warfarin (COUMADIN) 5 MG tablet Take 2.5-5 mg by mouth See admin instructions. Take 5 mg by mouth daily on Sunday and Thursday. Take 2.5 mg by mouth daily on all other days   No facility-administered encounter medications on file as of 08/26/2019.    Allergies as of 08/26/2019 - Review Complete 08/26/2019  Allergen Reaction  Noted  . Adhesive [tape] Other (See Comments) 06/04/2017  . Atorvastatin  06/17/2017  . Metformin and related  06/14/2017  . Sulfonamide derivatives Other (See Comments)     Past Medical History:  Diagnosis Date  . Anemia   . Anxiety   . CAD (coronary artery disease)   . Chronic kidney disease    CKD stage 3   . COPD (chronic obstructive pulmonary disease) (HCC)    uses  1 to and1.5 L of o2 at bedtime ; has not seen her pulm doctor in over a year ; has not had to uses inhlaer in a over a year    . Diabetes mellitus    type II  . GERD (gastroesophageal  reflux disease)   . HTN (hypertension)    essential  . Hyperlipidemia, mixed   . Mitral valve disorder   . Osteoarthritis   . Panic attack   . Paroxysmal atrial fibrillation (HCC)   . Pneumonia    2010  . Pulmonary embolism ([place]) 2010  . Sleep apnea    does not uses cpap since she got a URI from it. has home 02 as needed    Past Surgical History:  Procedure Laterality Date  . BIOPSY BREAST    . BIV ICD GENERATOR CHANGEOUT N/A 06/06/2017   Procedure: BIV ICD GENERATOR CHANGEOUT;  Surgeon: Evans Lance, MD;  Location: [place] [facility];  Service: Cardiovascular;  Laterality: N/A;  . CARDIAC VALVE SURGERY     mitral  . carpal tennel  2003  . EYE SURGERY  2015  or 2016   cataract surgery   . PARATHYROIDECTOMY Left 06/17/2017   Procedure: LEFT UPPER PARATHYROIDECTOMY;  Surgeon: Clovis Riley, MD;  Location: [facility];  Service: General;  Laterality: Left;  . TUBAL LIGATION      over 60 years ago     Family History  Problem Relation Age of Onset  . Stroke Mother   . Anuerysm Father   . Diabetes Sister   . Cancer Brother   . Ulcers Sister     Social History   Socioeconomic History  . Marital status: Widowed    Spouse name: Not on file  . Number of children: Not on file  . Years of education: Not on file  . Highest education level: Not on file  Occupational History  . Occupation: reitred  Tobacco Use  . Smoking status: Former Smoker    Packs/day: 1.00    Years: 50.00    Pack years: 50.00    Types: Cigarettes    Quit date: 02/19/2001    Years since quitting: 18.5  . Smokeless tobacco: Never Used  Vaping Use  . Vaping Use: Never used  Substance and Sexual Activity  . Alcohol use: No    Alcohol/week: 0.0 standard drinks  . Drug use: Never  . Sexual activity: Not on file  Other Topics Concern  . Not on file  Social History Narrative  . Not on file   Social Determinants of Health   Financial Resource Strain:   . Difficulty of Paying Living Expenses:     Food Insecurity:   . Worried About Charity fundraiser in the Last Year:   . Arboriculturist in the Last Year:   Transportation Needs:   . Film/video editor (Medical):   Marland Kitchen Lack of Transportation (Non-Medical):   Physical Activity:   . Days of Exercise per Week:   . Minutes of Exercise per Session:  Stress:   . Feeling of Stress :   Social Connections:   . Frequency of Communication with Friends and Family:   . Frequency of Social Gatherings with Friends and Family:   . Attends Religious Services:   . Active Member of Clubs or Organizations:   . Attends Archivist Meetings:   Marland Kitchen Marital Status:   Intimate Partner Violence:   . Fear of Current or Ex-Partner:   . Emotionally Abused:   Marland Kitchen Physically Abused:   . Sexually Abused:     Review of Systems  Respiratory: Positive for cough.   Cardiovascular: Negative for chest pain and palpitations.  Skin: Negative.   Psychiatric/Behavioral: Negative.  Negative for sleep disturbance.  All other systems reviewed and are negative.   Vitals:   08/26/19 1420  BP: 132/70  Pulse: 78  Temp: 97.8 F (36.6 C)  SpO2: 97%     Physical Exam Constitutional:      Appearance: He is obese.  HENT:     Head: Normocephalic and atraumatic.     Nose: No congestion.     Mouth/Throat:     Mouth: Mucous membranes are moist.     Pharynx: No oropharyngeal exudate or posterior oropharyngeal erythema.  Eyes:     General:        Right eye: No discharge.        Left eye: No discharge.  Cardiovascular:     Rate and Rhythm: Normal rate.     Pulses: Normal pulses.     Heart sounds: Normal heart sounds. No murmur heard.  No friction rub.  Pulmonary:     Effort: Pulmonary effort is normal. No respiratory distress.     Breath sounds: Normal breath sounds. No stridor. No wheezing or rhonchi.  Musculoskeletal:        General: Normal range of motion.     Cervical back: No rigidity or tenderness.  Skin:    General: Skin is warm.   Neurological:     General: No focal deficit present.     Mental Status: He is alert.  Psychiatric:        Mood and Affect: Mood normal.    Data Reviewed: CT scan of the chest reviewed with the patient showing dominant right upper lobe nodule, peripheral right middle lobe nodule, left lower lobe nodule all unchanged from previous Previous CT also reviewed  Echocardiogram from 2021 shows normal ejection fraction 55 to 60%, normal right ventricular size, right ventricular systolic function is normal Suboptimal for any mention of pulmonary hypertension  PFT seen-mild obstructive disease  Assessment:  Chronic obstructive pulmonary disease -Continue Trelegy -Continue bronchodilator treatments  History of atrial flutter  History of multiple lung nodules -Stable from previous studies  Plan/Recommendations: Repeat CT scan of the chest without contrast  Continue Trelegy  Graded exercise as tolerated  Encouraged to call with any significant concerns   Sherrilyn Rist MD [facility] 08/26/2019, 2:54 PM  CC: Harlan Stains, MD

## 2019-08-31 DIAGNOSIS — Z7901 Long term (current) use of anticoagulants: Secondary | ICD-10-CM | POA: Diagnosis not present

## 2019-09-09 ENCOUNTER — Inpatient Hospital Stay: Admission: RE | Admit: 2019-09-09 | Payer: Medicare HMO | Source: Ambulatory Visit

## 2019-09-15 DIAGNOSIS — E1142 Type 2 diabetes mellitus with diabetic polyneuropathy: Secondary | ICD-10-CM | POA: Diagnosis not present

## 2019-09-15 DIAGNOSIS — R69 Illness, unspecified: Secondary | ICD-10-CM | POA: Diagnosis not present

## 2019-09-15 DIAGNOSIS — Z7901 Long term (current) use of anticoagulants: Secondary | ICD-10-CM | POA: Diagnosis not present

## 2019-09-15 DIAGNOSIS — E1121 Type 2 diabetes mellitus with diabetic nephropathy: Secondary | ICD-10-CM | POA: Diagnosis not present

## 2019-09-15 DIAGNOSIS — Z7984 Long term (current) use of oral hypoglycemic drugs: Secondary | ICD-10-CM | POA: Diagnosis not present

## 2019-09-15 DIAGNOSIS — D509 Iron deficiency anemia, unspecified: Secondary | ICD-10-CM | POA: Diagnosis not present

## 2019-09-15 DIAGNOSIS — I480 Paroxysmal atrial fibrillation: Secondary | ICD-10-CM | POA: Diagnosis not present

## 2019-09-15 DIAGNOSIS — I130 Hypertensive heart and chronic kidney disease with heart failure and stage 1 through stage 4 chronic kidney disease, or unspecified chronic kidney disease: Secondary | ICD-10-CM | POA: Diagnosis not present

## 2019-09-15 DIAGNOSIS — J449 Chronic obstructive pulmonary disease, unspecified: Secondary | ICD-10-CM | POA: Diagnosis not present

## 2019-09-15 DIAGNOSIS — J441 Chronic obstructive pulmonary disease with (acute) exacerbation: Secondary | ICD-10-CM | POA: Diagnosis not present

## 2019-09-15 DIAGNOSIS — E1165 Type 2 diabetes mellitus with hyperglycemia: Secondary | ICD-10-CM | POA: Diagnosis not present

## 2019-09-15 DIAGNOSIS — E559 Vitamin D deficiency, unspecified: Secondary | ICD-10-CM | POA: Diagnosis not present

## 2019-09-15 DIAGNOSIS — E785 Hyperlipidemia, unspecified: Secondary | ICD-10-CM | POA: Diagnosis not present

## 2019-09-17 ENCOUNTER — Telehealth: Payer: Self-pay

## 2019-09-17 ENCOUNTER — Telehealth: Payer: Self-pay | Admitting: Emergency Medicine

## 2019-09-17 DIAGNOSIS — E1121 Type 2 diabetes mellitus with diabetic nephropathy: Secondary | ICD-10-CM | POA: Diagnosis not present

## 2019-09-17 NOTE — Telephone Encounter (Signed)
Patient called in with questions about her afib and atrial flutter and wanting to know if its safe for her to get the covid vaccine

## 2019-09-17 NOTE — Telephone Encounter (Signed)
Patient called asking would it be recommended that she get the Covid-19 vaccine. I informed her that Dr Lovena Le is recommending that patient's get the vaccine  She voiced concerns about vaccination due her cardiac history. I informed her that she is considered in the high risk category for those at risk in acquiring Covid-19 because of the cardiac issues she has. She said that she may consider getting vaccinated.Emily Abbott

## 2019-09-18 ENCOUNTER — Ambulatory Visit (INDEPENDENT_AMBULATORY_CARE_PROVIDER_SITE_OTHER)
Admission: RE | Admit: 2019-09-18 | Discharge: 2019-09-18 | Disposition: A | Discharge status: Home / Self Care | Payer: Medicare HMO | Source: Ambulatory Visit | Attending: Pulmonary Disease | Admitting: Pulmonary Disease

## 2019-09-18 ENCOUNTER — Other Ambulatory Visit: Payer: Self-pay

## 2019-09-18 DIAGNOSIS — R911 Solitary pulmonary nodule: Secondary | ICD-10-CM | POA: Diagnosis not present

## 2019-09-18 DIAGNOSIS — R918 Other nonspecific abnormal finding of lung field: Secondary | ICD-10-CM | POA: Diagnosis not present

## 2019-09-25 DIAGNOSIS — J438 Other emphysema: Secondary | ICD-10-CM | POA: Diagnosis not present

## 2019-10-02 ENCOUNTER — Ambulatory Visit (INDEPENDENT_AMBULATORY_CARE_PROVIDER_SITE_OTHER): Payer: Medicare HMO | Admitting: *Deleted

## 2019-10-02 DIAGNOSIS — I5022 Chronic systolic (congestive) heart failure: Secondary | ICD-10-CM | POA: Diagnosis not present

## 2019-10-04 LAB — CUP PACEART REMOTE DEVICE CHECK
Battery Remaining Longevity: 96 mo
Battery Remaining Percentage: 95.5 %
Battery Voltage: 2.99 V
Brady Statistic AP VP Percent: 28 %
Brady Statistic AP VS Percent: 1 %
Brady Statistic AS VP Percent: 71 %
Brady Statistic AS VS Percent: 1 %
Brady Statistic RA Percent Paced: 22 %
Date Time Interrogation Session: 20210813113139
Implantable Lead Implant Date: 20110127
Implantable Lead Implant Date: 20110127
Implantable Lead Implant Date: 20110127
Implantable Lead Location: 753858
Implantable Lead Location: 753859
Implantable Lead Location: 753860
Implantable Lead Model: 7122
Implantable Pulse Generator Implant Date: 20190418
Lead Channel Impedance Value: 1000 Ohm
Lead Channel Impedance Value: 430 Ohm
Lead Channel Impedance Value: 560 Ohm
Lead Channel Pacing Threshold Amplitude: 0.625 V
Lead Channel Pacing Threshold Amplitude: 1 V
Lead Channel Pacing Threshold Amplitude: 1.25 V
Lead Channel Pacing Threshold Pulse Width: 0.5 ms
Lead Channel Pacing Threshold Pulse Width: 0.5 ms
Lead Channel Pacing Threshold Pulse Width: 0.6 ms
Lead Channel Sensing Intrinsic Amplitude: 12 mV
Lead Channel Sensing Intrinsic Amplitude: 2.6 mV
Lead Channel Setting Pacing Amplitude: 2 V
Lead Channel Setting Pacing Amplitude: 2 V
Lead Channel Setting Pacing Amplitude: 2.5 V
Lead Channel Setting Pacing Pulse Width: 0.5 ms
Lead Channel Setting Pacing Pulse Width: 0.6 ms
Lead Channel Setting Sensing Sensitivity: 2 mV
Pulse Gen Model: 3222
Pulse Gen Serial Number: 9393517

## 2019-10-06 NOTE — Progress Notes (Signed)
Remote pacemaker transmission.   

## 2019-10-13 DIAGNOSIS — I10 Essential (primary) hypertension: Secondary | ICD-10-CM | POA: Diagnosis not present

## 2019-10-19 ENCOUNTER — Telehealth: Payer: Self-pay | Admitting: Pulmonary Disease

## 2019-10-19 NOTE — Telephone Encounter (Signed)
Spoke with pt and informed her CT has not been read yet but as soon as results were available someone would reach out the her. Pt stated understanding. Nothing further needed at this time.

## 2019-10-20 DIAGNOSIS — I480 Paroxysmal atrial fibrillation: Secondary | ICD-10-CM | POA: Diagnosis not present

## 2019-10-20 DIAGNOSIS — I130 Hypertensive heart and chronic kidney disease with heart failure and stage 1 through stage 4 chronic kidney disease, or unspecified chronic kidney disease: Secondary | ICD-10-CM | POA: Diagnosis not present

## 2019-10-20 DIAGNOSIS — D509 Iron deficiency anemia, unspecified: Secondary | ICD-10-CM | POA: Diagnosis not present

## 2019-10-20 DIAGNOSIS — E1165 Type 2 diabetes mellitus with hyperglycemia: Secondary | ICD-10-CM | POA: Diagnosis not present

## 2019-10-20 DIAGNOSIS — J449 Chronic obstructive pulmonary disease, unspecified: Secondary | ICD-10-CM | POA: Diagnosis not present

## 2019-10-20 DIAGNOSIS — E1142 Type 2 diabetes mellitus with diabetic polyneuropathy: Secondary | ICD-10-CM | POA: Diagnosis not present

## 2019-10-20 DIAGNOSIS — R69 Illness, unspecified: Secondary | ICD-10-CM | POA: Diagnosis not present

## 2019-10-20 DIAGNOSIS — E785 Hyperlipidemia, unspecified: Secondary | ICD-10-CM | POA: Diagnosis not present

## 2019-10-20 DIAGNOSIS — E1121 Type 2 diabetes mellitus with diabetic nephropathy: Secondary | ICD-10-CM | POA: Diagnosis not present

## 2019-10-20 DIAGNOSIS — J441 Chronic obstructive pulmonary disease with (acute) exacerbation: Secondary | ICD-10-CM | POA: Diagnosis not present

## 2019-10-26 DIAGNOSIS — J438 Other emphysema: Secondary | ICD-10-CM | POA: Diagnosis not present

## 2019-11-11 DIAGNOSIS — E1121 Type 2 diabetes mellitus with diabetic nephropathy: Secondary | ICD-10-CM | POA: Diagnosis not present

## 2019-11-11 DIAGNOSIS — D6869 Other thrombophilia: Secondary | ICD-10-CM | POA: Diagnosis not present

## 2019-11-11 DIAGNOSIS — E1142 Type 2 diabetes mellitus with diabetic polyneuropathy: Secondary | ICD-10-CM | POA: Diagnosis not present

## 2019-11-11 DIAGNOSIS — I429 Cardiomyopathy, unspecified: Secondary | ICD-10-CM | POA: Diagnosis not present

## 2019-11-11 DIAGNOSIS — R69 Illness, unspecified: Secondary | ICD-10-CM | POA: Diagnosis not present

## 2019-11-11 DIAGNOSIS — I480 Paroxysmal atrial fibrillation: Secondary | ICD-10-CM | POA: Diagnosis not present

## 2019-11-11 DIAGNOSIS — I130 Hypertensive heart and chronic kidney disease with heart failure and stage 1 through stage 4 chronic kidney disease, or unspecified chronic kidney disease: Secondary | ICD-10-CM | POA: Diagnosis not present

## 2019-11-11 DIAGNOSIS — J9611 Chronic respiratory failure with hypoxia: Secondary | ICD-10-CM | POA: Diagnosis not present

## 2019-11-11 DIAGNOSIS — E113299 Type 2 diabetes mellitus with mild nonproliferative diabetic retinopathy without macular edema, unspecified eye: Secondary | ICD-10-CM | POA: Diagnosis not present

## 2019-11-11 DIAGNOSIS — J449 Chronic obstructive pulmonary disease, unspecified: Secondary | ICD-10-CM | POA: Diagnosis not present

## 2019-11-11 DIAGNOSIS — E785 Hyperlipidemia, unspecified: Secondary | ICD-10-CM | POA: Diagnosis not present

## 2019-11-12 IMAGING — DX DG CHEST 1V PORT
1 series · 1 of 1 positions shown · non-contrast
Comparison: None.

CLINICAL DATA: Dyspnea

EXAM:
PORTABLE CHEST 1 VIEW

[chest ap]
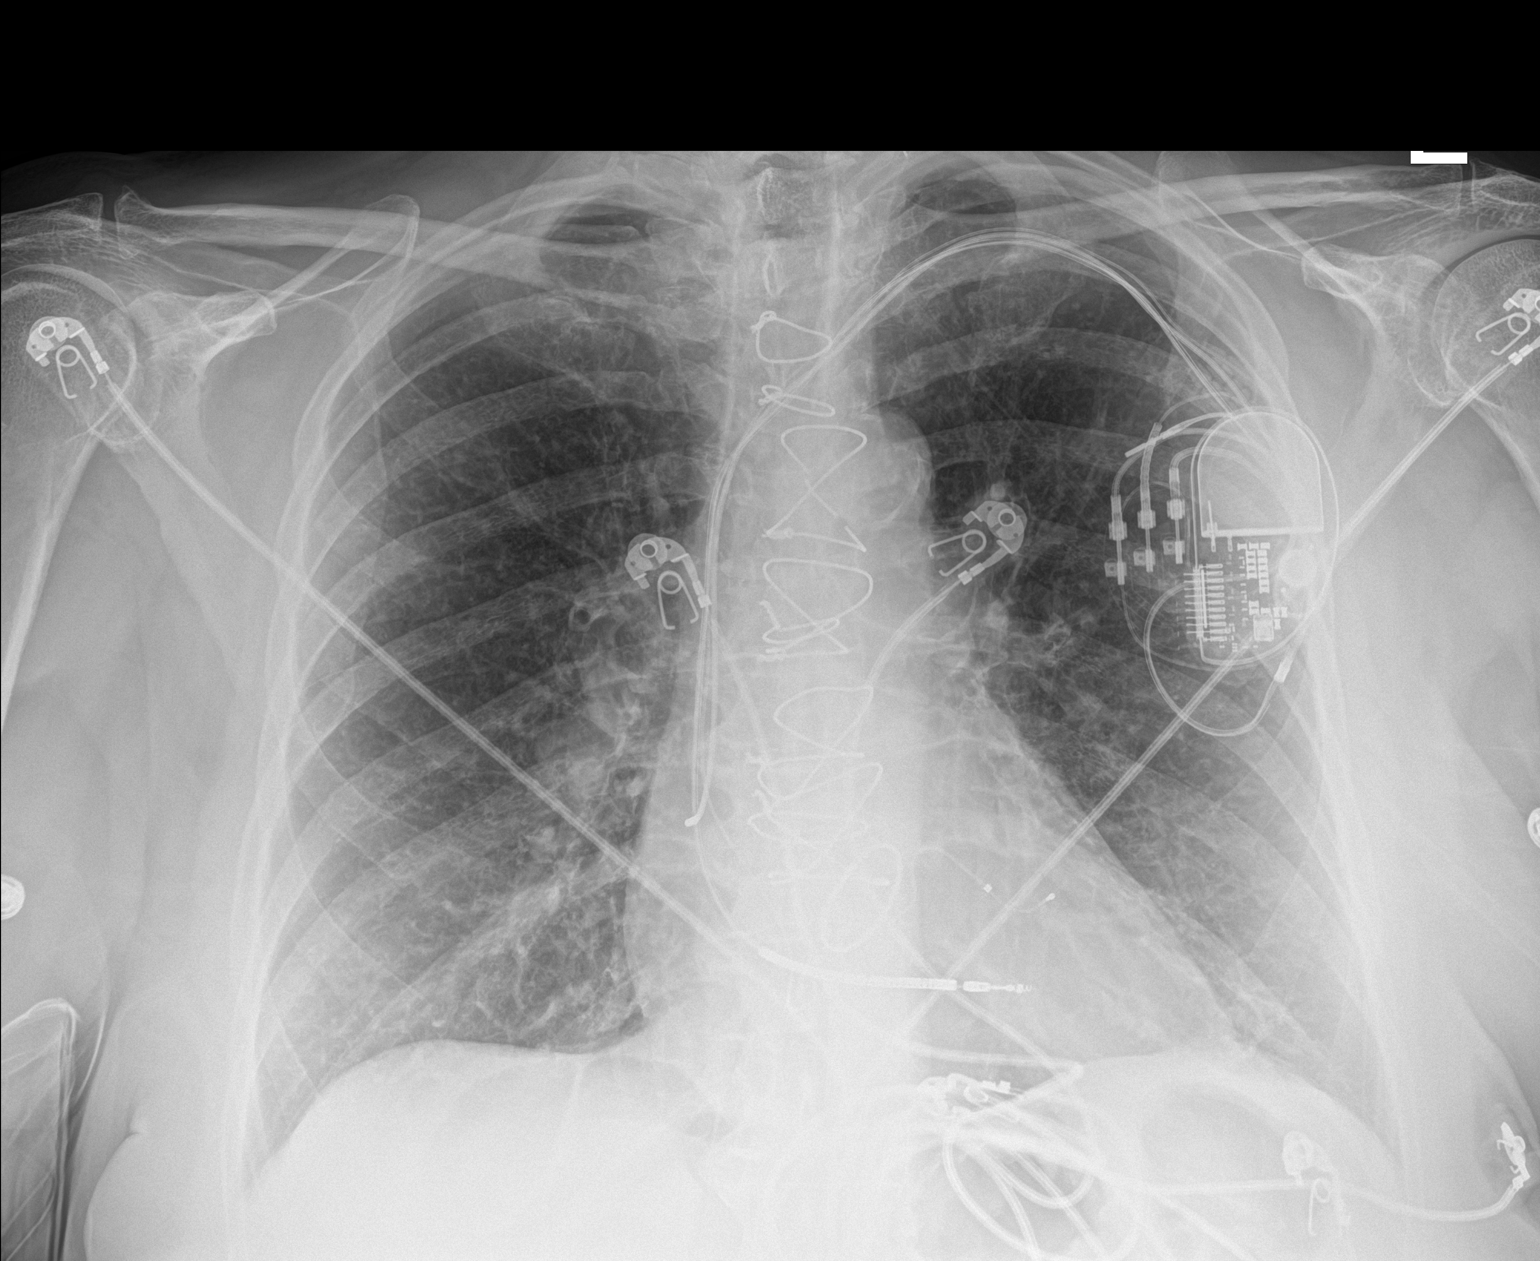

[1 of 1 positions shown; findings below may reference images not displayed]

FINDINGS: Normal heart size. Mild aortic atherosclerosis without aneurysm.
Median sternotomy sutures are in place with evidence of prior mitral
valvular repair. Left-sided ICD device with leads in the right
atrium, coronary sinus and right ventricle are noted. Minimal
atelectasis is seen at the left lung base. The lungs otherwise
appear clear. No effusion or pneumothorax. Osteoarthritis about the
included right AC and glenohumeral joints.
IMPRESSION: No active disease.

## 2019-11-14 DIAGNOSIS — D509 Iron deficiency anemia, unspecified: Secondary | ICD-10-CM | POA: Diagnosis not present

## 2019-11-14 DIAGNOSIS — R69 Illness, unspecified: Secondary | ICD-10-CM | POA: Diagnosis not present

## 2019-11-14 DIAGNOSIS — I130 Hypertensive heart and chronic kidney disease with heart failure and stage 1 through stage 4 chronic kidney disease, or unspecified chronic kidney disease: Secondary | ICD-10-CM | POA: Diagnosis not present

## 2019-11-14 DIAGNOSIS — E785 Hyperlipidemia, unspecified: Secondary | ICD-10-CM | POA: Diagnosis not present

## 2019-11-14 DIAGNOSIS — J449 Chronic obstructive pulmonary disease, unspecified: Secondary | ICD-10-CM | POA: Diagnosis not present

## 2019-11-14 DIAGNOSIS — E1121 Type 2 diabetes mellitus with diabetic nephropathy: Secondary | ICD-10-CM | POA: Diagnosis not present

## 2019-11-14 DIAGNOSIS — E1142 Type 2 diabetes mellitus with diabetic polyneuropathy: Secondary | ICD-10-CM | POA: Diagnosis not present

## 2019-11-14 DIAGNOSIS — E1165 Type 2 diabetes mellitus with hyperglycemia: Secondary | ICD-10-CM | POA: Diagnosis not present

## 2019-11-14 DIAGNOSIS — I480 Paroxysmal atrial fibrillation: Secondary | ICD-10-CM | POA: Diagnosis not present

## 2019-11-14 DIAGNOSIS — M170 Bilateral primary osteoarthritis of knee: Secondary | ICD-10-CM | POA: Diagnosis not present

## 2019-11-25 DIAGNOSIS — J438 Other emphysema: Secondary | ICD-10-CM | POA: Diagnosis not present

## 2019-11-27 DIAGNOSIS — J449 Chronic obstructive pulmonary disease, unspecified: Secondary | ICD-10-CM | POA: Diagnosis not present

## 2019-11-27 DIAGNOSIS — R69 Illness, unspecified: Secondary | ICD-10-CM | POA: Diagnosis not present

## 2019-11-27 DIAGNOSIS — E785 Hyperlipidemia, unspecified: Secondary | ICD-10-CM | POA: Diagnosis not present

## 2019-11-27 DIAGNOSIS — E1121 Type 2 diabetes mellitus with diabetic nephropathy: Secondary | ICD-10-CM | POA: Diagnosis not present

## 2019-11-27 DIAGNOSIS — I480 Paroxysmal atrial fibrillation: Secondary | ICD-10-CM | POA: Diagnosis not present

## 2019-11-27 DIAGNOSIS — M170 Bilateral primary osteoarthritis of knee: Secondary | ICD-10-CM | POA: Diagnosis not present

## 2019-11-27 DIAGNOSIS — J441 Chronic obstructive pulmonary disease with (acute) exacerbation: Secondary | ICD-10-CM | POA: Diagnosis not present

## 2019-11-27 DIAGNOSIS — D509 Iron deficiency anemia, unspecified: Secondary | ICD-10-CM | POA: Diagnosis not present

## 2019-11-27 DIAGNOSIS — E1165 Type 2 diabetes mellitus with hyperglycemia: Secondary | ICD-10-CM | POA: Diagnosis not present

## 2019-11-27 DIAGNOSIS — E1142 Type 2 diabetes mellitus with diabetic polyneuropathy: Secondary | ICD-10-CM | POA: Diagnosis not present

## 2019-11-28 DIAGNOSIS — R69 Illness, unspecified: Secondary | ICD-10-CM | POA: Diagnosis not present

## 2019-12-17 DIAGNOSIS — E113299 Type 2 diabetes mellitus with mild nonproliferative diabetic retinopathy without macular edema, unspecified eye: Secondary | ICD-10-CM | POA: Diagnosis not present

## 2019-12-18 DIAGNOSIS — E113299 Type 2 diabetes mellitus with mild nonproliferative diabetic retinopathy without macular edema, unspecified eye: Secondary | ICD-10-CM | POA: Diagnosis not present

## 2019-12-22 DIAGNOSIS — Z7901 Long term (current) use of anticoagulants: Secondary | ICD-10-CM | POA: Diagnosis not present

## 2019-12-26 DIAGNOSIS — J438 Other emphysema: Secondary | ICD-10-CM | POA: Diagnosis not present

## 2019-12-29 DIAGNOSIS — E1142 Type 2 diabetes mellitus with diabetic polyneuropathy: Secondary | ICD-10-CM | POA: Diagnosis not present

## 2019-12-29 DIAGNOSIS — E1165 Type 2 diabetes mellitus with hyperglycemia: Secondary | ICD-10-CM | POA: Diagnosis not present

## 2019-12-29 DIAGNOSIS — R69 Illness, unspecified: Secondary | ICD-10-CM | POA: Diagnosis not present

## 2019-12-29 DIAGNOSIS — E785 Hyperlipidemia, unspecified: Secondary | ICD-10-CM | POA: Diagnosis not present

## 2019-12-29 DIAGNOSIS — J449 Chronic obstructive pulmonary disease, unspecified: Secondary | ICD-10-CM | POA: Diagnosis not present

## 2019-12-29 DIAGNOSIS — E1121 Type 2 diabetes mellitus with diabetic nephropathy: Secondary | ICD-10-CM | POA: Diagnosis not present

## 2019-12-29 DIAGNOSIS — J441 Chronic obstructive pulmonary disease with (acute) exacerbation: Secondary | ICD-10-CM | POA: Diagnosis not present

## 2019-12-29 DIAGNOSIS — D509 Iron deficiency anemia, unspecified: Secondary | ICD-10-CM | POA: Diagnosis not present

## 2019-12-29 DIAGNOSIS — I480 Paroxysmal atrial fibrillation: Secondary | ICD-10-CM | POA: Diagnosis not present

## 2019-12-29 DIAGNOSIS — I130 Hypertensive heart and chronic kidney disease with heart failure and stage 1 through stage 4 chronic kidney disease, or unspecified chronic kidney disease: Secondary | ICD-10-CM | POA: Diagnosis not present

## 2020-01-01 ENCOUNTER — Ambulatory Visit (INDEPENDENT_AMBULATORY_CARE_PROVIDER_SITE_OTHER): Payer: Medicare HMO

## 2020-01-01 DIAGNOSIS — I428 Other cardiomyopathies: Secondary | ICD-10-CM

## 2020-01-02 LAB — CUP PACEART REMOTE DEVICE CHECK
Battery Remaining Longevity: 97 mo
Battery Remaining Percentage: 95.5 %
Battery Voltage: 2.99 V
Brady Statistic AP VP Percent: 31 %
Brady Statistic AP VS Percent: 1 %
Brady Statistic AS VP Percent: 68 %
Brady Statistic AS VS Percent: 1 %
Brady Statistic RA Percent Paced: 23 %
Date Time Interrogation Session: 20211112111321
Implantable Lead Implant Date: 20110127
Implantable Lead Implant Date: 20110127
Implantable Lead Implant Date: 20110127
Implantable Lead Location: 753858
Implantable Lead Location: 753859
Implantable Lead Location: 753860
Implantable Lead Model: 7122
Implantable Pulse Generator Implant Date: 20190418
Lead Channel Impedance Value: 410 Ohm
Lead Channel Impedance Value: 530 Ohm
Lead Channel Impedance Value: 960 Ohm
Lead Channel Pacing Threshold Amplitude: 0.5 V
Lead Channel Pacing Threshold Amplitude: 1 V
Lead Channel Pacing Threshold Amplitude: 1.25 V
Lead Channel Pacing Threshold Pulse Width: 0.5 ms
Lead Channel Pacing Threshold Pulse Width: 0.5 ms
Lead Channel Pacing Threshold Pulse Width: 0.6 ms
Lead Channel Sensing Intrinsic Amplitude: 1.6 mV
Lead Channel Sensing Intrinsic Amplitude: 12 mV
Lead Channel Setting Pacing Amplitude: 2 V
Lead Channel Setting Pacing Amplitude: 2 V
Lead Channel Setting Pacing Amplitude: 2.5 V
Lead Channel Setting Pacing Pulse Width: 0.5 ms
Lead Channel Setting Pacing Pulse Width: 0.6 ms
Lead Channel Setting Sensing Sensitivity: 2 mV
Pulse Gen Model: 3222
Pulse Gen Serial Number: 9393517

## 2020-01-04 NOTE — Progress Notes (Signed)
Remote pacemaker transmission.   

## 2020-01-07 DIAGNOSIS — M79644 Pain in right finger(s): Secondary | ICD-10-CM | POA: Diagnosis not present

## 2020-01-18 DIAGNOSIS — E1121 Type 2 diabetes mellitus with diabetic nephropathy: Secondary | ICD-10-CM | POA: Diagnosis not present

## 2020-01-19 DIAGNOSIS — E1121 Type 2 diabetes mellitus with diabetic nephropathy: Secondary | ICD-10-CM | POA: Diagnosis not present

## 2020-01-21 DIAGNOSIS — Z7901 Long term (current) use of anticoagulants: Secondary | ICD-10-CM | POA: Diagnosis not present

## 2020-01-21 DIAGNOSIS — M170 Bilateral primary osteoarthritis of knee: Secondary | ICD-10-CM | POA: Diagnosis not present

## 2020-01-25 DIAGNOSIS — J438 Other emphysema: Secondary | ICD-10-CM | POA: Diagnosis not present

## 2020-01-26 ENCOUNTER — Other Ambulatory Visit: Payer: Self-pay

## 2020-01-26 ENCOUNTER — Ambulatory Visit: Payer: Medicare HMO | Admitting: Internal Medicine

## 2020-01-26 ENCOUNTER — Encounter: Payer: Self-pay | Admitting: Internal Medicine

## 2020-01-26 VITALS — BP 120/64 | HR 77 | Ht 66.0 in | Wt 200.6 lb

## 2020-01-26 DIAGNOSIS — I5022 Chronic systolic (congestive) heart failure: Secondary | ICD-10-CM

## 2020-01-26 DIAGNOSIS — Z950 Presence of cardiac pacemaker: Secondary | ICD-10-CM | POA: Diagnosis not present

## 2020-01-26 DIAGNOSIS — I480 Paroxysmal atrial fibrillation: Secondary | ICD-10-CM

## 2020-01-26 NOTE — Progress Notes (Signed)
HPI Emily Abbott returns for ongoing ICD followup. She is a pleasant 79yo woman with a h/o CAD, s/p MI, chronic systolic heart failure, s/p BiV ICD. She has been stable. She has occaisional palpitations. She notes dyspnea with exertion. Mild peripheral edema which she treats with diuretic therapy. No ICD shocks. Her blood pressure has been under bettercontrol. She has a h/o PAFand has been noted to have increasingly frequent episodes despite medical therapy with dofetilide. She denies chest pain. She has required adjustment of her diuretic dose due to her propensity for volume overload.her EF had normalized by echo 2.5 years ago. She has never had VT. She was switched to a biv PPM at the time of her last gen change. Allergies  Allergen Reactions  . Adhesive [Tape] Other (See Comments)    Burning sensation  . Atorvastatin     Makes legs hurt   . Metformin And Related     "caused me kidney problems"   . Sulfonamide Derivatives Other (See Comments)    itch     Current Outpatient Medications  Medication Sig Dispense Refill  . acetaminophen (TYLENOL) 325 MG tablet Take 2 tablets (650 mg total) by mouth every 6 (six) hours as needed for mild pain or moderate pain.    Marland Kitchen albuterol (PROAIR HFA) 108 (90 Base) MCG/ACT inhaler Inhale 1-2 puffs into the lungs every 6 (six) hours as needed for wheezing or shortness of breath. 18 g 5  . ALPRAZolam (XANAX) 0.5 MG tablet Take 0.5 mg by mouth 2 (two) times daily as needed for anxiety or sleep.     Marland Kitchen amLODipine (NORVASC) 5 MG tablet Take 5 mg by mouth daily.    . Azelastine HCl 137 MCG/SPRAY SOLN Place 2 sprays into both nostrils at bedtime.    . calcium carbonate (TUMS EX) 750 MG chewable tablet Chew 2 tablets by mouth as needed for heartburn.    . carvedilol (COREG) 6.25 MG tablet Take 6.25 mg by mouth 2 (two) times daily with a meal.    . cholecalciferol (VITAMIN D3) 25 MCG (1000 UNIT) tablet Take 1,000 Units by mouth daily.    .  diphenhydrAMINE (BENADRYL) 25 MG tablet Take 12.5 mg by mouth daily as needed for itching.    . ezetimibe (ZETIA) 10 MG tablet Take 10 mg by mouth daily.    . furosemide (LASIX) 40 MG tablet Take 40 mg by mouth daily.    Marland Kitchen HYDROcodone-acetaminophen (NORCO/VICODIN) 5-325 MG tablet Take 0.5-1 tablets by mouth at bedtime.     Marland Kitchen HYDROcodone-homatropine (HYCODAN) 5-1.5 MG/5ML syrup Take 5 mLs by mouth every 6 (six) hours as needed for cough. 120 mL 0  . Insulin Glargine (BASAGLAR KWIKPEN) 100 UNIT/ML Inject 14 Units into the skin daily.    Marland Kitchen ipratropium-albuterol (DUONEB) 0.5-2.5 (3) MG/3ML SOLN Take 3 mLs by nebulization every 6 (six) hours as needed. 360 mL 2  . linagliptin (TRADJENTA) 5 MG TABS tablet Take 5 mg by mouth daily.    . magnesium gluconate (MAGONATE) 500 MG tablet Take 500 mg by mouth daily as needed (for leg cramps).     Marland Kitchen omeprazole (PRILOSEC) 40 MG capsule Take 40 mg by mouth daily.      . OXYGEN Inhale 1.5 L into the lungs at bedtime.     Marland Kitchen PARoxetine (PAXIL) 30 MG tablet Take 30 mg by mouth daily.      Marland Kitchen tiZANidine (ZANAFLEX) 2 MG tablet Take 2 mg by mouth at bedtime as needed  for muscle spasms.     . TRELEGY ELLIPTA 100-62.5-25 MCG/INH AEPB Inhale 1 puff into the lungs daily. 60 each 12  . warfarin (COUMADIN) 5 MG tablet Take 2.5-5 mg by mouth See admin instructions. Take 5 mg by mouth daily on Sunday and Thursday. Take 2.5 mg by mouth daily on all other days    . atorvastatin (LIPITOR) 10 MG tablet Take 0.5 tablets by mouth daily.     No current facility-administered medications for this visit.     Past Medical History:  Diagnosis Date  . Anemia   . Anxiety   . CAD (coronary artery disease)   . Chronic kidney disease    CKD stage 3   . COPD (chronic obstructive pulmonary disease) (HCC)    uses  1 to and1.5 L of o2 at bedtime ; has not seen her pulm doctor in over a year ; has not had to uses inhlaer in a over a year    . Diabetes mellitus    type II  . GERD  (gastroesophageal reflux disease)   . HTN (hypertension)    essential  . Hyperlipidemia, mixed   . Mitral valve disorder   . Osteoarthritis   . Panic attack   . Paroxysmal atrial fibrillation (HCC)   . Pneumonia    2010  . Pulmonary embolism ([place]) 2010  . Sleep apnea    does not uses cpap since she got a URI from it. has home 02 as needed    ROS:   All systems reviewed and negative except as noted in the HPI.   Past Surgical History:  Procedure Laterality Date  . BIOPSY BREAST    . BIV ICD GENERATOR CHANGEOUT N/A 06/06/2017   Procedure: BIV ICD GENERATOR CHANGEOUT;  Surgeon: Evans Lance, MD;  Location: [facility];  Service: Cardiovascular;  Laterality: N/A;  . CARDIAC VALVE SURGERY     mitral  . carpal tennel  2003  . EYE SURGERY  2015  or 2016   cataract surgery   . PARATHYROIDECTOMY Left 06/17/2017   Procedure: LEFT UPPER PARATHYROIDECTOMY;  Surgeon: Clovis Riley, MD;  Location: [facility];  Service: General;  Laterality: Left;  . TUBAL LIGATION      over 60 years ago      Family History  Problem Relation Age of Onset  . Stroke Mother   . Anuerysm Father   . Diabetes Sister   . Cancer Brother   . Ulcers Sister      Social History   Socioeconomic History  . Marital status: Widowed    Spouse name: Not on file  . Number of children: Not on file  . Years of education: Not on file  . Highest education level: Not on file  Occupational History  . Occupation: reitred  Tobacco Use  . Smoking status: Former Smoker    Packs/day: 1.00    Years: 50.00    Pack years: 50.00    Types: Cigarettes    Quit date: 02/19/2001    Years since quitting: 18.9  . Smokeless tobacco: Never Used  Vaping Use  . Vaping Use: Never used  Substance and Sexual Activity  . Alcohol use: No    Alcohol/week: 0.0 standard drinks  . Drug use: Never  . Sexual activity: Not on file  Other Topics Concern  . Not on file  Social History Narrative  . Not on file   Social  Determinants of Health   Financial Resource Strain:   .  Difficulty of Paying Living Expenses: Not on file  Food Insecurity:   . Worried About Charity fundraiser in the Last Year: Not on file  . Ran Out of Food in the Last Year: Not on file  Transportation Needs:   . Lack of Transportation (Medical): Not on file  . Lack of Transportation (Non-Medical): Not on file  Physical Activity:   . Days of Exercise per Week: Not on file  . Minutes of Exercise per Session: Not on file  Stress:   . Feeling of Stress : Not on file  Social Connections:   . Frequency of Communication with Friends and Family: Not on file  . Frequency of Social Gatherings with Friends and Family: Not on file  . Attends Religious Services: Not on file  . Active Member of Clubs or Organizations: Not on file  . Attends Archivist Meetings: Not on file  . Marital Status: Not on file  Intimate Partner Violence:   . Fear of Current or Ex-Partner: Not on file  . Emotionally Abused: Not on file  . Physically Abused: Not on file  . Sexually Abused: Not on file     BP 120/64   Pulse 77   Ht 5\' 6"  (1.676 m)   Wt 200 lb 9.6 oz (91 kg)   SpO2 97%   BMI 32.38 kg/m   Physical Exam:  Well appearing NAD HEENT: Unremarkable Neck:  No JVD, no thyromegally Lymphatics:  No adenopathy Back:  No CVA tenderness Lungs:  Clear with no wheezes HEART:  Regular rate rhythm, no murmurs, no rubs, no clicks Abd:  soft, positive bowel sounds, no organomegally, no rebound, no guarding Ext:  2 plus pulses, no edema, no cyanosis, no clubbing Skin:  No rashes no nodules Neuro:  CN II through XII intact, motor grossly intact  EKG  DEVICE  Normal device function.  See PaceArt for details.   Assess/Plan: 1. Atrial fib - she has been on dofetilide. 2. Atrial flutter - this is non-isthmus dependent. I have reviewed the treatment options. She is already of dofetilide and her lung function is not good for amiodarone. She will  undergo rate control. We will stop dofetilide. 3. PPM - her [employer] Biv PPM is working normally. We will recheck in several months 4. Chronic systolic heart failure - her symptoms are class 2. She will continue her current meds. Her fluid status is stable.

## 2020-01-26 NOTE — Patient Instructions (Signed)
Medication Instructions:  Your physician recommends that you continue on your current medications as directed. Please refer to the Current Medication list given to you today.  Labwork: None ordered.  Testing/Procedures: None ordered.  Follow-Up: Your physician wants you to follow-up in: one year with Dr. Lovena Le.   You will receive a reminder letter in the mail two months in advance. If you don't receive a letter, please call our office to schedule the follow-up appointment.  Remote monitoring is used to monitor your ICD from home. This monitoring reduces the number of office visits required to check your device to one time per year. It allows [place] to keep an eye on the functioning of your device to ensure it is working properly. You are scheduled for a device check from home on 04/01/2020. You may send your transmission at any time that day. If you have a wireless device, the transmission will be sent automatically. After your physician reviews your transmission, you will receive a postcard with your next transmission date.  Any Other Special Instructions Will Be Listed Below (If Applicable).  If you need a refill on your cardiac medications before your next appointment, please call your pharmacy.

## 2020-01-28 DIAGNOSIS — R69 Illness, unspecified: Secondary | ICD-10-CM | POA: Diagnosis not present

## 2020-01-28 NOTE — Addendum Note (Signed)
Addended by: Rose Phi on: 01/28/2020 11:05 AM   Modules accepted: Orders

## 2020-01-29 DIAGNOSIS — Z7901 Long term (current) use of anticoagulants: Secondary | ICD-10-CM | POA: Diagnosis not present

## 2020-02-18 DIAGNOSIS — E1121 Type 2 diabetes mellitus with diabetic nephropathy: Secondary | ICD-10-CM | POA: Diagnosis not present

## 2020-02-18 DIAGNOSIS — E785 Hyperlipidemia, unspecified: Secondary | ICD-10-CM | POA: Diagnosis not present

## 2020-02-18 DIAGNOSIS — I130 Hypertensive heart and chronic kidney disease with heart failure and stage 1 through stage 4 chronic kidney disease, or unspecified chronic kidney disease: Secondary | ICD-10-CM | POA: Diagnosis not present

## 2020-02-18 DIAGNOSIS — E1142 Type 2 diabetes mellitus with diabetic polyneuropathy: Secondary | ICD-10-CM | POA: Diagnosis not present

## 2020-02-18 DIAGNOSIS — D509 Iron deficiency anemia, unspecified: Secondary | ICD-10-CM | POA: Diagnosis not present

## 2020-02-18 DIAGNOSIS — J449 Chronic obstructive pulmonary disease, unspecified: Secondary | ICD-10-CM | POA: Diagnosis not present

## 2020-02-18 DIAGNOSIS — J441 Chronic obstructive pulmonary disease with (acute) exacerbation: Secondary | ICD-10-CM | POA: Diagnosis not present

## 2020-02-18 DIAGNOSIS — I480 Paroxysmal atrial fibrillation: Secondary | ICD-10-CM | POA: Diagnosis not present

## 2020-02-18 DIAGNOSIS — R69 Illness, unspecified: Secondary | ICD-10-CM | POA: Diagnosis not present

## 2020-02-18 DIAGNOSIS — E1165 Type 2 diabetes mellitus with hyperglycemia: Secondary | ICD-10-CM | POA: Diagnosis not present

## 2020-02-19 DIAGNOSIS — E1121 Type 2 diabetes mellitus with diabetic nephropathy: Secondary | ICD-10-CM | POA: Diagnosis not present

## 2020-02-25 DIAGNOSIS — J438 Other emphysema: Secondary | ICD-10-CM | POA: Diagnosis not present

## 2020-02-26 DIAGNOSIS — I10 Essential (primary) hypertension: Secondary | ICD-10-CM | POA: Diagnosis not present

## 2020-02-26 DIAGNOSIS — I480 Paroxysmal atrial fibrillation: Secondary | ICD-10-CM | POA: Diagnosis not present

## 2020-02-26 DIAGNOSIS — J441 Chronic obstructive pulmonary disease with (acute) exacerbation: Secondary | ICD-10-CM | POA: Diagnosis not present

## 2020-02-26 DIAGNOSIS — K219 Gastro-esophageal reflux disease without esophagitis: Secondary | ICD-10-CM | POA: Diagnosis not present

## 2020-02-26 DIAGNOSIS — E1121 Type 2 diabetes mellitus with diabetic nephropathy: Secondary | ICD-10-CM | POA: Diagnosis not present

## 2020-02-26 DIAGNOSIS — I130 Hypertensive heart and chronic kidney disease with heart failure and stage 1 through stage 4 chronic kidney disease, or unspecified chronic kidney disease: Secondary | ICD-10-CM | POA: Diagnosis not present

## 2020-02-26 DIAGNOSIS — E785 Hyperlipidemia, unspecified: Secondary | ICD-10-CM | POA: Diagnosis not present

## 2020-02-26 DIAGNOSIS — J449 Chronic obstructive pulmonary disease, unspecified: Secondary | ICD-10-CM | POA: Diagnosis not present

## 2020-02-26 DIAGNOSIS — E1142 Type 2 diabetes mellitus with diabetic polyneuropathy: Secondary | ICD-10-CM | POA: Diagnosis not present

## 2020-02-26 DIAGNOSIS — E1165 Type 2 diabetes mellitus with hyperglycemia: Secondary | ICD-10-CM | POA: Diagnosis not present

## 2020-03-18 DIAGNOSIS — J441 Chronic obstructive pulmonary disease with (acute) exacerbation: Secondary | ICD-10-CM | POA: Diagnosis not present

## 2020-03-22 DIAGNOSIS — U071 COVID-19: Secondary | ICD-10-CM | POA: Diagnosis not present

## 2020-03-22 DIAGNOSIS — R059 Cough, unspecified: Secondary | ICD-10-CM | POA: Diagnosis not present

## 2020-03-22 DIAGNOSIS — R0981 Nasal congestion: Secondary | ICD-10-CM | POA: Diagnosis not present

## 2020-03-27 DIAGNOSIS — J438 Other emphysema: Secondary | ICD-10-CM | POA: Diagnosis not present

## 2020-03-31 DIAGNOSIS — N183 Chronic kidney disease, stage 3 unspecified: Secondary | ICD-10-CM | POA: Diagnosis not present

## 2020-03-31 DIAGNOSIS — E785 Hyperlipidemia, unspecified: Secondary | ICD-10-CM | POA: Diagnosis not present

## 2020-03-31 DIAGNOSIS — D509 Iron deficiency anemia, unspecified: Secondary | ICD-10-CM | POA: Diagnosis not present

## 2020-03-31 DIAGNOSIS — E1165 Type 2 diabetes mellitus with hyperglycemia: Secondary | ICD-10-CM | POA: Diagnosis not present

## 2020-03-31 DIAGNOSIS — J449 Chronic obstructive pulmonary disease, unspecified: Secondary | ICD-10-CM | POA: Diagnosis not present

## 2020-03-31 DIAGNOSIS — I480 Paroxysmal atrial fibrillation: Secondary | ICD-10-CM | POA: Diagnosis not present

## 2020-03-31 DIAGNOSIS — K219 Gastro-esophageal reflux disease without esophagitis: Secondary | ICD-10-CM | POA: Diagnosis not present

## 2020-03-31 DIAGNOSIS — R69 Illness, unspecified: Secondary | ICD-10-CM | POA: Diagnosis not present

## 2020-03-31 DIAGNOSIS — M170 Bilateral primary osteoarthritis of knee: Secondary | ICD-10-CM | POA: Diagnosis not present

## 2020-03-31 DIAGNOSIS — I130 Hypertensive heart and chronic kidney disease with heart failure and stage 1 through stage 4 chronic kidney disease, or unspecified chronic kidney disease: Secondary | ICD-10-CM | POA: Diagnosis not present

## 2020-04-01 ENCOUNTER — Ambulatory Visit (INDEPENDENT_AMBULATORY_CARE_PROVIDER_SITE_OTHER): Payer: Medicare HMO

## 2020-04-01 DIAGNOSIS — I428 Other cardiomyopathies: Secondary | ICD-10-CM

## 2020-04-01 DIAGNOSIS — I5022 Chronic systolic (congestive) heart failure: Secondary | ICD-10-CM | POA: Diagnosis not present

## 2020-04-03 LAB — CUP PACEART REMOTE DEVICE CHECK
Battery Remaining Longevity: 95 mo
Battery Remaining Percentage: 95.5 %
Battery Voltage: 2.98 V
Brady Statistic AP VP Percent: 23 %
Brady Statistic AP VS Percent: 1 %
Brady Statistic AS VP Percent: 74 %
Brady Statistic AS VS Percent: 1.5 %
Brady Statistic RA Percent Paced: 15 %
Date Time Interrogation Session: 20220211040015
Implantable Lead Implant Date: 20110127
Implantable Lead Implant Date: 20110127
Implantable Lead Implant Date: 20110127
Implantable Lead Location: 753858
Implantable Lead Location: 753859
Implantable Lead Location: 753860
Implantable Lead Model: 7122
Implantable Pulse Generator Implant Date: 20190418
Lead Channel Impedance Value: 1025 Ohm
Lead Channel Impedance Value: 410 Ohm
Lead Channel Impedance Value: 560 Ohm
Lead Channel Pacing Threshold Amplitude: 0.625 V
Lead Channel Pacing Threshold Amplitude: 0.75 V
Lead Channel Pacing Threshold Amplitude: 1 V
Lead Channel Pacing Threshold Pulse Width: 0.5 ms
Lead Channel Pacing Threshold Pulse Width: 0.5 ms
Lead Channel Pacing Threshold Pulse Width: 0.6 ms
Lead Channel Sensing Intrinsic Amplitude: 12 mV
Lead Channel Sensing Intrinsic Amplitude: 2.4 mV
Lead Channel Setting Pacing Amplitude: 2 V
Lead Channel Setting Pacing Amplitude: 2 V
Lead Channel Setting Pacing Amplitude: 2.5 V
Lead Channel Setting Pacing Pulse Width: 0.5 ms
Lead Channel Setting Pacing Pulse Width: 0.6 ms
Lead Channel Setting Sensing Sensitivity: 2 mV
Pulse Gen Model: 3222
Pulse Gen Serial Number: 9393517

## 2020-04-06 NOTE — Progress Notes (Signed)
Remote pacemaker transmission.   

## 2020-04-12 DIAGNOSIS — Z7901 Long term (current) use of anticoagulants: Secondary | ICD-10-CM | POA: Diagnosis not present

## 2020-04-12 DIAGNOSIS — N183 Chronic kidney disease, stage 3 unspecified: Secondary | ICD-10-CM | POA: Diagnosis not present

## 2020-04-20 DIAGNOSIS — E785 Hyperlipidemia, unspecified: Secondary | ICD-10-CM | POA: Diagnosis not present

## 2020-04-20 DIAGNOSIS — E1165 Type 2 diabetes mellitus with hyperglycemia: Secondary | ICD-10-CM | POA: Diagnosis not present

## 2020-04-20 DIAGNOSIS — I480 Paroxysmal atrial fibrillation: Secondary | ICD-10-CM | POA: Diagnosis not present

## 2020-04-20 DIAGNOSIS — E1121 Type 2 diabetes mellitus with diabetic nephropathy: Secondary | ICD-10-CM | POA: Diagnosis not present

## 2020-04-20 DIAGNOSIS — J449 Chronic obstructive pulmonary disease, unspecified: Secondary | ICD-10-CM | POA: Diagnosis not present

## 2020-04-20 DIAGNOSIS — D509 Iron deficiency anemia, unspecified: Secondary | ICD-10-CM | POA: Diagnosis not present

## 2020-04-20 DIAGNOSIS — K219 Gastro-esophageal reflux disease without esophagitis: Secondary | ICD-10-CM | POA: Diagnosis not present

## 2020-04-20 DIAGNOSIS — R69 Illness, unspecified: Secondary | ICD-10-CM | POA: Diagnosis not present

## 2020-04-20 DIAGNOSIS — N183 Chronic kidney disease, stage 3 unspecified: Secondary | ICD-10-CM | POA: Diagnosis not present

## 2020-04-20 DIAGNOSIS — I130 Hypertensive heart and chronic kidney disease with heart failure and stage 1 through stage 4 chronic kidney disease, or unspecified chronic kidney disease: Secondary | ICD-10-CM | POA: Diagnosis not present

## 2020-04-24 DIAGNOSIS — J438 Other emphysema: Secondary | ICD-10-CM | POA: Diagnosis not present

## 2020-05-11 DIAGNOSIS — D72829 Elevated white blood cell count, unspecified: Secondary | ICD-10-CM | POA: Diagnosis not present

## 2020-05-11 DIAGNOSIS — J449 Chronic obstructive pulmonary disease, unspecified: Secondary | ICD-10-CM | POA: Diagnosis not present

## 2020-05-11 DIAGNOSIS — E1142 Type 2 diabetes mellitus with diabetic polyneuropathy: Secondary | ICD-10-CM | POA: Diagnosis not present

## 2020-05-11 DIAGNOSIS — I130 Hypertensive heart and chronic kidney disease with heart failure and stage 1 through stage 4 chronic kidney disease, or unspecified chronic kidney disease: Secondary | ICD-10-CM | POA: Diagnosis not present

## 2020-05-11 DIAGNOSIS — M170 Bilateral primary osteoarthritis of knee: Secondary | ICD-10-CM | POA: Diagnosis not present

## 2020-05-11 DIAGNOSIS — J9611 Chronic respiratory failure with hypoxia: Secondary | ICD-10-CM | POA: Diagnosis not present

## 2020-05-11 DIAGNOSIS — E1121 Type 2 diabetes mellitus with diabetic nephropathy: Secondary | ICD-10-CM | POA: Diagnosis not present

## 2020-05-11 DIAGNOSIS — I480 Paroxysmal atrial fibrillation: Secondary | ICD-10-CM | POA: Diagnosis not present

## 2020-05-11 DIAGNOSIS — I429 Cardiomyopathy, unspecified: Secondary | ICD-10-CM | POA: Diagnosis not present

## 2020-05-11 DIAGNOSIS — E113299 Type 2 diabetes mellitus with mild nonproliferative diabetic retinopathy without macular edema, unspecified eye: Secondary | ICD-10-CM | POA: Diagnosis not present

## 2020-05-25 DIAGNOSIS — J438 Other emphysema: Secondary | ICD-10-CM | POA: Diagnosis not present

## 2020-06-07 DIAGNOSIS — M170 Bilateral primary osteoarthritis of knee: Secondary | ICD-10-CM | POA: Diagnosis not present

## 2020-06-17 DIAGNOSIS — E1121 Type 2 diabetes mellitus with diabetic nephropathy: Secondary | ICD-10-CM | POA: Diagnosis not present

## 2020-06-24 DIAGNOSIS — J438 Other emphysema: Secondary | ICD-10-CM | POA: Diagnosis not present

## 2020-06-28 DIAGNOSIS — E113299 Type 2 diabetes mellitus with mild nonproliferative diabetic retinopathy without macular edema, unspecified eye: Secondary | ICD-10-CM | POA: Diagnosis not present

## 2020-06-28 DIAGNOSIS — Z7901 Long term (current) use of anticoagulants: Secondary | ICD-10-CM | POA: Diagnosis not present

## 2020-06-28 DIAGNOSIS — Z7984 Long term (current) use of oral hypoglycemic drugs: Secondary | ICD-10-CM | POA: Diagnosis not present

## 2020-06-28 DIAGNOSIS — N183 Chronic kidney disease, stage 3 unspecified: Secondary | ICD-10-CM | POA: Diagnosis not present

## 2020-07-01 ENCOUNTER — Ambulatory Visit (INDEPENDENT_AMBULATORY_CARE_PROVIDER_SITE_OTHER): Payer: Medicare HMO

## 2020-07-01 DIAGNOSIS — I5022 Chronic systolic (congestive) heart failure: Secondary | ICD-10-CM | POA: Diagnosis not present

## 2020-07-01 DIAGNOSIS — I428 Other cardiomyopathies: Secondary | ICD-10-CM

## 2020-07-01 LAB — CUP PACEART REMOTE DEVICE CHECK
Battery Remaining Longevity: 96 mo
Battery Remaining Percentage: 95.5 %
Battery Voltage: 2.98 V
Brady Statistic AP VP Percent: 23 %
Brady Statistic AP VS Percent: 1 %
Brady Statistic AS VP Percent: 74 %
Brady Statistic AS VS Percent: 1.5 %
Brady Statistic RA Percent Paced: 6.8 %
Date Time Interrogation Session: 20220513053336
Implantable Lead Implant Date: 20110127
Implantable Lead Implant Date: 20110127
Implantable Lead Implant Date: 20110127
Implantable Lead Location: 753858
Implantable Lead Location: 753859
Implantable Lead Location: 753860
Implantable Lead Model: 7122
Implantable Pulse Generator Implant Date: 20190418
Lead Channel Impedance Value: 380 Ohm
Lead Channel Impedance Value: 560 Ohm
Lead Channel Impedance Value: 910 Ohm
Lead Channel Pacing Threshold Amplitude: 0.625 V
Lead Channel Pacing Threshold Amplitude: 0.75 V
Lead Channel Pacing Threshold Amplitude: 1 V
Lead Channel Pacing Threshold Pulse Width: 0.5 ms
Lead Channel Pacing Threshold Pulse Width: 0.5 ms
Lead Channel Pacing Threshold Pulse Width: 0.6 ms
Lead Channel Sensing Intrinsic Amplitude: 12 mV
Lead Channel Sensing Intrinsic Amplitude: 2.4 mV
Lead Channel Setting Pacing Amplitude: 2 V
Lead Channel Setting Pacing Amplitude: 2 V
Lead Channel Setting Pacing Amplitude: 2.5 V
Lead Channel Setting Pacing Pulse Width: 0.5 ms
Lead Channel Setting Pacing Pulse Width: 0.6 ms
Lead Channel Setting Sensing Sensitivity: 2 mV
Pulse Gen Model: 3222
Pulse Gen Serial Number: 9393517

## 2020-07-14 DIAGNOSIS — D509 Iron deficiency anemia, unspecified: Secondary | ICD-10-CM | POA: Diagnosis not present

## 2020-07-14 DIAGNOSIS — J449 Chronic obstructive pulmonary disease, unspecified: Secondary | ICD-10-CM | POA: Diagnosis not present

## 2020-07-14 DIAGNOSIS — E1121 Type 2 diabetes mellitus with diabetic nephropathy: Secondary | ICD-10-CM | POA: Diagnosis not present

## 2020-07-14 DIAGNOSIS — I10 Essential (primary) hypertension: Secondary | ICD-10-CM | POA: Diagnosis not present

## 2020-07-14 DIAGNOSIS — E785 Hyperlipidemia, unspecified: Secondary | ICD-10-CM | POA: Diagnosis not present

## 2020-07-14 DIAGNOSIS — J441 Chronic obstructive pulmonary disease with (acute) exacerbation: Secondary | ICD-10-CM | POA: Diagnosis not present

## 2020-07-14 DIAGNOSIS — I480 Paroxysmal atrial fibrillation: Secondary | ICD-10-CM | POA: Diagnosis not present

## 2020-07-14 DIAGNOSIS — N183 Chronic kidney disease, stage 3 unspecified: Secondary | ICD-10-CM | POA: Diagnosis not present

## 2020-07-14 DIAGNOSIS — E1142 Type 2 diabetes mellitus with diabetic polyneuropathy: Secondary | ICD-10-CM | POA: Diagnosis not present

## 2020-07-14 DIAGNOSIS — I130 Hypertensive heart and chronic kidney disease with heart failure and stage 1 through stage 4 chronic kidney disease, or unspecified chronic kidney disease: Secondary | ICD-10-CM | POA: Diagnosis not present

## 2020-07-14 NOTE — Progress Notes (Signed)
Remote pacemaker transmission.   

## 2020-07-19 DIAGNOSIS — E1121 Type 2 diabetes mellitus with diabetic nephropathy: Secondary | ICD-10-CM | POA: Diagnosis not present

## 2020-07-22 DIAGNOSIS — E119 Type 2 diabetes mellitus without complications: Secondary | ICD-10-CM | POA: Diagnosis not present

## 2020-07-22 DIAGNOSIS — H52203 Unspecified astigmatism, bilateral: Secondary | ICD-10-CM | POA: Diagnosis not present

## 2020-07-25 DIAGNOSIS — J438 Other emphysema: Secondary | ICD-10-CM | POA: Diagnosis not present

## 2020-07-28 DIAGNOSIS — M5136 Other intervertebral disc degeneration, lumbar region: Secondary | ICD-10-CM | POA: Diagnosis not present

## 2020-07-28 DIAGNOSIS — R059 Cough, unspecified: Secondary | ICD-10-CM | POA: Diagnosis not present

## 2020-07-28 DIAGNOSIS — Z7901 Long term (current) use of anticoagulants: Secondary | ICD-10-CM | POA: Diagnosis not present

## 2020-07-28 DIAGNOSIS — J40 Bronchitis, not specified as acute or chronic: Secondary | ICD-10-CM | POA: Diagnosis not present

## 2020-07-28 DIAGNOSIS — L989 Disorder of the skin and subcutaneous tissue, unspecified: Secondary | ICD-10-CM | POA: Diagnosis not present

## 2020-07-28 DIAGNOSIS — Z03818 Encounter for observation for suspected exposure to other biological agents ruled out: Secondary | ICD-10-CM | POA: Diagnosis not present

## 2020-07-28 DIAGNOSIS — J441 Chronic obstructive pulmonary disease with (acute) exacerbation: Secondary | ICD-10-CM | POA: Diagnosis not present

## 2020-07-28 DIAGNOSIS — R519 Headache, unspecified: Secondary | ICD-10-CM | POA: Diagnosis not present

## 2020-07-28 DIAGNOSIS — R42 Dizziness and giddiness: Secondary | ICD-10-CM | POA: Diagnosis not present

## 2020-08-16 DIAGNOSIS — M170 Bilateral primary osteoarthritis of knee: Secondary | ICD-10-CM | POA: Diagnosis not present

## 2020-08-16 DIAGNOSIS — R69 Illness, unspecified: Secondary | ICD-10-CM | POA: Diagnosis not present

## 2020-08-16 DIAGNOSIS — I480 Paroxysmal atrial fibrillation: Secondary | ICD-10-CM | POA: Diagnosis not present

## 2020-08-16 DIAGNOSIS — E785 Hyperlipidemia, unspecified: Secondary | ICD-10-CM | POA: Diagnosis not present

## 2020-08-16 DIAGNOSIS — N183 Chronic kidney disease, stage 3 unspecified: Secondary | ICD-10-CM | POA: Diagnosis not present

## 2020-08-16 DIAGNOSIS — J449 Chronic obstructive pulmonary disease, unspecified: Secondary | ICD-10-CM | POA: Diagnosis not present

## 2020-08-16 DIAGNOSIS — I130 Hypertensive heart and chronic kidney disease with heart failure and stage 1 through stage 4 chronic kidney disease, or unspecified chronic kidney disease: Secondary | ICD-10-CM | POA: Diagnosis not present

## 2020-08-16 DIAGNOSIS — K219 Gastro-esophageal reflux disease without esophagitis: Secondary | ICD-10-CM | POA: Diagnosis not present

## 2020-08-16 DIAGNOSIS — E1165 Type 2 diabetes mellitus with hyperglycemia: Secondary | ICD-10-CM | POA: Diagnosis not present

## 2020-08-16 DIAGNOSIS — D509 Iron deficiency anemia, unspecified: Secondary | ICD-10-CM | POA: Diagnosis not present

## 2020-08-24 DIAGNOSIS — J438 Other emphysema: Secondary | ICD-10-CM | POA: Diagnosis not present

## 2020-09-16 DIAGNOSIS — D509 Iron deficiency anemia, unspecified: Secondary | ICD-10-CM | POA: Diagnosis not present

## 2020-09-16 DIAGNOSIS — I480 Paroxysmal atrial fibrillation: Secondary | ICD-10-CM | POA: Diagnosis not present

## 2020-09-16 DIAGNOSIS — E1121 Type 2 diabetes mellitus with diabetic nephropathy: Secondary | ICD-10-CM | POA: Diagnosis not present

## 2020-09-16 DIAGNOSIS — E1142 Type 2 diabetes mellitus with diabetic polyneuropathy: Secondary | ICD-10-CM | POA: Diagnosis not present

## 2020-09-16 DIAGNOSIS — R69 Illness, unspecified: Secondary | ICD-10-CM | POA: Diagnosis not present

## 2020-09-16 DIAGNOSIS — K219 Gastro-esophageal reflux disease without esophagitis: Secondary | ICD-10-CM | POA: Diagnosis not present

## 2020-09-16 DIAGNOSIS — E113299 Type 2 diabetes mellitus with mild nonproliferative diabetic retinopathy without macular edema, unspecified eye: Secondary | ICD-10-CM | POA: Diagnosis not present

## 2020-09-16 DIAGNOSIS — I130 Hypertensive heart and chronic kidney disease with heart failure and stage 1 through stage 4 chronic kidney disease, or unspecified chronic kidney disease: Secondary | ICD-10-CM | POA: Diagnosis not present

## 2020-09-16 DIAGNOSIS — E1165 Type 2 diabetes mellitus with hyperglycemia: Secondary | ICD-10-CM | POA: Diagnosis not present

## 2020-09-16 DIAGNOSIS — E785 Hyperlipidemia, unspecified: Secondary | ICD-10-CM | POA: Diagnosis not present

## 2020-09-16 DIAGNOSIS — J449 Chronic obstructive pulmonary disease, unspecified: Secondary | ICD-10-CM | POA: Diagnosis not present

## 2020-09-24 DIAGNOSIS — J438 Other emphysema: Secondary | ICD-10-CM | POA: Diagnosis not present

## 2020-09-30 ENCOUNTER — Ambulatory Visit (INDEPENDENT_AMBULATORY_CARE_PROVIDER_SITE_OTHER): Payer: Medicare HMO

## 2020-09-30 DIAGNOSIS — I5022 Chronic systolic (congestive) heart failure: Secondary | ICD-10-CM | POA: Diagnosis not present

## 2020-09-30 LAB — CUP PACEART REMOTE DEVICE CHECK
Battery Remaining Longevity: 56 mo
Battery Remaining Percentage: 60 %
Battery Voltage: 2.98 V
Brady Statistic AP VP Percent: 23 %
Brady Statistic AP VS Percent: 1 %
Brady Statistic AS VP Percent: 74 %
Brady Statistic AS VS Percent: 1.5 %
Brady Statistic RA Percent Paced: 4.6 %
Date Time Interrogation Session: 20220812040015
Implantable Lead Implant Date: 20110127
Implantable Lead Implant Date: 20110127
Implantable Lead Implant Date: 20110127
Implantable Lead Location: 753858
Implantable Lead Location: 753859
Implantable Lead Location: 753860
Implantable Lead Model: 7122
Implantable Pulse Generator Implant Date: 20190418
Lead Channel Impedance Value: 380 Ohm
Lead Channel Impedance Value: 590 Ohm
Lead Channel Impedance Value: 910 Ohm
Lead Channel Pacing Threshold Amplitude: 0.75 V
Lead Channel Pacing Threshold Amplitude: 1 V
Lead Channel Pacing Threshold Amplitude: 1.125 V
Lead Channel Pacing Threshold Pulse Width: 0.5 ms
Lead Channel Pacing Threshold Pulse Width: 0.5 ms
Lead Channel Pacing Threshold Pulse Width: 0.6 ms
Lead Channel Sensing Intrinsic Amplitude: 0.5 mV
Lead Channel Sensing Intrinsic Amplitude: 12 mV
Lead Channel Setting Pacing Amplitude: 2 V
Lead Channel Setting Pacing Amplitude: 2.125
Lead Channel Setting Pacing Amplitude: 2.5 V
Lead Channel Setting Pacing Pulse Width: 0.5 ms
Lead Channel Setting Pacing Pulse Width: 0.6 ms
Lead Channel Setting Sensing Sensitivity: 2 mV
Pulse Gen Model: 3222
Pulse Gen Serial Number: 9393517

## 2020-10-13 DIAGNOSIS — J45909 Unspecified asthma, uncomplicated: Secondary | ICD-10-CM | POA: Diagnosis not present

## 2020-10-19 DIAGNOSIS — E1165 Type 2 diabetes mellitus with hyperglycemia: Secondary | ICD-10-CM | POA: Diagnosis not present

## 2020-10-19 NOTE — Progress Notes (Signed)
Remote pacemaker transmission.   

## 2020-10-25 DIAGNOSIS — J438 Other emphysema: Secondary | ICD-10-CM | POA: Diagnosis not present

## 2020-10-26 DIAGNOSIS — I480 Paroxysmal atrial fibrillation: Secondary | ICD-10-CM | POA: Diagnosis not present

## 2020-10-26 DIAGNOSIS — Z23 Encounter for immunization: Secondary | ICD-10-CM | POA: Diagnosis not present

## 2020-10-26 DIAGNOSIS — E785 Hyperlipidemia, unspecified: Secondary | ICD-10-CM | POA: Diagnosis not present

## 2020-10-26 DIAGNOSIS — J9611 Chronic respiratory failure with hypoxia: Secondary | ICD-10-CM | POA: Diagnosis not present

## 2020-10-26 DIAGNOSIS — Z7984 Long term (current) use of oral hypoglycemic drugs: Secondary | ICD-10-CM | POA: Diagnosis not present

## 2020-10-26 DIAGNOSIS — E1142 Type 2 diabetes mellitus with diabetic polyneuropathy: Secondary | ICD-10-CM | POA: Diagnosis not present

## 2020-10-26 DIAGNOSIS — J449 Chronic obstructive pulmonary disease, unspecified: Secondary | ICD-10-CM | POA: Diagnosis not present

## 2020-10-26 DIAGNOSIS — E113299 Type 2 diabetes mellitus with mild nonproliferative diabetic retinopathy without macular edema, unspecified eye: Secondary | ICD-10-CM | POA: Diagnosis not present

## 2020-10-26 DIAGNOSIS — Z7901 Long term (current) use of anticoagulants: Secondary | ICD-10-CM | POA: Diagnosis not present

## 2020-10-26 DIAGNOSIS — I130 Hypertensive heart and chronic kidney disease with heart failure and stage 1 through stage 4 chronic kidney disease, or unspecified chronic kidney disease: Secondary | ICD-10-CM | POA: Diagnosis not present

## 2020-10-26 DIAGNOSIS — I429 Cardiomyopathy, unspecified: Secondary | ICD-10-CM | POA: Diagnosis not present

## 2020-10-26 DIAGNOSIS — E1121 Type 2 diabetes mellitus with diabetic nephropathy: Secondary | ICD-10-CM | POA: Diagnosis not present

## 2020-11-01 DIAGNOSIS — M170 Bilateral primary osteoarthritis of knee: Secondary | ICD-10-CM | POA: Diagnosis not present

## 2020-11-02 DIAGNOSIS — E1165 Type 2 diabetes mellitus with hyperglycemia: Secondary | ICD-10-CM | POA: Diagnosis not present

## 2020-11-02 DIAGNOSIS — N183 Chronic kidney disease, stage 3 unspecified: Secondary | ICD-10-CM | POA: Diagnosis not present

## 2020-11-02 DIAGNOSIS — J441 Chronic obstructive pulmonary disease with (acute) exacerbation: Secondary | ICD-10-CM | POA: Diagnosis not present

## 2020-11-02 DIAGNOSIS — I130 Hypertensive heart and chronic kidney disease with heart failure and stage 1 through stage 4 chronic kidney disease, or unspecified chronic kidney disease: Secondary | ICD-10-CM | POA: Diagnosis not present

## 2020-11-02 DIAGNOSIS — R69 Illness, unspecified: Secondary | ICD-10-CM | POA: Diagnosis not present

## 2020-11-02 DIAGNOSIS — I480 Paroxysmal atrial fibrillation: Secondary | ICD-10-CM | POA: Diagnosis not present

## 2020-11-02 DIAGNOSIS — E785 Hyperlipidemia, unspecified: Secondary | ICD-10-CM | POA: Diagnosis not present

## 2020-11-02 DIAGNOSIS — E1142 Type 2 diabetes mellitus with diabetic polyneuropathy: Secondary | ICD-10-CM | POA: Diagnosis not present

## 2020-11-02 DIAGNOSIS — I10 Essential (primary) hypertension: Secondary | ICD-10-CM | POA: Diagnosis not present

## 2020-11-02 DIAGNOSIS — E1121 Type 2 diabetes mellitus with diabetic nephropathy: Secondary | ICD-10-CM | POA: Diagnosis not present

## 2020-11-24 DIAGNOSIS — J438 Other emphysema: Secondary | ICD-10-CM | POA: Diagnosis not present

## 2020-12-05 DIAGNOSIS — B349 Viral infection, unspecified: Secondary | ICD-10-CM | POA: Diagnosis not present

## 2020-12-05 DIAGNOSIS — H1011 Acute atopic conjunctivitis, right eye: Secondary | ICD-10-CM | POA: Diagnosis not present

## 2020-12-16 DIAGNOSIS — E1165 Type 2 diabetes mellitus with hyperglycemia: Secondary | ICD-10-CM | POA: Diagnosis not present

## 2020-12-25 DIAGNOSIS — J438 Other emphysema: Secondary | ICD-10-CM | POA: Diagnosis not present

## 2020-12-30 ENCOUNTER — Ambulatory Visit (INDEPENDENT_AMBULATORY_CARE_PROVIDER_SITE_OTHER): Payer: Medicare HMO

## 2020-12-30 DIAGNOSIS — I428 Other cardiomyopathies: Secondary | ICD-10-CM

## 2020-12-30 LAB — CUP PACEART REMOTE DEVICE CHECK
Battery Remaining Longevity: 53 mo
Battery Remaining Percentage: 57 %
Battery Voltage: 2.98 V
Brady Statistic AP VP Percent: 23 %
Brady Statistic AP VS Percent: 1 %
Brady Statistic AS VP Percent: 74 %
Brady Statistic AS VS Percent: 1.5 %
Brady Statistic RA Percent Paced: 3.5 %
Date Time Interrogation Session: 20221111035709
Implantable Lead Implant Date: 20110127
Implantable Lead Implant Date: 20110127
Implantable Lead Implant Date: 20110127
Implantable Lead Location: 753858
Implantable Lead Location: 753859
Implantable Lead Location: 753860
Implantable Lead Model: 7122
Implantable Pulse Generator Implant Date: 20190418
Lead Channel Impedance Value: 380 Ohm
Lead Channel Impedance Value: 540 Ohm
Lead Channel Impedance Value: 930 Ohm
Lead Channel Pacing Threshold Amplitude: 0.75 V
Lead Channel Pacing Threshold Amplitude: 0.875 V
Lead Channel Pacing Threshold Amplitude: 1 V
Lead Channel Pacing Threshold Pulse Width: 0.5 ms
Lead Channel Pacing Threshold Pulse Width: 0.5 ms
Lead Channel Pacing Threshold Pulse Width: 0.6 ms
Lead Channel Sensing Intrinsic Amplitude: 0.5 mV
Lead Channel Sensing Intrinsic Amplitude: 12 mV
Lead Channel Setting Pacing Amplitude: 2 V
Lead Channel Setting Pacing Amplitude: 2 V
Lead Channel Setting Pacing Amplitude: 2.5 V
Lead Channel Setting Pacing Pulse Width: 0.5 ms
Lead Channel Setting Pacing Pulse Width: 0.6 ms
Lead Channel Setting Sensing Sensitivity: 2 mV
Pulse Gen Model: 3222
Pulse Gen Serial Number: 9393517

## 2021-01-09 NOTE — Progress Notes (Signed)
Remote pacemaker transmission.   

## 2021-01-16 DIAGNOSIS — J441 Chronic obstructive pulmonary disease with (acute) exacerbation: Secondary | ICD-10-CM | POA: Diagnosis not present

## 2021-01-16 DIAGNOSIS — N183 Chronic kidney disease, stage 3 unspecified: Secondary | ICD-10-CM | POA: Diagnosis not present

## 2021-01-16 DIAGNOSIS — E1165 Type 2 diabetes mellitus with hyperglycemia: Secondary | ICD-10-CM | POA: Diagnosis not present

## 2021-01-16 DIAGNOSIS — I480 Paroxysmal atrial fibrillation: Secondary | ICD-10-CM | POA: Diagnosis not present

## 2021-01-16 DIAGNOSIS — I10 Essential (primary) hypertension: Secondary | ICD-10-CM | POA: Diagnosis not present

## 2021-01-16 DIAGNOSIS — I130 Hypertensive heart and chronic kidney disease with heart failure and stage 1 through stage 4 chronic kidney disease, or unspecified chronic kidney disease: Secondary | ICD-10-CM | POA: Diagnosis not present

## 2021-01-16 DIAGNOSIS — E1142 Type 2 diabetes mellitus with diabetic polyneuropathy: Secondary | ICD-10-CM | POA: Diagnosis not present

## 2021-01-16 DIAGNOSIS — E1121 Type 2 diabetes mellitus with diabetic nephropathy: Secondary | ICD-10-CM | POA: Diagnosis not present

## 2021-01-16 DIAGNOSIS — R69 Illness, unspecified: Secondary | ICD-10-CM | POA: Diagnosis not present

## 2021-01-16 DIAGNOSIS — E785 Hyperlipidemia, unspecified: Secondary | ICD-10-CM | POA: Diagnosis not present

## 2021-01-18 DIAGNOSIS — M25562 Pain in left knee: Secondary | ICD-10-CM | POA: Diagnosis not present

## 2021-01-18 DIAGNOSIS — M25561 Pain in right knee: Secondary | ICD-10-CM | POA: Diagnosis not present

## 2021-01-18 DIAGNOSIS — M170 Bilateral primary osteoarthritis of knee: Secondary | ICD-10-CM | POA: Diagnosis not present

## 2021-01-18 DIAGNOSIS — E1121 Type 2 diabetes mellitus with diabetic nephropathy: Secondary | ICD-10-CM | POA: Diagnosis not present

## 2021-01-24 DIAGNOSIS — J438 Other emphysema: Secondary | ICD-10-CM | POA: Diagnosis not present

## 2021-02-15 DIAGNOSIS — I10 Essential (primary) hypertension: Secondary | ICD-10-CM | POA: Diagnosis not present

## 2021-02-15 DIAGNOSIS — D6869 Other thrombophilia: Secondary | ICD-10-CM | POA: Diagnosis not present

## 2021-02-15 DIAGNOSIS — M170 Bilateral primary osteoarthritis of knee: Secondary | ICD-10-CM | POA: Diagnosis not present

## 2021-02-15 DIAGNOSIS — N183 Chronic kidney disease, stage 3 unspecified: Secondary | ICD-10-CM | POA: Diagnosis not present

## 2021-02-15 DIAGNOSIS — I429 Cardiomyopathy, unspecified: Secondary | ICD-10-CM | POA: Diagnosis not present

## 2021-02-15 DIAGNOSIS — Z0000 Encounter for general adult medical examination without abnormal findings: Secondary | ICD-10-CM | POA: Diagnosis not present

## 2021-02-15 DIAGNOSIS — E1121 Type 2 diabetes mellitus with diabetic nephropathy: Secondary | ICD-10-CM | POA: Diagnosis not present

## 2021-02-15 DIAGNOSIS — E785 Hyperlipidemia, unspecified: Secondary | ICD-10-CM | POA: Diagnosis not present

## 2021-02-15 DIAGNOSIS — I480 Paroxysmal atrial fibrillation: Secondary | ICD-10-CM | POA: Diagnosis not present

## 2021-02-15 DIAGNOSIS — Z7901 Long term (current) use of anticoagulants: Secondary | ICD-10-CM | POA: Diagnosis not present

## 2021-02-24 DIAGNOSIS — J438 Other emphysema: Secondary | ICD-10-CM | POA: Diagnosis not present

## 2021-02-24 DIAGNOSIS — J441 Chronic obstructive pulmonary disease with (acute) exacerbation: Secondary | ICD-10-CM | POA: Diagnosis not present

## 2021-03-08 DIAGNOSIS — E1165 Type 2 diabetes mellitus with hyperglycemia: Secondary | ICD-10-CM | POA: Diagnosis not present

## 2021-03-08 DIAGNOSIS — I130 Hypertensive heart and chronic kidney disease with heart failure and stage 1 through stage 4 chronic kidney disease, or unspecified chronic kidney disease: Secondary | ICD-10-CM | POA: Diagnosis not present

## 2021-03-08 DIAGNOSIS — J441 Chronic obstructive pulmonary disease with (acute) exacerbation: Secondary | ICD-10-CM | POA: Diagnosis not present

## 2021-03-08 DIAGNOSIS — E1121 Type 2 diabetes mellitus with diabetic nephropathy: Secondary | ICD-10-CM | POA: Diagnosis not present

## 2021-03-08 DIAGNOSIS — R69 Illness, unspecified: Secondary | ICD-10-CM | POA: Diagnosis not present

## 2021-03-08 DIAGNOSIS — I10 Essential (primary) hypertension: Secondary | ICD-10-CM | POA: Diagnosis not present

## 2021-03-08 DIAGNOSIS — J449 Chronic obstructive pulmonary disease, unspecified: Secondary | ICD-10-CM | POA: Diagnosis not present

## 2021-03-08 DIAGNOSIS — E785 Hyperlipidemia, unspecified: Secondary | ICD-10-CM | POA: Diagnosis not present

## 2021-03-08 DIAGNOSIS — E1142 Type 2 diabetes mellitus with diabetic polyneuropathy: Secondary | ICD-10-CM | POA: Diagnosis not present

## 2021-03-08 DIAGNOSIS — I480 Paroxysmal atrial fibrillation: Secondary | ICD-10-CM | POA: Diagnosis not present

## 2021-03-08 DIAGNOSIS — N183 Chronic kidney disease, stage 3 unspecified: Secondary | ICD-10-CM | POA: Diagnosis not present

## 2021-03-08 DIAGNOSIS — Z7901 Long term (current) use of anticoagulants: Secondary | ICD-10-CM | POA: Diagnosis not present

## 2021-03-08 DIAGNOSIS — J0110 Acute frontal sinusitis, unspecified: Secondary | ICD-10-CM | POA: Diagnosis not present

## 2021-03-17 DIAGNOSIS — Z7901 Long term (current) use of anticoagulants: Secondary | ICD-10-CM | POA: Diagnosis not present

## 2021-03-27 DIAGNOSIS — J438 Other emphysema: Secondary | ICD-10-CM | POA: Diagnosis not present

## 2021-03-31 ENCOUNTER — Ambulatory Visit (INDEPENDENT_AMBULATORY_CARE_PROVIDER_SITE_OTHER): Payer: Medicare HMO

## 2021-03-31 DIAGNOSIS — I428 Other cardiomyopathies: Secondary | ICD-10-CM | POA: Diagnosis not present

## 2021-04-01 LAB — CUP PACEART REMOTE DEVICE CHECK
Battery Remaining Longevity: 53 mo
Battery Remaining Percentage: 54 %
Battery Voltage: 2.98 V
Brady Statistic AP VP Percent: 23 %
Brady Statistic AP VS Percent: 1 %
Brady Statistic AS VP Percent: 74 %
Brady Statistic AS VS Percent: 1.5 %
Brady Statistic RA Percent Paced: 2.9 %
Date Time Interrogation Session: 20230210043703
Implantable Lead Implant Date: 20110127
Implantable Lead Implant Date: 20110127
Implantable Lead Implant Date: 20110127
Implantable Lead Location: 753858
Implantable Lead Location: 753859
Implantable Lead Location: 753860
Implantable Lead Model: 7122
Implantable Pulse Generator Implant Date: 20190418
Lead Channel Impedance Value: 400 Ohm
Lead Channel Impedance Value: 600 Ohm
Lead Channel Impedance Value: 960 Ohm
Lead Channel Pacing Threshold Amplitude: 0.625 V
Lead Channel Pacing Threshold Amplitude: 0.75 V
Lead Channel Pacing Threshold Amplitude: 1 V
Lead Channel Pacing Threshold Pulse Width: 0.5 ms
Lead Channel Pacing Threshold Pulse Width: 0.5 ms
Lead Channel Pacing Threshold Pulse Width: 0.6 ms
Lead Channel Sensing Intrinsic Amplitude: 0.5 mV
Lead Channel Sensing Intrinsic Amplitude: 12 mV
Lead Channel Setting Pacing Amplitude: 2 V
Lead Channel Setting Pacing Amplitude: 2 V
Lead Channel Setting Pacing Amplitude: 2.5 V
Lead Channel Setting Pacing Pulse Width: 0.5 ms
Lead Channel Setting Pacing Pulse Width: 0.6 ms
Lead Channel Setting Sensing Sensitivity: 2 mV
Pulse Gen Model: 3222
Pulse Gen Serial Number: 9393517

## 2021-04-04 NOTE — Progress Notes (Signed)
Remote pacemaker transmission.   

## 2021-04-14 IMAGING — CR DG CHEST 2V
2 series · 2 of 2 positions shown · non-contrast
Comparison: February 13, 2018

CLINICAL DATA: Chest pain

EXAM:
CHEST - 2 VIEW

[chest pa]
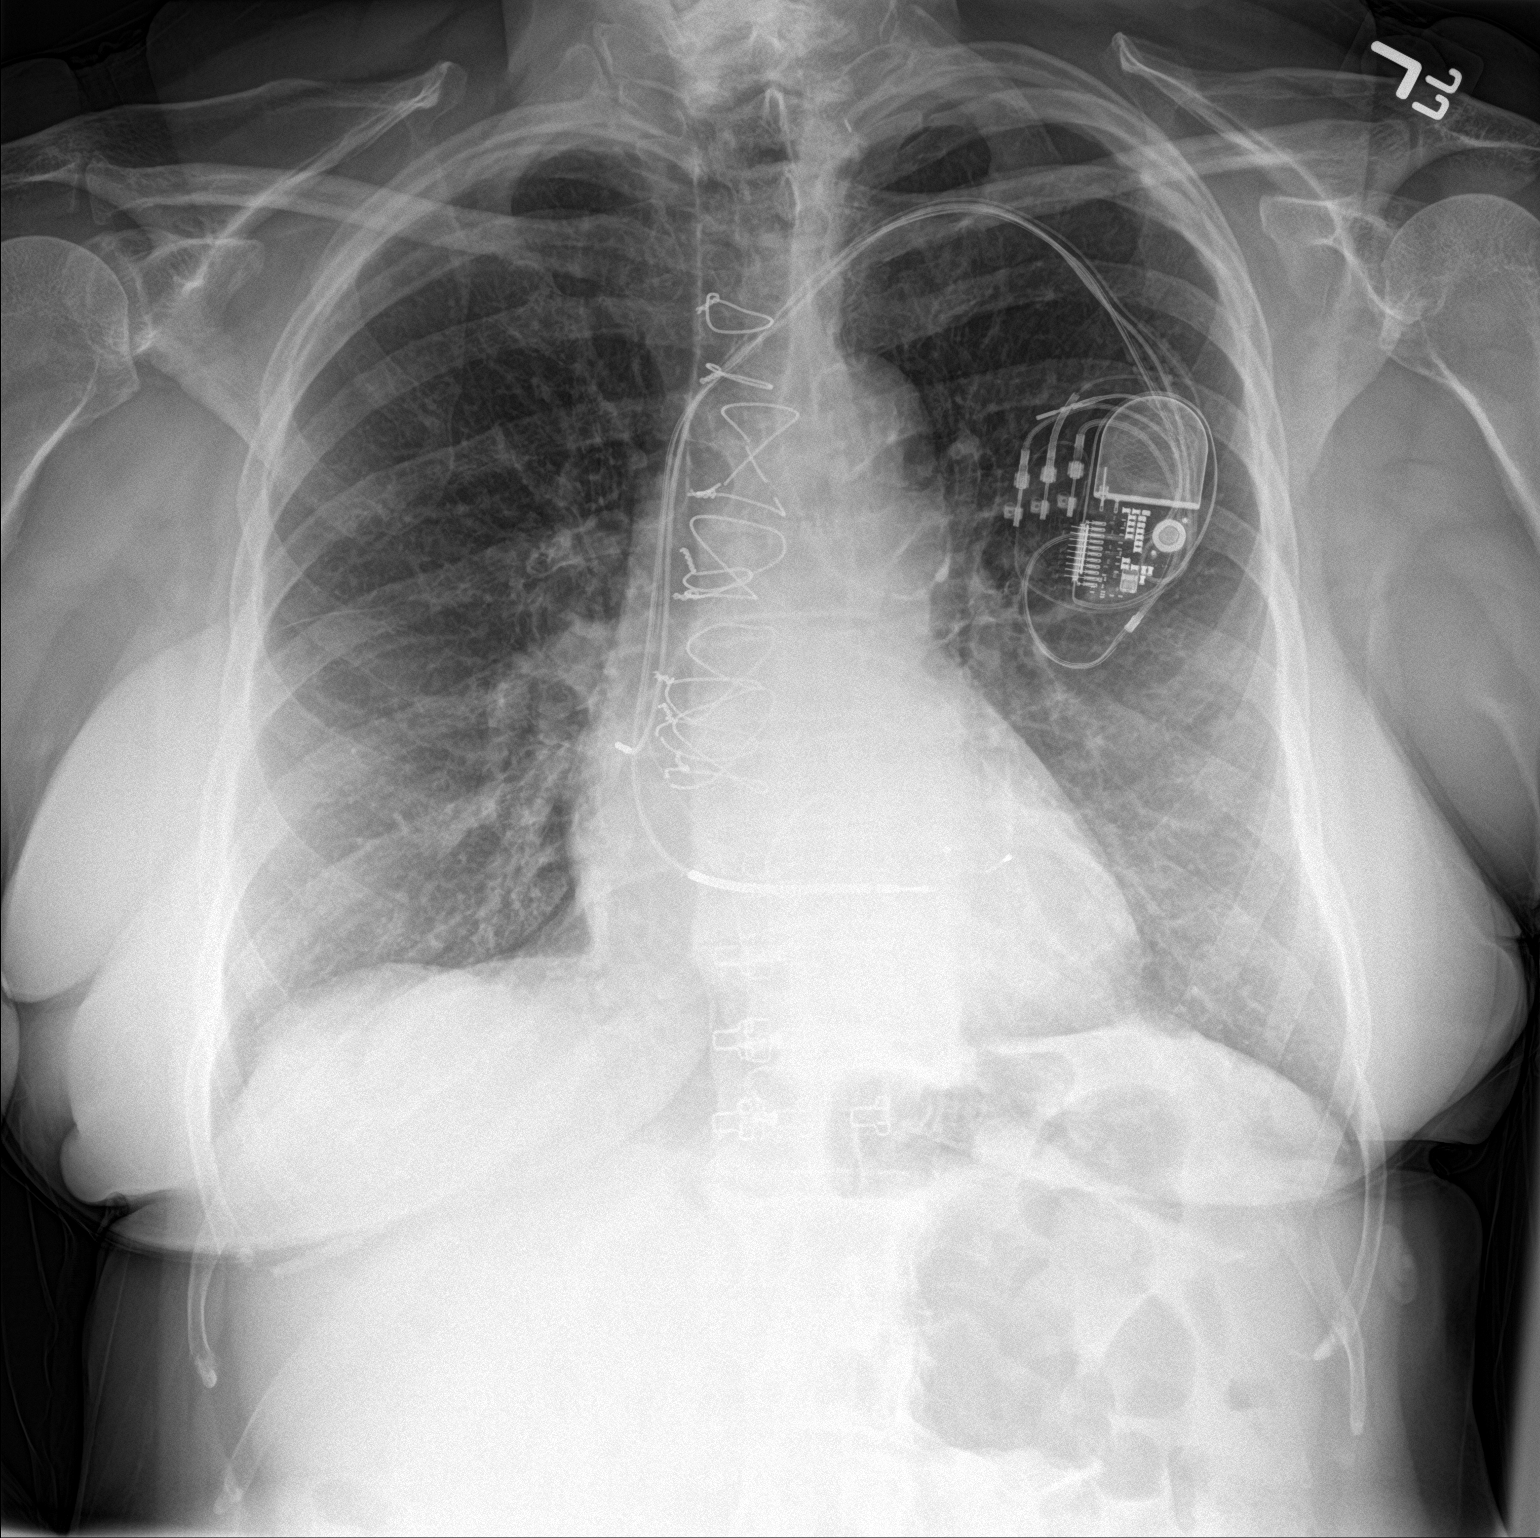

[chest lat]
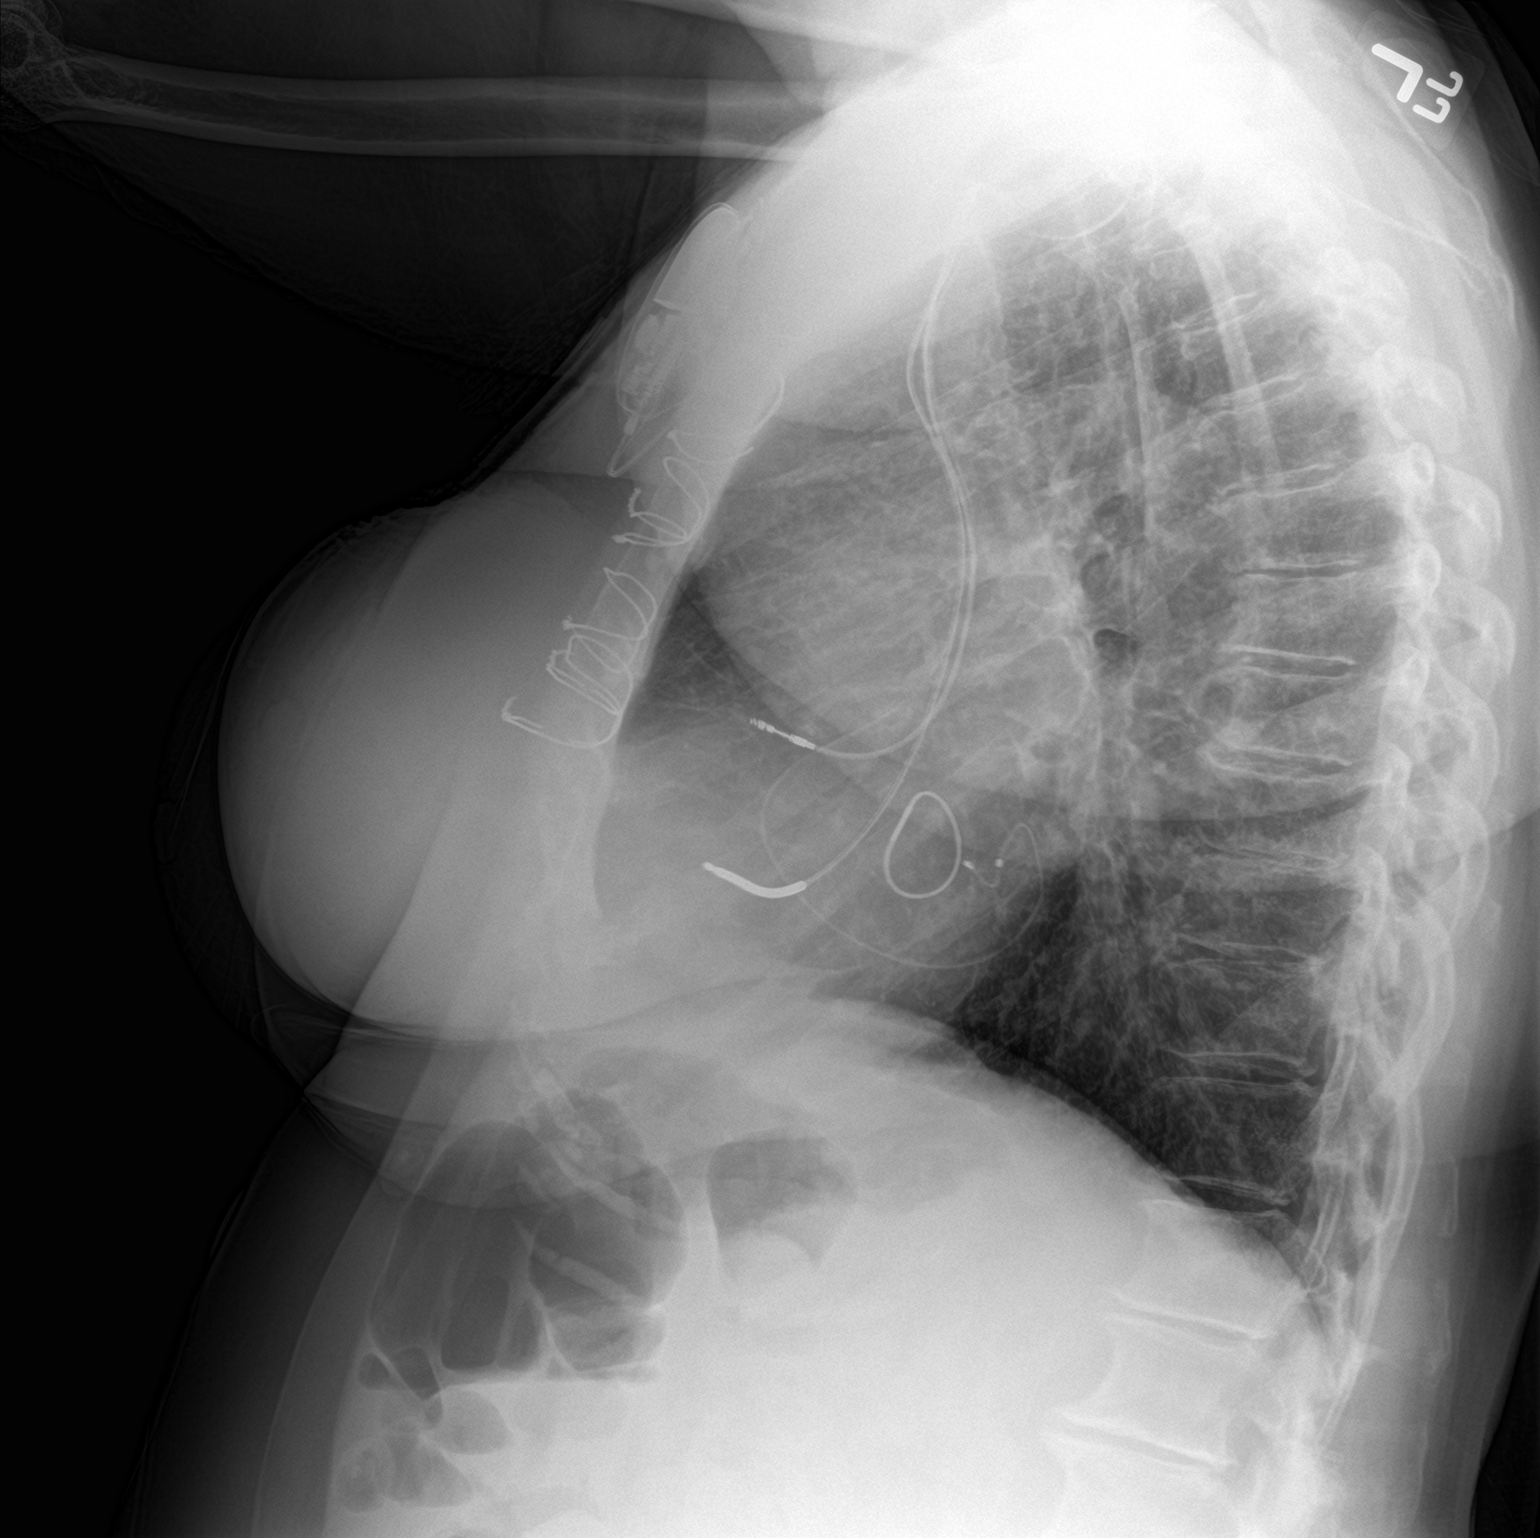

[2 of 2 positions shown; findings below may reference images not displayed]

FINDINGS: The lungs are clear. Heart size and pulmonary vascularity are
normal. No adenopathy. Patient is status post mitral valve
replacement. Pacemaker leads are attached the right atrium, right
ventricle, and coronary sinus. No adenopathy. There is aortic
atherosclerosis. No pneumothorax. There is mild degenerative change
in the thoracic spine.
IMPRESSION: No edema or airspace opacity. Cardiac silhouette within normal
limits. Postoperative changes noted. Aortic Atherosclerosis
([id number]).

## 2021-04-24 DIAGNOSIS — J438 Other emphysema: Secondary | ICD-10-CM | POA: Diagnosis not present

## 2021-05-09 DIAGNOSIS — J209 Acute bronchitis, unspecified: Secondary | ICD-10-CM | POA: Diagnosis not present

## 2021-05-09 DIAGNOSIS — Z7901 Long term (current) use of anticoagulants: Secondary | ICD-10-CM | POA: Diagnosis not present

## 2021-05-09 DIAGNOSIS — J441 Chronic obstructive pulmonary disease with (acute) exacerbation: Secondary | ICD-10-CM | POA: Diagnosis not present

## 2021-05-16 DIAGNOSIS — E785 Hyperlipidemia, unspecified: Secondary | ICD-10-CM | POA: Diagnosis not present

## 2021-05-16 DIAGNOSIS — E1121 Type 2 diabetes mellitus with diabetic nephropathy: Secondary | ICD-10-CM | POA: Diagnosis not present

## 2021-05-16 DIAGNOSIS — J441 Chronic obstructive pulmonary disease with (acute) exacerbation: Secondary | ICD-10-CM | POA: Diagnosis not present

## 2021-05-16 DIAGNOSIS — I10 Essential (primary) hypertension: Secondary | ICD-10-CM | POA: Diagnosis not present

## 2021-05-25 DIAGNOSIS — J438 Other emphysema: Secondary | ICD-10-CM | POA: Diagnosis not present

## 2021-05-30 ENCOUNTER — Ambulatory Visit: Payer: Medicare HMO | Admitting: Pulmonary Disease

## 2021-06-08 DIAGNOSIS — I130 Hypertensive heart and chronic kidney disease with heart failure and stage 1 through stage 4 chronic kidney disease, or unspecified chronic kidney disease: Secondary | ICD-10-CM | POA: Diagnosis not present

## 2021-06-08 DIAGNOSIS — E785 Hyperlipidemia, unspecified: Secondary | ICD-10-CM | POA: Diagnosis not present

## 2021-06-08 DIAGNOSIS — E1121 Type 2 diabetes mellitus with diabetic nephropathy: Secondary | ICD-10-CM | POA: Diagnosis not present

## 2021-06-08 DIAGNOSIS — I10 Essential (primary) hypertension: Secondary | ICD-10-CM | POA: Diagnosis not present

## 2021-06-15 DIAGNOSIS — E1121 Type 2 diabetes mellitus with diabetic nephropathy: Secondary | ICD-10-CM | POA: Diagnosis not present

## 2021-06-15 DIAGNOSIS — F3341 Major depressive disorder, recurrent, in partial remission: Secondary | ICD-10-CM | POA: Diagnosis not present

## 2021-06-15 DIAGNOSIS — N183 Chronic kidney disease, stage 3 unspecified: Secondary | ICD-10-CM | POA: Diagnosis not present

## 2021-06-15 DIAGNOSIS — J449 Chronic obstructive pulmonary disease, unspecified: Secondary | ICD-10-CM | POA: Diagnosis not present

## 2021-06-15 DIAGNOSIS — I480 Paroxysmal atrial fibrillation: Secondary | ICD-10-CM | POA: Diagnosis not present

## 2021-06-15 DIAGNOSIS — Z7901 Long term (current) use of anticoagulants: Secondary | ICD-10-CM | POA: Diagnosis not present

## 2021-06-15 DIAGNOSIS — E785 Hyperlipidemia, unspecified: Secondary | ICD-10-CM | POA: Diagnosis not present

## 2021-06-15 DIAGNOSIS — Z9981 Dependence on supplemental oxygen: Secondary | ICD-10-CM | POA: Diagnosis not present

## 2021-06-15 DIAGNOSIS — I429 Cardiomyopathy, unspecified: Secondary | ICD-10-CM | POA: Diagnosis not present

## 2021-06-15 DIAGNOSIS — D6869 Other thrombophilia: Secondary | ICD-10-CM | POA: Diagnosis not present

## 2021-06-15 DIAGNOSIS — G894 Chronic pain syndrome: Secondary | ICD-10-CM | POA: Diagnosis not present

## 2021-06-15 DIAGNOSIS — J9611 Chronic respiratory failure with hypoxia: Secondary | ICD-10-CM | POA: Diagnosis not present

## 2021-06-15 DIAGNOSIS — I10 Essential (primary) hypertension: Secondary | ICD-10-CM | POA: Diagnosis not present

## 2021-06-21 ENCOUNTER — Ambulatory Visit: Payer: Medicare HMO | Admitting: Pulmonary Disease

## 2021-06-21 ENCOUNTER — Encounter: Payer: Self-pay | Admitting: Pulmonary Disease

## 2021-06-21 VITALS — BP 120/76 | HR 81 | Temp 98.1°F | Ht 65.0 in | Wt 202.0 lb

## 2021-06-21 DIAGNOSIS — R0609 Other forms of dyspnea: Secondary | ICD-10-CM | POA: Diagnosis not present

## 2021-06-21 MED ORDER — TRELEGY ELLIPTA 200-62.5-25 MCG/ACT IN AEPB: 1.0000 | INHALATION_SPRAY | Freq: Every day | RESPIRATORY_TRACT | 6 refills | Status: DC

## 2021-06-21 NOTE — Progress Notes (Signed)
? ?      ?Emily Abbott    [id number]    1940-11-04 ? ?Primary Care Physician:White, Caren Griffins, MD ? ?Referring Physician: Harlan Stains, MD ?[address] ?[address] ?[place],  [place] [address] ? ?Chief complaint:   ?History of COPD ?Some worsening of her breathing recently ? ?HPI: ? ?Shortness of breath on exertion ?History of COPD ?History of lung nodule ? ?Shortness of breath is worse than the last couple of months ?Continues to use inhalers on a regular basis but feels more short of breath ? ?Has had to use 3 courses of antibiotics this year continues Trelegy 100 on a daily basis ? ?Has nasal stuffiness and congestion, cough with no sputum production ?Not feeling acutely ill ?Uses Flonase for nasal stuffiness ? ?History of atrial fibrillation, currently in atrial flutter ?History of mitral valve repair ? ?History of cardiomyopathy with recent echo showing normal ejection fraction, suboptimal to comment on pulmonary hypertension ? ?Pulmonary function study from 2017 revealed very mild obstructive disease ? ? ?Outpatient Encounter Medications as of 06/21/2021  ?Medication Sig  ? acetaminophen (TYLENOL) 325 MG tablet Take 2 tablets (650 mg total) by mouth every 6 (six) hours as needed for mild pain or moderate pain.  ? albuterol (PROAIR HFA) 108 (90 Base) MCG/ACT inhaler Inhale 1-2 puffs into the lungs every 6 (six) hours as needed for wheezing or shortness of breath.  ? ALPRAZolam (XANAX) 0.5 MG tablet Take 0.5 mg by mouth 2 (two) times daily as needed for anxiety or sleep.   ? amLODipine (NORVASC) 5 MG tablet Take 5 mg by mouth daily.  ? atorvastatin (LIPITOR) 10 MG tablet Take 0.5 tablets by mouth daily.  ? Azelastine HCl 137 MCG/SPRAY SOLN Place 2 sprays into both nostrils at bedtime.  ? calcium carbonate (TUMS EX) 750 MG chewable tablet Chew 2 tablets by mouth as needed for heartburn.  ? carvedilol (COREG) 6.25 MG tablet Take 6.25 mg by mouth 2 (two) times daily with a meal.  ? cholecalciferol (VITAMIN  D3) 25 MCG (1000 UNIT) tablet Take 1,000 Units by mouth daily.  ? diphenhydrAMINE (BENADRYL) 25 MG tablet Take 12.5 mg by mouth daily as needed for itching.  ? ezetimibe (ZETIA) 10 MG tablet Take 10 mg by mouth daily.  ? furosemide (LASIX) 40 MG tablet Take 40 mg by mouth daily.  ? HYDROcodone-acetaminophen (NORCO/VICODIN) 5-325 MG tablet Take 0.5-1 tablets by mouth at bedtime.  ? Insulin Glargine (BASAGLAR KWIKPEN) 100 UNIT/ML Inject 14 Units into the skin daily.  ? ipratropium-albuterol (DUONEB) 0.5-2.5 (3) MG/3ML SOLN Take 3 mLs by nebulization every 6 (six) hours as needed.  ? linagliptin (TRADJENTA) 5 MG TABS tablet Take 5 mg by mouth daily.  ? magnesium gluconate (MAGONATE) 500 MG tablet Take 500 mg by mouth daily as needed (for leg cramps).   ? omeprazole (PRILOSEC) 40 MG capsule Take 40 mg by mouth daily.    ? OXYGEN Inhale 1.5 L into the lungs at bedtime.   ? PARoxetine (PAXIL) 30 MG tablet Take 30 mg by mouth daily.    ? tiZANidine (ZANAFLEX) 2 MG tablet Take 2 mg by mouth at bedtime as needed for muscle spasms.   ? TRELEGY ELLIPTA 100-62.5-25 MCG/INH AEPB Inhale 1 puff into the lungs daily.  ? warfarin (COUMADIN) 5 MG tablet Take 2.5-5 mg by mouth See admin instructions. Take 5 mg by mouth daily on Sunday and Thursday. Take 2.5 mg by mouth daily on all other days  ? [DISCONTINUED] HYDROcodone-homatropine (  HYCODAN) 5-1.5 MG/5ML syrup Take 5 mLs by mouth every 6 (six) hours as needed for cough. (Patient not taking: Reported on 06/21/2021)  ? ?No facility-administered encounter medications on file as of 06/21/2021.  ? ? ?Allergies as of 06/21/2021 - Review Complete 06/21/2021  ?Allergen Reaction Noted  ? Metformin and related Other (See Comments) 06/14/2017  ? Adhesive [tape] Other (See Comments) 06/04/2017  ? Atorvastatin Other (See Comments) 06/17/2017  ? Sulfonamide derivatives Other (See Comments)   ? ? ?Past Medical History:  ?Diagnosis Date  ? Anemia   ? Anxiety   ? CAD (coronary artery disease)   ?  Chronic kidney disease   ? CKD stage 3   ? COPD (chronic obstructive pulmonary disease) ([place])   ? uses  1 to and1.5 L of o2 at bedtime ; has not seen her pulm doctor in over a year ; has not had to uses inhlaer in a over a year    ? Diabetes mellitus   ? type II  ? GERD (gastroesophageal reflux disease)   ? HTN (hypertension)   ? essential  ? Hyperlipidemia, mixed   ? Mitral valve disorder   ? Osteoarthritis   ? Panic attack   ? Paroxysmal atrial fibrillation (HCC)   ? Pneumonia   ? 2010  ? Pulmonary embolism ([place]) 2010  ? Sleep apnea   ? does not uses cpap since she got a URI from it. has home 02 as needed  ? ? ?Past Surgical History:  ?Procedure Laterality Date  ? BIOPSY BREAST    ? BIV ICD GENERATOR CHANGEOUT N/A 06/06/2017  ? Procedure: BIV ICD GENERATOR CHANGEOUT;  Surgeon: Evans Lance, MD;  Location: [facility];  Service: Cardiovascular;  Laterality: N/A;  ? CARDIAC VALVE SURGERY    ? mitral  ? carpal tennel  2003  ? EYE SURGERY  2015  or 2016  ? cataract surgery   ? PARATHYROIDECTOMY Left 06/17/2017  ? Procedure: LEFT UPPER PARATHYROIDECTOMY;  Surgeon: Clovis Riley, MD;  Location: [facility];  Service: General;  Laterality: Left;  ? TUBAL LIGATION    ?  over 60 years ago   ? ? ?Family History  ?Problem Relation Age of Onset  ? Stroke Mother   ? Anuerysm Father   ? Diabetes Sister   ? Cancer Brother   ? Ulcers Sister   ? ? ?Social History  ? ?Socioeconomic History  ? Marital status: Widowed  ?  Spouse name: Not on file  ? Number of children: Not on file  ? Years of education: Not on file  ? Highest education level: Not on file  ?Occupational History  ? Occupation: reitred  ?Tobacco Use  ? Smoking status: Former  ?  Packs/day: 1.00  ?  Years: 50.00  ?  Pack years: 50.00  ?  Types: Cigarettes  ?  Quit date: 02/19/2001  ?  Years since quitting: 20.3  ? Smokeless tobacco: Never  ?Vaping Use  ? Vaping Use: Never used  ?Substance and Sexual Activity  ? Alcohol use: No  ?  Alcohol/week: 0.0 standard drinks   ? Drug use: Never  ? Sexual activity: Not on file  ?Other Topics Concern  ? Not on file  ?Social History Narrative  ? Not on file  ? ?Social Determinants of Health  ? ?Financial Resource Strain: Not on file  ?Food Insecurity: Not on file  ?Transportation Needs: Not on file  ?Physical Activity: Not on file  ?Stress: Not on  file  ?Social Connections: Not on file  ?Intimate Partner Violence: Not on file  ? ? ?Review of Systems  ?Respiratory:  Positive for cough and shortness of breath.   ?Cardiovascular:  Negative for chest pain and palpitations.  ?Skin: Negative.   ?Psychiatric/Behavioral: Negative.  Negative for sleep disturbance.   ?All other systems reviewed and are negative. ? ?Vitals:  ? 06/21/21 1332  ?BP: 120/76  ?Pulse: 81  ?Temp: 98.1 ?F (36.7 ?C)  ?SpO2: 97%  ? ? ? ?Physical Exam ?Constitutional:   ?   Appearance: She is obese.  ?HENT:  ?   Head: Normocephalic and atraumatic.  ?Eyes:  ?   General:     ?   Right eye: No discharge.     ?   Left eye: No discharge.  ?Cardiovascular:  ?   Pulses: Normal pulses.  ?   Heart sounds: Normal heart sounds. No murmur heard. ?  No friction rub.  ?Pulmonary:  ?   Effort: Pulmonary effort is normal. No respiratory distress.  ?   Breath sounds: Normal breath sounds. No stridor. No wheezing or rhonchi.  ?Musculoskeletal:     ?   General: Normal range of motion.  ?   Cervical back: No rigidity or tenderness.  ?Skin: ?   General: Skin is warm.  ?Neurological:  ?   General: No focal deficit present.  ?   Mental Status: She is alert.  ?Psychiatric:     ?   Mood and Affect: Mood normal.  ? ?Data Reviewed: ?CT scan of the chest reviewed with the patient showing dominant right upper lobe nodule, peripheral right middle lobe nodule, left lower lobe nodule all unchanged from previous ?Previous CT also reviewed ? ?Echocardiogram from 2021 shows normal ejection fraction 55 to 60%, normal right ventricular size, right ventricular systolic function is normal ?Suboptimal for any mention  of pulmonary hypertension ? ?PFT seen-mild obstructive disease ? ?Assessment:  ?Chronic obstructive pulmonary disease ?-With worsening symptoms recently and recent use of antibiotics ?-We will step up inhaler

## 2021-06-21 NOTE — Patient Instructions (Signed)
Increase to Trelegy 200 ? ?Obtain pulmonary function test at next visit ? ?Echocardiogram for shortness of breath on exertion ? ?Regular exercises as tolerated ? ?I will see you in 2 to 3 months ? ?Assess for portable oxygen concentrator with a walk today ? ? ? ?We can consider adding Daliresp if breathing study is worsening ?

## 2021-06-24 DIAGNOSIS — J438 Other emphysema: Secondary | ICD-10-CM | POA: Diagnosis not present

## 2021-06-30 ENCOUNTER — Ambulatory Visit (INDEPENDENT_AMBULATORY_CARE_PROVIDER_SITE_OTHER): Payer: Medicare HMO

## 2021-06-30 DIAGNOSIS — I428 Other cardiomyopathies: Secondary | ICD-10-CM

## 2021-06-30 DIAGNOSIS — I5022 Chronic systolic (congestive) heart failure: Secondary | ICD-10-CM

## 2021-06-30 LAB — CUP PACEART REMOTE DEVICE CHECK
Battery Remaining Longevity: 47 mo
Battery Remaining Percentage: 50 %
Battery Voltage: 2.98 V
Brady Statistic AP VP Percent: 23 %
Brady Statistic AP VS Percent: 1 %
Brady Statistic AS VP Percent: 74 %
Brady Statistic AS VS Percent: 1.5 %
Brady Statistic RA Percent Paced: 2.8 %
Date Time Interrogation Session: 20230512062058
Implantable Lead Implant Date: 20110127
Implantable Lead Implant Date: 20110127
Implantable Lead Implant Date: 20110127
Implantable Lead Location: 753858
Implantable Lead Location: 753859
Implantable Lead Location: 753860
Implantable Lead Model: 7122
Implantable Pulse Generator Implant Date: 20190418
Lead Channel Impedance Value: 400 Ohm
Lead Channel Impedance Value: 560 Ohm
Lead Channel Impedance Value: 930 Ohm
Lead Channel Pacing Threshold Amplitude: 0.75 V
Lead Channel Pacing Threshold Amplitude: 1 V
Lead Channel Pacing Threshold Amplitude: 1.125 V
Lead Channel Pacing Threshold Pulse Width: 0.5 ms
Lead Channel Pacing Threshold Pulse Width: 0.5 ms
Lead Channel Pacing Threshold Pulse Width: 0.6 ms
Lead Channel Sensing Intrinsic Amplitude: 0.5 mV
Lead Channel Sensing Intrinsic Amplitude: 12 mV
Lead Channel Setting Pacing Amplitude: 2 V
Lead Channel Setting Pacing Amplitude: 2.125
Lead Channel Setting Pacing Amplitude: 2.5 V
Lead Channel Setting Pacing Pulse Width: 0.5 ms
Lead Channel Setting Pacing Pulse Width: 0.6 ms
Lead Channel Setting Sensing Sensitivity: 2 mV
Pulse Gen Model: 3222
Pulse Gen Serial Number: 9393517

## 2021-07-06 ENCOUNTER — Ambulatory Visit (HOSPITAL_COMMUNITY): Payer: Medicare HMO | Attending: Cardiovascular Disease

## 2021-07-06 DIAGNOSIS — R0609 Other forms of dyspnea: Secondary | ICD-10-CM | POA: Diagnosis not present

## 2021-07-06 NOTE — Progress Notes (Signed)
Remote pacemaker transmission.   

## 2021-07-08 LAB — ECHOCARDIOGRAM COMPLETE
AR max vel: 1.42 cm2
AV Area VTI: 1.38 cm2
AV Area mean vel: 1.26 cm2
AV Mean grad: 10 mmHg
AV Peak grad: 15.7 mmHg
Ao pk vel: 1.98 m/s
S' Lateral: 2.6 cm

## 2021-07-13 ENCOUNTER — Telehealth: Payer: Self-pay | Admitting: Pulmonary Disease

## 2021-07-13 DIAGNOSIS — K219 Gastro-esophageal reflux disease without esophagitis: Secondary | ICD-10-CM | POA: Diagnosis not present

## 2021-07-13 DIAGNOSIS — J449 Chronic obstructive pulmonary disease, unspecified: Secondary | ICD-10-CM | POA: Diagnosis not present

## 2021-07-13 DIAGNOSIS — I10 Essential (primary) hypertension: Secondary | ICD-10-CM | POA: Diagnosis not present

## 2021-07-13 DIAGNOSIS — I5022 Chronic systolic (congestive) heart failure: Secondary | ICD-10-CM | POA: Diagnosis not present

## 2021-07-13 DIAGNOSIS — E1121 Type 2 diabetes mellitus with diabetic nephropathy: Secondary | ICD-10-CM | POA: Diagnosis not present

## 2021-07-13 NOTE — Telephone Encounter (Signed)
Called patient and she would like the results of her cardiac echo that was done earlier this month.   Please advise Dr Jenetta Downer

## 2021-07-14 NOTE — Telephone Encounter (Signed)
I called the patient and gave her the results and she voices understanding.Nothing further needed.  

## 2021-07-14 NOTE — Telephone Encounter (Signed)
Echocardiogram was relatively normal with normal ejection fraction, normal right-sided pressures

## 2021-07-24 DIAGNOSIS — H04123 Dry eye syndrome of bilateral lacrimal glands: Secondary | ICD-10-CM | POA: Diagnosis not present

## 2021-07-24 DIAGNOSIS — E119 Type 2 diabetes mellitus without complications: Secondary | ICD-10-CM | POA: Diagnosis not present

## 2021-07-25 DIAGNOSIS — J438 Other emphysema: Secondary | ICD-10-CM | POA: Diagnosis not present

## 2021-08-11 ENCOUNTER — Telehealth: Payer: Self-pay

## 2021-08-11 NOTE — Telephone Encounter (Signed)
The patient has some billing questions. She needs to know if Dr. Ladona Ridgel is an approve physician on her insurance plan. Please give her a call back.

## 2021-08-15 DIAGNOSIS — E1121 Type 2 diabetes mellitus with diabetic nephropathy: Secondary | ICD-10-CM | POA: Diagnosis not present

## 2021-08-15 DIAGNOSIS — K219 Gastro-esophageal reflux disease without esophagitis: Secondary | ICD-10-CM | POA: Diagnosis not present

## 2021-08-15 DIAGNOSIS — I10 Essential (primary) hypertension: Secondary | ICD-10-CM | POA: Diagnosis not present

## 2021-08-15 DIAGNOSIS — E785 Hyperlipidemia, unspecified: Secondary | ICD-10-CM | POA: Diagnosis not present

## 2021-08-15 DIAGNOSIS — I5022 Chronic systolic (congestive) heart failure: Secondary | ICD-10-CM | POA: Diagnosis not present

## 2021-08-15 DIAGNOSIS — J449 Chronic obstructive pulmonary disease, unspecified: Secondary | ICD-10-CM | POA: Diagnosis not present

## 2021-08-24 DIAGNOSIS — J438 Other emphysema: Secondary | ICD-10-CM | POA: Diagnosis not present

## 2021-08-29 ENCOUNTER — Encounter: Payer: Self-pay | Admitting: Internal Medicine

## 2021-08-29 ENCOUNTER — Ambulatory Visit (INDEPENDENT_AMBULATORY_CARE_PROVIDER_SITE_OTHER): Payer: Medicare HMO | Admitting: Internal Medicine

## 2021-08-29 VITALS — BP 110/72 | Ht 65.0 in | Wt 201.8 lb

## 2021-08-29 DIAGNOSIS — I5022 Chronic systolic (congestive) heart failure: Secondary | ICD-10-CM

## 2021-08-29 DIAGNOSIS — I480 Paroxysmal atrial fibrillation: Secondary | ICD-10-CM

## 2021-08-29 DIAGNOSIS — Z950 Presence of cardiac pacemaker: Secondary | ICD-10-CM | POA: Diagnosis not present

## 2021-08-29 LAB — CUP PACEART INCLINIC DEVICE CHECK
Battery Remaining Longevity: 45 mo
Battery Voltage: 2.96 V
Brady Statistic RA Percent Paced: 2.6 %
Brady Statistic RV Percent Paced: 88 %
Date Time Interrogation Session: 20230711165638
Implantable Lead Implant Date: 20110127
Implantable Lead Implant Date: 20110127
Implantable Lead Implant Date: 20110127
Implantable Lead Location: 753858
Implantable Lead Location: 753859
Implantable Lead Location: 753860
Implantable Lead Model: 7122
Implantable Pulse Generator Implant Date: 20190418
Lead Channel Impedance Value: 387.5 Ohm
Lead Channel Impedance Value: 575 Ohm
Lead Channel Impedance Value: 962.5 Ohm
Lead Channel Pacing Threshold Amplitude: 0.75 V
Lead Channel Pacing Threshold Amplitude: 0.75 V
Lead Channel Pacing Threshold Amplitude: 1 V
Lead Channel Pacing Threshold Amplitude: 1 V
Lead Channel Pacing Threshold Pulse Width: 0.5 ms
Lead Channel Pacing Threshold Pulse Width: 0.5 ms
Lead Channel Pacing Threshold Pulse Width: 0.6 ms
Lead Channel Pacing Threshold Pulse Width: 0.6 ms
Lead Channel Sensing Intrinsic Amplitude: 0.6 mV
Lead Channel Sensing Intrinsic Amplitude: 12 mV
Lead Channel Setting Pacing Amplitude: 2 V
Lead Channel Setting Pacing Amplitude: 2.5 V
Lead Channel Setting Pacing Pulse Width: 0.5 ms
Lead Channel Setting Pacing Pulse Width: 0.6 ms
Lead Channel Setting Sensing Sensitivity: 2 mV
Pulse Gen Model: 3222
Pulse Gen Serial Number: 9393517

## 2021-08-29 MED ORDER — FUROSEMIDE 40 MG PO TABS: ORAL_TABLET | ORAL | 3 refills | Status: DC

## 2021-08-29 NOTE — Progress Notes (Signed)
HPI [name] returns for ongoing ICD followup. She is a pleasant 81 yo woman with a h/o CAD, s/p MI, chronic systolic heart failure, s/p BiV ICD with downgrade to a biv PPM. She has been stable.  She has occaisional palpitations. She notes dyspnea with exertion. Mild peripheral edema which she treats with diuretic therapy.  Her blood pressure has been under control.  She has a h/o perm atrial fib. She denies chest pain. She has required adjustment of her diuretic dose due to her propensity for volume overload. her EF had normalized by echo 3.5 years ago. She has never had VT. She was switched to a biv PPM at the time of her last gen change. Allergies  Allergen Reactions   Metformin And Related Other (See Comments)    "caused me kidney problems"    Adhesive [Tape] Other (See Comments)    Burning sensation   Atorvastatin Other (See Comments)    Makes legs hurt    Sulfonamide Derivatives Other (See Comments)    itch     Current Outpatient Medications  Medication Sig Dispense Refill   acetaminophen (TYLENOL) 325 MG tablet Take 2 tablets (650 mg total) by mouth every 6 (six) hours as needed for mild pain or moderate pain.     albuterol (PROAIR HFA) 108 (90 Base) MCG/ACT inhaler Inhale 1-2 puffs into the lungs every 6 (six) hours as needed for wheezing or shortness of breath. 18 g 5   ALPRAZolam (XANAX) 0.5 MG tablet Take 0.5 mg by mouth 2 (two) times daily as needed for anxiety or sleep.      amLODipine (NORVASC) 5 MG tablet Take 5 mg by mouth daily.     Azelastine HCl 137 MCG/SPRAY SOLN Place 2 sprays into both nostrils at bedtime.     calcium carbonate (TUMS EX) 750 MG chewable tablet Chew 2 tablets by mouth as needed for heartburn.     carvedilol (COREG) 6.25 MG tablet Take 6.25 mg by mouth 2 (two) times daily with a meal.     cholecalciferol (VITAMIN D3) 25 MCG (1000 UNIT) tablet Take 1,000 Units by mouth daily.     diphenhydrAMINE (BENADRYL) 25 MG tablet Take 12.5 mg by mouth  daily as needed for itching.     ezetimibe (ZETIA) 10 MG tablet Take 10 mg by mouth daily.     Fluticasone-Umeclidin-Vilant (TRELEGY ELLIPTA) 200-62.5-25 MCG/ACT AEPB Inhale 1 puff into the lungs daily. 60 each 6   furosemide (LASIX) 40 MG tablet Take 40 mg by mouth daily.     HYDROcodone-acetaminophen (NORCO/VICODIN) 5-325 MG tablet Take 0.5-1 tablets by mouth at bedtime.     Insulin Glargine (BASAGLAR KWIKPEN) 100 UNIT/ML Inject 14 Units into the skin daily.     ipratropium-albuterol (DUONEB) 0.5-2.5 (3) MG/3ML SOLN Take 3 mLs by nebulization every 6 (six) hours as needed. 360 mL 2   linagliptin (TRADJENTA) 5 MG TABS tablet Take 5 mg by mouth daily.     magnesium gluconate (MAGONATE) 500 MG tablet Take 500 mg by mouth daily as needed (for leg cramps).      omeprazole (PRILOSEC) 40 MG capsule Take 40 mg by mouth daily.       OXYGEN Inhale 1.5 L into the lungs at bedtime.      PARoxetine (PAXIL) 30 MG tablet Take 30 mg by mouth daily.       tiZANidine (ZANAFLEX) 2 MG tablet Take 2 mg by mouth at bedtime as needed for muscle spasms.  warfarin (COUMADIN) 5 MG tablet Take 2.5-5 mg by mouth See admin instructions. Take 5 mg by mouth daily on Sunday and Thursday. Take 2.5 mg by mouth daily on all other days     No current facility-administered medications for this visit.     Past Medical History:  Diagnosis Date   Anemia    Anxiety    CAD (coronary artery disease)    Chronic kidney disease    CKD stage 3    COPD (chronic obstructive pulmonary disease) (HCC)    uses  1 to and1.5 L of o2 at bedtime ; has not seen her pulm doctor in over a year ; has not had to uses inhlaer in a over a year     Diabetes mellitus    type II   GERD (gastroesophageal reflux disease)    HTN (hypertension)    essential   Hyperlipidemia, mixed    Mitral valve disorder    Osteoarthritis    Panic attack    Paroxysmal atrial fibrillation ([place])    Pneumonia    2010   Pulmonary embolism ([place]) 2010   Sleep  apnea    does not uses cpap since she got a URI from it. has home 02 as needed    ROS:   All systems reviewed and negative except as noted in the HPI.   Past Surgical History:  Procedure Laterality Date   BIOPSY BREAST     BIV ICD GENERATOR CHANGEOUT N/A 06/06/2017   Procedure: BIV ICD GENERATOR CHANGEOUT;  Surgeon: Evans Lance, MD;  Location: [facility];  Service: Cardiovascular;  Laterality: N/A;   CARDIAC VALVE SURGERY     mitral   carpal tennel  2003   EYE SURGERY  2015  or 2016   cataract surgery    PARATHYROIDECTOMY Left 06/17/2017   Procedure: LEFT UPPER PARATHYROIDECTOMY;  Surgeon: Clovis Riley, MD;  Location: [facility];  Service: General;  Laterality: Left;   TUBAL LIGATION      over 63 years ago      Family History  Problem Relation Age of Onset   Stroke Mother    Anuerysm Father    Diabetes Sister    Cancer Brother    Ulcers Sister      Social History   Socioeconomic History   Marital status: Widowed    Spouse name: Not on file   Number of children: Not on file   Years of education: Not on file   Highest education level: Not on file  Occupational History   Occupation: reitred  Tobacco Use   Smoking status: Former    Packs/day: 1.00    Years: 50.00    Total pack years: 50.00    Types: Cigarettes    Quit date: 02/19/2001    Years since quitting: 20.5   Smokeless tobacco: Never  Vaping Use   Vaping Use: Never used  Substance and Sexual Activity   Alcohol use: No    Alcohol/week: 0.0 standard drinks of alcohol   Drug use: Never   Sexual activity: Not on file  Other Topics Concern   Not on file  Social History Narrative   Not on file   Social Determinants of Health   Financial Resource Strain: Not on file  Food Insecurity: Not on file  Transportation Needs: Not on file  Physical Activity: Not on file  Stress: Not on file  Social Connections: Not on file  Intimate Partner Violence: Not on file  BP 110/72   Ht '5\' 5"'$   (1.651 m)   Wt 201 lb 12.8 oz (91.5 kg)   SpO2 96%   BMI 33.58 kg/m   Physical Exam:  Well appearing NAD HEENT: Unremarkable but her voice is hoarse. Neck:  No JVD, no thyromegally Lymphatics:  No adenopathy Back:  No CVA tenderness Lungs:  Clear with no wheezes HEART:  IRegular rate rhythm, no murmurs, no rubs, no clicks Abd:  soft, positive bowel sounds, no organomegally, no rebound, no guarding Ext:  2 plus pulses, no edema, no cyanosis, no clubbing Skin:  No rashes no nodules Neuro:  CN II through XII intact, motor grossly intact  EKG - atrial fib with ventricular pacing and sensing  DEVICE  Normal device function.  See PaceArt for details.   Assess/Plan:  1. Atrial fib - she has adequate rate control. She would like to switch to Eliquis. We will stop warfarin and start an [place]. 2. Atrial flutter - this is non-isthmus dependent. I have reviewed the treatment options. She will undergo rate control. 3. PPM - her [employer] Biv PPM is working normally. We will recheck in several months 4. Chronic systolic heart failure - her symptoms are class 2. She will continue her current meds. I asked her to increase her lasix.    Carleene Overlie Yanelis Osika,MD

## 2021-08-29 NOTE — Patient Instructions (Addendum)
Medication Instructions:  Your physician has recommended you make the following change in your medication:    STOP amlodipine  2.   INCREASE your furosemide (lasix) 40 mg-  Take one tablet in the morning and one tablet after lunch (early afternoon).   Labwork: None ordered.  Testing/Procedures: None ordered.  Follow-Up: Your physician wants you to follow-up in: one year with Emily Peru, MD or one of the following Advanced Practice Providers on your designated Care Team:   Tommye Standard, [place] Legrand Como "Jonni Sanger" Chalmers Cater, [place]  Remote monitoring is used to monitor your Pacemaker from home. This monitoring reduces the number of office visits required to check your device to one time per year. It allows [place] to keep an eye on the functioning of your device to ensure it is working properly. You are scheduled for a device check from home on 09/29/2021. You may send your transmission at any time that day. If you have a wireless device, the transmission will be sent automatically. After your physician reviews your transmission, you will receive a postcard with your next transmission date.  Any Other Special Instructions Will Be Listed Below (If Applicable).  If you need a refill on your cardiac medications before your next appointment, please call your pharmacy.   Important Information About Sugar

## 2021-09-12 ENCOUNTER — Telehealth: Payer: Self-pay

## 2021-09-12 NOTE — Telephone Encounter (Signed)
The patient states when she was here to see Dr. Lovena Le he wrote her a prescription for warfrin but she missed placed it. She would like another copy of it and also she has questions about a coupon for Warfrin. She would like for the nurse to give her a call back. Her phone number is [phone].

## 2021-09-13 DIAGNOSIS — I5022 Chronic systolic (congestive) heart failure: Secondary | ICD-10-CM | POA: Diagnosis not present

## 2021-09-13 DIAGNOSIS — I10 Essential (primary) hypertension: Secondary | ICD-10-CM | POA: Diagnosis not present

## 2021-09-13 DIAGNOSIS — E785 Hyperlipidemia, unspecified: Secondary | ICD-10-CM | POA: Diagnosis not present

## 2021-09-13 DIAGNOSIS — J449 Chronic obstructive pulmonary disease, unspecified: Secondary | ICD-10-CM | POA: Diagnosis not present

## 2021-09-13 DIAGNOSIS — K219 Gastro-esophageal reflux disease without esophagitis: Secondary | ICD-10-CM | POA: Diagnosis not present

## 2021-09-13 DIAGNOSIS — E1121 Type 2 diabetes mellitus with diabetic nephropathy: Secondary | ICD-10-CM | POA: Diagnosis not present

## 2021-09-14 NOTE — Telephone Encounter (Signed)
Unsuccessful telephone encounter to patient to discuss needs. Discussed with Bethann Humble, RN who states patient was provided Eliquis prescription to check with pharmacy about cost. Patient currently prescribed and taking coumadin. [employer] compliant VM message left requesting patient call back Bethann Humble, RN or P. Rollene Rotunda, RN today before 5 pm or Monday as neither RN is in the office tomorrow.

## 2021-09-24 DIAGNOSIS — J438 Other emphysema: Secondary | ICD-10-CM | POA: Diagnosis not present

## 2021-09-29 ENCOUNTER — Ambulatory Visit (INDEPENDENT_AMBULATORY_CARE_PROVIDER_SITE_OTHER): Payer: Medicare HMO

## 2021-09-29 DIAGNOSIS — I5022 Chronic systolic (congestive) heart failure: Secondary | ICD-10-CM | POA: Diagnosis not present

## 2021-09-29 LAB — CUP PACEART REMOTE DEVICE CHECK
Battery Remaining Longevity: 49 mo
Battery Remaining Percentage: 47 %
Battery Voltage: 2.98 V
Date Time Interrogation Session: 20230811040013
Implantable Lead Implant Date: 20110127
Implantable Lead Implant Date: 20110127
Implantable Lead Implant Date: 20110127
Implantable Lead Location: 753858
Implantable Lead Location: 753859
Implantable Lead Location: 753860
Implantable Lead Model: 7122
Implantable Pulse Generator Implant Date: 20190418
Lead Channel Impedance Value: 560 Ohm
Lead Channel Impedance Value: 840 Ohm
Lead Channel Pacing Threshold Amplitude: 0.75 V
Lead Channel Pacing Threshold Amplitude: 0.75 V
Lead Channel Pacing Threshold Pulse Width: 0.5 ms
Lead Channel Pacing Threshold Pulse Width: 0.6 ms
Lead Channel Sensing Intrinsic Amplitude: 12 mV
Lead Channel Setting Pacing Amplitude: 2 V
Lead Channel Setting Pacing Amplitude: 2.5 V
Lead Channel Setting Pacing Pulse Width: 0.5 ms
Lead Channel Setting Pacing Pulse Width: 0.6 ms
Lead Channel Setting Sensing Sensitivity: 2 mV
Pulse Gen Model: 3222
Pulse Gen Serial Number: 9393517

## 2021-10-03 ENCOUNTER — Telehealth: Payer: Self-pay

## 2021-10-03 NOTE — Telephone Encounter (Signed)
Call back received from Pt.  Pt requesting another eliquis prescription be sent so she can price shop.  Sent another prescription for Eliquis to Pt.

## 2021-10-03 NOTE — Telephone Encounter (Signed)
The patient called to ask can Dr. Lovena Le nurse can mail her a copy of her prescription. I told her the nurse will send the prescription in the mail.

## 2021-10-10 ENCOUNTER — Ambulatory Visit: Payer: Medicare HMO | Admitting: Pulmonary Disease

## 2021-10-10 ENCOUNTER — Encounter: Payer: Self-pay | Admitting: Pulmonary Disease

## 2021-10-10 ENCOUNTER — Ambulatory Visit (INDEPENDENT_AMBULATORY_CARE_PROVIDER_SITE_OTHER): Payer: Medicare HMO | Admitting: Pulmonary Disease

## 2021-10-10 VITALS — BP 124/76 | HR 72 | Temp 98.0°F | Ht 66.0 in | Wt 196.8 lb

## 2021-10-10 DIAGNOSIS — J449 Chronic obstructive pulmonary disease, unspecified: Secondary | ICD-10-CM

## 2021-10-10 DIAGNOSIS — R0609 Other forms of dyspnea: Secondary | ICD-10-CM | POA: Diagnosis not present

## 2021-10-10 LAB — PULMONARY FUNCTION TEST
DL/VA % pred: 63 %
DL/VA: 2.53 ml/min/mmHg/L
DLCO cor % pred: 62 %
DLCO cor: 12.41 ml/min/mmHg
DLCO unc % pred: 62 %
DLCO unc: 12.41 ml/min/mmHg
FEF 25-75 Post: 1.67 L/sec
FEF 25-75 Pre: 1.07 L/sec
FEF2575-%Change-Post: 56 %
FEF2575-%Pred-Post: 117 %
FEF2575-%Pred-Pre: 74 %
FEV1-%Change-Post: 10 %
FEV1-%Pred-Post: 105 %
FEV1-%Pred-Pre: 94 %
FEV1-Post: 2.17 L
FEV1-Pre: 1.96 L
FEV1FVC-%Change-Post: 2 %
FEV1FVC-%Pred-Pre: 93 %
FEV6-%Change-Post: 8 %
FEV6-%Pred-Post: 116 %
FEV6-%Pred-Pre: 107 %
FEV6-Post: 3.04 L
FEV6-Pre: 2.81 L
FEV6FVC-%Change-Post: 0 %
FEV6FVC-%Pred-Post: 104 %
FEV6FVC-%Pred-Pre: 104 %
FVC-%Change-Post: 7 %
FVC-%Pred-Post: 110 %
FVC-%Pred-Pre: 103 %
FVC-Post: 3.08 L
FVC-Pre: 2.86 L
Post FEV1/FVC ratio: 70 %
Post FEV6/FVC ratio: 99 %
Pre FEV1/FVC ratio: 68 %
Pre FEV6/FVC Ratio: 98 %
RV % pred: 99 %
RV: 2.53 L
TLC % pred: 108 %
TLC: 5.8 L

## 2021-10-10 MED ORDER — TRELEGY ELLIPTA 100-62.5-25 MCG/ACT IN AEPB: 1.0000 | INHALATION_SPRAY | Freq: Every day | RESPIRATORY_TRACT | 3 refills | Status: DC

## 2021-10-10 MED ORDER — ALBUTEROL SULFATE HFA 108 (90 BASE) MCG/ACT IN AERS: 1.0000 | INHALATION_SPRAY | Freq: Four times a day (QID) | RESPIRATORY_TRACT | 5 refills | Status: DC | PRN

## 2021-10-10 NOTE — Progress Notes (Signed)
Full PFT Performed Today  

## 2021-10-10 NOTE — Progress Notes (Signed)
Emily Abbott    [id number]    1941/01/28  Primary Care Physician:White, Caren Griffins, MD  Referring Physician: Harlan Stains, MD [address] [place],  [place] [address]  Chief complaint:   History of COPD Breathing has been relatively stable since last visit  HPI:  Shortness of breath on exertion History of COPD History of lung nodule  Symptoms more related to having significant knee pain Gets winded with moderate activity  Daughter was present for the visit today and reports sometimes gets cyanotic  She does have a history of obstructive sleep apnea, not able to tolerate CPAP She does use oxygen sometimes at night  Has had to use 3 courses of antibiotics this year continues Trelegy 100 on a daily basis  Has nasal stuffiness and congestion, cough with no sputum production Not feeling acutely ill Uses Flonase for nasal stuffiness  History of atrial fibrillation, currently in atrial flutter History of mitral valve repair  History of cardiomyopathy with recent echo showing normal ejection fraction, suboptimal to comment on pulmonary hypertension  Pulmonary function study from 2017 revealed very mild obstructive disease   Outpatient Encounter Medications as of 10/10/2021  Medication Sig   acetaminophen (TYLENOL) 325 MG tablet Take 2 tablets (650 mg total) by mouth every 6 (six) hours as needed for mild pain or moderate pain.   ALPRAZolam (XANAX) 0.5 MG tablet Take 0.5 mg by mouth 2 (two) times daily as needed for anxiety or sleep.    Azelastine HCl 137 MCG/SPRAY SOLN Place 2 sprays into both nostrils at bedtime.   calcium carbonate (TUMS EX) 750 MG chewable tablet Chew 2 tablets by mouth as needed for heartburn.   carvedilol (COREG) 6.25 MG tablet Take 6.25 mg by mouth 2 (two) times daily with a meal.   cholecalciferol (VITAMIN D3) 25 MCG (1000 UNIT) tablet Take 1,000 Units by mouth daily.   diphenhydrAMINE (BENADRYL) 25 MG tablet Take 12.5 mg by  mouth daily as needed for itching.   ezetimibe (ZETIA) 10 MG tablet Take 10 mg by mouth daily.   Fluticasone-Umeclidin-Vilant (TRELEGY ELLIPTA) 100-62.5-25 MCG/ACT AEPB Inhale 1 puff into the lungs daily.   Fluticasone-Umeclidin-Vilant (TRELEGY ELLIPTA) 200-62.5-25 MCG/ACT AEPB Inhale 1 puff into the lungs daily.   furosemide (LASIX) 40 MG tablet Take one tablet in the AM and one tablet after lunch (early afternoon).   HYDROcodone-acetaminophen (NORCO/VICODIN) 5-325 MG tablet Take 0.5-1 tablets by mouth at bedtime.   Insulin Glargine (BASAGLAR KWIKPEN) 100 UNIT/ML Inject 14 Units into the skin daily.   ipratropium-albuterol (DUONEB) 0.5-2.5 (3) MG/3ML SOLN Take 3 mLs by nebulization every 6 (six) hours as needed.   linagliptin (TRADJENTA) 5 MG TABS tablet Take 5 mg by mouth daily.   magnesium gluconate (MAGONATE) 500 MG tablet Take 500 mg by mouth daily as needed (for leg cramps).    omeprazole (PRILOSEC) 40 MG capsule Take 40 mg by mouth daily.     OXYGEN Inhale 1.5 L into the lungs at bedtime.    PARoxetine (PAXIL) 30 MG tablet Take 30 mg by mouth daily.     tiZANidine (ZANAFLEX) 2 MG tablet Take 2 mg by mouth at bedtime as needed for muscle spasms.    warfarin (COUMADIN) 5 MG tablet Take 2.5-5 mg by mouth See admin instructions. Take 5 mg by mouth daily on Sunday and Thursday. Take 2.5 mg by mouth daily on all other days   [DISCONTINUED] albuterol (PROAIR HFA) 108 (90 Base) MCG/ACT  inhaler Inhale 1-2 puffs into the lungs every 6 (six) hours as needed for wheezing or shortness of breath.   albuterol (PROAIR HFA) 108 (90 Base) MCG/ACT inhaler Inhale 1-2 puffs into the lungs every 6 (six) hours as needed for wheezing or shortness of breath.   No facility-administered encounter medications on file as of 10/10/2021.    Allergies as of 10/10/2021 - Review Complete 10/10/2021  Allergen Reaction Noted   Metformin and related Other (See Comments) 06/14/2017   Adhesive [tape] Other (See Comments)  06/04/2017   Atorvastatin Other (See Comments) 06/17/2017   Sulfonamide derivatives Other (See Comments)     Past Medical History:  Diagnosis Date   Anemia    Anxiety    CAD (coronary artery disease)    Chronic kidney disease    CKD stage 3    COPD (chronic obstructive pulmonary disease) (HCC)    uses  1 to and1.5 L of o2 at bedtime ; has not seen her pulm doctor in over a year ; has not had to uses inhlaer in a over a year     Diabetes mellitus    type II   GERD (gastroesophageal reflux disease)    HTN (hypertension)    essential   Hyperlipidemia, mixed    Mitral valve disorder    Osteoarthritis    Panic attack    Paroxysmal atrial fibrillation ([place])    Pneumonia    2010   Pulmonary embolism ([facility]) 2010   Sleep apnea    does not uses cpap since she got a URI from it. has home 02 as needed    Past Surgical History:  Procedure Laterality Date   BIOPSY BREAST     BIV ICD GENERATOR CHANGEOUT N/A 06/06/2017   Procedure: BIV ICD GENERATOR CHANGEOUT;  Surgeon: Evans Lance, MD;  Location: [facility];  Service: Cardiovascular;  Laterality: N/A;   CARDIAC VALVE SURGERY     mitral   carpal tennel  2003   EYE SURGERY  2015  or 2016   cataract surgery    PARATHYROIDECTOMY Left 06/17/2017   Procedure: LEFT UPPER PARATHYROIDECTOMY;  Surgeon: Clovis Riley, MD;  Location: [facility];  Service: General;  Laterality: Left;   TUBAL LIGATION      over 30 years ago     Family History  Problem Relation Age of Onset   Stroke Mother    Anuerysm Father    Diabetes Sister    Cancer Brother    Ulcers Sister     Social History   Socioeconomic History   Marital status: Widowed    Spouse name: Not on file   Number of children: Not on file   Years of education: Not on file   Highest education level: Not on file  Occupational History   Occupation: reitred  Tobacco Use   Smoking status: Former    Packs/day: 1.00    Years: 50.00    Total pack years: 50.00    Types:  Cigarettes    Quit date: 02/19/2001    Years since quitting: 20.6   Smokeless tobacco: Never  Vaping Use   Vaping Use: Never used  Substance and Sexual Activity   Alcohol use: No    Alcohol/week: 0.0 standard drinks of alcohol   Drug use: Never   Sexual activity: Not on file  Other Topics Concern   Not on file  Social History Narrative   Not on file   Social Determinants of Health   Financial  Resource Strain: Not on file  Food Insecurity: Not on file  Transportation Needs: Not on file  Physical Activity: Not on file  Stress: Not on file  Social Connections: Not on file  Intimate Partner Violence: Not on file    Review of Systems  Respiratory:  Positive for shortness of breath. Negative for cough.   Cardiovascular:  Negative for chest pain and palpitations.  Skin: Negative.   Psychiatric/Behavioral: Negative.  Negative for sleep disturbance.   All other systems reviewed and are negative.   Vitals:   10/10/21 1407  BP: 124/76  Pulse: 72  Temp: 98 F (36.7 C)  SpO2: 98%     Physical Exam Constitutional:      Appearance: She is obese.  HENT:     Head: Normocephalic and atraumatic.     Mouth/Throat:     Mouth: Mucous membranes are moist.  Eyes:     General:        Right eye: No discharge.        Left eye: No discharge.  Cardiovascular:     Pulses: Normal pulses.     Heart sounds: Normal heart sounds. No murmur heard.    No friction rub.  Musculoskeletal:        General: Normal range of motion.     Cervical back: No rigidity or tenderness.  Skin:    General: Skin is warm.  Neurological:     General: No focal deficit present.     Mental Status: She is alert.  Psychiatric:        Mood and Affect: Mood normal.    Data Reviewed: CT scan of the chest reviewed with the patient showing dominant right upper lobe nodule, peripheral right middle lobe nodule, left lower lobe nodule all unchanged from previous Previous CT also reviewed  Echocardiogram from 2021  shows normal ejection fraction 55 to 60%, normal right ventricular size, right ventricular systolic function is normal Suboptimal for any mention of pulmonary hypertension  PFT seen-mild obstructive disease  PFT today was compared to previous PFT from 2017 and showed only mild reduction in FEV1 and FVC, no significant progression from 2017  Assessment:  Chronic obstructive pulmonary disease -No significant exacerbation impression -Will continue Trelegy 100  Most recent echocardiogram shows normal ejection fraction  History of atrial flutter  Osteoarthritis  History of multiple lung nodules -Stable from previous studies -No need for repeat CT  Plan/Recommendations: We will continue Trelegy 100  Graded exercises as tolerated  Encouraged to call with any significant concerns  As she is stable at present, We will follow-up in a year   Sherrilyn Rist MD [facility] 10/10/2021, 2:29 PM  CC: Harlan Stains, MD

## 2021-10-10 NOTE — Patient Instructions (Signed)
Full PFT Performed Today  

## 2021-10-10 NOTE — Patient Instructions (Signed)
Your breathing study is relatively stable compared to the one in 2016  Continue Trelegy, stay with Trelegy 100 Rescue inhaler use as needed  Graded exercise as tolerated  I will see you back in a year from now Call with significant concerns

## 2021-10-24 NOTE — Progress Notes (Signed)
Remote pacemaker transmission.   

## 2021-10-25 DIAGNOSIS — J438 Other emphysema: Secondary | ICD-10-CM | POA: Diagnosis not present

## 2021-10-30 DIAGNOSIS — N183 Chronic kidney disease, stage 3 unspecified: Secondary | ICD-10-CM | POA: Diagnosis not present

## 2021-10-30 DIAGNOSIS — E785 Hyperlipidemia, unspecified: Secondary | ICD-10-CM | POA: Diagnosis not present

## 2021-10-30 DIAGNOSIS — I480 Paroxysmal atrial fibrillation: Secondary | ICD-10-CM | POA: Diagnosis not present

## 2021-10-30 DIAGNOSIS — J9611 Chronic respiratory failure with hypoxia: Secondary | ICD-10-CM | POA: Diagnosis not present

## 2021-10-30 DIAGNOSIS — G720 Drug-induced myopathy: Secondary | ICD-10-CM | POA: Diagnosis not present

## 2021-10-30 DIAGNOSIS — I429 Cardiomyopathy, unspecified: Secondary | ICD-10-CM | POA: Diagnosis not present

## 2021-10-30 DIAGNOSIS — R69 Illness, unspecified: Secondary | ICD-10-CM | POA: Diagnosis not present

## 2021-10-30 DIAGNOSIS — I130 Hypertensive heart and chronic kidney disease with heart failure and stage 1 through stage 4 chronic kidney disease, or unspecified chronic kidney disease: Secondary | ICD-10-CM | POA: Diagnosis not present

## 2021-10-30 DIAGNOSIS — Z23 Encounter for immunization: Secondary | ICD-10-CM | POA: Diagnosis not present

## 2021-10-30 DIAGNOSIS — Z7901 Long term (current) use of anticoagulants: Secondary | ICD-10-CM | POA: Diagnosis not present

## 2021-10-30 DIAGNOSIS — E1121 Type 2 diabetes mellitus with diabetic nephropathy: Secondary | ICD-10-CM | POA: Diagnosis not present

## 2021-10-30 DIAGNOSIS — K219 Gastro-esophageal reflux disease without esophagitis: Secondary | ICD-10-CM | POA: Diagnosis not present

## 2021-11-24 DIAGNOSIS — J438 Other emphysema: Secondary | ICD-10-CM | POA: Diagnosis not present

## 2021-11-27 DIAGNOSIS — Z7901 Long term (current) use of anticoagulants: Secondary | ICD-10-CM | POA: Diagnosis not present

## 2021-11-27 DIAGNOSIS — D72829 Elevated white blood cell count, unspecified: Secondary | ICD-10-CM | POA: Diagnosis not present

## 2021-11-27 DIAGNOSIS — I480 Paroxysmal atrial fibrillation: Secondary | ICD-10-CM | POA: Diagnosis not present

## 2021-11-30 DIAGNOSIS — E1121 Type 2 diabetes mellitus with diabetic nephropathy: Secondary | ICD-10-CM | POA: Diagnosis not present

## 2021-11-30 DIAGNOSIS — I10 Essential (primary) hypertension: Secondary | ICD-10-CM | POA: Diagnosis not present

## 2021-11-30 DIAGNOSIS — E785 Hyperlipidemia, unspecified: Secondary | ICD-10-CM | POA: Diagnosis not present

## 2021-11-30 DIAGNOSIS — K219 Gastro-esophageal reflux disease without esophagitis: Secondary | ICD-10-CM | POA: Diagnosis not present

## 2021-12-14 DIAGNOSIS — Z7901 Long term (current) use of anticoagulants: Secondary | ICD-10-CM | POA: Diagnosis not present

## 2021-12-25 DIAGNOSIS — J438 Other emphysema: Secondary | ICD-10-CM | POA: Diagnosis not present

## 2021-12-29 ENCOUNTER — Ambulatory Visit (INDEPENDENT_AMBULATORY_CARE_PROVIDER_SITE_OTHER): Payer: Medicare HMO

## 2021-12-29 DIAGNOSIS — I428 Other cardiomyopathies: Secondary | ICD-10-CM

## 2021-12-30 DIAGNOSIS — R350 Frequency of micturition: Secondary | ICD-10-CM | POA: Diagnosis not present

## 2021-12-30 DIAGNOSIS — J069 Acute upper respiratory infection, unspecified: Secondary | ICD-10-CM | POA: Diagnosis not present

## 2021-12-30 LAB — CUP PACEART REMOTE DEVICE CHECK
Battery Remaining Longevity: 47 mo
Battery Remaining Percentage: 45 %
Battery Voltage: 2.98 V
Date Time Interrogation Session: 20231110043803
Implantable Lead Connection Status: 753985
Implantable Lead Connection Status: 753985
Implantable Lead Connection Status: 753985
Implantable Lead Implant Date: 20110127
Implantable Lead Implant Date: 20110127
Implantable Lead Implant Date: 20110127
Implantable Lead Location: 753858
Implantable Lead Location: 753859
Implantable Lead Location: 753860
Implantable Lead Model: 7122
Implantable Pulse Generator Implant Date: 20190418
Lead Channel Impedance Value: 600 Ohm
Lead Channel Impedance Value: 840 Ohm
Lead Channel Pacing Threshold Amplitude: 0.75 V
Lead Channel Pacing Threshold Amplitude: 1 V
Lead Channel Pacing Threshold Pulse Width: 0.5 ms
Lead Channel Pacing Threshold Pulse Width: 0.6 ms
Lead Channel Sensing Intrinsic Amplitude: 12 mV
Lead Channel Setting Pacing Amplitude: 2 V
Lead Channel Setting Pacing Amplitude: 2.5 V
Lead Channel Setting Pacing Pulse Width: 0.5 ms
Lead Channel Setting Pacing Pulse Width: 0.6 ms
Lead Channel Setting Sensing Sensitivity: 2 mV
Pulse Gen Model: 3222
Pulse Gen Serial Number: 9393517

## 2022-01-03 NOTE — Progress Notes (Signed)
Remote pacemaker transmission.   

## 2022-01-05 DIAGNOSIS — J449 Chronic obstructive pulmonary disease, unspecified: Secondary | ICD-10-CM | POA: Diagnosis not present

## 2022-01-05 DIAGNOSIS — E785 Hyperlipidemia, unspecified: Secondary | ICD-10-CM | POA: Diagnosis not present

## 2022-01-05 DIAGNOSIS — I10 Essential (primary) hypertension: Secondary | ICD-10-CM | POA: Diagnosis not present

## 2022-01-05 DIAGNOSIS — E1121 Type 2 diabetes mellitus with diabetic nephropathy: Secondary | ICD-10-CM | POA: Diagnosis not present

## 2022-01-05 DIAGNOSIS — R69 Illness, unspecified: Secondary | ICD-10-CM | POA: Diagnosis not present

## 2022-01-05 DIAGNOSIS — N183 Chronic kidney disease, stage 3 unspecified: Secondary | ICD-10-CM | POA: Diagnosis not present

## 2022-01-05 DIAGNOSIS — K219 Gastro-esophageal reflux disease without esophagitis: Secondary | ICD-10-CM | POA: Diagnosis not present

## 2022-01-09 DIAGNOSIS — M170 Bilateral primary osteoarthritis of knee: Secondary | ICD-10-CM | POA: Diagnosis not present

## 2022-01-24 DIAGNOSIS — J438 Other emphysema: Secondary | ICD-10-CM | POA: Diagnosis not present

## 2022-01-30 DIAGNOSIS — I429 Cardiomyopathy, unspecified: Secondary | ICD-10-CM | POA: Diagnosis not present

## 2022-01-30 DIAGNOSIS — N183 Chronic kidney disease, stage 3 unspecified: Secondary | ICD-10-CM | POA: Diagnosis not present

## 2022-01-30 DIAGNOSIS — E210 Primary hyperparathyroidism: Secondary | ICD-10-CM | POA: Diagnosis not present

## 2022-01-30 DIAGNOSIS — I129 Hypertensive chronic kidney disease with stage 1 through stage 4 chronic kidney disease, or unspecified chronic kidney disease: Secondary | ICD-10-CM | POA: Diagnosis not present

## 2022-01-30 DIAGNOSIS — Z7901 Long term (current) use of anticoagulants: Secondary | ICD-10-CM | POA: Diagnosis not present

## 2022-01-30 DIAGNOSIS — E1122 Type 2 diabetes mellitus with diabetic chronic kidney disease: Secondary | ICD-10-CM | POA: Diagnosis not present

## 2022-01-30 DIAGNOSIS — R69 Illness, unspecified: Secondary | ICD-10-CM | POA: Diagnosis not present

## 2022-01-30 DIAGNOSIS — N1831 Chronic kidney disease, stage 3a: Secondary | ICD-10-CM | POA: Diagnosis not present

## 2022-02-15 DIAGNOSIS — M170 Bilateral primary osteoarthritis of knee: Secondary | ICD-10-CM | POA: Diagnosis not present

## 2022-02-22 DIAGNOSIS — M170 Bilateral primary osteoarthritis of knee: Secondary | ICD-10-CM | POA: Diagnosis not present

## 2022-02-24 DIAGNOSIS — J438 Other emphysema: Secondary | ICD-10-CM | POA: Diagnosis not present

## 2022-03-01 DIAGNOSIS — M170 Bilateral primary osteoarthritis of knee: Secondary | ICD-10-CM | POA: Diagnosis not present

## 2022-03-19 DIAGNOSIS — N2581 Secondary hyperparathyroidism of renal origin: Secondary | ICD-10-CM | POA: Diagnosis not present

## 2022-03-19 DIAGNOSIS — D6869 Other thrombophilia: Secondary | ICD-10-CM | POA: Diagnosis not present

## 2022-03-19 DIAGNOSIS — N183 Chronic kidney disease, stage 3 unspecified: Secondary | ICD-10-CM | POA: Diagnosis not present

## 2022-03-19 DIAGNOSIS — I10 Essential (primary) hypertension: Secondary | ICD-10-CM | POA: Diagnosis not present

## 2022-03-19 DIAGNOSIS — G720 Drug-induced myopathy: Secondary | ICD-10-CM | POA: Diagnosis not present

## 2022-03-19 DIAGNOSIS — M170 Bilateral primary osteoarthritis of knee: Secondary | ICD-10-CM | POA: Diagnosis not present

## 2022-03-19 DIAGNOSIS — I5022 Chronic systolic (congestive) heart failure: Secondary | ICD-10-CM | POA: Diagnosis not present

## 2022-03-19 DIAGNOSIS — J9611 Chronic respiratory failure with hypoxia: Secondary | ICD-10-CM | POA: Diagnosis not present

## 2022-03-19 DIAGNOSIS — I429 Cardiomyopathy, unspecified: Secondary | ICD-10-CM | POA: Diagnosis not present

## 2022-03-19 DIAGNOSIS — I480 Paroxysmal atrial fibrillation: Secondary | ICD-10-CM | POA: Diagnosis not present

## 2022-03-19 DIAGNOSIS — E785 Hyperlipidemia, unspecified: Secondary | ICD-10-CM | POA: Diagnosis not present

## 2022-03-19 DIAGNOSIS — E1121 Type 2 diabetes mellitus with diabetic nephropathy: Secondary | ICD-10-CM | POA: Diagnosis not present

## 2022-03-19 DIAGNOSIS — G894 Chronic pain syndrome: Secondary | ICD-10-CM | POA: Diagnosis not present

## 2022-03-20 DIAGNOSIS — E785 Hyperlipidemia, unspecified: Secondary | ICD-10-CM | POA: Diagnosis not present

## 2022-03-20 DIAGNOSIS — J449 Chronic obstructive pulmonary disease, unspecified: Secondary | ICD-10-CM | POA: Diagnosis not present

## 2022-03-20 DIAGNOSIS — E1121 Type 2 diabetes mellitus with diabetic nephropathy: Secondary | ICD-10-CM | POA: Diagnosis not present

## 2022-03-20 DIAGNOSIS — K219 Gastro-esophageal reflux disease without esophagitis: Secondary | ICD-10-CM | POA: Diagnosis not present

## 2022-03-20 DIAGNOSIS — I10 Essential (primary) hypertension: Secondary | ICD-10-CM | POA: Diagnosis not present

## 2022-03-20 DIAGNOSIS — N183 Chronic kidney disease, stage 3 unspecified: Secondary | ICD-10-CM | POA: Diagnosis not present

## 2022-03-27 DIAGNOSIS — J438 Other emphysema: Secondary | ICD-10-CM | POA: Diagnosis not present

## 2022-03-29 DIAGNOSIS — Z794 Long term (current) use of insulin: Secondary | ICD-10-CM | POA: Diagnosis not present

## 2022-03-29 DIAGNOSIS — G894 Chronic pain syndrome: Secondary | ICD-10-CM | POA: Diagnosis not present

## 2022-03-29 DIAGNOSIS — D6869 Other thrombophilia: Secondary | ICD-10-CM | POA: Diagnosis not present

## 2022-03-29 DIAGNOSIS — E1122 Type 2 diabetes mellitus with diabetic chronic kidney disease: Secondary | ICD-10-CM | POA: Diagnosis not present

## 2022-03-29 DIAGNOSIS — F419 Anxiety disorder, unspecified: Secondary | ICD-10-CM | POA: Diagnosis not present

## 2022-03-29 DIAGNOSIS — Z79891 Long term (current) use of opiate analgesic: Secondary | ICD-10-CM | POA: Diagnosis not present

## 2022-03-29 DIAGNOSIS — I5022 Chronic systolic (congestive) heart failure: Secondary | ICD-10-CM | POA: Diagnosis not present

## 2022-03-29 DIAGNOSIS — I480 Paroxysmal atrial fibrillation: Secondary | ICD-10-CM | POA: Diagnosis not present

## 2022-03-29 DIAGNOSIS — M170 Bilateral primary osteoarthritis of knee: Secondary | ICD-10-CM | POA: Diagnosis not present

## 2022-03-29 DIAGNOSIS — F32A Depression, unspecified: Secondary | ICD-10-CM | POA: Diagnosis not present

## 2022-03-29 DIAGNOSIS — E785 Hyperlipidemia, unspecified: Secondary | ICD-10-CM | POA: Diagnosis not present

## 2022-03-29 DIAGNOSIS — I130 Hypertensive heart and chronic kidney disease with heart failure and stage 1 through stage 4 chronic kidney disease, or unspecified chronic kidney disease: Secondary | ICD-10-CM | POA: Diagnosis not present

## 2022-03-29 DIAGNOSIS — Z7901 Long term (current) use of anticoagulants: Secondary | ICD-10-CM | POA: Diagnosis not present

## 2022-03-29 DIAGNOSIS — Z9981 Dependence on supplemental oxygen: Secondary | ICD-10-CM | POA: Diagnosis not present

## 2022-03-29 DIAGNOSIS — N183 Chronic kidney disease, stage 3 unspecified: Secondary | ICD-10-CM | POA: Diagnosis not present

## 2022-03-29 DIAGNOSIS — J449 Chronic obstructive pulmonary disease, unspecified: Secondary | ICD-10-CM | POA: Diagnosis not present

## 2022-03-29 DIAGNOSIS — E559 Vitamin D deficiency, unspecified: Secondary | ICD-10-CM | POA: Diagnosis not present

## 2022-03-29 DIAGNOSIS — E1121 Type 2 diabetes mellitus with diabetic nephropathy: Secondary | ICD-10-CM | POA: Diagnosis not present

## 2022-03-29 DIAGNOSIS — I429 Cardiomyopathy, unspecified: Secondary | ICD-10-CM | POA: Diagnosis not present

## 2022-03-29 DIAGNOSIS — J9611 Chronic respiratory failure with hypoxia: Secondary | ICD-10-CM | POA: Diagnosis not present

## 2022-03-29 DIAGNOSIS — G720 Drug-induced myopathy: Secondary | ICD-10-CM | POA: Diagnosis not present

## 2022-03-29 DIAGNOSIS — N2581 Secondary hyperparathyroidism of renal origin: Secondary | ICD-10-CM | POA: Diagnosis not present

## 2022-03-30 ENCOUNTER — Ambulatory Visit: Payer: Medicare HMO

## 2022-03-30 DIAGNOSIS — I428 Other cardiomyopathies: Secondary | ICD-10-CM

## 2022-03-30 LAB — CUP PACEART REMOTE DEVICE CHECK
Battery Remaining Longevity: 44 mo
Battery Remaining Percentage: 42 %
Battery Voltage: 2.98 V
Date Time Interrogation Session: 20240209052425
Implantable Lead Connection Status: 753985
Implantable Lead Connection Status: 753985
Implantable Lead Connection Status: 753985
Implantable Lead Implant Date: 20110127
Implantable Lead Implant Date: 20110127
Implantable Lead Implant Date: 20110127
Implantable Lead Location: 753858
Implantable Lead Location: 753859
Implantable Lead Location: 753860
Implantable Lead Model: 7122
Implantable Pulse Generator Implant Date: 20190418
Lead Channel Impedance Value: 610 Ohm
Lead Channel Impedance Value: 890 Ohm
Lead Channel Pacing Threshold Amplitude: 0.75 V
Lead Channel Pacing Threshold Amplitude: 0.875 V
Lead Channel Pacing Threshold Pulse Width: 0.5 ms
Lead Channel Pacing Threshold Pulse Width: 0.6 ms
Lead Channel Sensing Intrinsic Amplitude: 12 mV
Lead Channel Setting Pacing Amplitude: 2 V
Lead Channel Setting Pacing Amplitude: 2.5 V
Lead Channel Setting Pacing Pulse Width: 0.5 ms
Lead Channel Setting Pacing Pulse Width: 0.6 ms
Lead Channel Setting Sensing Sensitivity: 2 mV
Pulse Gen Model: 3222
Pulse Gen Serial Number: 9393517

## 2022-04-05 DIAGNOSIS — K219 Gastro-esophageal reflux disease without esophagitis: Secondary | ICD-10-CM | POA: Diagnosis not present

## 2022-04-05 DIAGNOSIS — E1121 Type 2 diabetes mellitus with diabetic nephropathy: Secondary | ICD-10-CM | POA: Diagnosis not present

## 2022-04-05 DIAGNOSIS — I10 Essential (primary) hypertension: Secondary | ICD-10-CM | POA: Diagnosis not present

## 2022-04-05 DIAGNOSIS — E785 Hyperlipidemia, unspecified: Secondary | ICD-10-CM | POA: Diagnosis not present

## 2022-04-06 DIAGNOSIS — J9611 Chronic respiratory failure with hypoxia: Secondary | ICD-10-CM | POA: Diagnosis not present

## 2022-04-06 DIAGNOSIS — I130 Hypertensive heart and chronic kidney disease with heart failure and stage 1 through stage 4 chronic kidney disease, or unspecified chronic kidney disease: Secondary | ICD-10-CM | POA: Diagnosis not present

## 2022-04-06 DIAGNOSIS — I5022 Chronic systolic (congestive) heart failure: Secondary | ICD-10-CM | POA: Diagnosis not present

## 2022-04-06 DIAGNOSIS — J449 Chronic obstructive pulmonary disease, unspecified: Secondary | ICD-10-CM | POA: Diagnosis not present

## 2022-04-06 DIAGNOSIS — Z7901 Long term (current) use of anticoagulants: Secondary | ICD-10-CM | POA: Diagnosis not present

## 2022-04-06 DIAGNOSIS — E785 Hyperlipidemia, unspecified: Secondary | ICD-10-CM | POA: Diagnosis not present

## 2022-04-06 DIAGNOSIS — E559 Vitamin D deficiency, unspecified: Secondary | ICD-10-CM | POA: Diagnosis not present

## 2022-04-06 DIAGNOSIS — G894 Chronic pain syndrome: Secondary | ICD-10-CM | POA: Diagnosis not present

## 2022-04-06 DIAGNOSIS — Z79891 Long term (current) use of opiate analgesic: Secondary | ICD-10-CM | POA: Diagnosis not present

## 2022-04-06 DIAGNOSIS — G720 Drug-induced myopathy: Secondary | ICD-10-CM | POA: Diagnosis not present

## 2022-04-06 DIAGNOSIS — Z794 Long term (current) use of insulin: Secondary | ICD-10-CM | POA: Diagnosis not present

## 2022-04-06 DIAGNOSIS — I429 Cardiomyopathy, unspecified: Secondary | ICD-10-CM | POA: Diagnosis not present

## 2022-04-06 DIAGNOSIS — N183 Chronic kidney disease, stage 3 unspecified: Secondary | ICD-10-CM | POA: Diagnosis not present

## 2022-04-06 DIAGNOSIS — D6869 Other thrombophilia: Secondary | ICD-10-CM | POA: Diagnosis not present

## 2022-04-06 DIAGNOSIS — F419 Anxiety disorder, unspecified: Secondary | ICD-10-CM | POA: Diagnosis not present

## 2022-04-06 DIAGNOSIS — M170 Bilateral primary osteoarthritis of knee: Secondary | ICD-10-CM | POA: Diagnosis not present

## 2022-04-06 DIAGNOSIS — E1122 Type 2 diabetes mellitus with diabetic chronic kidney disease: Secondary | ICD-10-CM | POA: Diagnosis not present

## 2022-04-06 DIAGNOSIS — F32A Depression, unspecified: Secondary | ICD-10-CM | POA: Diagnosis not present

## 2022-04-06 DIAGNOSIS — Z9981 Dependence on supplemental oxygen: Secondary | ICD-10-CM | POA: Diagnosis not present

## 2022-04-06 DIAGNOSIS — N2581 Secondary hyperparathyroidism of renal origin: Secondary | ICD-10-CM | POA: Diagnosis not present

## 2022-04-06 DIAGNOSIS — I480 Paroxysmal atrial fibrillation: Secondary | ICD-10-CM | POA: Diagnosis not present

## 2022-04-16 DIAGNOSIS — Z7901 Long term (current) use of anticoagulants: Secondary | ICD-10-CM | POA: Diagnosis not present

## 2022-04-16 DIAGNOSIS — D72829 Elevated white blood cell count, unspecified: Secondary | ICD-10-CM | POA: Diagnosis not present

## 2022-04-16 DIAGNOSIS — I480 Paroxysmal atrial fibrillation: Secondary | ICD-10-CM | POA: Diagnosis not present

## 2022-04-17 NOTE — Progress Notes (Signed)
Remote pacemaker transmission.   

## 2022-04-20 DIAGNOSIS — I130 Hypertensive heart and chronic kidney disease with heart failure and stage 1 through stage 4 chronic kidney disease, or unspecified chronic kidney disease: Secondary | ICD-10-CM | POA: Diagnosis not present

## 2022-04-20 DIAGNOSIS — E559 Vitamin D deficiency, unspecified: Secondary | ICD-10-CM | POA: Diagnosis not present

## 2022-04-20 DIAGNOSIS — D6869 Other thrombophilia: Secondary | ICD-10-CM | POA: Diagnosis not present

## 2022-04-20 DIAGNOSIS — J9611 Chronic respiratory failure with hypoxia: Secondary | ICD-10-CM | POA: Diagnosis not present

## 2022-04-20 DIAGNOSIS — N2581 Secondary hyperparathyroidism of renal origin: Secondary | ICD-10-CM | POA: Diagnosis not present

## 2022-04-20 DIAGNOSIS — N183 Chronic kidney disease, stage 3 unspecified: Secondary | ICD-10-CM | POA: Diagnosis not present

## 2022-04-20 DIAGNOSIS — G720 Drug-induced myopathy: Secondary | ICD-10-CM | POA: Diagnosis not present

## 2022-04-20 DIAGNOSIS — I429 Cardiomyopathy, unspecified: Secondary | ICD-10-CM | POA: Diagnosis not present

## 2022-04-20 DIAGNOSIS — Z9981 Dependence on supplemental oxygen: Secondary | ICD-10-CM | POA: Diagnosis not present

## 2022-04-20 DIAGNOSIS — Z7901 Long term (current) use of anticoagulants: Secondary | ICD-10-CM | POA: Diagnosis not present

## 2022-04-20 DIAGNOSIS — E785 Hyperlipidemia, unspecified: Secondary | ICD-10-CM | POA: Diagnosis not present

## 2022-04-20 DIAGNOSIS — J449 Chronic obstructive pulmonary disease, unspecified: Secondary | ICD-10-CM | POA: Diagnosis not present

## 2022-04-20 DIAGNOSIS — I480 Paroxysmal atrial fibrillation: Secondary | ICD-10-CM | POA: Diagnosis not present

## 2022-04-20 DIAGNOSIS — Z79891 Long term (current) use of opiate analgesic: Secondary | ICD-10-CM | POA: Diagnosis not present

## 2022-04-20 DIAGNOSIS — F419 Anxiety disorder, unspecified: Secondary | ICD-10-CM | POA: Diagnosis not present

## 2022-04-20 DIAGNOSIS — F32A Depression, unspecified: Secondary | ICD-10-CM | POA: Diagnosis not present

## 2022-04-20 DIAGNOSIS — M170 Bilateral primary osteoarthritis of knee: Secondary | ICD-10-CM | POA: Diagnosis not present

## 2022-04-20 DIAGNOSIS — I5022 Chronic systolic (congestive) heart failure: Secondary | ICD-10-CM | POA: Diagnosis not present

## 2022-04-20 DIAGNOSIS — E1122 Type 2 diabetes mellitus with diabetic chronic kidney disease: Secondary | ICD-10-CM | POA: Diagnosis not present

## 2022-04-20 DIAGNOSIS — G894 Chronic pain syndrome: Secondary | ICD-10-CM | POA: Diagnosis not present

## 2022-04-20 DIAGNOSIS — I639 Cerebral infarction, unspecified: Secondary | ICD-10-CM

## 2022-04-20 DIAGNOSIS — Z794 Long term (current) use of insulin: Secondary | ICD-10-CM | POA: Diagnosis not present

## 2022-04-20 HISTORY — DX: Cerebral infarction, unspecified: I63.9

## 2022-04-25 DIAGNOSIS — J438 Other emphysema: Secondary | ICD-10-CM | POA: Diagnosis not present

## 2022-05-03 DIAGNOSIS — J9611 Chronic respiratory failure with hypoxia: Secondary | ICD-10-CM | POA: Diagnosis not present

## 2022-05-03 DIAGNOSIS — J449 Chronic obstructive pulmonary disease, unspecified: Secondary | ICD-10-CM | POA: Diagnosis not present

## 2022-05-03 DIAGNOSIS — Z9981 Dependence on supplemental oxygen: Secondary | ICD-10-CM | POA: Diagnosis not present

## 2022-05-03 DIAGNOSIS — Z7901 Long term (current) use of anticoagulants: Secondary | ICD-10-CM | POA: Diagnosis not present

## 2022-05-03 DIAGNOSIS — E785 Hyperlipidemia, unspecified: Secondary | ICD-10-CM | POA: Diagnosis not present

## 2022-05-03 DIAGNOSIS — I429 Cardiomyopathy, unspecified: Secondary | ICD-10-CM | POA: Diagnosis not present

## 2022-05-03 DIAGNOSIS — Z79891 Long term (current) use of opiate analgesic: Secondary | ICD-10-CM | POA: Diagnosis not present

## 2022-05-03 DIAGNOSIS — G894 Chronic pain syndrome: Secondary | ICD-10-CM | POA: Diagnosis not present

## 2022-05-03 DIAGNOSIS — Z794 Long term (current) use of insulin: Secondary | ICD-10-CM | POA: Diagnosis not present

## 2022-05-03 DIAGNOSIS — E1122 Type 2 diabetes mellitus with diabetic chronic kidney disease: Secondary | ICD-10-CM | POA: Diagnosis not present

## 2022-05-03 DIAGNOSIS — I130 Hypertensive heart and chronic kidney disease with heart failure and stage 1 through stage 4 chronic kidney disease, or unspecified chronic kidney disease: Secondary | ICD-10-CM | POA: Diagnosis not present

## 2022-05-03 DIAGNOSIS — G720 Drug-induced myopathy: Secondary | ICD-10-CM | POA: Diagnosis not present

## 2022-05-03 DIAGNOSIS — E559 Vitamin D deficiency, unspecified: Secondary | ICD-10-CM | POA: Diagnosis not present

## 2022-05-03 DIAGNOSIS — N2581 Secondary hyperparathyroidism of renal origin: Secondary | ICD-10-CM | POA: Diagnosis not present

## 2022-05-03 DIAGNOSIS — D6869 Other thrombophilia: Secondary | ICD-10-CM | POA: Diagnosis not present

## 2022-05-03 DIAGNOSIS — M170 Bilateral primary osteoarthritis of knee: Secondary | ICD-10-CM | POA: Diagnosis not present

## 2022-05-03 DIAGNOSIS — N183 Chronic kidney disease, stage 3 unspecified: Secondary | ICD-10-CM | POA: Diagnosis not present

## 2022-05-03 DIAGNOSIS — F32A Depression, unspecified: Secondary | ICD-10-CM | POA: Diagnosis not present

## 2022-05-03 DIAGNOSIS — F419 Anxiety disorder, unspecified: Secondary | ICD-10-CM | POA: Diagnosis not present

## 2022-05-03 DIAGNOSIS — I5022 Chronic systolic (congestive) heart failure: Secondary | ICD-10-CM | POA: Diagnosis not present

## 2022-05-03 DIAGNOSIS — I480 Paroxysmal atrial fibrillation: Secondary | ICD-10-CM | POA: Diagnosis not present

## 2022-05-09 DIAGNOSIS — E785 Hyperlipidemia, unspecified: Secondary | ICD-10-CM | POA: Diagnosis not present

## 2022-05-09 DIAGNOSIS — I5022 Chronic systolic (congestive) heart failure: Secondary | ICD-10-CM | POA: Diagnosis not present

## 2022-05-09 DIAGNOSIS — M170 Bilateral primary osteoarthritis of knee: Secondary | ICD-10-CM | POA: Diagnosis not present

## 2022-05-09 DIAGNOSIS — F419 Anxiety disorder, unspecified: Secondary | ICD-10-CM | POA: Diagnosis not present

## 2022-05-09 DIAGNOSIS — F32A Depression, unspecified: Secondary | ICD-10-CM | POA: Diagnosis not present

## 2022-05-09 DIAGNOSIS — N183 Chronic kidney disease, stage 3 unspecified: Secondary | ICD-10-CM | POA: Diagnosis not present

## 2022-05-09 DIAGNOSIS — G720 Drug-induced myopathy: Secondary | ICD-10-CM | POA: Diagnosis not present

## 2022-05-09 DIAGNOSIS — J449 Chronic obstructive pulmonary disease, unspecified: Secondary | ICD-10-CM | POA: Diagnosis not present

## 2022-05-09 DIAGNOSIS — Z7901 Long term (current) use of anticoagulants: Secondary | ICD-10-CM | POA: Diagnosis not present

## 2022-05-09 DIAGNOSIS — I130 Hypertensive heart and chronic kidney disease with heart failure and stage 1 through stage 4 chronic kidney disease, or unspecified chronic kidney disease: Secondary | ICD-10-CM | POA: Diagnosis not present

## 2022-05-09 DIAGNOSIS — I480 Paroxysmal atrial fibrillation: Secondary | ICD-10-CM | POA: Diagnosis not present

## 2022-05-09 DIAGNOSIS — Z794 Long term (current) use of insulin: Secondary | ICD-10-CM | POA: Diagnosis not present

## 2022-05-09 DIAGNOSIS — Z79891 Long term (current) use of opiate analgesic: Secondary | ICD-10-CM | POA: Diagnosis not present

## 2022-05-09 DIAGNOSIS — N2581 Secondary hyperparathyroidism of renal origin: Secondary | ICD-10-CM | POA: Diagnosis not present

## 2022-05-09 DIAGNOSIS — D6869 Other thrombophilia: Secondary | ICD-10-CM | POA: Diagnosis not present

## 2022-05-09 DIAGNOSIS — G894 Chronic pain syndrome: Secondary | ICD-10-CM | POA: Diagnosis not present

## 2022-05-09 DIAGNOSIS — I429 Cardiomyopathy, unspecified: Secondary | ICD-10-CM | POA: Diagnosis not present

## 2022-05-09 DIAGNOSIS — Z9981 Dependence on supplemental oxygen: Secondary | ICD-10-CM | POA: Diagnosis not present

## 2022-05-09 DIAGNOSIS — E1122 Type 2 diabetes mellitus with diabetic chronic kidney disease: Secondary | ICD-10-CM | POA: Diagnosis not present

## 2022-05-09 DIAGNOSIS — E559 Vitamin D deficiency, unspecified: Secondary | ICD-10-CM | POA: Diagnosis not present

## 2022-05-09 DIAGNOSIS — J9611 Chronic respiratory failure with hypoxia: Secondary | ICD-10-CM | POA: Diagnosis not present

## 2022-05-10 ENCOUNTER — Emergency Department (HOSPITAL_COMMUNITY): Payer: Medicare HMO

## 2022-05-10 ENCOUNTER — Other Ambulatory Visit: Payer: Self-pay

## 2022-05-10 ENCOUNTER — Observation Stay (HOSPITAL_COMMUNITY)
Admission: EM | Admit: 2022-05-10 | Discharge: 2022-05-12 | Disposition: A | Discharge status: Home / Self Care | Payer: Medicare HMO | Attending: Internal Medicine | Admitting: Internal Medicine

## 2022-05-10 DIAGNOSIS — E1122 Type 2 diabetes mellitus with diabetic chronic kidney disease: Secondary | ICD-10-CM | POA: Diagnosis not present

## 2022-05-10 DIAGNOSIS — Z79899 Other long term (current) drug therapy: Secondary | ICD-10-CM | POA: Insufficient documentation

## 2022-05-10 DIAGNOSIS — Z7901 Long term (current) use of anticoagulants: Secondary | ICD-10-CM | POA: Insufficient documentation

## 2022-05-10 DIAGNOSIS — R42 Dizziness and giddiness: Secondary | ICD-10-CM | POA: Diagnosis not present

## 2022-05-10 DIAGNOSIS — R791 Abnormal coagulation profile: Secondary | ICD-10-CM | POA: Insufficient documentation

## 2022-05-10 DIAGNOSIS — I480 Paroxysmal atrial fibrillation: Secondary | ICD-10-CM | POA: Diagnosis not present

## 2022-05-10 DIAGNOSIS — N1832 Chronic kidney disease, stage 3b: Secondary | ICD-10-CM | POA: Diagnosis present

## 2022-05-10 DIAGNOSIS — Z86711 Personal history of pulmonary embolism: Secondary | ICD-10-CM | POA: Insufficient documentation

## 2022-05-10 DIAGNOSIS — G459 Transient cerebral ischemic attack, unspecified: Secondary | ICD-10-CM

## 2022-05-10 DIAGNOSIS — I2510 Atherosclerotic heart disease of native coronary artery without angina pectoris: Secondary | ICD-10-CM | POA: Insufficient documentation

## 2022-05-10 DIAGNOSIS — Z7984 Long term (current) use of oral hypoglycemic drugs: Secondary | ICD-10-CM | POA: Diagnosis not present

## 2022-05-10 DIAGNOSIS — G4733 Obstructive sleep apnea (adult) (pediatric): Secondary | ICD-10-CM | POA: Diagnosis present

## 2022-05-10 DIAGNOSIS — I10 Essential (primary) hypertension: Secondary | ICD-10-CM | POA: Diagnosis not present

## 2022-05-10 DIAGNOSIS — E669 Obesity, unspecified: Secondary | ICD-10-CM | POA: Insufficient documentation

## 2022-05-10 DIAGNOSIS — R41841 Cognitive communication deficit: Secondary | ICD-10-CM | POA: Diagnosis not present

## 2022-05-10 DIAGNOSIS — R911 Solitary pulmonary nodule: Secondary | ICD-10-CM | POA: Diagnosis present

## 2022-05-10 DIAGNOSIS — Z6830 Body mass index (BMI) 30.0-30.9, adult: Secondary | ICD-10-CM | POA: Diagnosis not present

## 2022-05-10 DIAGNOSIS — J449 Chronic obstructive pulmonary disease, unspecified: Secondary | ICD-10-CM | POA: Diagnosis present

## 2022-05-10 DIAGNOSIS — E119 Type 2 diabetes mellitus without complications: Secondary | ICD-10-CM

## 2022-05-10 DIAGNOSIS — Z794 Long term (current) use of insulin: Secondary | ICD-10-CM | POA: Insufficient documentation

## 2022-05-10 DIAGNOSIS — F411 Generalized anxiety disorder: Secondary | ICD-10-CM | POA: Diagnosis present

## 2022-05-10 DIAGNOSIS — E876 Hypokalemia: Secondary | ICD-10-CM | POA: Insufficient documentation

## 2022-05-10 DIAGNOSIS — G4489 Other headache syndrome: Secondary | ICD-10-CM | POA: Diagnosis not present

## 2022-05-10 DIAGNOSIS — I129 Hypertensive chronic kidney disease with stage 1 through stage 4 chronic kidney disease, or unspecified chronic kidney disease: Secondary | ICD-10-CM | POA: Insufficient documentation

## 2022-05-10 DIAGNOSIS — R4781 Slurred speech: Secondary | ICD-10-CM | POA: Diagnosis not present

## 2022-05-10 DIAGNOSIS — R29818 Other symptoms and signs involving the nervous system: Secondary | ICD-10-CM | POA: Diagnosis present

## 2022-05-10 DIAGNOSIS — Z743 Need for continuous supervision: Secondary | ICD-10-CM | POA: Diagnosis not present

## 2022-05-10 DIAGNOSIS — I4891 Unspecified atrial fibrillation: Secondary | ICD-10-CM | POA: Diagnosis present

## 2022-05-10 DIAGNOSIS — I63233 Cerebral infarction due to unspecified occlusion or stenosis of bilateral carotid arteries: Secondary | ICD-10-CM | POA: Diagnosis not present

## 2022-05-10 DIAGNOSIS — Z87891 Personal history of nicotine dependence: Secondary | ICD-10-CM | POA: Diagnosis not present

## 2022-05-10 LAB — CBC
HCT: 42.7 % (ref 36.0–46.0)
Hemoglobin: 13.8 g/dL (ref 12.0–15.0)
MCH: 25.7 pg — ABNORMAL LOW (ref 26.0–34.0)
MCHC: 32.3 g/dL (ref 30.0–36.0)
MCV: 79.4 fL — ABNORMAL LOW (ref 80.0–100.0)
Platelets: 217 10*3/uL (ref 150–400)
RBC: 5.38 MIL/uL — ABNORMAL HIGH (ref 3.87–5.11)
RDW: 16 % — ABNORMAL HIGH (ref 11.5–15.5)
WBC: 10.6 10*3/uL — ABNORMAL HIGH (ref 4.0–10.5)
nRBC: 0 % (ref 0.0–0.2)

## 2022-05-10 LAB — COMPREHENSIVE METABOLIC PANEL
ALT: 14 U/L (ref 0–44)
AST: 18 U/L (ref 15–41)
Albumin: 4 g/dL (ref 3.5–5.0)
Alkaline Phosphatase: 75 U/L (ref 38–126)
Anion gap: 8 (ref 5–15)
BUN: 23 mg/dL (ref 8–23)
CO2: 25 mmol/L (ref 22–32)
Calcium: 9.7 mg/dL (ref 8.9–10.3)
Chloride: 103 mmol/L (ref 98–111)
Creatinine, Ser: 1.22 mg/dL — ABNORMAL HIGH (ref 0.44–1.00)
GFR, Estimated: 44 mL/min — ABNORMAL LOW (ref >60)
Glucose, Bld: 150 mg/dL — ABNORMAL HIGH (ref 70–99)
Potassium: 3.4 mmol/L — ABNORMAL LOW (ref 3.5–5.1)
Sodium: 136 mmol/L (ref 135–145)
Total Bilirubin: 0.6 mg/dL (ref 0.3–1.2)
Total Protein: 7.3 g/dL (ref 6.5–8.1)

## 2022-05-10 LAB — PROTIME-INR
INR: 1.6 — ABNORMAL HIGH (ref 0.8–1.2)
Prothrombin Time: 19.1 seconds — ABNORMAL HIGH (ref 11.4–15.2)

## 2022-05-10 NOTE — ED Triage Notes (Signed)
Pt BIB [facility] from home w c/o dizziness and slurred speech. Pt was standing at the sink talking to daughter when it happened. Pt has had dizzy spells for the past week and she feels in the front of her head. Pt had slurred speech for 10 mins.   EMS VS BP 180/110 P 80 O2 97% RA CBG 160

## 2022-05-10 NOTE — ED Provider Notes (Signed)
[facility] Provider Note   CSN: [id number] Arrival date & time: 05/10/22  2114     History  Chief Complaint  Patient presents with   Aphasia    Emily Abbott is a 82 y.o. female.  Patient is an 82 year old female with a past medical history of hypertension, diabetes, A-fib on Coumadin, COPD presenting to the emergency department with dizziness as well as an episode of slurred speech.  The patient states for the last week she has had on and off dizziness upon standing.  She states that she does not feel like she is going to pass out but rather feels like she is off balance.  She states that she does not have a room spinning sensation.  She denies any nausea or vomiting.  She states that her vision has appeared more blurred when she tries to read.  She states that today shortly prior to arrival she was talking to her daughter when she suddenly had difficulty speaking and had slurred speech and her daughter noted that the left side of her face appeared to be drooping and her left arm appeared weak and the patient additionally reported paresthesias in the left arm.  Patient's daughter states that his symptoms resolved on its own after a minute or 2.  Patient otherwise denies any recent fevers, nausea, vomiting or diarrhea.  The history is provided by the patient and a relative.       Home Medications Prior to Admission medications   Medication Sig Start Date End Date Taking? Authorizing Provider  acetaminophen (TYLENOL) 325 MG tablet Take 2 tablets (650 mg total) by mouth every 6 (six) hours as needed for mild pain or moderate pain. 06/18/17   Clovis Riley, MD  albuterol [facility] HFA) [phone] Base) MCG/ACT inhaler Inhale 1-2 puffs into the lungs every 6 (six) hours as needed for wheezing or shortness of breath. 10/10/21   Olalere, Adewale A, MD  ALPRAZolam (XANAX) 0.5 MG tablet Take 0.5 mg by mouth 2 (two) times daily as needed for anxiety or  sleep.     [provider]  Azelastine HCl 137 MCG/SPRAY SOLN Place 2 sprays into both nostrils at bedtime.    [provider]  calcium carbonate (TUMS EX) 750 MG chewable tablet Chew 2 tablets by mouth as needed for heartburn.    [provider]  carvedilol (COREG) 6.25 MG tablet Take 6.25 mg by mouth 2 (two) times daily with a meal.    [provider]  cholecalciferol (VITAMIN D3) 25 MCG (1000 UNIT) tablet Take 1,000 Units by mouth daily.    [provider]  diphenhydrAMINE (BENADRYL) 25 MG tablet Take 12.5 mg by mouth daily as needed for itching.    [provider]  ezetimibe (ZETIA) 10 MG tablet Take 10 mg by mouth daily.    [provider]  Fluticasone-Umeclidin-Vilant (TRELEGY ELLIPTA) 100-62.5-25 MCG/ACT AEPB Inhale 1 puff into the lungs daily. 10/10/21   Laurin Coder, MD  Fluticasone-Umeclidin-Vilant (TRELEGY ELLIPTA) 200-62.5-25 MCG/ACT AEPB Inhale 1 puff into the lungs daily. 06/21/21   Laurin Coder, MD  furosemide (LASIX) 40 MG tablet Take one tablet in the AM and one tablet after lunch (early afternoon). 08/29/21   Evans Lance, MD  HYDROcodone-acetaminophen (NORCO/VICODIN) 5-325 MG tablet Take 0.5-1 tablets by mouth at bedtime.    [provider]  Insulin Glargine (BASAGLAR KWIKPEN) 100 UNIT/ML Inject 14 Units into the skin daily. 07/15/19  [provider]  ipratropium-albuterol (DUONEB) 0.5-2.5 (3) MG/3ML SOLN Take 3 mLs by nebulization every 6 (six) hours as needed. 02/16/18   Caren Griffins, MD  linagliptin (TRADJENTA) 5 MG TABS tablet Take 5 mg by mouth daily.    [provider]  magnesium gluconate (MAGONATE) 500 MG tablet Take 500 mg by mouth daily as needed (for leg cramps).     [provider]  omeprazole (PRILOSEC) 40 MG capsule Take 40 mg by mouth daily.      [provider]  OXYGEN Inhale 1.5 L into the lungs at bedtime.     [provider]   PARoxetine (PAXIL) 30 MG tablet Take 30 mg by mouth daily.      [provider]  tiZANidine (ZANAFLEX) 2 MG tablet Take 2 mg by mouth at bedtime as needed for muscle spasms.     [provider]  warfarin (COUMADIN) 5 MG tablet Take 2.5-5 mg by mouth See admin instructions. Take 5 mg by mouth daily on Sunday and Thursday. Take 2.5 mg by mouth daily on all other days    [provider]      Allergies    Metformin and related, Adhesive [tape], Atorvastatin, and Sulfonamide derivatives    Review of Systems   Review of Systems  Physical Exam Updated Vital Signs BP (!) 180/96 (BP Location: Right Arm)   Pulse 74   Temp 98.3 F (36.8 C)   Resp 20   Ht 5\' 6"  (1.676 m)   Wt 83.5 kg   SpO2 100%   BMI 29.70 kg/m  Physical Exam Vitals and nursing note reviewed.  Constitutional:      General: She is not in acute distress.    Appearance: Normal appearance.  HENT:     Head: Normocephalic and atraumatic.     Nose: Nose normal.     Mouth/Throat:     Mouth: Mucous membranes are moist.     Pharynx: Oropharynx is clear.  Eyes:     Extraocular Movements: Extraocular movements intact.     Conjunctiva/sclera: Conjunctivae normal.     Pupils: Pupils are equal, round, and reactive to light.     Comments: No nystagmus  Cardiovascular:     Rate and Rhythm: Normal rate and regular rhythm.     Pulses: Normal pulses.     Heart sounds: Normal heart sounds.  Pulmonary:     Effort: Pulmonary effort is normal.     Breath sounds: Normal breath sounds.  Abdominal:     General: Abdomen is flat.     Palpations: Abdomen is soft.     Tenderness: There is no abdominal tenderness.  Musculoskeletal:        General: Normal range of motion.     Cervical back: Normal range of motion and neck supple.  Skin:    General: Skin is warm and dry.  Neurological:     General: No focal deficit present.     Mental Status: She is alert and oriented to person, place, and time.     Cranial  Nerves: No cranial nerve deficit.     Sensory: No sensory deficit.     Motor: No weakness.     Coordination: Coordination normal.     Comments: No drift in all 4 extremities Normal speech  Psychiatric:        Mood and Affect: Mood normal.        Behavior: Behavior normal.     ED Results / Procedures /  Treatments   Labs (all labs ordered are listed, but only abnormal results are displayed) Labs Reviewed  CBC - Abnormal; Notable for the following components:      Result Value   WBC 10.6 (*)    RBC 5.38 (*)    MCV 79.4 (*)    MCH 25.7 (*)    RDW 16.0 (*)    All other components within normal limits  COMPREHENSIVE METABOLIC PANEL - Abnormal; Notable for the following components:   Potassium 3.4 (*)    Glucose, Bld 150 (*)    Creatinine, Ser 1.22 (*)    GFR, Estimated 44 (*)    All other components within normal limits  PROTIME-INR - Abnormal; Notable for the following components:   Prothrombin Time 19.1 (*)    INR 1.6 (*)    All other components within normal limits  URINALYSIS, ROUTINE W REFLEX MICROSCOPIC    EKG None  Radiology DG Chest 2 View  Result Date: 05/10/2022 CLINICAL DATA:  Dizziness EXAM: CHEST - 2 VIEW COMPARISON:  07/17/2019 FINDINGS: Left pacer remains in place, unchanged. Prior CABG. Heart and mediastinal contours are within normal limits. No focal opacities or effusions. No acute bony abnormality. IMPRESSION: No active cardiopulmonary disease. Electronically Signed   By: Rolm Baptise M.D.   On: 05/10/2022 22:30    Procedures Procedures    Medications Ordered in ED Medications - No data to display  ED Course/ Medical Decision Making/ A&P Clinical Course as of 05/10/22 2343  Thu May 10, 2022  2342 Patient signed out to Dr. Betsey Holiday in stable condition pending CTA with plan for likely admission. [VK]    Clinical Course User Index [VK] Kemper Durie, DO                             Medical Decision Making This patient presents to the ED  with chief complaint(s) of dizziness, slurred speech with pertinent past medical history of HTN, NICM s/p ICD placement, COPD, A fib on coumadin which further complicates the presenting complaint. The complaint involves an extensive differential diagnosis and also carries with it a high risk of complications and morbidity.    The differential diagnosis includes TIA, CVA, ICH, mass effect, arrhythmia, anemia, electrolyte abnormality, hypo or hyperglycemia, infection  Additional history obtained: Additional history obtained from family Records reviewed outpatient cardiology records  ED Course and Reassessment: On patient's arrival to the emergency department she is hemodynamically stable no acute distress.  She has no focal neurologic deficits at this time, concerning for possible TIA and this has not made a stroke alert and is undergoing TIA workup.  Patient does have an ICD so she will have a CT head and CTA to evaluate for stroke as well as labs and EKG and urine and will be closely reassessed.  Independent labs interpretation:  The following labs were independently interpreted: within normal range  Independent visualization of imaging: - I independently visualized the following imaging with scope of interpretation limited to determining acute life threatening conditions related to emergency care: CXR, which revealed no acute disease      Amount and/or Complexity of Data Reviewed Labs: ordered. Radiology: ordered.          Final Clinical Impression(s) / ED Diagnoses Final diagnoses:  None    Rx / DC Orders ED Discharge Orders     None         Kemper Durie, DO 05/10/22 2343

## 2022-05-11 ENCOUNTER — Other Ambulatory Visit (HOSPITAL_COMMUNITY): Payer: Self-pay

## 2022-05-11 ENCOUNTER — Observation Stay (HOSPITAL_COMMUNITY): Payer: Medicare HMO

## 2022-05-11 ENCOUNTER — Observation Stay (HOSPITAL_BASED_OUTPATIENT_CLINIC_OR_DEPARTMENT_OTHER): Payer: Medicare HMO

## 2022-05-11 ENCOUNTER — Encounter (HOSPITAL_COMMUNITY): Payer: Self-pay | Admitting: Family Medicine

## 2022-05-11 ENCOUNTER — Other Ambulatory Visit (HOSPITAL_COMMUNITY): Payer: Medicare HMO

## 2022-05-11 ENCOUNTER — Emergency Department (HOSPITAL_COMMUNITY): Payer: Medicare HMO

## 2022-05-11 DIAGNOSIS — F411 Generalized anxiety disorder: Secondary | ICD-10-CM

## 2022-05-11 DIAGNOSIS — J439 Emphysema, unspecified: Secondary | ICD-10-CM

## 2022-05-11 DIAGNOSIS — G459 Transient cerebral ischemic attack, unspecified: Secondary | ICD-10-CM

## 2022-05-11 DIAGNOSIS — G4733 Obstructive sleep apnea (adult) (pediatric): Secondary | ICD-10-CM

## 2022-05-11 DIAGNOSIS — R911 Solitary pulmonary nodule: Secondary | ICD-10-CM

## 2022-05-11 DIAGNOSIS — I480 Paroxysmal atrial fibrillation: Secondary | ICD-10-CM

## 2022-05-11 DIAGNOSIS — N1832 Chronic kidney disease, stage 3b: Secondary | ICD-10-CM | POA: Diagnosis present

## 2022-05-11 DIAGNOSIS — R69 Illness, unspecified: Secondary | ICD-10-CM | POA: Diagnosis not present

## 2022-05-11 DIAGNOSIS — R29818 Other symptoms and signs involving the nervous system: Secondary | ICD-10-CM | POA: Diagnosis not present

## 2022-05-11 DIAGNOSIS — I63233 Cerebral infarction due to unspecified occlusion or stenosis of bilateral carotid arteries: Secondary | ICD-10-CM | POA: Diagnosis not present

## 2022-05-11 DIAGNOSIS — R918 Other nonspecific abnormal finding of lung field: Secondary | ICD-10-CM | POA: Diagnosis not present

## 2022-05-11 LAB — CBC WITH DIFFERENTIAL/PLATELET
Abs Immature Granulocytes: 0.02 10*3/uL (ref 0.00–0.07)
Basophils Absolute: 0.1 10*3/uL (ref 0.0–0.1)
Basophils Relative: 1 %
Eosinophils Absolute: 0.2 10*3/uL (ref 0.0–0.5)
Eosinophils Relative: 2 %
HCT: 42.7 % (ref 36.0–46.0)
Hemoglobin: 13.5 g/dL (ref 12.0–15.0)
Immature Granulocytes: 0 %
Lymphocytes Relative: 22 %
Lymphs Abs: 2.3 10*3/uL (ref 0.7–4.0)
MCH: 25.4 pg — ABNORMAL LOW (ref 26.0–34.0)
MCHC: 31.6 g/dL (ref 30.0–36.0)
MCV: 80.3 fL (ref 80.0–100.0)
Monocytes Absolute: 0.8 10*3/uL (ref 0.1–1.0)
Monocytes Relative: 8 %
Neutro Abs: 6.9 10*3/uL (ref 1.7–7.7)
Neutrophils Relative %: 67 %
Platelets: 237 10*3/uL (ref 150–400)
RBC: 5.32 MIL/uL — ABNORMAL HIGH (ref 3.87–5.11)
RDW: 16.3 % — ABNORMAL HIGH (ref 11.5–15.5)
WBC: 10.3 10*3/uL (ref 4.0–10.5)
nRBC: 0 % (ref 0.0–0.2)

## 2022-05-11 LAB — COMPREHENSIVE METABOLIC PANEL
ALT: 13 U/L (ref 0–44)
AST: 20 U/L (ref 15–41)
Albumin: 3.8 g/dL (ref 3.5–5.0)
Alkaline Phosphatase: 71 U/L (ref 38–126)
Anion gap: 9 (ref 5–15)
BUN: 23 mg/dL (ref 8–23)
CO2: 26 mmol/L (ref 22–32)
Calcium: 9.5 mg/dL (ref 8.9–10.3)
Chloride: 102 mmol/L (ref 98–111)
Creatinine, Ser: 1.36 mg/dL — ABNORMAL HIGH (ref 0.44–1.00)
GFR, Estimated: 39 mL/min — ABNORMAL LOW (ref >60)
Glucose, Bld: 165 mg/dL — ABNORMAL HIGH (ref 70–99)
Potassium: 3.4 mmol/L — ABNORMAL LOW (ref 3.5–5.1)
Sodium: 137 mmol/L (ref 135–145)
Total Bilirubin: 0.7 mg/dL (ref 0.3–1.2)
Total Protein: 6.8 g/dL (ref 6.5–8.1)

## 2022-05-11 LAB — ECHOCARDIOGRAM COMPLETE
AR max vel: 1.56 cm2
AV Area VTI: 1.68 cm2
AV Area mean vel: 1.51 cm2
AV Mean grad: 10 mmHg
AV Peak grad: 14.4 mmHg
Ao pk vel: 1.9 m/s
Area-P 1/2: 2.93 cm2
Calc EF: 43.3 %
Height: 66 in
MV M vel: 4.19 m/s
MV Peak grad: 70.2 mmHg
S' Lateral: 3.5 cm
Single Plane A2C EF: 39 %
Single Plane A4C EF: 26.9 %
Weight: 2944 oz

## 2022-05-11 LAB — CBG MONITORING, ED
Glucose-Capillary: 162 mg/dL — ABNORMAL HIGH (ref 70–99)
Glucose-Capillary: 202 mg/dL — ABNORMAL HIGH (ref 70–99)

## 2022-05-11 LAB — MAGNESIUM: Magnesium: 2.4 mg/dL (ref 1.7–2.4)

## 2022-05-11 LAB — LIPID PANEL
Cholesterol: 266 mg/dL — ABNORMAL HIGH (ref 0–200)
HDL: 46 mg/dL (ref >40)
LDL Cholesterol: 177 mg/dL — ABNORMAL HIGH (ref 0–99)
Total CHOL/HDL Ratio: 5.8 RATIO
Triglycerides: 214 mg/dL — ABNORMAL HIGH (ref <150)
VLDL: 43 mg/dL — ABNORMAL HIGH (ref 0–40)

## 2022-05-11 LAB — PHOSPHORUS: Phosphorus: 3.9 mg/dL (ref 2.5–4.6)

## 2022-05-11 LAB — HEPARIN LEVEL (UNFRACTIONATED)
Heparin Unfractionated: 0.25 IU/mL — ABNORMAL LOW (ref 0.30–0.70)
Heparin Unfractionated: 0.34 IU/mL (ref 0.30–0.70)

## 2022-05-11 LAB — GLUCOSE, CAPILLARY
Glucose-Capillary: 221 mg/dL — ABNORMAL HIGH (ref 70–99)
Glucose-Capillary: 185 mg/dL — ABNORMAL HIGH (ref 70–99)

## 2022-05-11 MED ORDER — ALBUTEROL SULFATE (2.5 MG/3ML) 0.083% IN NEBU: 3.0000 mL | INHALATION_SOLUTION | Freq: Four times a day (QID) | RESPIRATORY_TRACT | Status: DC | PRN

## 2022-05-11 MED ORDER — PANTOPRAZOLE SODIUM 40 MG PO TBEC
40.0000 mg | DELAYED_RELEASE_TABLET | Freq: Every day | ORAL | Status: DC
  Administered 2022-05-11 – 2022-05-12 (×2): 40 mg via ORAL
  Filled 2022-05-11 (×2): qty 1

## 2022-05-11 MED ORDER — ACETAMINOPHEN 650 MG RE SUPP: 650.0000 mg | RECTAL | Status: DC | PRN

## 2022-05-11 MED ORDER — SENNOSIDES-DOCUSATE SODIUM 8.6-50 MG PO TABS: 1.0000 | ORAL_TABLET | Freq: Every evening | ORAL | Status: DC | PRN

## 2022-05-11 MED ORDER — SODIUM CHLORIDE 0.9% FLUSH: 3.0000 mL | INTRAVENOUS | Status: DC | PRN

## 2022-05-11 MED ORDER — ACETAMINOPHEN 160 MG/5ML PO SOLN: 650.0000 mg | ORAL | Status: DC | PRN

## 2022-05-11 MED ORDER — FLUTICASONE FUROATE-VILANTEROL 200-25 MCG/ACT IN AEPB
1.0000 | INHALATION_SPRAY | Freq: Every day | RESPIRATORY_TRACT | Status: DC
  Administered 2022-05-11 – 2022-05-12 (×2): 1 via RESPIRATORY_TRACT
  Filled 2022-05-11 (×2): qty 28

## 2022-05-11 MED ORDER — IOHEXOL 350 MG/ML SOLN
75.0000 mL | Freq: Once | INTRAVENOUS | Status: AC | PRN
  Administered 2022-05-11: 75 mL via INTRAVENOUS

## 2022-05-11 MED ORDER — INSULIN GLARGINE-YFGN 100 UNIT/ML ~~LOC~~ SOLN
8.0000 [IU] | Freq: Every day | SUBCUTANEOUS | Status: DC
  Administered 2022-05-11 – 2022-05-12 (×2): 8 [IU] via SUBCUTANEOUS
  Filled 2022-05-11 (×2): qty 0.08

## 2022-05-11 MED ORDER — UMECLIDINIUM BROMIDE 62.5 MCG/ACT IN AEPB
1.0000 | INHALATION_SPRAY | Freq: Every day | RESPIRATORY_TRACT | Status: DC
  Administered 2022-05-11 – 2022-05-12 (×2): 1 via RESPIRATORY_TRACT
  Filled 2022-05-11 (×2): qty 7

## 2022-05-11 MED ORDER — HEPARIN (PORCINE) 25000 UT/250ML-% IV SOLN
1050.0000 [IU]/h | INTRAVENOUS | Status: DC
  Administered 2022-05-11: 950 [IU]/h via INTRAVENOUS
  Administered 2022-05-11: 1050 [IU]/h via INTRAVENOUS
  Filled 2022-05-11 (×2): qty 250

## 2022-05-11 MED ORDER — PAROXETINE HCL 30 MG PO TABS
30.0000 mg | ORAL_TABLET | Freq: Every day | ORAL | Status: DC
  Administered 2022-05-11 – 2022-05-12 (×2): 30 mg via ORAL
  Filled 2022-05-11 (×2): qty 1

## 2022-05-11 MED ORDER — STROKE: EARLY STAGES OF RECOVERY BOOK
Freq: Once | Status: AC
  Filled 2022-05-11 (×2): qty 1

## 2022-05-11 MED ORDER — POTASSIUM CHLORIDE CRYS ER 20 MEQ PO TBCR
40.0000 meq | EXTENDED_RELEASE_TABLET | Freq: Two times a day (BID) | ORAL | Status: AC
  Administered 2022-05-11 – 2022-05-12 (×2): 40 meq via ORAL
  Filled 2022-05-11 (×2): qty 2

## 2022-05-11 MED ORDER — ALPRAZOLAM 0.5 MG PO TABS: 0.5000 mg | ORAL_TABLET | Freq: Two times a day (BID) | ORAL | Status: DC | PRN

## 2022-05-11 MED ORDER — INSULIN ASPART 100 UNIT/ML IJ SOLN: 0.0000 [IU] | Freq: Every day | INTRAMUSCULAR | Status: DC

## 2022-05-11 MED ORDER — SODIUM CHLORIDE 0.9 % IV SOLN: 250.0000 mL | INTRAVENOUS | Status: DC | PRN

## 2022-05-11 MED ORDER — SODIUM CHLORIDE 0.9% FLUSH
3.0000 mL | Freq: Two times a day (BID) | INTRAVENOUS | Status: DC
  Administered 2022-05-11 – 2022-05-12 (×3): 3 mL via INTRAVENOUS

## 2022-05-11 MED ORDER — INSULIN ASPART 100 UNIT/ML IJ SOLN
0.0000 [IU] | Freq: Three times a day (TID) | INTRAMUSCULAR | Status: DC
  Administered 2022-05-11: 1 [IU] via SUBCUTANEOUS
  Administered 2022-05-11 – 2022-05-12 (×5): 2 [IU] via SUBCUTANEOUS

## 2022-05-11 MED ORDER — ACETAMINOPHEN 325 MG PO TABS: 650.0000 mg | ORAL_TABLET | ORAL | Status: DC | PRN

## 2022-05-11 MED ORDER — HYDROCODONE-ACETAMINOPHEN 5-325 MG PO TABS
1.0000 | ORAL_TABLET | Freq: Four times a day (QID) | ORAL | Status: DC | PRN
  Administered 2022-05-11: 1 via ORAL
  Filled 2022-05-11: qty 1

## 2022-05-11 NOTE — Evaluation (Signed)
Physical Therapy Evaluation Patient Details Name: Emily Abbott MRN: [id number] DOB: 12/28/1940 Today's Date: 05/11/2022  History of Present Illness  Emily Abbott is a 82 y.o. female admitted with transient left arm numbness. She states that it lasted about 5 minutes, who presents with she feels now like she is completely back to her baseline.  In the emergency department, she received workup with CTA which reveals multiple areas of severe disease as well as which was subtherapeutic  stenosis in M1 right MCA.  PMH  of atrial fibrillation who takes Coumadin, also with diabetes, hypertension hyperlipidemia and bjilateral knee arthritis.   Clinical Impression  Emily Abbott is 82 y.o. female admitted with above HPI and diagnosis. Patient is currently limited by functional impairments below (see PT problem list). Patient lives alone and is independent with use of Rollator at baseline. Currently pt is mobilizing at supervision to min guard level with use of RW for gait. RW improves pt's stability during ambulation compared to no device. Patient will benefit from continued skilled PT interventions to address impairments and progress independence with mobility, recommending HHPT. Acute PT will follow and progress as able.        Recommendations for follow up therapy are one component of a multi-disciplinary discharge planning process, led by the attending physician.  Recommendations may be updated based on patient status, additional functional criteria and insurance authorization.  Follow Up Recommendations Home health PT      Assistance Recommended at Discharge Intermittent Supervision/Assistance  Patient can return home with the following  A little help with walking and/or transfers;A little help with bathing/dressing/bathroom;Assist for transportation;Help with stairs or ramp for entrance    Equipment Recommendations None recommended by PT  Recommendations for Other Services        Functional Status Assessment Patient has had a recent decline in their functional status and demonstrates the ability to make significant improvements in function in a reasonable and predictable amount of time.     Precautions / Restrictions Precautions Precautions: Fall Precaution Comments: SBP <180 Restrictions Weight Bearing Restrictions: No      Mobility  Bed Mobility Overal bed mobility: Needs Assistance Bed Mobility: Supine to Sit, Sit to Supine     Supine to sit: Supervision Sit to supine: Supervision   General bed mobility comments: sup for safety, HOB slightly elevated    Transfers Overall transfer level: Needs assistance Equipment used: Rolling walker (2 wheels), None Transfers: Sit to/from Stand Sit to Stand: Min guard           General transfer comment: min guard for power up from stretcher, use of bil UE's. Pt completed 5x sit<>stand with bil UE's in 19 seconds.    Ambulation/Gait Ambulation/Gait assistance: Min guard Gait Distance (Feet): 200 Feet Assistive device: Rolling walker (2 wheels), None Gait Pattern/deviations: Step-through pattern, Decreased stride length Gait velocity: decr     General Gait Details: guarding throughout with RW and no device. pt demonstrates safe management of RW, no LOB throughout. slightly wider BOS when ambulating with no device to improve stability.  Stairs            Wheelchair Mobility    Modified Rankin (Stroke Patients Only)       Balance Overall balance assessment: Needs assistance Sitting-balance support: Feet unsupported Sitting balance-Leahy Scale: Fair     Standing balance support: Bilateral upper extremity supported, Reliant on assistive device for balance, No upper extremity supported, During functional activity Standing balance-Leahy Scale: Fair  Pertinent Vitals/Pain Pain Assessment Pain Assessment: No/denies pain    Home Living  Family/patient expects to be discharged to:: Private residence Living Arrangements: Alone Available Help at Discharge: Available PRN/intermittently (lives next to daughter and son-in-law) Type of Home: Apartment Home Access: Stairs to enter Entrance Stairs-Rails: Right (pull bar on the apartmentt exterior surface to help) Entrance Stairs-Number of Steps: 2   Home Layout: One level Home Equipment: Shower seat;Grab bars - tub/shower;Grab bars - toilet;Rollator (4 wheels);Rolling Walker (2 wheels);Cane - quad      Prior Function Prior Level of Function : Independent/Modified Independent             Mobility Comments: Uses rollator sometimes when her knees are painful ADLs Comments: modified independent for selfcare.  Family brings meals to her if she doesnt fix something.  Her daughter assist with cleaning at times.     Hand Dominance   Dominant Hand: Right    Extremity/Trunk Assessment   Upper Extremity Assessment Upper Extremity Assessment: Defer to OT evaluation    Lower Extremity Assessment Lower Extremity Assessment: Overall WFL for tasks assessed    Cervical / Trunk Assessment Cervical / Trunk Assessment: Normal  Communication   Communication: No difficulties  Cognition Arousal/Alertness: Awake/alert Behavior During Therapy: WFL for tasks assessed/performed Overall Cognitive Status: Within Functional Limits for tasks assessed                                          General Comments      Exercises     Assessment/Plan    PT Assessment Patient needs continued PT services  PT Problem List Decreased strength;Decreased activity tolerance;Decreased balance;Decreased mobility;Decreased knowledge of use of DME       PT Treatment Interventions DME instruction;Neuromuscular re-education;Balance training;Therapeutic exercise;Therapeutic activities;Functional mobility training;Stair training;Gait training;Patient/family education    PT Goals  (Current goals can be found in the Care Plan section)  Acute Rehab PT Goals Patient Stated Goal: get home PT Goal Formulation: With patient Time For Goal Achievement: 05/25/22 Potential to Achieve Goals: Good    Frequency Min 3X/week     Co-evaluation               AM-PAC PT "6 Clicks" Mobility  Outcome Measure Help needed turning from your back to your side while in a flat bed without using bedrails?: A Little Help needed moving from lying on your back to sitting on the side of a flat bed without using bedrails?: A Little Help needed moving to and from a bed to a chair (including a wheelchair)?: A Little Help needed standing up from a chair using your arms (e.g., wheelchair or bedside chair)?: A Little Help needed to walk in hospital room?: A Little Help needed climbing 3-5 steps with a railing? : A Little 6 Click Score: 18    End of Session Equipment Utilized During Treatment: Gait belt Activity Tolerance: Patient tolerated treatment well Patient left: in bed;with call bell/phone within reach Nurse Communication: Mobility status PT Visit Diagnosis: Unsteadiness on feet (R26.81);Muscle weakness (generalized) (M62.81);Difficulty in walking, not elsewhere classified (R26.2);Other symptoms and signs involving the nervous system (R29.898)    Time: [id number] PT Time Calculation (min) (ACUTE ONLY): 17 min   Charges:   PT Evaluation $PT Eval Low Complexity: 1 Low          Verner Mould, DPT Acute Rehabilitation Services Office [phone]  05/11/22 11:55  AM  

## 2022-05-11 NOTE — Progress Notes (Signed)
Care started prior to midnight in the emergency room patient was admitted early this morning after midnight by Dr. Christia Reading Opyd and I am in current agreement with his assessment and plan.  This no changes to the plan of care been made accordingly.  The patient is a obese Caucasian female with a past medical history significant for but limited to atrial fibrillation on anticoagulation with Coumadin, CKD stage IIIa, COPD, OSA, diabetes mellitus type 2, anxiety and hypertension as well as other comorbidities presented to the ED for evaluation of slurred speech, left facial droop and left arm weakness.  She reported that she been experiencing a headache and dizziness intermittently for over a week has been in her usual state of health otherwise until yesterday evening when she had slurred speech and left facial droop.  She had felt her arm was weak and numb.  EMS was called and blood pressure was found to be elevated 180/110 and she brought to the ED.  Symptoms resolved approximately after 5 minutes.  Upon arrival to ED she is found to be afebrile and saturating well on room air with normal heart rate and elevated blood pressure.  EKG demonstrated a flutter with left bundle branch block.  Chest x-ray was negative for any acute cardiopulmonary disease and a head CT was negative for acute intracranial abnormalities.  CTA was done and was negative for LVO or any other emergent findings.  She was noted to have a right upper lobe lung nodule that was incidentally found on CT scan.  Labs were most notable for creatinine 1.22 and an INR 1.6.  Neurology was consulted in the ED and she was started on IV heparin for stroke protocol.  MRI was ordered however unable to be done given that her pacer is compatible.  Neurology recommending likely starting an oral anticoagulant and possibly recommending Eliquis.  She underwent further stroke evaluation with an A1c, telemetry as well as echocardiogram and stroke team will see the patient  in the morning.  Currently she is being admitted and treated for the following but not limited to:  Transient speech difficulty and left-sided weakness  - Back to baseline in ED; no acute findings on head CT; no LVO or other emergent CTA finding  -Continue cardiac monitoring and frequent neuro checks, c -MRI brain not able to be done as PPM is Not compatible, -Check echo, A1c, and lipids, consult PT/OT/SLP   -Repeat CT Scan of Head in the AM    Paroxysmal atrial fibrillation; subtherapeutic INR  -INR 1.6 in ED  -Neurology recommends IV heparin with stroke protocol   -Further anticoagulation recommendations Neurology   COPD; OSA  -Not in exacerbation on admission  -Continue ICS-LAMA-LABA and as-needed albuterol, continue CPAP qHS  -SpO2: 95 % -Continue to Monitor Respiratory Status carefully   Anxiety  -Continue Paroxetine 30 mg p.o. daily  Diabetes Mellitus Type 2 -Continue with Semglee 8 units subcu daily, as well as very sensitive NovoLog/scale insulin ACHS -Hemoglobin A1c in the a.m. -CBG Trend: Recent Labs  Lab 05/11/22 0814 05/11/22 1206 05/11/22 1618  GLUCAP 162* 202* 221*   HLD -Lipid Panel done and showed cholesterol/HDL ratio 5.8, cholesterol level 266, HDL 46, LDL 177, triglycerides of 214, VLDL 43 -Not on Atorvastatin due to Side-effects -Will defer to Neuro to Initiate Statin   CKD IIIb  -Appears close to baseline  -BUN/Cr Trend: Recent Labs  Lab 05/10/22 2237 05/11/22 1057  BUN 23 23  CREATININE 1.22* 1.36*  -Avoid Nephrotoxic Medications, Contrast  Dyes, Hypotension and Dehydration to Ensure Adequate Renal Perfusion and will need to Renally Adjust Meds -Continue to Monitor and Trend Renal Function carefully and repeat CMP in the AM   Hypokalemia -Patient's K+ Level Trend: Recent Labs  Lab 05/10/22 2237 05/11/22 1057  K 3.4* 3.4*  -Replete with po Kcl 40 mEQ BID x2 -Continue to Monitor and Replete as Necessary -Repeat CMP in the AM   Lung  Nodule  -Noted incidentally on CT in ED  -Discussed with patient and her daughters, outpatient follow-up with her pulmonologist recommended    GERD/GI Prophylaxis -C/w Pantoprazole 40 mg po Daily  Obesity -Complicates overall prognosis and care -Estimated body mass index is 30 kg/m as calculated from the following:   Height as of this encounter: 5\' 6"  (1.676 m).   Weight as of this encounter: 84.3 kg.  -Weight Loss and Dietary Counseling given  Continue to monitor patient's clinical response to intervention and repeat head CT in the a.m. and follow-up on neurology recommendations

## 2022-05-11 NOTE — Progress Notes (Signed)
ANTICOAGULATION CONSULT NOTE  Pharmacy Consult for Heparin  Indication: atrial fibrillation  Allergies  Allergen Reactions   Metformin And Related Other (See Comments)    "caused me kidney problems"    Adhesive [Tape] Other (See Comments)    Burning sensation   Atorvastatin Other (See Comments)    Makes legs hurt    Sulfonamide Derivatives Other (See Comments)    itch    Patient Measurements: Height: 5\' 6"  (167.6 cm) Weight: 83.5 kg (184 lb) IBW/kg (Calculated) : 59.3  Vital Signs: Temp: 97.7 F (36.5 C) (03/22 0946) Temp Source: Oral (03/22 0946) BP: 150/126 (03/22 1100) Pulse Rate: 71 (03/22 1100)  Labs: Recent Labs    05/10/22 2237 05/11/22 1057  HGB 13.8 13.5  HCT 42.7 42.7  PLT 217 237  LABPROT 19.1*  --   INR 1.6*  --   HEPARINUNFRC  --  0.25*  CREATININE 1.22* 1.36*     Estimated Creatinine Clearance: 34.1 mL/min (A) (by C-G formula based on SCr of 1.36 mg/dL (H)).   Medical History: Past Medical History:  Diagnosis Date   Anemia    Anxiety    CAD (coronary artery disease)    Chronic kidney disease    CKD stage 3    COPD (chronic obstructive pulmonary disease) (HCC)    uses  1 to and1.5 L of o2 at bedtime ; has not seen her pulm doctor in over a year ; has not had to uses inhlaer in a over a year     Diabetes mellitus    type II   GERD (gastroesophageal reflux disease)    HTN (hypertension)    essential   Hyperlipidemia, mixed    Mitral valve disorder    Osteoarthritis    Panic attack    Paroxysmal atrial fibrillation ([place])    Pneumonia    2010   Pulmonary embolism ([place]) 2010   Sleep apnea    does not uses cpap since she got a URI from it. has home 02 as needed     Assessment: 82 y/o F with TIA subtherapeutic on warfarin (INR 1.6 on admit). On warfarin for Afib. Pharmacy consulted to dose heparin - stroke protocol (no bolus, low goal)  Heparin level 0.25 units/mL (subtherapeutic) on heparin 950 units/hr. CBC WNL with no signs of  bleeding.  Goal of Therapy:  Heparin level 0.3-0.5 units/ml Monitor platelets by anticoagulation protocol: Yes   Plan:  Increase heparin to 1050 units/hr Check 8hr heparin level at 2100 Daily CBC and heparin level Follow up long-term [facility] plans: neurology potentially wanting to start Eliquis. Patient may need assistance with cost upon discharge.  Erskine Speed, PharmD Clinical Pharmacist

## 2022-05-11 NOTE — Progress Notes (Signed)
ANTICOAGULATION CONSULT NOTE - Initial Consult  Pharmacy Consult for Heparin  Indication: atrial fibrillation  Allergies  Allergen Reactions   Metformin And Related Other (See Comments)    "caused me kidney problems"    Adhesive [Tape] Other (See Comments)    Burning sensation   Atorvastatin Other (See Comments)    Makes legs hurt    Sulfonamide Derivatives Other (See Comments)    itch    Patient Measurements: Height: 5\' 6"  (167.6 cm) Weight: 83.5 kg (184 lb) IBW/kg (Calculated) : 59.3  Vital Signs: Temp: 97.9 F (36.6 C) (03/22 0115) Temp Source: Oral (03/22 0115) BP: 140/73 (03/22 0300) Pulse Rate: 78 (03/22 0300)  Labs: Recent Labs    05/10/22 2237  HGB 13.8  HCT 42.7  PLT 217  LABPROT 19.1*  INR 1.6*  CREATININE 1.22*    Estimated Creatinine Clearance: 38.1 mL/min (A) (by C-G formula based on SCr of 1.22 mg/dL (H)).   Medical History: Past Medical History:  Diagnosis Date   Anemia    Anxiety    CAD (coronary artery disease)    Chronic kidney disease    CKD stage 3    COPD (chronic obstructive pulmonary disease) (HCC)    uses  1 to and1.5 L of o2 at bedtime ; has not seen her pulm doctor in over a year ; has not had to uses inhlaer in a over a year     Diabetes mellitus    type II   GERD (gastroesophageal reflux disease)    HTN (hypertension)    essential   Hyperlipidemia, mixed    Mitral valve disorder    Osteoarthritis    Panic attack    Paroxysmal atrial fibrillation ([place])    Pneumonia    2010   Pulmonary embolism ([place]) 2010   Sleep apnea    does not uses cpap since she got a URI from it. has home 02 as needed     Assessment: 82 y/o F with ?TIA. On warfarin PTA for afib. INR is low at 1.6. Starting low goal heparin for now per neuro. Other labs above reviewed.  Goal of Therapy:  Heparin level 0.3-0.5 units/ml Monitor platelets by anticoagulation protocol: Yes   Plan:  Start heparin drip at 950 units/hr 1100 heparin level Daily  CBC/heparin level/INR Monitor for bleeding  Narda Bonds, PharmD, BCPS Clinical Pharmacist Phone: [phone]

## 2022-05-11 NOTE — Evaluation (Signed)
Occupational Therapy Evaluation Patient Details Name: Emily Abbott MRN: [id number] DOB: 1940-11-02 Today's Date: 05/11/2022   History of Present Illness Emily Abbott is a 82 y.o. female admitted with transient left arm numbness. She states that it lasted about 5 minutes, who presents with she feels now like she is completely back to her baseline.  In the emergency department, she received workup with CTA which reveals multiple areas of severe disease as well as which was subtherapeutic  stenosis in M1 right MCA.  PMH  of atrial fibrillation who takes Coumadin, also with diabetes, hypertension hyperlipidemia and bjilateral knee arthritis.   Clinical Impression   Pt currently min assist to min guard for simulated selfcare tasks sit to stand and functional transfers without use of an assistive device.  Pt lives alone in an apartment next to her daughter and son-in-law and was mostly modified independent in the apartment except for cleaning, which her daughter would assist some with.   Feel she will benefit from acute care OT at this time to progress back to modified independent level for transition back home.         Recommendations for follow up therapy are one component of a multi-disciplinary discharge planning process, led by the attending physician.  Recommendations may be updated based on patient status, additional functional criteria and insurance authorization.   Follow Up Recommendations  No OT follow up     Assistance Recommended at Discharge PRN  Patient can return home with the following Assistance with cooking/housework;Assist for transportation;Help with stairs or ramp for entrance    Functional Status Assessment  Patient has had a recent decline in their functional status and demonstrates the ability to make significant improvements in function in a reasonable and predictable amount of time.  Equipment Recommendations  None recommended by OT       Precautions /  Restrictions Precautions Precautions: Fall Precaution Comments: SBP <180 Restrictions Weight Bearing Restrictions: No      Mobility Bed Mobility Overal bed mobility: Needs Assistance Bed Mobility: Supine to Sit, Sit to Supine     Supine to sit: Supervision Sit to supine: Supervision        Transfers Overall transfer level: Needs assistance Equipment used: None Transfers: Sit to/from Stand, Bed to chair/wheelchair/BSC Sit to Stand: Min guard     Step pivot transfers: Min assist     General transfer comment: Pt able to ambulate in the hallway to the bathroom without assistive device and occasional min assist secondary to weaving.  Pt tries to hold onto the rail in the hallway.      Balance Overall balance assessment: Needs assistance Sitting-balance support: Feet unsupported Sitting balance-Leahy Scale: Fair Sitting balance - Comments: Pt able to maintain sitting balance EOB with supervision to donn gripper socks.   Standing balance support: During functional activity, Reliant on assistive device for balance Standing balance-Leahy Scale: Poor Standing balance comment: Pt needs UE support for mobility                           ADL either performed or assessed with clinical judgement   ADL Overall ADL's : Needs assistance/impaired Eating/Feeding: Independent;Sitting   Grooming: Wash/dry hands;Wash/dry face;Min guard;Standing   Upper Body Bathing: Set up;Sitting Upper Body Bathing Details (indicate cue type and reason): simulated Lower Body Bathing: Sit to/from stand;Min guard Lower Body Bathing Details (indicate cue type and reason): simulated Upper Body Dressing : Set up;Sitting Upper Body Dressing Details (  indicate cue type and reason): simulated Lower Body Dressing: Sit to/from stand;Min guard   Toilet Transfer: Minimal assistance;Ambulation;Regular Toilet;Grab bars Toilet Transfer Details (indicate cue type and reason): no assistive  device Toileting- Clothing Manipulation and Hygiene: Min guard;Sit to/from stand       Functional mobility during ADLs: Minimal assistance (No assistive device) General ADL Comments: Pt currently min assist to min guard for functional mobiity to the bathroom in the hallway as well as for simulated selfcare sit to stand.  Vitals stable throughout with BP at 154/88 with HR at 77 BPM and 97% oxygen level on room air.     Vision Baseline Vision/History: 1 Wears glasses Ability to See in Adequate Light: 0 Adequate Patient Visual Report: Blurring of vision (intermittently but not present this session) Vision Assessment?: Yes Eye Alignment: Within Functional Limits Ocular Range of Motion: Within Functional Limits Alignment/Gaze Preference: Within Defined Limits Tracking/Visual Pursuits: Able to track stimulus in all quads without difficulty Convergence: Within functional limits Visual Fields: No apparent deficits            Pertinent Vitals/Pain  No report of pain     Hand Dominance Right   Extremity/Trunk Assessment Upper Extremity Assessment Upper Extremity Assessment: Overall WFL for tasks assessed           Communication Communication Communication: No difficulties   Cognition Arousal/Alertness: Awake/alert Behavior During Therapy: WFL for tasks assessed/performed Overall Cognitive Status: Within Functional Limits for tasks assessed                                                  Home Living Family/patient expects to be discharged to:: Private residence Living Arrangements: Alone Available Help at Discharge: Available PRN/intermittently (lives next to daughter and son in law who work) Type of Home: Apartment Home Access: Stairs to enter Technical brewer of Steps: 2 Entrance Stairs-Rails: Right (pull bar on the apartmentt exterior surface to help) Home Layout: One level     Bathroom Shower/Tub: Walk-in shower (built in seat but has  separate seat as well)   Biochemist, clinical: Handicapped height     Home Equipment: Shower seat;Grab bars - tub/shower;Grab bars - toilet;Rollator (4 wheels);Rolling Walker (2 wheels);Cane - quad          Prior Functioning/Environment Prior Level of Function : Independent/Modified Independent             Mobility Comments: Uses rollator sometimes when her knees are painful ADLs Comments: modified independent for selfcare.  Family brings meals to her if she doesnt fix something.  Her daughter assist with cleaning at times.        OT Problem List: Decreased strength;Impaired balance (sitting and/or standing);Decreased knowledge of use of DME or AE      OT Treatment/Interventions: Self-care/ADL training;Patient/family education;Balance training;Neuromuscular education;Therapeutic activities;DME and/or AE instruction;Therapeutic exercise    OT Goals(Current goals can be found in the care plan section) Acute Rehab OT Goals Patient Stated Goal: Pt wants to go home as soon as she can and quit having these episodes happen to her. OT Goal Formulation: With patient Time For Goal Achievement: 05/25/22 Potential to Achieve Goals: Good  OT Frequency: Min 2X/week       AM-PAC OT "6 Clicks" Daily Activity     Outcome Measure Help from another person eating meals?: None Help from another person taking care of  personal grooming?: A Little Help from another person toileting, which includes using toliet, bedpan, or urinal?: A Little Help from another person bathing (including washing, rinsing, drying)?: A Little Help from another person to put on and taking off regular upper body clothing?: A Little Help from another person to put on and taking off regular lower body clothing?: A Little 6 Click Score: 19   End of Session Equipment Utilized During Treatment: Gait belt Nurse Communication: Mobility status  Activity Tolerance: Patient tolerated treatment well Patient left: in bed;with call  bell/phone within reach;Other (comment) (with PT and speech therapy for next session)  OT Visit Diagnosis: Unsteadiness on feet (R26.81);Other abnormalities of gait and mobility (R26.89);Muscle weakness (generalized) (M62.81)                Time: [id number] OT Time Calculation (min): 42 min Charges:  OT General Charges $OT Visit: 1 Visit OT Evaluation $OT Eval Moderate Complexity: 1 Mod OT Treatments $Self Care/Home Management : 23-37 mins  Clyda Greener, OTR/L [place]  Office [phone] 05/11/2022

## 2022-05-11 NOTE — Progress Notes (Signed)
  Echocardiogram 2D Echocardiogram has been performed.  Emily Abbott 05/11/2022, 3:24 PM

## 2022-05-11 NOTE — Evaluation (Signed)
Speech Language Pathology Evaluation Patient Details Name: Emily Abbott MRN: [id number] DOB: May 11, 1940 Today's Date: 05/11/2022 Time: [id number] SLP Time Calculation (min) (ACUTE ONLY): 25 min  Problem List:  Patient Active Problem List   Diagnosis Date Noted   Transient neurologic deficit 05/11/2022   Stage 3b chronic kidney disease (CKD) ([place]) 05/11/2022   Biventricular cardiac pacemaker in situ 01/26/2020   COPD exacerbation ([place]) 02/13/2018   Acute on chronic respiratory failure with hypoxia ([place]) 02/13/2018   Hyperparathyroidism ([place]) 06/17/2017   Obstructive sleep apnea 02/22/2015   Lung nodule, solitary 11/24/2014   Dyspnea and respiratory abnormality 11/10/2014   Hoarseness of voice 11/10/2014   Smoking history 11/10/2014   History of pulmonary embolism 11/10/2014   Biventricular implantable cardioverter-defibrillator in situ 07/10/2011   ORTHOSTATIC DIZZINESS 10/10/2009   DM2 (diabetes mellitus, type 2) ([place]) 11/12/2008   HYPERLIPIDEMIA, MIXED 11/12/2008   ANEMIA 11/12/2008   Anxiety state 11/12/2008   PANIC ATTACK 11/12/2008   Essential hypertension 11/12/2008   Coronary atherosclerosis 11/12/2008   MITRAL VALVE DISORDER 11/12/2008   PAROXYSMAL ATRIAL FIBRILLATION 11/12/2008   COPD (chronic obstructive pulmonary disease) ([place]) 11/12/2008   GERD 11/12/2008   OSTEOARTHRITIS 11/12/2008   Past Medical History:  Past Medical History:  Diagnosis Date   Anemia    Anxiety    CAD (coronary artery disease)    Chronic kidney disease    CKD stage 3    COPD (chronic obstructive pulmonary disease) (HCC)    uses  1 to and1.5 L of o2 at bedtime ; has not seen her pulm doctor in over a year ; has not had to uses inhlaer in a over a year     Diabetes mellitus    type II   GERD (gastroesophageal reflux disease)    HTN (hypertension)    essential   Hyperlipidemia, mixed    Mitral valve disorder    Osteoarthritis    Panic attack    Paroxysmal atrial fibrillation ([place])     Pneumonia    2010   Pulmonary embolism ([place]) 2010   Sleep apnea    does not uses cpap since she got a URI from it. has home 02 as needed   Past Surgical History:  Past Surgical History:  Procedure Laterality Date   BIOPSY BREAST     BIV ICD GENERATOR CHANGEOUT N/A 06/06/2017   Procedure: BIV ICD GENERATOR CHANGEOUT;  Surgeon: Evans Lance, MD;  Location: [facility];  Service: Cardiovascular;  Laterality: N/A;   CARDIAC VALVE SURGERY     mitral   carpal tennel  2003   EYE SURGERY  2015  or 2016   cataract surgery    PARATHYROIDECTOMY Left 06/17/2017   Procedure: LEFT UPPER PARATHYROIDECTOMY;  Surgeon: Clovis Riley, MD;  Location: [facility];  Service: General;  Laterality: Left;   TUBAL LIGATION      over 60 years ago    HPI:  Pt is an 82 y.o. female admitted with transient left arm numbness. She states that it lasted about 5 minutes, who presents with she feels now like she is completely back to her baseline. In the emergency department, she received workup with CTA which reveals multiple areas of severe disease as well as which was subtherapeutic stenosis in M1 right MCA. PMH of atrial fibrillation who takes Coumadin, also with diabetes, hypertension hyperlipidemia and bjilateral knee arthritis.   Assessment / Plan / Recommendation Clinical Impression  Pt seen for cognitive-linguistic evaluation. Evaluation completed via informal means  and SLUMS. Pt demonstrated intact functional cognitive-linguistic ability. Pt with mildly hoarse, but functional, vocal quality which pt endorses is baseline. No SLP f/u warranted at this time.    SLP Assessment  SLP Recommendation/Assessment: Patient does not need any further [facility] SLP Visit Diagnosis: Cognitive communication deficit (R41.841)    Recommendations for follow up therapy are one component of a multi-disciplinary discharge planning process, led by the attending physician.  Recommendations may be  updated based on patient status, additional functional criteria and insurance authorization.    Follow Up Recommendations  No SLP follow up    Assistance Recommended at Discharge  PRN  Functional Status Assessment Patient has not had a recent decline in their functional status        SLP Evaluation Cognition  Overall Cognitive Status: Within Functional Limits for tasks assessed Orientation Level: Oriented X4 Attention:  [facility]) Memory: Appears intact Awareness: Appears intact Problem Solving: Appears intact Executive Function:  (WFL) Safety/Judgment: Appears intact       Comprehension  Auditory Comprehension Overall Auditory Comprehension: Appears within functional limits for tasks assessed Yes/No Questions: Within Functional Limits Commands: Within Functional Limits Conversation: Complex Visual Recognition/Discrimination Discrimination: Not tested Reading Comprehension Reading Status: Not tested    Expression Expression Primary Mode of Expression: Verbal Verbal Expression Overall Verbal Expression: Appears within functional limits for tasks assessed Initiation: No impairment Automatic Speech:  (WFL) Level of Generative/Spontaneous Verbalization: Conversation [facility]) Repetition: No impairment Naming: No impairment Pragmatics: No impairment Written Expression Dominant Hand: Right   Oral / Motor  Oral Motor/Sensory Function Overall Oral Motor/Sensory Function: Within functional limits Motor Speech Overall Motor Speech: Appears within functional limits for tasks assessed Respiration: Within functional limits Phonation: Other (comment) (mildly hoarse; functional; baseline per pt report) Resonance: Within functional limits Articulation: Within functional limitis Intelligibility: Intelligible Motor Planning: Witnin functional limits Motor Speech Errors: Not applicable            Cherrie Gauze, M.S., [place] Speech-Language Pathologist Secure Chat Preferred  O:  [phone]  Quintella Baton 05/11/2022, 11:34 AM

## 2022-05-11 NOTE — Progress Notes (Signed)
ANTICOAGULATION CONSULT NOTE  Pharmacy Consult for Heparin  Indication: atrial fibrillation  Allergies  Allergen Reactions   Metformin And Related Other (See Comments)    "caused me kidney problems"    Adhesive [Tape] Other (See Comments)    Burning sensation   Atorvastatin Other (See Comments)    Makes legs hurt    Sulfonamide Derivatives Other (See Comments)    itch    Patient Measurements: Height: 5\' 6"  (167.6 cm) Weight: 84.3 kg (185 lb 13.6 oz) IBW/kg (Calculated) : 59.3  Vital Signs: Temp: 98.9 F (37.2 C) (03/22 2020) Temp Source: Oral (03/22 2020) BP: 156/80 (03/22 2020) Pulse Rate: 79 (03/22 2020)  Labs: Recent Labs    05/10/22 2237 05/11/22 1057 05/11/22 2053  HGB 13.8 13.5  --   HCT 42.7 42.7  --   PLT 217 237  --   LABPROT 19.1*  --   --   INR 1.6*  --   --   HEPARINUNFRC  --  0.25* 0.34  CREATININE 1.22* 1.36*  --      Estimated Creatinine Clearance: 34.3 mL/min (A) (by C-G formula based on SCr of 1.36 mg/dL (H)).   Medical History: Past Medical History:  Diagnosis Date   Anemia    Anxiety    CAD (coronary artery disease)    Chronic kidney disease    CKD stage 3    COPD (chronic obstructive pulmonary disease) (HCC)    uses  1 to and1.5 L of o2 at bedtime ; has not seen her pulm doctor in over a year ; has not had to uses inhlaer in a over a year     Diabetes mellitus    type II   GERD (gastroesophageal reflux disease)    HTN (hypertension)    essential   Hyperlipidemia, mixed    Mitral valve disorder    Osteoarthritis    Panic attack    Paroxysmal atrial fibrillation ([place])    Pneumonia    2010   Pulmonary embolism ([place]) 2010   Sleep apnea    does not uses cpap since she got a URI from it. has home 02 as needed     Assessment: 82 y/o F with TIA subtherapeutic on warfarin (INR 1.6 on admit). On warfarin for Afib. Pharmacy consulted to dose heparin - stroke protocol (no bolus, low goal)  Heparin level 0.25 units/mL  (subtherapeutic) on heparin 950 units/hr. CBC WNL with no signs of bleeding.  Heparin came back therapeutic tonight. We will continue the current rate and check in AM.   Goal of Therapy:  Heparin level 0.3-0.5 units/ml Monitor platelets by anticoagulation protocol: Yes   Plan:  Cont heparin 1050 units/hr Daily CBC and heparin level Follow up long-term AC plans: neurology potentially wanting to start Eliquis. Patient may need assistance with cost upon discharge.   Onnie Boer, PharmD, BCIDP, AAHIVP, CPP Infectious Disease Pharmacist 05/11/2022 9:44 PM

## 2022-05-11 NOTE — Plan of Care (Signed)
  Problem: Education: Goal: Ability to describe self-care measures that may prevent or decrease complications (Diabetes Survival Skills Education) will improve Outcome: Progressing   Problem: Coping: Goal: Ability to adjust to condition or change in health will improve Outcome: Progressing   Problem: Fluid Volume: Goal: Ability to maintain a balanced intake and output will improve Outcome: Progressing   Problem: Health Behavior/Discharge Planning: Goal: Ability to identify and utilize available resources and services will improve Outcome: Progressing Goal: Ability to manage health-related needs will improve Outcome: Progressing   Problem: Metabolic: Goal: Ability to maintain appropriate glucose levels will improve Outcome: Progressing   Problem: Nutritional: Goal: Maintenance of adequate nutrition will improve Outcome: Progressing Goal: Progress toward achieving an optimal weight will improve Outcome: Progressing   Problem: Skin Integrity: Goal: Risk for impaired skin integrity will decrease Outcome: Progressing   

## 2022-05-11 NOTE — ED Provider Notes (Signed)
Patient signed out to me by Dr. Maylon Peppers, CT angio pending.  Patient had an episode this evening where she had left facial droop, slurred speech, left arm weakness.  Symptoms lasted about 5 minutes.  She is now back to her baseline.  Physical Exam  BP (!) 157/84   Pulse 75   Temp 97.9 F (36.6 C) (Oral)   Resp 15   Ht 5\' 6"  (1.676 m)   Wt 83.5 kg   SpO2 98%   BMI 29.70 kg/m   Physical Exam Vitals and nursing note reviewed.  Constitutional:      Appearance: Normal appearance.  HENT:     Head: Normocephalic.  Eyes:     Pupils: Pupils are equal, round, and reactive to light.  Cardiovascular:     Rate and Rhythm: Normal rate.  Pulmonary:     Effort: Pulmonary effort is normal.  Musculoskeletal:        General: Normal range of motion.  Neurological:     General: No focal deficit present.     Mental Status: She is alert and oriented to person, place, and time.     Cranial Nerves: Cranial nerves 2-12 are intact.     Sensory: Sensation is intact.     Motor: Motor function is intact.     Procedures  Procedures  ED Course / MDM   Clinical Course as of 05/11/22 0159  Thu May 10, 2022  2342 Patient signed out to Dr. Betsey Holiday in stable condition pending CTA with plan for likely admission. [VK]    Clinical Course User Index [VK] Kemper Durie, DO   Medical Decision Making Amount and/or Complexity of Data Reviewed Labs: ordered. Radiology: ordered.  Risk Prescription drug management.   Patient with what appears to be TIA.  She had less than 10 minutes of left facial droop, slurred speech, left arm weakness.  Upon my evaluation, there are no deficits.  She does report that she is back to her normal baseline.  CT angiography with multiple areas of disease.  Patient INR is 1.6.  She is currently on Coumadin.  She is on Coumadin secondary to atrial fibrillation and a history of blood clots.  Patient tells me that she cannot tolerate statins but did get started on an  injectable cholesterol med but has not been dosing it correctly.  Discussed briefly with neurology.  Admission is recommended.  Transition to stroke protocol heparin for now.       Orpah Greek, MD 05/11/22 [phone]

## 2022-05-11 NOTE — ED Notes (Signed)
ED TO INPATIENT HANDOFF REPORT  ED Nurse Name and Phone #: Su Grand [phone]  S Name/Age/Gender Emily Abbott 82 y.o. female Room/Bed: 038C/038C  Code Status   Code Status: Full Code  Home/SNF/Other Home Patient oriented to: self, place, time, and situation Is this baseline? Yes   Triage Complete: Triage complete  Chief Complaint Transient neurologic deficit [R29.818]  Triage Note Pt BIB [facility] from home w c/o dizziness and slurred speech. Pt was standing at the sink talking to daughter when it happened. Pt has had dizzy spells for the past week and she feels in the front of her head. Pt had slurred speech for 10 mins.   EMS VS BP 180/110 P 80 O2 97% RA CBG 160   Allergies Allergies  Allergen Reactions   Metformin And Related Other (See Comments)    "caused me kidney problems"    Adhesive [Tape] Other (See Comments)    Burning sensation   Atorvastatin Other (See Comments)    Makes legs hurt    Sulfonamide Derivatives Other (See Comments)    itch    Level of Care/Admitting Diagnosis ED Disposition     ED Disposition  Admit   Condition  --   Comment  Hospital Area: [facility] [[id number]]  Level of Care: Telemetry Medical [104]  May place patient in observation at [facility] or [facility] if equivalent level of care is available:: No  Covid Evaluation: Asymptomatic - no recent exposure (last 10 days) testing not required  Diagnosis: Transient neurologic deficit [id number]  Admitting Physician: Vianne Bulls [id number]  Attending Physician: Vianne Bulls [id number]          B Medical/Surgery History Past Medical History:  Diagnosis Date   Anemia    Anxiety    CAD (coronary artery disease)    Chronic kidney disease    CKD stage 3    COPD (chronic obstructive pulmonary disease) ([place])    uses  1 to and1.5 L of o2 at bedtime ; has not seen her pulm doctor in over a year ; has not had to uses inhlaer in a over a year      Diabetes mellitus    type II   GERD (gastroesophageal reflux disease)    HTN (hypertension)    essential   Hyperlipidemia, mixed    Mitral valve disorder    Osteoarthritis    Panic attack    Paroxysmal atrial fibrillation ([place])    Pneumonia    2010   Pulmonary embolism ([place]) 2010   Sleep apnea    does not uses cpap since she got a URI from it. has home 02 as needed   Past Surgical History:  Procedure Laterality Date   BIOPSY BREAST     BIV ICD GENERATOR CHANGEOUT N/A 06/06/2017   Procedure: BIV ICD GENERATOR CHANGEOUT;  Surgeon: Evans Lance, MD;  Location: [facility];  Service: Cardiovascular;  Laterality: N/A;   CARDIAC VALVE SURGERY     mitral   carpal tennel  2003   EYE SURGERY  2015  or 2016   cataract surgery    PARATHYROIDECTOMY Left 06/17/2017   Procedure: LEFT UPPER PARATHYROIDECTOMY;  Surgeon: Clovis Riley, MD;  Location: [facility];  Service: General;  Laterality: Left;   TUBAL LIGATION      over 60 years ago      A IV Location/Drains/Wounds Patient Lines/Drains/Airways Status     Active Line/Drains/Airways     Name Placement  date Placement time Site Days   Peripheral IV 05/10/22 18 G Left Antecubital 05/10/22  2230  Antecubital  1   Peripheral IV 05/11/22 20 G Anterior;Right Forearm 05/11/22  0334  Forearm  less than 1   Incision (Closed) 06/06/17 Chest Left 06/06/17  1133  -- 1800   Incision (Closed) 06/17/17 Neck 06/17/17  0832  -- 1789            Intake/Output Last 24 hours No intake or output data in the 24 hours ending 05/11/22 1245  Labs/Imaging Results for orders placed or performed during the hospital encounter of 05/10/22 (from the past 48 hour(s))  CBC     Status: Abnormal   Collection Time: 05/10/22 10:37 PM  Result Value Ref Range   WBC 10.6 (H) 4.0 - 10.5 K/uL   RBC 5.38 (H) 3.87 - 5.11 MIL/uL   Hemoglobin 13.8 12.0 - 15.0 g/dL   HCT 42.7 36.0 - 46.0 %   MCV 79.4 (L) 80.0 - 100.0 fL   MCH 25.7 (L) 26.0 - 34.0 pg   MCHC  32.3 30.0 - 36.0 g/dL   RDW 16.0 (H) 11.5 - 15.5 %   Platelets 217 150 - 400 K/uL   nRBC 0.0 0.0 - 0.2 %    Comment: Performed at [facility], [address], [place], [place] [address]  Comprehensive metabolic panel     Status: Abnormal   Collection Time: 05/10/22 10:37 PM  Result Value Ref Range   Sodium 136 135 - 145 mmol/L   Potassium 3.4 (L) 3.5 - 5.1 mmol/L   Chloride 103 98 - 111 mmol/L   CO2 25 22 - 32 mmol/L   Glucose, Bld 150 (H) 70 - 99 mg/dL    Comment: Glucose reference range applies only to samples taken after fasting for at least 8 hours.   BUN 23 8 - 23 mg/dL   Creatinine, Ser 1.22 (H) 0.44 - 1.00 mg/dL   Calcium 9.7 8.9 - 10.3 mg/dL   Total Protein 7.3 6.5 - 8.1 g/dL   Albumin 4.0 3.5 - 5.0 g/dL   AST 18 15 - 41 U/L   ALT 14 0 - 44 U/L   Alkaline Phosphatase 75 38 - 126 U/L   Total Bilirubin 0.6 0.3 - 1.2 mg/dL   GFR, Estimated 44 (L) >60 mL/min    Comment: (NOTE) Calculated using the CKD-EPI Creatinine Equation (2021)    Anion gap 8 5 - 15    Comment: Performed at [facility] [address], [place], [place] [address]  Protime-INR     Status: Abnormal   Collection Time: 05/10/22 10:37 PM  Result Value Ref Range   Prothrombin Time 19.1 (H) 11.4 - 15.2 seconds   INR 1.6 (H) 0.8 - 1.2    Comment: (NOTE) INR goal varies based on device and disease states. Performed at [facility], [place] [address], [place], [place] [address]   Lipid panel     Status: Abnormal   Collection Time: 05/11/22  3:31 AM  Result Value Ref Range   Cholesterol 266 (H) 0 - 200 mg/dL   Triglycerides 214 (H) <150 mg/dL   HDL 46 >40 mg/dL   Total CHOL/HDL Ratio 5.8 RATIO   VLDL 43 (H) 0 - 40 mg/dL   LDL Cholesterol 177 (H) 0 - 99 mg/dL    Comment:        Total Cholesterol/HDL:CHD Risk Coronary Heart Disease Risk Table  Men   Women  1/2 Average Risk   3.4   3.3  Average Risk       5.0   4.4  2 X Average Risk   9.6   7.1  3 X Average Risk   23.4   11.0        Use the calculated Patient Ratio above and the CHD Risk Table to determine the patient's CHD Risk.        ATP III CLASSIFICATION (LDL):  <100     mg/dL   Optimal  100-129  mg/dL   Near or Above                    Optimal  130-159  mg/dL   Borderline  160-189  mg/dL   High  >190     mg/dL   Very High Performed at [facility] [address], [place], [place] [address]   CBG monitoring, ED     Status: Abnormal   Collection Time: 05/11/22  8:14 AM  Result Value Ref Range   Glucose-Capillary 162 (H) 70 - 99 mg/dL    Comment: Glucose reference range applies only to samples taken after fasting for at least 8 hours.   Comment 1 Document in Chart   Heparin level (unfractionated)     Status: Abnormal   Collection Time: 05/11/22 10:57 AM  Result Value Ref Range   Heparin Unfractionated 0.25 (L) 0.30 - 0.70 IU/mL    Comment: (NOTE) The clinical reportable range upper limit is being lowered to >1.10 to align with the FDA approved guidance for the current laboratory assay.  If heparin results are below expected values, and patient dosage has  been confirmed, suggest follow up testing of antithrombin III levels. Performed at [facility], [place] [address], [place], [place] [address]   CBC with Differential/Platelet     Status: Abnormal   Collection Time: 05/11/22 10:57 AM  Result Value Ref Range   WBC 10.3 4.0 - 10.5 K/uL   RBC 5.32 (H) 3.87 - 5.11 MIL/uL   Hemoglobin 13.5 12.0 - 15.0 g/dL   HCT 42.7 36.0 - 46.0 %   MCV 80.3 80.0 - 100.0 fL   MCH 25.4 (L) 26.0 - 34.0 pg   MCHC 31.6 30.0 - 36.0 g/dL   RDW 16.3 (H) 11.5 - 15.5 %   Platelets 237 150 - 400 K/uL   nRBC 0.0 0.0 - 0.2 %   Neutrophils Relative % 67 %   Neutro Abs 6.9 1.7 - 7.7 K/uL   Lymphocytes Relative 22 %   Lymphs Abs 2.3 0.7 - 4.0 K/uL   Monocytes Relative 8 %   Monocytes Absolute 0.8 0.1 - 1.0 K/uL   Eosinophils Relative 2 %   Eosinophils Absolute 0.2 0.0 - 0.5 K/uL   Basophils  Relative 1 %   Basophils Absolute 0.1 0.0 - 0.1 K/uL   Immature Granulocytes 0 %   Abs Immature Granulocytes 0.02 0.00 - 0.07 K/uL    Comment: Performed at [facility] [address], [place], [place] [address]  Comprehensive metabolic panel     Status: Abnormal   Collection Time: 05/11/22 10:57 AM  Result Value Ref Range   Sodium 137 135 - 145 mmol/L   Potassium 3.4 (L) 3.5 - 5.1 mmol/L   Chloride 102 98 - 111 mmol/L   CO2 26 22 - 32 mmol/L   Glucose, Bld 165 (H) 70 - 99 mg/dL  Comment: Glucose reference range applies only to samples taken after fasting for at least 8 hours.   BUN 23 8 - 23 mg/dL   Creatinine, Ser 1.36 (H) 0.44 - 1.00 mg/dL   Calcium 9.5 8.9 - 10.3 mg/dL   Total Protein 6.8 6.5 - 8.1 g/dL   Albumin 3.8 3.5 - 5.0 g/dL   AST 20 15 - 41 U/L   ALT 13 0 - 44 U/L   Alkaline Phosphatase 71 38 - 126 U/L   Total Bilirubin 0.7 0.3 - 1.2 mg/dL   GFR, Estimated 39 (L) >60 mL/min    Comment: (NOTE) Calculated using the CKD-EPI Creatinine Equation (2021)    Anion gap 9 5 - 15    Comment: Performed at [facility] [address] [place], [place] [address]  Magnesium     Status: None   Collection Time: 05/11/22 10:57 AM  Result Value Ref Range   Magnesium 2.4 1.7 - 2.4 mg/dL    Comment: Performed at [facility] [address], [place], [place] [address]  Phosphorus     Status: None   Collection Time: 05/11/22 10:57 AM  Result Value Ref Range   Phosphorus 3.9 2.5 - 4.6 mg/dL    Comment: Performed at [facility] [address], [place], [place] [address]  CBG monitoring, ED     Status: Abnormal   Collection Time: 05/11/22 12:06 PM  Result Value Ref Range   Glucose-Capillary 202 (H) 70 - 99 mg/dL    Comment: Glucose reference range applies only to samples taken after fasting for at least 8 hours.   Comment 1 Document in Chart    CT CHEST WO CONTRAST  Result Date: 05/11/2022 CLINICAL DATA:  Pulmonary nodule EXAM: CT CHEST WITHOUT  CONTRAST TECHNIQUE: Multidetector CT imaging of the chest was performed following the standard protocol without IV contrast. RADIATION DOSE REDUCTION: This exam was performed according to the departmental dose-optimization program which includes automated exposure control, adjustment of the mA and/or kV according to patient size and/or use of iterative reconstruction technique. COMPARISON:  09/18/2019 FINDINGS: Cardiovascular: Moderate multi-vessel coronary artery calcification. Mitral valve replacement has been performed. Global cardiac size within normal limits. No pericardial effusion. Left subclavian 3 lead pacemaker is in place with leads within the right atrium, right ventricle, and left ventricular venous outflow. Central pulmonary arteries are mildly enlarged in keeping with changes of pulmonary arterial hypertension. Moderate atherosclerotic calcification is seen within the thoracic aorta, particularly at the origin of the left subclavian artery. No aortic aneurysm. Mediastinum/Nodes: Visualized thyroid is unremarkable. No pathologic adenopathy. Esophagus unremarkable. Small hiatal hernia. Lungs/Pleura: Right upper lobe pulmonary nodule described on previously performed CTA examination as again identified demonstrating a mean diameter of 11 mm demonstrating uniform soft tissue attenuation. This is indeterminate. New 7 mm somewhat inflammatory appearing pulmonary nodule is seen within the right lower lobe, axial image # 97/4, indeterminate. Additional solid noncalcified pulmonary nodules within the anterior right upper lobe, right middle lobe, lower lobes bilaterally are unchanged and previously characterized as benign. Additionally, 10 mm ground-glass pulmonary nodule within the left upper lobe, axial image # 49/4 is stable since remote prior examination of 11/08/2016 and is safely considered benign. Upper Abdomen: No acute abnormality. Musculoskeletal: No acute bone abnormality. No lytic or blastic bone  lesion. Osseous structures are age-appropriate. IMPRESSION: 1. New 11 mm right upper lobe pulmonary nodule, indeterminate. Consider one of the following in 3 months for both low-risk and high-risk individuals: (  a) repeat chest CT, (b) follow-up PET-CT, or (c) tissue sampling. This recommendation follows the consensus statement: Guidelines for Management of Incidental Pulmonary Nodules Detected on CT Images: From the Fleischner Society 2017; Radiology 2017; 284:228-243. New 7 mm somewhat inflammatory appearing pulmonary nodule within the right lower lobe can be reassessed simultaneously. 2. Moderate multi-vessel coronary artery calcification. 3. Morphologic changes in keeping with pulmonary arterial hypertension. 4. Small hiatal hernia. Aortic Atherosclerosis (ICD10-I70.0). Electronically Signed   By: Fidela Salisbury M.D.   On: 05/11/2022 03:33   CT Angio Head Neck W WO CM  Result Date: 05/11/2022 CLINICAL DATA:  Initial evaluation for neuro deficit, stroke suspected. EXAM: CT ANGIOGRAPHY HEAD AND NECK TECHNIQUE: Multidetector CT imaging of the head and neck was performed using the standard protocol during bolus administration of intravenous contrast. Multiplanar CT image reconstructions and MIPs were obtained to evaluate the vascular anatomy. Carotid stenosis measurements (when applicable) are obtained utilizing NASCET criteria, using the distal internal carotid diameter as the denominator. RADIATION DOSE REDUCTION: This exam was performed according to the departmental dose-optimization program which includes automated exposure control, adjustment of the mA and/or kV according to patient size and/or use of iterative reconstruction technique. CONTRAST:  5mL OMNIPAQUE IOHEXOL 350 MG/ML SOLN COMPARISON:  Prior study from 06/04/2016. FINDINGS: CT HEAD FINDINGS Brain: Cerebral volume within normal limits for age. Patchy and confluent hypodensity involving the supratentorial cerebral white matter, most characteristic  of moderate chronic microvascular ischemic disease. No acute intracranial hemorrhage. No acute large vessel territory infarct. No mass lesion or midline shift. No hydrocephalus or extra-axial fluid collection. Vascular: No hyperdense vessel. Calcified atherosclerosis present at the skull base Skull: Scalp soft tissues and calvarium demonstrate no acute finding. Sinuses/Orbits: Globes normal soft tissues within normal limits. Visualized paranasal sinuses are clear. No mastoid effusion. Other: None. Review of the MIP images confirms the above findings CTA NECK FINDINGS Aortic arch: Visualized aortic arch normal caliber with standard 3 vessel morphology. Moderate atheromatous change about the arch and origin of the great vessels. Right carotid system: The right common and internal carotid arteries are patent without dissection. Moderate atheromatous change about the right CCA and right carotid bulb without hemodynamically significant greater than 50% stenosis. Left carotid system: Left common and internal carotid arteries are patent without dissection. Mild atheromatous change about the left CCA and left carotid bulb without hemodynamically significant greater than 50% stenosis. Vertebral arteries: Both vertebral arteries arise from subclavian arteries. Atheromatous change about the origin of the left subclavian artery with up to 60% stenosis. Vertebral arteries M cells are patent without significant stenosis or dissection. Skeleton: No discrete or worrisome osseous lesions. Moderate cervical spondylosis, most pronounced at C5-6 and C6-7. Patient is edentulous. Other neck: No other acute soft tissue abnormality within the neck. Upper chest: Median sternotomy wires with left-sided pacemaker/AICD noted. 1.7 cm nodular density present within the right upper lobe (series 10, image 155), indeterminate. This is new as compared to prior chest CT from 09/18/2019. Review of the MIP images confirms the above findings CTA HEAD  FINDINGS Anterior circulation: Atheromatous change seen within the carotid siphons with associated mild multifocal narrowing. A1 segments patent bilaterally. Left A1 hypoplastic. Normal anterior communicating complex. Anterior cerebral arteries patent without significant stenosis. Left M1 segment widely patent. Focal severe stenosis involving the distal right M1 segment/right MCA bifurcation (series 15, image 21). Distal MCA branches perfused bilaterally. Posterior circulation: Both V4 segments are patent without stenosis. Right vertebral artery slightly dominant. Both PICA patent. Basilar patent without  significant stenosis. Superior cerebral arteries patent bilaterally. Both PCAs primarily supplied via the basilar. Right PCA patent to its distal aspect without high-grade stenosis. Focal severe stenosis noted at the proximal left P1/P2 junction (series 15, image 21). There is an additional severe near occlusive stenosis involving the left P2 segment distally (series 15, image 21). The left PCA is markedly attenuated and irregular but appears to be grossly patent distally, best appreciated on coronal MPR sequence (series 13). Venous sinuses: Patent allowing for timing the contrast bolus. Anatomic variants: As above.  No aneurysm. Review of the MIP images confirms the above findings IMPRESSION: CT HEAD IMPRESSION: 1. No acute intracranial abnormality. 2. Moderate chronic microvascular ischemic disease. CTA HEAD AND NECK IMPRESSION: 1. Negative CTA for large vessel occlusion or other emergent finding. 2. 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 The left PCA is markedly attenuated and irregular but remains patent distally. 3. Atheromatous change about the origin of the left subclavian artery with up to 60% stenosis. No other hemodynamically significant stenosis within the neck. 4. 1.7 cm  right upper lobe nodule, indeterminate, but new as compared to prior chest CT from 09/18/2019. Per Fleischner Society Guidelines, recommend prompt non-contrast Chest CT for further evaluation. Aortic Atherosclerosis (ICD10-I70.0). Electronically Signed   By: Jeannine Boga M.D.   On: 05/11/2022 01:38   DG Chest 2 View  Result Date: 05/10/2022 CLINICAL DATA:  Dizziness EXAM: CHEST - 2 VIEW COMPARISON:  07/17/2019 FINDINGS: Left pacer remains in place, unchanged. Prior CABG. Heart and mediastinal contours are within normal limits. No focal opacities or effusions. No acute bony abnormality. IMPRESSION: No active cardiopulmonary disease. Electronically Signed   By: Rolm Baptise M.D.   On: 05/10/2022 22:30    Pending Labs Unresulted Labs (From admission, onward)     Start     Ordered   05/12/22 0500  Protime-INR  Daily,   R      05/11/22 0414   05/12/22 0500  Heparin level (unfractionated)  Daily,   R      05/11/22 1205   05/12/22 0500  CBC  Daily,   R      05/11/22 1205   05/11/22 2100  Heparin level (unfractionated)  Once-Timed,   TIMED        05/11/22 1205   05/11/22 0500  Hemoglobin A1c  (Labs)  Tomorrow morning,   R       Comments: To assess prior glycemic control    05/11/22 0241            Vitals/Pain Today's Vitals   05/11/22 0900 05/11/22 0946 05/11/22 1030 05/11/22 1100  BP: (!) 154/134  (!) 154/88 (!) 150/126  Pulse: 71  89 71  Resp: 14  20 20   Temp:  97.7 F (36.5 C)    TempSrc:  Oral    SpO2: 96%  94% 98%  Weight:      Height:      PainSc:        Isolation Precautions No active isolations  Medications Medications  heparin ADULT infusion 100 units/mL (25000 units/223mL) (1,050 Units/hr Intravenous Rate/Dose Change 05/11/22 1244)  ALPRAZolam (XANAX) tablet 0.5 mg (has no administration in time range)  PARoxetine (PAXIL) tablet 30 mg (30 mg Oral Given 05/11/22 0928)  pantoprazole (PROTONIX) EC tablet 40 mg (40 mg Oral Given 05/11/22 0928)  albuterol  (PROVENTIL) (2.5 MG/3ML) 0.083% nebulizer solution 3 mL (  has no administration in time range)  fluticasone furoate-vilanterol (BREO ELLIPTA) 200-25 MCG/ACT 1 puff (1 puff Inhalation Given 05/11/22 0926)    And  umeclidinium bromide (INCRUSE ELLIPTA) 62.5 MCG/ACT 1 puff (1 puff Inhalation Given 05/11/22 0925)   stroke: early stages of recovery book (has no administration in time range)  acetaminophen (TYLENOL) tablet 650 mg (has no administration in time range)    Or  acetaminophen (TYLENOL) 160 MG/5ML solution 650 mg (has no administration in time range)    Or  acetaminophen (TYLENOL) suppository 650 mg (has no administration in time range)  senna-docusate (Senokot-S) tablet 1 tablet (has no administration in time range)  sodium chloride flush (NS) 0.9 % injection 3 mL (3 mLs Intravenous Given 05/11/22 0927)  sodium chloride flush (NS) 0.9 % injection 3 mL (has no administration in time range)  0.9 %  sodium chloride infusion (has no administration in time range)  insulin aspart (novoLOG) injection 0-6 Units (2 Units Subcutaneous Given 05/11/22 1240)  insulin glargine-yfgn (SEMGLEE) injection 8 Units (8 Units Subcutaneous Given 05/11/22 0929)  insulin aspart (novoLOG) injection 0-5 Units (has no administration in time range)  HYDROcodone-acetaminophen (NORCO/VICODIN) 5-325 MG per tablet 1 tablet (has no administration in time range)  iohexol (OMNIPAQUE) 350 MG/ML injection 75 mL (75 mLs Intravenous Contrast Given 05/11/22 0038)    Mobility walks with device     Focused Assessments Neuro Assessment Handoff:  Swallow screen pass? Yes    NIH Stroke Scale  Dizziness Present: No Headache Present: No Interval: Shift assessment Level of Consciousness (1a.)   : Alert, keenly responsive LOC Questions (1b. )   : Answers both questions correctly LOC Commands (1c. )   : Performs both tasks correctly Best Gaze (2. )  : Normal Visual (3. )  : No visual loss Facial Palsy (4. )    : Normal  symmetrical movements Motor Arm, Left (5a. )   : No drift Motor Arm, Right (5b. ) : No drift Motor Leg, Left (6a. )  : No drift Motor Leg, Right (6b. ) : No drift Limb Ataxia (7. ): Absent Sensory (8. )  : Normal, no sensory loss Best Language (9. )  : No aphasia Dysarthria (10. ): Normal Extinction/Inattention (11.)   : No Abnormality Complete NIHSS TOTAL: 0 Last date known well: 05/10/22 Last time known well: 1900 Neuro Assessment: Within Defined Limits Neuro Checks:   Initial (05/10/22 2124)  Has TPA been given? No If patient is a Neuro Trauma and patient is going to OR before floor call report to [place] nurse: [phone] or [phone]   R Recommendations: See Admitting Provider Note  Report given to:   Additional Notes:

## 2022-05-11 NOTE — TOC Initial Note (Signed)
Transition of Care [facility]) - Initial/Assessment Note    Patient Details  Name: Emily Abbott MRN: [id number] Date of Birth: 08/20/1940  [facility]) CM/SW Contact:    Levonne Lapping, RN Phone Number: 05/11/2022, 4:07 PM  Clinical Narrative:                 CM has arranged recommended Home health for Patient  [place] accepting agency [facility]) and will provide home physical therapy. Entered on AVS  Patient will dc to home with Family . PCP established     Expected Discharge Plan: [place] Barriers to Discharge: Continued Medical Work up   Patient Goals and CMS Choice Patient states their goals for this hospitalization and ongoing recovery are:: Go home CMS Medicare.gov Compare Post Acute Care list provided to:: Patient Choice offered to / list presented to : Patient      Expected Discharge Plan and Services   Discharge Planning Services: CM Consult Post Acute Care Choice: [place] arrangements for the past 2 months: Single Family Home                           HH Arranged: PT [place]: [place] Date [place]: 05/11/22 Time [place]: [place] Representative spoke with at [place]: Claiborne Billings  Prior Living Arrangements/Services Living arrangements for the past 2 months: [place] with:: Self Patient language and need for interpreter reviewed:: Yes Do you feel safe going back to the place where you live?: Yes      Need for Family Participation in Patient Care: No (Comment) Care giver support system in place?: No (comment) Current home services: Home PT Criminal Activity/Legal Involvement Pertinent to Current Situation/Hospitalization: No - Comment as needed  Activities of Daily Living      Permission Sought/Granted Permission sought to share information with : Case Manager       Permission granted to share info w AGENCY: Home Health        Emotional  Assessment Appearance:: Appears stated age Attitude/Demeanor/Rapport: Unable to Assess Affect (typically observed): Unable to Assess     Psych Involvement: No (comment)  Admission diagnosis:  Transient neurologic deficit [R29.818] Patient Active Problem List   Diagnosis Date Noted   Transient neurologic deficit 05/11/2022   Stage 3b chronic kidney disease (CKD) ([place]) 05/11/2022   Biventricular cardiac pacemaker in situ 01/26/2020   COPD exacerbation ([place]) 02/13/2018   Acute on chronic respiratory failure with hypoxia ([place]) 02/13/2018   Hyperparathyroidism ([place]) 06/17/2017   Obstructive sleep apnea 02/22/2015   Lung nodule, solitary 11/24/2014   Dyspnea and respiratory abnormality 11/10/2014   Hoarseness of voice 11/10/2014   Smoking history 11/10/2014   History of pulmonary embolism 11/10/2014   Biventricular implantable cardioverter-defibrillator in situ 07/10/2011   ORTHOSTATIC DIZZINESS 10/10/2009   DM2 (diabetes mellitus, type 2) ([place]) 11/12/2008   HYPERLIPIDEMIA, MIXED 11/12/2008   ANEMIA 11/12/2008   Anxiety state 11/12/2008   PANIC ATTACK 11/12/2008   Essential hypertension 11/12/2008   Coronary atherosclerosis 11/12/2008   MITRAL VALVE DISORDER 11/12/2008   PAROXYSMAL ATRIAL FIBRILLATION 11/12/2008   COPD (chronic obstructive pulmonary disease) ([place]) 11/12/2008   GERD 11/12/2008   OSTEOARTHRITIS 11/12/2008   PCP:  Harlan Stains, MD Pharmacy:   [id number] - [place], [place] - [address] [place] [address] Phone: [phone] Fax:  [place], [place] - [address] [place] [address] Phone: [phone] Fax: [phone]     Social Determinants of Health (SDOH) Social History: SDOH Screenings   Tobacco Use: Medium Risk (05/11/2022)   SDOH Interventions:     Readmission Risk Interventions      No data to display

## 2022-05-11 NOTE — Consult Note (Signed)
Neurology Consultation Reason for Consult: TIA Referring Physician: See  CC: Transient left arm weakness  History is obtained from: Patient  HPI: Emily Abbott is a 82 y.o. female with a history of atrial fibrillation who takes Coumadin, also with diabetes, hypertension hyperlipidemia who presents with transient left arm numbness.  She states that it lasted about 5 minutes, she feels now like she is completely back to her baseline.  In the emergency department, she received workup with CTA which reveals multiple areas of severe disease as well as which was subtherapeutic.  She was started on heparin and is being admitted with neurology asked to provide further recommendations.   LKW: 3/21, evening tnk given?: no, mild symptoms  Past Medical History:  Diagnosis Date   Anemia    Anxiety    CAD (coronary artery disease)    Chronic kidney disease    CKD stage 3    COPD (chronic obstructive pulmonary disease) (HCC)    uses  1 to and1.5 L of o2 at bedtime ; has not seen her pulm doctor in over a year ; has not had to uses inhlaer in a over a year     Diabetes mellitus    type II   GERD (gastroesophageal reflux disease)    HTN (hypertension)    essential   Hyperlipidemia, mixed    Mitral valve disorder    Osteoarthritis    Panic attack    Paroxysmal atrial fibrillation ([place])    Pneumonia    2010   Pulmonary embolism ([place]) 2010   Sleep apnea    does not uses cpap since she got a URI from it. has home 02 as needed     Family History  Problem Relation Age of Onset   Stroke Mother    Anuerysm Father    Diabetes Sister    Cancer Brother    Ulcers Sister      Social History:  reports that she quit smoking about 21 years ago. Her smoking use included cigarettes. She has a 50.00 pack-year smoking history. She has never used smokeless tobacco. She reports that she does not drink alcohol and does not use drugs.   Exam: Current vital signs: BP (!) 148/79   Pulse 74   Temp  97.9 F (36.6 C)   Resp 17   Ht 5\' 6"  (1.676 m)   Wt 83.5 kg   SpO2 93%   BMI 29.70 kg/m  Vital signs in last 24 hours: Temp:  [97.9 F (36.6 C)-98.3 F (36.8 C)] 97.9 F (36.6 C) (03/22 0459) Pulse Rate:  [73-78] 74 (03/22 0500) Resp:  [10-20] 17 (03/22 0500) BP: (138-180)/(73-96) 148/79 (03/22 0500) SpO2:  [93 %-100 %] 93 % (03/22 0500) Weight:  [83.5 kg] 83.5 kg (03/21 2118)   Physical Exam  Appears well-developed and well-nourished.   Neuro: Mental Status: Patient is awake, alert, oriented to person, place, month, year, and situation. Patient is able to give a clear and coherent history. No signs of aphasia or neglect Cranial Nerves: II: Visual Fields are full. Pupils are equal, round, and reactive to light.   III,IV, VI: EOMI without ptosis or diploplia.  V: Facial sensation is symmetric to temperature VII: Facial movement with an asymmetric smile, with lower on the left.  Of the right, but nasolabial folds are approximately equal VIII: hearing is intact to voice X: Uvula elevates symmetrically XI: Shoulder shrug is symmetric. XII: tongue is midline without atrophy or fasciculations.  Motor: Tone is normal.  Bulk is normal. 5/5 strength was present in all four extremities.  Sensory: Sensation is symmetric to light touch and temperature in the arms and legs. Cerebellar: Mild tremor on finger-nose-finger bilaterally     I have reviewed labs in epic and the results pertinent to this consultation are: LDL 177 Creatinine 1.2  I have reviewed the images obtained: CT/CTA-multifocal disease including severe right M1  Impression: 82 year old female who presents with transient ischemic attack in the setting of subtherapeutic INR and multifocal intracranial atherosclerosis.  It is not clear to me whether this was an embolic event or related to the large vessel atherosclerosis, but in any case with resolution of her symptoms I agree with heparinizing.  Over the long-term  I would favor changing her to one of the newer oral anticoagulants as opposed to Coumadin.  Recommendations: 1) continue heparin pending MRI 2) once decision is made to restart oral anticoagulants, would consider Eliquis 5 mg twice daily 3) recommend more aggressive lipid therapy 4) A1c, telemetry, echo 5) stroke team to follow   Roland Rack, MD [facility] [phone]  If 7pm- 7am, please page neurology on call as listed in [place].

## 2022-05-11 NOTE — ED Notes (Signed)
Pt advised not to eat or drink. Pt said she was going too anyways

## 2022-05-11 NOTE — H&P (Signed)
History and Physical    Emily Abbott [id number] DOB: December 05, 1940 DOA: 05/10/2022  PCP: Harlan Stains, MD   Patient coming from: Home   Chief Complaint: Slurred speech, left facial droop, left arm numbness and weakness   HPI: Emily Abbott is a pleasant 82 y.o. female with medical history significant for atrial fibrillation on Coumadin, CKD stage III, COPD, OSA, type 2 diabetes mellitus, anxiety, and hypertension who now presents to the emergency department for evaluation of slurred speech, left facial droop, and left arm weakness.  Patient reports that she has been experiencing headache and dizziness intermittently for little over a week now.  She had otherwise been in her usual state until yesterday evening when she was noted to have slurred speech and left facial droop.  Patient felt that her left arm was weak and numb.  EMS was called, blood pressure was found to be 180/110, and she was brought into the ED.  Her symptoms all resolved after approximately 5 minutes.  ED Course: Upon arrival to the ED, patient is found to be afebrile and saturating well on room air with normal heart rate and elevated blood pressure.  EKG demonstrates atrial flutter with LBBB.  Chest x-ray negative for acute cardiopulmonary disease.  Head CT negative for acute intracranial abnormality.  CTA head and neck negative for LVO or other emergent finding.  Right upper lobe lung nodule was noted incidentally on CT.  Labs are most notable for creatinine 1.22 and INR 1.6.  Neurology was consulted by the ED physician and the patient was started on IV heparin per stroke protocol.  Review of Systems:  All other systems reviewed and apart from HPI, are negative.  Past Medical History:  Diagnosis Date   Anemia    Anxiety    CAD (coronary artery disease)    Chronic kidney disease    CKD stage 3    COPD (chronic obstructive pulmonary disease) (HCC)    uses  1 to and1.5 L of o2 at bedtime ; has not seen her pulm  doctor in over a year ; has not had to uses inhlaer in a over a year     Diabetes mellitus    type II   GERD (gastroesophageal reflux disease)    HTN (hypertension)    essential   Hyperlipidemia, mixed    Mitral valve disorder    Osteoarthritis    Panic attack    Paroxysmal atrial fibrillation ([place])    Pneumonia    2010   Pulmonary embolism ([place]) 2010   Sleep apnea    does not uses cpap since she got a URI from it. has home 02 as needed    Past Surgical History:  Procedure Laterality Date   BIOPSY BREAST     BIV ICD GENERATOR CHANGEOUT N/A 06/06/2017   Procedure: BIV ICD GENERATOR CHANGEOUT;  Surgeon: Evans Lance, MD;  Location: [facility];  Service: Cardiovascular;  Laterality: N/A;   CARDIAC VALVE SURGERY     mitral   carpal tennel  2003   EYE SURGERY  2015  or 2016   cataract surgery    PARATHYROIDECTOMY Left 06/17/2017   Procedure: LEFT UPPER PARATHYROIDECTOMY;  Surgeon: Clovis Riley, MD;  Location: [facility];  Service: General;  Laterality: Left;   TUBAL LIGATION      over 60 years ago     Social History:   reports that she quit smoking about 21 years ago. Her smoking use included cigarettes. She  has a 50.00 pack-year smoking history. She has never used smokeless tobacco. She reports that she does not drink alcohol and does not use drugs.  Allergies  Allergen Reactions   Metformin And Related Other (See Comments)    "caused me kidney problems"    Adhesive [Tape] Other (See Comments)    Burning sensation   Atorvastatin Other (See Comments)    Makes legs hurt    Sulfonamide Derivatives Other (See Comments)    itch    Family History  Problem Relation Age of Onset   Stroke Mother    Anuerysm Father    Diabetes Sister    Cancer Brother    Ulcers Sister      Prior to Admission medications   Medication Sig Start Date End Date Taking? Authorizing Provider  acetaminophen (TYLENOL) 325 MG tablet Take 2 tablets (650 mg total) by mouth every 6 (six)  hours as needed for mild pain or moderate pain. 06/18/17   Clovis Riley, MD  albuterol [facility] HFA) [phone] Base) MCG/ACT inhaler Inhale 1-2 puffs into the lungs every 6 (six) hours as needed for wheezing or shortness of breath. 10/10/21   Olalere, Adewale A, MD  ALPRAZolam (XANAX) 0.5 MG tablet Take 0.5 mg by mouth 2 (two) times daily as needed for anxiety or sleep.     [provider]  Azelastine HCl 137 MCG/SPRAY SOLN Place 2 sprays into both nostrils at bedtime.    [provider]  calcium carbonate (TUMS EX) 750 MG chewable tablet Chew 2 tablets by mouth as needed for heartburn.    [provider]  carvedilol (COREG) 6.25 MG tablet Take 6.25 mg by mouth 2 (two) times daily with a meal.    [provider]  cholecalciferol (VITAMIN D3) 25 MCG (1000 UNIT) tablet Take 1,000 Units by mouth daily.    [provider]  diphenhydrAMINE (BENADRYL) 25 MG tablet Take 12.5 mg by mouth daily as needed for itching.    [provider]  ezetimibe (ZETIA) 10 MG tablet Take 10 mg by mouth daily.    [provider]  Fluticasone-Umeclidin-Vilant (TRELEGY ELLIPTA) 100-62.5-25 MCG/ACT AEPB Inhale 1 puff into the lungs daily. 10/10/21   Laurin Coder, MD  Fluticasone-Umeclidin-Vilant (TRELEGY ELLIPTA) 200-62.5-25 MCG/ACT AEPB Inhale 1 puff into the lungs daily. 06/21/21   Laurin Coder, MD  furosemide (LASIX) 40 MG tablet Take one tablet in the AM and one tablet after lunch (early afternoon). 08/29/21   Evans Lance, MD  HYDROcodone-acetaminophen (NORCO/VICODIN) 5-325 MG tablet Take 0.5-1 tablets by mouth at bedtime.    [provider]  Insulin Glargine (BASAGLAR KWIKPEN) 100 UNIT/ML Inject 14 Units into the skin daily. 07/15/19   [provider]  ipratropium-albuterol (DUONEB) 0.5-2.5 (3) MG/3ML SOLN Take 3 mLs by nebulization every 6 (six) hours as needed. 02/16/18   Caren Griffins, MD  linagliptin (TRADJENTA) 5 MG TABS  tablet Take 5 mg by mouth daily.    [provider]  magnesium gluconate (MAGONATE) 500 MG tablet Take 500 mg by mouth daily as needed (for leg cramps).     [provider]  omeprazole (PRILOSEC) 40 MG capsule Take 40 mg by mouth daily.      [provider]  OXYGEN Inhale 1.5 L into the lungs at bedtime.     [provider]  PARoxetine (PAXIL) 30 MG tablet Take 30 mg by mouth daily.      [provider]  tiZANidine (ZANAFLEX) 2  MG tablet Take 2 mg by mouth at bedtime as needed for muscle spasms.     [provider]  warfarin (COUMADIN) 5 MG tablet Take 2.5-5 mg by mouth See admin instructions. Take 5 mg by mouth daily on Sunday and Thursday. Take 2.5 mg by mouth daily on all other days    [provider]    Physical Exam: Vitals:   05/10/22 2119 05/11/22 0105 05/11/22 0115 05/11/22 0300  BP: (!) 180/96 (!) 157/84  (!) 140/73  Pulse: 74 75  78  Resp: 20 15  19   Temp: 98.3 F (36.8 C)  97.9 F (36.6 C)   TempSrc:   Oral   SpO2: 100% 98%  98%  Weight:      Height:        Constitutional: NAD, calm  Eyes: PERTLA, lids and conjunctivae normal ENMT: Mucous membranes are moist. Posterior pharynx clear of any exudate or lesions.   Neck: supple, no masses  Respiratory:  no wheezing, no crackles. No accessory muscle use.  Cardiovascular: S1 & S2 heard, regular rate and rhythm. No JVD. Abdomen: No distension, no tenderness, soft. Bowel sounds active.  Musculoskeletal: no clubbing / cyanosis. No joint deformity upper and lower extremities.   Skin: no significant rashes, lesions, ulcers. Warm, dry, well-perfused. Neurologic: CN 2-12 grossly intact. Sensation intact. Strength 5/5 in all 4 limbs. Alert and oriented.  Psychiatric: Pleasant. Cooperative.    Labs and Imaging on Admission: I have personally reviewed following labs and imaging studies  CBC: Recent Labs  Lab 05/10/22 2237  WBC 10.6*  HGB 13.8  HCT 42.7  MCV 79.4*   PLT [id number]   Basic Metabolic Panel: Recent Labs  Lab 05/10/22 2237  NA 136  K 3.4*  CL 103  CO2 25  GLUCOSE 150*  BUN 23  CREATININE 1.22*  CALCIUM 9.7   GFR: Estimated Creatinine Clearance: 38.1 mL/min (A) (by C-G formula based on SCr of 1.22 mg/dL (H)). Liver Function Tests: Recent Labs  Lab 05/10/22 2237  AST 18  ALT 14  ALKPHOS 75  BILITOT 0.6  PROT 7.3  ALBUMIN 4.0   No results for input(s): "LIPASE", "AMYLASE" in the last 168 hours. No results for input(s): "AMMONIA" in the last 168 hours. Coagulation Profile: Recent Labs  Lab 05/10/22 2237  INR 1.6*   Cardiac Enzymes: No results for input(s): "CKTOTAL", "CKMB", "CKMBINDEX", "TROPONINI" in the last 168 hours. BNP (last 3 results) No results for input(s): "PROBNP" in the last 8760 hours. HbA1C: No results for input(s): "HGBA1C" in the last 72 hours. CBG: No results for input(s): "GLUCAP" in the last 168 hours. Lipid Profile: No results for input(s): "CHOL", "HDL", "LDLCALC", "TRIG", "CHOLHDL", "LDLDIRECT" in the last 72 hours. Thyroid Function Tests: No results for input(s): "TSH", "T4TOTAL", "FREET4", "T3FREE", "THYROIDAB" in the last 72 hours. Anemia Panel: No results for input(s): "VITAMINB12", "FOLATE", "FERRITIN", "TIBC", "IRON", "RETICCTPCT" in the last 72 hours. Urine analysis:    Component Value Date/Time   COLORURINE YELLOW 02/13/2018 2014   APPEARANCEUR CLEAR 02/13/2018 2014   LABSPEC 1.013 02/13/2018 2014   PHURINE 5.0 02/13/2018 2014   GLUCOSEU NEGATIVE 02/13/2018 2014   HGBUR SMALL (A) 02/13/2018 2014   [place] NEGATIVE 02/13/2018 2014   [place] NEGATIVE 02/13/2018 2014   PROTEINUR NEGATIVE 02/13/2018 2014   UROBILINOGEN 0.2 12/19/2007 0144   NITRITE NEGATIVE 02/13/2018 2014   LEUKOCYTESUR NEGATIVE 02/13/2018 2014   Sepsis Labs: @LABRCNTIP (procalcitonin:4,lacticidven:4) )No results found for this or any previous visit (from the past  240 hour(s)).   Radiological Exams on  Admission: CT CHEST WO CONTRAST  Result Date: 05/11/2022 CLINICAL DATA:  Pulmonary nodule EXAM: CT CHEST WITHOUT CONTRAST TECHNIQUE: Multidetector CT imaging of the chest was performed following the standard protocol without IV contrast. RADIATION DOSE REDUCTION: This exam was performed according to the departmental dose-optimization program which includes automated exposure control, adjustment of the mA and/or kV according to patient size and/or use of iterative reconstruction technique. COMPARISON:  09/18/2019 FINDINGS: Cardiovascular: Moderate multi-vessel coronary artery calcification. Mitral valve replacement has been performed. Global cardiac size within normal limits. No pericardial effusion. Left subclavian 3 lead pacemaker is in place with leads within the right atrium, right ventricle, and left ventricular venous outflow. Central pulmonary arteries are mildly enlarged in keeping with changes of pulmonary arterial hypertension. Moderate atherosclerotic calcification is seen within the thoracic aorta, particularly at the origin of the left subclavian artery. No aortic aneurysm. Mediastinum/Nodes: Visualized thyroid is unremarkable. No pathologic adenopathy. Esophagus unremarkable. Small hiatal hernia. Lungs/Pleura: Right upper lobe pulmonary nodule described on previously performed CTA examination as again identified demonstrating a mean diameter of 11 mm demonstrating uniform soft tissue attenuation. This is indeterminate. New 7 mm somewhat inflammatory appearing pulmonary nodule is seen within the right lower lobe, axial image # 97/4, indeterminate. Additional solid noncalcified pulmonary nodules within the anterior right upper lobe, right middle lobe, lower lobes bilaterally are unchanged and previously characterized as benign. Additionally, 10 mm ground-glass pulmonary nodule within the left upper lobe, axial image # 49/4 is stable since remote prior examination of 11/08/2016 and is safely considered  benign. Upper Abdomen: No acute abnormality. Musculoskeletal: No acute bone abnormality. No lytic or blastic bone lesion. Osseous structures are age-appropriate. IMPRESSION: 1. New 11 mm right upper lobe pulmonary nodule, indeterminate. Consider one of the following in 3 months for both low-risk and high-risk individuals: (a) repeat chest CT, (b) follow-up PET-CT, or (c) tissue sampling. This recommendation follows the consensus statement: Guidelines for Management of Incidental Pulmonary Nodules Detected on CT Images: From the Fleischner Society 2017; Radiology 2017; 284:228-243. New 7 mm somewhat inflammatory appearing pulmonary nodule within the right lower lobe can be reassessed simultaneously. 2. Moderate multi-vessel coronary artery calcification. 3. Morphologic changes in keeping with pulmonary arterial hypertension. 4. Small hiatal hernia. Aortic Atherosclerosis (ICD10-I70.0). Electronically Signed   By: Fidela Salisbury M.D.   On: 05/11/2022 03:33   CT Angio Head Neck W WO CM  Result Date: 05/11/2022 CLINICAL DATA:  Initial evaluation for neuro deficit, stroke suspected. EXAM: CT ANGIOGRAPHY HEAD AND NECK TECHNIQUE: Multidetector CT imaging of the head and neck was performed using the standard protocol during bolus administration of intravenous contrast. Multiplanar CT image reconstructions and MIPs were obtained to evaluate the vascular anatomy. Carotid stenosis measurements (when applicable) are obtained utilizing NASCET criteria, using the distal internal carotid diameter as the denominator. RADIATION DOSE REDUCTION: This exam was performed according to the departmental dose-optimization program which includes automated exposure control, adjustment of the mA and/or kV according to patient size and/or use of iterative reconstruction technique. CONTRAST:  44mL OMNIPAQUE IOHEXOL 350 MG/ML SOLN COMPARISON:  Prior study from 06/04/2016. FINDINGS: CT HEAD FINDINGS Brain: Cerebral volume within normal limits  for age. Patchy and confluent hypodensity involving the supratentorial cerebral white matter, most characteristic of moderate chronic microvascular ischemic disease. No acute intracranial hemorrhage. No acute large vessel territory infarct. No mass lesion or midline shift. No hydrocephalus or extra-axial fluid collection. Vascular: No hyperdense vessel. Calcified atherosclerosis present at the  skull base Skull: Scalp soft tissues and calvarium demonstrate no acute finding. Sinuses/Orbits: Globes normal soft tissues within normal limits. Visualized paranasal sinuses are clear. No mastoid effusion. Other: None. Review of the MIP images confirms the above findings CTA NECK FINDINGS Aortic arch: Visualized aortic arch normal caliber with standard 3 vessel morphology. Moderate atheromatous change about the arch and origin of the great vessels. Right carotid system: The right common and internal carotid arteries are patent without dissection. Moderate atheromatous change about the right CCA and right carotid bulb without hemodynamically significant greater than 50% stenosis. Left carotid system: Left common and internal carotid arteries are patent without dissection. Mild atheromatous change about the left CCA and left carotid bulb without hemodynamically significant greater than 50% stenosis. Vertebral arteries: Both vertebral arteries arise from subclavian arteries. Atheromatous change about the origin of the left subclavian artery with up to 60% stenosis. Vertebral arteries M cells are patent without significant stenosis or dissection. Skeleton: No discrete or worrisome osseous lesions. Moderate cervical spondylosis, most pronounced at C5-6 and C6-7. Patient is edentulous. Other neck: No other acute soft tissue abnormality within the neck. Upper chest: Median sternotomy wires with left-sided pacemaker/AICD noted. 1.7 cm nodular density present within the right upper lobe (series 10, image 155), indeterminate. This is  new as compared to prior chest CT from 09/18/2019. Review of the MIP images confirms the above findings CTA HEAD FINDINGS Anterior circulation: Atheromatous change seen within the carotid siphons with associated mild multifocal narrowing. A1 segments patent bilaterally. Left A1 hypoplastic. Normal anterior communicating complex. Anterior cerebral arteries patent without significant stenosis. Left M1 segment widely patent. Focal severe stenosis involving the distal right M1 segment/right MCA bifurcation (series 15, image 21). Distal MCA branches perfused bilaterally. Posterior circulation: Both V4 segments are patent without stenosis. Right vertebral artery slightly dominant. Both PICA patent. Basilar patent without significant stenosis. Superior cerebral arteries patent bilaterally. Both PCAs primarily supplied via the basilar. Right PCA patent to its distal aspect without high-grade stenosis. Focal severe stenosis noted at the proximal left P1/P2 junction (series 15, image 21). There is an additional severe near occlusive stenosis involving the left P2 segment distally (series 15, image 21). The left PCA is markedly attenuated and irregular but appears to be grossly patent distally, best appreciated on coronal MPR sequence (series 13). Venous sinuses: Patent allowing for timing the contrast bolus. Anatomic variants: As above.  No aneurysm. Review of the MIP images confirms the above findings IMPRESSION: CT HEAD IMPRESSION: 1. No acute intracranial abnormality. 2. Moderate chronic microvascular ischemic disease. CTA HEAD AND NECK IMPRESSION: 1. Negative CTA for large vessel occlusion or other emergent finding. 2. 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 The left PCA is markedly attenuated and irregular but remains patent distally. 3. Atheromatous change about the origin of the left  subclavian artery with up to 60% stenosis. No other hemodynamically significant stenosis within the neck. 4. 1.7 cm right upper lobe nodule, indeterminate, but new as compared to prior chest CT from 09/18/2019. Per Fleischner Society Guidelines, recommend prompt non-contrast Chest CT for further evaluation. Aortic Atherosclerosis (ICD10-I70.0). Electronically Signed   By: Jeannine Boga M.D.   On: 05/11/2022 01:38   DG Chest 2 View  Result Date: 05/10/2022 CLINICAL DATA:  Dizziness EXAM: CHEST - 2 VIEW COMPARISON:  07/17/2019 FINDINGS: Left pacer remains in place, unchanged. Prior CABG. Heart and mediastinal contours are  within normal limits. No focal opacities or effusions. No acute bony abnormality. IMPRESSION: No active cardiopulmonary disease. Electronically Signed   By: Rolm Baptise M.D.   On: 05/10/2022 22:30    EKG: Independently reviewed. Atrial flutter, LBBB.   Assessment/Plan   1. Transient speech difficulty and left-sided weakness  - Back to baseline in ED; no acute findings on head CT; no LVO or other emergent CTA finding  - Continue cardiac monitoring and frequent neuro checks, check MRI brain if PPM is compatible, check echo, A1c, and lipids, consult PT/OT/SLP    2. Paroxysmal atrial fibrillation; subtherapeutic INR  - INR 1.6 in ED  - Neurology recommends IV heparin with stroke protocol    3. COPD; OSA  - Not in exacerbation on admission  - Continue ICS-LAMA-LABA and as-needed albuterol, continue CPAP qHS   4. Anxiety  - Continue Paxil    5. CKD IIIb  - Appears close to baseline  - Renally-dose medications    6. Lung nodule  - Noted incidentally on CT in ED  - Discussed with patient and her daughters, outpatient follow-up with her pulmonologist recommended     DVT prophylaxis: IV heparin  Code Status: Full  Level of Care: Level of care: Telemetry Medical Family Communication: Daughters at bedside  Disposition Plan:  Patient is from: Home  Anticipated d/c  is to: Home Anticipated d/c date is: 05/12/22  Patient currently: Pending CVA workup  Consults called: Neurology  Admission status: Observation     Vianne Bulls, MD [facility]  05/11/2022, 3:48 AM

## 2022-05-12 ENCOUNTER — Observation Stay (HOSPITAL_COMMUNITY): Payer: Medicare HMO

## 2022-05-12 DIAGNOSIS — R29818 Other symptoms and signs involving the nervous system: Secondary | ICD-10-CM | POA: Diagnosis not present

## 2022-05-12 DIAGNOSIS — R911 Solitary pulmonary nodule: Secondary | ICD-10-CM | POA: Diagnosis not present

## 2022-05-12 DIAGNOSIS — N1832 Chronic kidney disease, stage 3b: Secondary | ICD-10-CM | POA: Diagnosis not present

## 2022-05-12 DIAGNOSIS — F411 Generalized anxiety disorder: Secondary | ICD-10-CM | POA: Diagnosis not present

## 2022-05-12 DIAGNOSIS — I480 Paroxysmal atrial fibrillation: Secondary | ICD-10-CM | POA: Diagnosis not present

## 2022-05-12 DIAGNOSIS — G4733 Obstructive sleep apnea (adult) (pediatric): Secondary | ICD-10-CM | POA: Diagnosis not present

## 2022-05-12 DIAGNOSIS — J439 Emphysema, unspecified: Secondary | ICD-10-CM | POA: Diagnosis not present

## 2022-05-12 DIAGNOSIS — R69 Illness, unspecified: Secondary | ICD-10-CM | POA: Diagnosis not present

## 2022-05-12 LAB — COMPREHENSIVE METABOLIC PANEL
ALT: 13 U/L (ref 0–44)
AST: 14 U/L — ABNORMAL LOW (ref 15–41)
Albumin: 3.7 g/dL (ref 3.5–5.0)
Alkaline Phosphatase: 68 U/L (ref 38–126)
Anion gap: 13 (ref 5–15)
BUN: 23 mg/dL (ref 8–23)
CO2: 20 mmol/L — ABNORMAL LOW (ref 22–32)
Calcium: 9.8 mg/dL (ref 8.9–10.3)
Chloride: 104 mmol/L (ref 98–111)
Creatinine, Ser: 1.27 mg/dL — ABNORMAL HIGH (ref 0.44–1.00)
GFR, Estimated: 42 mL/min — ABNORMAL LOW (ref >60)
Glucose, Bld: 161 mg/dL — ABNORMAL HIGH (ref 70–99)
Potassium: 3.9 mmol/L (ref 3.5–5.1)
Sodium: 137 mmol/L (ref 135–145)
Total Bilirubin: 0.7 mg/dL (ref 0.3–1.2)
Total Protein: 6.7 g/dL (ref 6.5–8.1)

## 2022-05-12 LAB — GLUCOSE, CAPILLARY
Glucose-Capillary: 167 mg/dL — ABNORMAL HIGH (ref 70–99)
Glucose-Capillary: 203 mg/dL — ABNORMAL HIGH (ref 70–99)
Glucose-Capillary: 209 mg/dL — ABNORMAL HIGH (ref 70–99)

## 2022-05-12 LAB — CBC WITH DIFFERENTIAL/PLATELET
Abs Immature Granulocytes: 0.05 10*3/uL (ref 0.00–0.07)
Basophils Absolute: 0.1 10*3/uL (ref 0.0–0.1)
Basophils Relative: 1 %
Eosinophils Absolute: 0.2 10*3/uL (ref 0.0–0.5)
Eosinophils Relative: 2 %
HCT: 41.4 % (ref 36.0–46.0)
Hemoglobin: 13.3 g/dL (ref 12.0–15.0)
Immature Granulocytes: 1 %
Lymphocytes Relative: 20 %
Lymphs Abs: 2.1 10*3/uL (ref 0.7–4.0)
MCH: 25.5 pg — ABNORMAL LOW (ref 26.0–34.0)
MCHC: 32.1 g/dL (ref 30.0–36.0)
MCV: 79.3 fL — ABNORMAL LOW (ref 80.0–100.0)
Monocytes Absolute: 0.7 10*3/uL (ref 0.1–1.0)
Monocytes Relative: 6 %
Neutro Abs: 7.3 10*3/uL (ref 1.7–7.7)
Neutrophils Relative %: 70 %
Platelets: 211 10*3/uL (ref 150–400)
RBC: 5.22 MIL/uL — ABNORMAL HIGH (ref 3.87–5.11)
RDW: 16.3 % — ABNORMAL HIGH (ref 11.5–15.5)
WBC: 10.3 10*3/uL (ref 4.0–10.5)
nRBC: 0 % (ref 0.0–0.2)

## 2022-05-12 LAB — MAGNESIUM: Magnesium: 2.3 mg/dL (ref 1.7–2.4)

## 2022-05-12 LAB — PROTIME-INR
INR: 1.5 — ABNORMAL HIGH (ref 0.8–1.2)
Prothrombin Time: 18.2 seconds — ABNORMAL HIGH (ref 11.4–15.2)

## 2022-05-12 LAB — HEPARIN LEVEL (UNFRACTIONATED): Heparin Unfractionated: 0.36 IU/mL (ref 0.30–0.70)

## 2022-05-12 LAB — PHOSPHORUS: Phosphorus: 4.5 mg/dL (ref 2.5–4.6)

## 2022-05-12 LAB — HEMOGLOBIN A1C
Hgb A1c MFr Bld: 8 % — ABNORMAL HIGH (ref 4.8–5.6)
Mean Plasma Glucose: 183 mg/dL

## 2022-05-12 MED ORDER — APIXABAN 5 MG PO TABS
5.0000 mg | ORAL_TABLET | Freq: Two times a day (BID) | ORAL | Status: DC
  Administered 2022-05-12: 5 mg via ORAL
  Filled 2022-05-12: qty 1

## 2022-05-12 MED ORDER — APIXABAN 5 MG PO TABS: 5.0000 mg | ORAL_TABLET | Freq: Two times a day (BID) | ORAL | 0 refills | Status: DC

## 2022-05-12 MED ORDER — EZETIMIBE 10 MG PO TABS
10.0000 mg | ORAL_TABLET | Freq: Every day | ORAL | Status: DC
  Administered 2022-05-12: 10 mg via ORAL
  Filled 2022-05-12: qty 1

## 2022-05-12 NOTE — Progress Notes (Addendum)
Addendum: Patient transitioned from heparin to eliquis.  ANTICOAGULATION CONSULT NOTE  Pharmacy Consult for Heparin  Indication: atrial fibrillation  Allergies  Allergen Reactions   Metformin And Related Other (See Comments)    "caused me kidney problems"    Adhesive [Tape] Other (See Comments)    Burning sensation   Atorvastatin Other (See Comments)    Makes legs hurt    Sulfonamide Derivatives Other (See Comments)    itch    Patient Measurements: Height: 5\' 6"  (167.6 cm) Weight: 84.3 kg (185 lb 13.6 oz) IBW/kg (Calculated) : 59.3  Vital Signs: Temp: 98.5 F (36.9 C) (03/22 2332) Temp Source: Oral (03/22 2332) BP: 167/82 (03/22 2332) Pulse Rate: 81 (03/22 2332)  Labs: Recent Labs    05/10/22 2237 05/11/22 1057 05/11/22 2053 05/12/22 0252  HGB 13.8 13.5  --  13.3  HCT 42.7 42.7  --  41.4  PLT 217 237  --  211  LABPROT 19.1*  --   --  18.2*  INR 1.6*  --   --  1.5*  HEPARINUNFRC  --  0.25* 0.34 0.36  CREATININE 1.22* 1.36*  --  1.27*     Estimated Creatinine Clearance: 36.7 mL/min (A) (by C-G formula based on SCr of 1.27 mg/dL (H)).   Medical History: Past Medical History:  Diagnosis Date   Anemia    Anxiety    CAD (coronary artery disease)    Chronic kidney disease    CKD stage 3    COPD (chronic obstructive pulmonary disease) (HCC)    uses  1 to and1.5 L of o2 at bedtime ; has not seen her pulm doctor in over a year ; has not had to uses inhlaer in a over a year     Diabetes mellitus    type II   GERD (gastroesophageal reflux disease)    HTN (hypertension)    essential   Hyperlipidemia, mixed    Mitral valve disorder    Osteoarthritis    Panic attack    Paroxysmal atrial fibrillation ([place])    Pneumonia    2010   Pulmonary embolism ([place]) 2010   Sleep apnea    does not uses cpap since she got a URI from it. has home 02 as needed     Assessment: 82 y/o F with TIA subtherapeutic on warfarin (INR 1.6 on admit). On warfarin for Afib. Pharmacy  consulted to dose heparin - stroke protocol (no bolus, low goal)  Confirmatory heparin level therapeutic this morning at 0.36 - will continue current rate. H&H stable. No concerns from RN or issues with the line.   Goal of Therapy:  Heparin level 0.3-0.5 units/ml Monitor platelets by anticoagulation protocol: Yes   Plan:  Cont heparin 1050 units/hr Daily CBC and heparin level Follow up long-term AC plans: neurology potentially wanting to start Eliquis. Patient may need assistance with cost upon discharge.   Vicenta Dunning, PharmD  PGY1 Pharmacy Resident

## 2022-05-12 NOTE — Plan of Care (Signed)
Problem: Education: Goal: Ability to describe self-care measures that may prevent or decrease complications (Diabetes Survival Skills Education) will improve 05/12/2022 1827 by Raj Janus, Thedore Mins, RN Outcome: Completed/Met 05/12/2022 1749 by Arther Abbott, RN Outcome: Progressing Goal: Individualized Educational Video(s) 05/12/2022 1827 by Raj Janus, Thedore Mins, RN Outcome: Completed/Met 05/12/2022 1749 by Arther Abbott, RN Outcome: Progressing   Problem: Coping: Goal: Ability to adjust to condition or change in health will improve 05/12/2022 1827 by Raj Janus, Thedore Mins, RN Outcome: Completed/Met 05/12/2022 1749 by Arther Abbott, RN Outcome: Progressing   Problem: Fluid Volume: Goal: Ability to maintain a balanced intake and output will improve 05/12/2022 1827 by Raj Janus, Thedore Mins, RN Outcome: Completed/Met 05/12/2022 1749 by Arther Abbott, RN Outcome: Progressing   Problem: Health Behavior/Discharge Planning: Goal: Ability to identify and utilize available resources and services will improve 05/12/2022 1827 by Raj Janus, Thedore Mins, RN Outcome: Completed/Met 05/12/2022 1749 by Raj Janus, Thedore Mins, RN Outcome: Progressing Goal: Ability to manage health-related needs will improve 05/12/2022 1827 by Raj Janus, Thedore Mins, RN Outcome: Completed/Met 05/12/2022 1749 by Arther Abbott, RN Outcome: Progressing   Problem: Metabolic: Goal: Ability to maintain appropriate glucose levels will improve 05/12/2022 1827 by Raj Janus, Thedore Mins, RN Outcome: Completed/Met 05/12/2022 1749 by Raj Janus, Thedore Mins, RN Outcome: Progressing   Problem: Nutritional: Goal: Maintenance of adequate nutrition will improve 05/12/2022 1827 by Raj Janus, Thedore Mins, RN Outcome: Completed/Met 05/12/2022 1749 by Raj Janus, Thedore Mins, RN Outcome: Progressing Goal: Progress toward achieving an optimal weight will improve 05/12/2022 1827 by Raj Janus, Thedore Mins, RN Outcome:  Completed/Met 05/12/2022 1749 by Raj Janus, Thedore Mins, RN Outcome: Progressing   Problem: Skin Integrity: Goal: Risk for impaired skin integrity will decrease 05/12/2022 1827 by Raj Janus, Thedore Mins, RN Outcome: Completed/Met 05/12/2022 1749 by Raj Janus, Thedore Mins, RN Outcome: Progressing   Problem: Tissue Perfusion: Goal: Adequacy of tissue perfusion will improve 05/12/2022 1827 by Raj Janus, Thedore Mins, RN Outcome: Completed/Met 05/12/2022 1749 by Arther Abbott, RN Outcome: Progressing   Problem: Education: Goal: Knowledge of disease or condition will improve 05/12/2022 1827 by Raj Janus, Thedore Mins, RN Outcome: Completed/Met 05/12/2022 1749 by Arther Abbott, RN Outcome: Progressing Goal: Knowledge of secondary prevention will improve (MUST DOCUMENT ALL) 05/12/2022 1827 by Raj Janus, Thedore Mins, RN Outcome: Completed/Met 05/12/2022 1749 by Raj Janus, Thedore Mins, RN Outcome: Progressing Goal: Knowledge of patient specific risk factors will improve Elta [place] N/A or DELETE if not current risk factor) 05/12/2022 1827 by Raj Janus, Thedore Mins, RN Outcome: Completed/Met 05/12/2022 1749 by Arther Abbott, RN Outcome: Progressing   Problem: Ischemic Stroke/TIA Tissue Perfusion: Goal: Complications of ischemic stroke/TIA will be minimized 05/12/2022 1827 by Raj Janus, Thedore Mins, RN Outcome: Completed/Met 05/12/2022 1749 by Arther Abbott, RN Outcome: Progressing   Problem: Coping: Goal: Will verbalize positive feelings about self 05/12/2022 1827 by Raj Janus, Thedore Mins, RN Outcome: Completed/Met 05/12/2022 1749 by Arther Abbott, RN Outcome: Progressing Goal: Will identify appropriate support needs 05/12/2022 1827 by Arther Abbott, RN Outcome: Completed/Met 05/12/2022 1749 by Arther Abbott, RN Outcome: Progressing   Problem: Health Behavior/Discharge Planning: Goal: Ability to manage health-related needs will improve 05/12/2022 1827 by Raj Janus, Thedore Mins,  RN Outcome: Completed/Met 05/12/2022 1749 by Arther Abbott, RN Outcome: Progressing Goal: Goals will be collaboratively established with patient/family 05/12/2022 1827 by Raj Janus, Thedore Mins, RN Outcome: Completed/Met 05/12/2022 1749 by Arther Abbott, RN Outcome: Progressing   Problem: Self-Care: Goal: Ability to participate in self-care as condition permits will improve 05/12/2022 1827 by Arther Abbott, RN  Outcome: Completed/Met 05/12/2022 1749 by Raj Janus, Thedore Mins, RN Outcome: Progressing Goal: Verbalization of feelings and concerns over difficulty with self-care will improve 05/12/2022 1827 by Raj Janus, Thedore Mins, RN Outcome: Completed/Met 05/12/2022 1749 by Raj Janus, Thedore Mins, RN Outcome: Progressing Goal: Ability to communicate needs accurately will improve 05/12/2022 1827 by Raj Janus, Thedore Mins, RN Outcome: Completed/Met 05/12/2022 1749 by Raj Janus, Thedore Mins, RN Outcome: Progressing   Problem: Nutrition: Goal: Risk of aspiration will decrease 05/12/2022 1827 by Raj Janus, Thedore Mins, RN Outcome: Completed/Met 05/12/2022 1749 by Raj Janus, Thedore Mins, RN Outcome: Progressing Goal: Dietary intake will improve 05/12/2022 1827 by Raj Janus, Thedore Mins, RN Outcome: Completed/Met 05/12/2022 1749 by Arther Abbott, RN Outcome: Progressing   Problem: Education: Goal: Knowledge of General Education information will improve Description: Including pain rating scale, medication(s)/side effects and non-pharmacologic comfort measures 05/12/2022 1827 by Raj Janus, Thedore Mins, RN Outcome: Completed/Met 05/12/2022 1749 by Arther Abbott, RN Outcome: Progressing   Problem: Health Behavior/Discharge Planning: Goal: Ability to manage health-related needs will improve 05/12/2022 1827 by Raj Janus, Thedore Mins, RN Outcome: Completed/Met 05/12/2022 1749 by Arther Abbott, RN Outcome: Progressing   Problem: Clinical Measurements: Goal: Ability to maintain clinical  measurements within normal limits will improve 05/12/2022 1827 by Raj Janus, Thedore Mins, RN Outcome: Completed/Met 05/12/2022 1749 by Raj Janus, Thedore Mins, RN Outcome: Progressing Goal: Will remain free from infection 05/12/2022 1827 by Arther Abbott, RN Outcome: Completed/Met 05/12/2022 1749 by Arther Abbott, RN Outcome: Progressing Goal: Diagnostic test results will improve 05/12/2022 1827 by Arther Abbott, RN Outcome: Completed/Met 05/12/2022 1749 by Raj Janus, Thedore Mins, RN Outcome: Progressing Goal: Respiratory complications will improve 05/12/2022 1827 by Arther Abbott, RN Outcome: Completed/Met 05/12/2022 1749 by Raj Janus, Thedore Mins, RN Outcome: Progressing Goal: Cardiovascular complication will be avoided 05/12/2022 1827 by Arther Abbott, RN Outcome: Completed/Met 05/12/2022 1749 by Arther Abbott, RN Outcome: Progressing   Problem: Activity: Goal: Risk for activity intolerance will decrease 05/12/2022 1827 by Raj Janus, Thedore Mins, RN Outcome: Completed/Met 05/12/2022 1749 by Raj Janus, Thedore Mins, RN Outcome: Progressing   Problem: Nutrition: Goal: Adequate nutrition will be maintained 05/12/2022 1827 by Raj Janus, Thedore Mins, RN Outcome: Completed/Met 05/12/2022 1749 by Raj Janus, Thedore Mins, RN Outcome: Progressing   Problem: Coping: Goal: Level of anxiety will decrease 05/12/2022 1827 by Raj Janus, Thedore Mins, RN Outcome: Completed/Met 05/12/2022 1749 by Arther Abbott, RN Outcome: Progressing   Problem: Elimination: Goal: Will not experience complications related to bowel motility 05/12/2022 1827 by Raj Janus, Thedore Mins, RN Outcome: Completed/Met 05/12/2022 1749 by Raj Janus, Thedore Mins, RN Outcome: Progressing Goal: Will not experience complications related to urinary retention 05/12/2022 1827 by Arther Abbott, RN Outcome: Completed/Met 05/12/2022 1749 by Arther Abbott, RN Outcome: Progressing   Problem: Education: Goal:  Knowledge of disease or condition will improve 05/12/2022 1827 by Arther Abbott, RN Outcome: Completed/Met 05/12/2022 1749 by Arther Abbott, RN Outcome: Progressing Goal: Knowledge of secondary prevention will improve (MUST DOCUMENT ALL) 05/12/2022 1827 by Raj Janus, Thedore Mins, RN Outcome: Completed/Met 05/12/2022 1749 by Arther Abbott, RN Outcome: Progressing Goal: Knowledge of patient specific risk factors will improve Elta [place] N/A or DELETE if not current risk factor) 05/12/2022 1827 by Arther Abbott, RN Outcome: Completed/Met 05/12/2022 1749 by Arther Abbott, RN Outcome: Progressing   Problem: Ischemic Stroke/TIA Tissue Perfusion: Goal: Complications of ischemic stroke/TIA will be minimized 05/12/2022 1827 by Arther Abbott, RN Outcome: Completed/Met 05/12/2022 1749 by Arther Abbott, RN Outcome: Progressing   Problem: Health Behavior/Discharge Planning: Goal: Goals  will be collaboratively established with patient/family 05/12/2022 1827 by Raj Janus, Thedore Mins, RN Outcome: Completed/Met 05/12/2022 1749 by Raj Janus, Thedore Mins, RN Outcome: Progressing   Problem: Self-Care: Goal: Ability to participate in self-care as condition permits will improve 05/12/2022 1827 by Raj Janus, Thedore Mins, RN Outcome: Completed/Met 05/12/2022 1749 by Raj Janus, Thedore Mins, RN Outcome: Progressing Goal: Ability to communicate needs accurately will improve 05/12/2022 1827 by Arther Abbott, RN Outcome: Completed/Met 05/12/2022 1749 by Arther Abbott, RN Outcome: Progressing   Problem: Nutrition: Goal: Risk of aspiration will decrease 05/12/2022 1827 by Arther Abbott, RN Outcome: Completed/Met 05/12/2022 1749 by Arther Abbott, RN Outcome: Progressing

## 2022-05-12 NOTE — Plan of Care (Signed)

## 2022-05-12 NOTE — Discharge Instructions (Signed)

## 2022-05-12 NOTE — Progress Notes (Addendum)
STROKE TEAM PROGRESS NOTE   INTERVAL HISTORY Her family is at the bedside.  She is awake alert and oriented sitting up in bed in no apparent distress She is on heparin drip.  She has been taking Coumadin for atrial fibrillation INR was 1.5 on admission currently on a heparin drip.  Spoke to her about switching Coumadin to Eliquis and she would like to proceed.  Will start Eliquis and ask pharmacy for assistance She developed left arm weakness and speech deficits with a headache that has been recurrent and associated with high blood pressure.  Numbness and weakness of left arm has resolved.  She has pacemaker that is not compatible with MRI  Vitals:   05/11/22 2020 05/11/22 2332 05/12/22 0802 05/12/22 1156  BP: (!) 156/80 (!) 167/82 (!) 160/95 137/68  Pulse: 79 81 76 78  Resp: 15 18 13 14   Temp: 98.9 F (37.2 C) 98.5 F (36.9 C) 98.2 F (36.8 C) 98.1 F (36.7 C)  TempSrc: Oral Oral Oral Oral  SpO2: 95% 96% 96% 97%  Weight:      Height:       CBC:  Recent Labs  Lab 05/11/22 1057 05/12/22 0252  WBC 10.3 10.3  NEUTROABS 6.9 7.3  HGB 13.5 13.3  HCT 42.7 41.4  MCV 80.3 79.3*  PLT 237 [id number]   Basic Metabolic Panel:  Recent Labs  Lab 05/11/22 1057 05/12/22 0252  NA 137 137  K 3.4* 3.9  CL 102 104  CO2 26 20*  GLUCOSE 165* 161*  BUN 23 23  CREATININE 1.36* 1.27*  CALCIUM 9.5 9.8  MG 2.4 2.3  PHOS 3.9 4.5   Lipid Panel:  Recent Labs  Lab 05/11/22 0331  CHOL 266*  TRIG 214*  HDL 46  CHOLHDL 5.8  VLDL 43*  LDLCALC 177*   HgbA1c:  Recent Labs  Lab 05/11/22 0331  HGBA1C 8.0*   Urine Drug Screen: No results for input(s): "LABOPIA", "COCAINSCRNUR", "LABBENZ", "AMPHETMU", "THCU", "LABBARB" in the last 168 hours.  Alcohol Level No results for input(s): "ETH" in the last 168 hours.  IMAGING past 24 hours CT HEAD WO CONTRAST (5MM)  Result Date: 05/12/2022 CLINICAL DATA:  82 year old female with neurologic deficit. EXAM: CT HEAD WITHOUT CONTRAST TECHNIQUE: Contiguous  axial images were obtained from the base of the skull through the vertex without intravenous contrast. RADIATION DOSE REDUCTION: This exam was performed according to the departmental dose-optimization program which includes automated exposure control, adjustment of the mA and/or kV according to patient size and/or use of iterative reconstruction technique. COMPARISON:  CTA head and neck yesterday, head CT 06/04/2016. FINDINGS: Brain: No midline shift, ventriculomegaly, mass effect, evidence of mass lesion, intracranial hemorrhage or evidence of cortically based acute infarction. Stable gray-white matter differentiation throughout the brain. Confluent bilateral cerebral white matter hypodensity. No definite acute cytotoxic edema. Vascular: Calcified atherosclerosis at the skull base. No suspicious intracranial vascular hyperdensity. Skull: No acute osseous abnormality identified. Sinuses/Orbits: Visualized paranasal sinuses and mastoids are stable and well aerated. Other: Stable orbit and scalp soft tissues. IMPRESSION: 1. No acute or evolving infarct identified by CT. Stable bilateral white matter disease. 2.  No new intracranial abnormality. Electronically Signed   By: Genevie Ann M.D.   On: 05/12/2022 05:09   ECHOCARDIOGRAM COMPLETE  Result Date: 05/11/2022    ECHOCARDIOGRAM REPORT   Patient Name:   Emily Abbott Date of Exam: 05/11/2022 Medical Rec #:  [id number]      Height:  66.0 in Accession #:    [id number]     Weight:       184.0 lb Date of Birth:  03/22/1940     BSA:          1.930 m Patient Age:    82 years       BP:           115/68 mmHg Patient Gender: F              HR:           86 bpm. Exam Location:  Inpatient Procedure: 2D Echo, Cardiac Doppler and Color Doppler Indications:    Stroke  History:        Patient has prior history of Echocardiogram examinations, most                 recent 07/06/2021. CAD, Mitral Valve Disease, Arrythmias:Atrial                 Fibrillation; Risk Factors:Sleep  Apnea, Dyslipidemia,                 Hypertension and Diabetes. Hx of PE, CKD.                  Mitral Valve: unknown size prosthetic prosthetic annuloplasty                 ring valve is present in the mitral position.  Sonographer:    Eartha Inch Referring Phys: [id number] TIMOTHY S OPYD  Sonographer Comments: Image acquisition challenging due to patient body habitus and Image acquisition challenging due to respiratory motion. IMPRESSIONS  1. Left ventricular ejection fraction, by estimation, is 40 to 45%. Left ventricular ejection fraction by 2D MOD biplane is 43.3 %. The left ventricle has mildly decreased function. The left ventricle demonstrates global hypokinesis. Left ventricular diastolic function could not be evaluated.  2. Right ventricular systolic function is mildly reduced. The right ventricular size is normal.  3. Right atrial size was mildly dilated.  4. The mitral valve has been repaired/replaced. Trivial mitral valve regurgitation. No evidence of mitral stenosis. There is a unknown size prosthetic prosthetic annuloplasty ring present in the mitral position.  5. The aortic valve is tricuspid. There is mild calcification of the aortic valve. Aortic valve regurgitation is not visualized. Aortic valve sclerosis/calcification is present, without any evidence of aortic stenosis. Aortic valve mean gradient measures 10.0 mmHg.  6. The inferior vena cava is normal in size with greater than 50% respiratory variability, suggesting right atrial pressure of 3 mmHg. Comparison(s): Changes from prior study are noted. 07/06/2021: LVEF 60-65%. FINDINGS  Left Ventricle: Left ventricular ejection fraction, by estimation, is 40 to 45%. Left ventricular ejection fraction by 2D MOD biplane is 43.3 %. The left ventricle has mildly decreased function. The left ventricle demonstrates global hypokinesis. The left ventricular internal cavity size was normal in size. There is no left ventricular hypertrophy. Abnormal  (paradoxical) septal motion, consistent with RV pacemaker. Left ventricular diastolic function could not be evaluated due to atrial fibrillation.  Left ventricular diastolic function could not be evaluated. Right Ventricle: The right ventricular size is normal. No increase in right ventricular wall thickness. Right ventricular systolic function is mildly reduced. Left Atrium: Left atrial size was normal in size. Right Atrium: Right atrial size was mildly dilated. Pericardium: There is no evidence of pericardial effusion. Mitral Valve: The mitral valve has been repaired/replaced. Trivial mitral valve regurgitation. There is a unknown size prosthetic prosthetic  annuloplasty ring present in the mitral position. No evidence of mitral valve stenosis. MV peak gradient, 6.8 mmHg. The mean mitral valve gradient is 3.0 mmHg. Tricuspid Valve: The tricuspid valve is grossly normal. Tricuspid valve regurgitation is mild. Aortic Valve: The aortic valve is tricuspid. There is mild calcification of the aortic valve. Aortic valve regurgitation is not visualized. Aortic valve sclerosis/calcification is present, without any evidence of aortic stenosis. Aortic valve mean gradient measures 10.0 mmHg. Aortic valve peak gradient measures 14.4 mmHg. Aortic valve area, by VTI measures 1.68 cm. Pulmonic Valve: The pulmonic valve was normal in structure. Pulmonic valve regurgitation is not visualized. Aorta: The aortic root and ascending aorta are structurally normal, with no evidence of dilitation. Venous: The inferior vena cava is normal in size with greater than 50% respiratory variability, suggesting right atrial pressure of 3 mmHg. IAS/Shunts: No atrial level shunt detected by color flow Doppler. Additional Comments: A device lead is visualized.  LEFT VENTRICLE PLAX 2D                        Biplane EF (MOD) LVIDd:         4.60 cm         LV Biplane EF:   Left LVIDs:         3.50 cm                          ventricular LV PW:          1.00 cm                          ejection LV IVS:        0.90 cm                          fraction by LVOT diam:     2.00 cm                          2D MOD LV SV:         64                               biplane is LV SV Index:   33                               43.3 %. LVOT Area:     3.14 cm                                Diastology                                LV e' medial:    3.05 cm/s LV Volumes (MOD)               LV E/e' medial:  36.1 LV vol d, MOD    107.8 ml      LV e' lateral:   7.40 cm/s A2C:                           LV E/e' lateral:  14.9 LV vol d, MOD    92.7 ml A4C: LV vol s, MOD    65.8 ml A2C: LV vol s, MOD    67.8 ml A4C: LV SV MOD A2C:   42.0 ml LV SV MOD A4C:   92.7 ml LV SV MOD BP:    43.2 ml RIGHT VENTRICLE            IVC RV S prime:     9.46 cm/s  IVC diam: 1.50 cm TAPSE (M-mode): 1.6 cm LEFT ATRIUM             Index        RIGHT ATRIUM           Index LA diam:        4.00 cm 2.07 cm/m   RA Area:     22.30 cm LA Vol (A2C):   68.7 ml 35.59 ml/m  RA Volume:   66.50 ml  34.45 ml/m LA Vol (A4C):   36.4 ml 18.86 ml/m LA Biplane Vol: 53.8 ml 27.87 ml/m  AORTIC VALVE AV Area (Vmax):    1.56 cm AV Area (Vmean):   1.51 cm AV Area (VTI):     1.68 cm AV Vmax:           190.00 cm/s AV Vmean:          149.000 cm/s AV VTI:            0.379 m AV Peak Grad:      14.4 mmHg AV Mean Grad:      10.0 mmHg LVOT Vmax:         94.60 cm/s LVOT Vmean:        71.600 cm/s LVOT VTI:          0.203 m LVOT/AV VTI ratio: 0.54  AORTA Ao Root diam: 2.70 cm Ao Asc diam:  3.20 cm MITRAL VALVE MV Area (PHT): 2.93 cm     SHUNTS MV Peak grad:  6.8 mmHg     Systemic VTI:  0.20 m MV Mean grad:  3.0 mmHg     Systemic Diam: 2.00 cm MV Vmax:       1.30 m/s MV Vmean:      84.8 cm/s MV Decel Time: 259 msec MR Peak grad: 70.2 mmHg MR Vmax:      419.00 cm/s MV E velocity: 110.00 cm/s Lyman Bishop MD Electronically signed by Lyman Bishop MD Signature Date/Time: 05/11/2022/5:35:55 PM    Final     PHYSICAL EXAM  Temp:  [98.1  F (36.7 C)-99 F (37.2 C)] 98.1 F (36.7 C) (03/23 1156) Pulse Rate:  [76-81] 78 (03/23 1156) Resp:  [13-19] 14 (03/23 1156) BP: (137-175)/(68-95) 137/68 (03/23 1156) SpO2:  [95 %-97 %] 97 % (03/23 1156) Weight:  [84.3 kg] 84.3 kg (03/22 1551)  General - Well nourished, well developed, in no apparent distress. Cardiovascular - Regular rhythm and rate.  Mental Status -  Level of arousal and orientation to time, place, and person were intact. Language including expression, naming, repetition, comprehension was assessed and found intact. Attention span and concentration were normal. Recent and remote memory were intact. Fund of Knowledge was assessed and was intact.  Cranial Nerves II - XII - II - Visual field intact OU. III, IV, VI - Extraocular movements intact. V - Facial sensation intact bilaterally. VII - Facial movement intact bilaterally. VIII - Hearing & vestibular intact bilaterally. X - Palate elevates symmetrically. XI - Chin turning & shoulder shrug intact bilaterally.  XII - Tongue protrusion intact.  Motor Strength - The patient's strength was normal in all extremities and pronator drift was absent.  Bulk was normal and fasciculations were absent.   Motor Tone - Muscle tone was assessed at the neck and appendages and was normal. Sensory - Light touch, temperature/pinprick were assessed and were symmetrical.    Coordination - The patient had normal movements in the hands and feet with no ataxia or dysmetria.  Tremor was absent.  Gait and Station - deferred.  ASSESSMENT/PLAN Emily Abbott is a 82 y.o. female with history of  atrial fibrillation on Coumadin, CKD stage III, COPD, OSA, type 2 diabetes mellitus, anxiety, and hypertension who now presents to the emergency department for evaluation of slurred speech, left facial droop, and left arm weakness.    Right hemispheric TIA likely cardioembolic on suboptimal anticoagulation with warfarin CT head No acute  abnormality.  CTA head & neck no LVO Repeat CT head no acute abnormality MRI unable to obtain due to pacemaker not compatible 2D Echo EF 40 to 45% left ventricle mildly decreased function with global hypokinesis, right ventricular function is mildly reduced, atrium mildly dilated.  Prior EF of May 2023 EF 60 to 65% LDL 177 HgbA1c 8.0 VTE prophylaxis -heparin IV    Diet   Diet regular Fluid consistency: Thin   Coumadin prior to admission, now on heparin IV.  Will change Coumadin to Eliquis and DC IV heparin Therapy recommendations: HH PT Disposition: Pending  Hypertension Home meds: Coreg 6.25 mg Stable Permissive hypertension (OK if < 220/120) but gradually normalize in 5-7 days Long-term BP goal normotensive  Paroxysmal atrial fibrillation History of PE On Coumadin at home INR 1.5 on admission On heparin drip currently, will transition to Eliquis Patient was okay with switching Coumadin to Eliquis  Hyperlipidemia Home meds: Zetia 10 mg, resumed in hospital LDL 177, goal < 70 She is intolerant to statins, consider one of the injectables such as Repatha Continue statin at discharge  Diabetes type II UnControlled Home meds: Tradjenta HgbA1c 8.0, goal < 7.0 CBGs Recent Labs    05/11/22 2113 05/12/22 0751 05/12/22 1154  GLUCAP 185* 167* 203*    SSI Will need close outpatient monitoring for diabetes management  Other Stroke Risk Factors Advanced Age >/= 44  Obesity, Body mass index is 30 kg/m., BMI >/= 30 associated with increased stroke risk, recommend weight loss, diet and exercise as appropriate  Coronary artery disease Migraines Congestive heart failure  Other Active Problems COPD CKD stage III GERD Anxiety  Hospital day # 0  Beulah Gandy DNP, ACNPC-AG  [facility]  I have personally obtained history,examined this patient, reviewed notes, independently viewed imaging studies, participated in medical decision making and plan of care.ROS  completed by me personally and pertinent positives fully documented  I have made any additions or clarifications directly to the above note. Agree with note above.  Patient presented with transient left upper extremity weakness which is resolved.  She has an MRI incompatible pacemaker but CT scan x 2 is negative for acute stroke.  She was on warfarin with suboptimal anticoagulation.  Recommend switch to Eliquis if patient can afford it.  Aggressive risk factor modification.  Patient counseled to be compliant with using Repatha which she had tried.  Discussed with patient and daughter and husband and answered questions.  Greater than 50% time during this 50-minute visit was spent on counseling and coordination of care and discussion with patient and family and care  team and answering questions Antony Contras, MD Medical Director [place] Pager: [phone] 05/12/2022 4:58 PM   To contact Stroke Continuity provider, please refer to [email or web address] After hours, contact General Neurology

## 2022-05-12 NOTE — Progress Notes (Signed)
Refused cpap

## 2022-05-12 NOTE — Plan of Care (Signed)
Problem: Education: Goal: Ability to describe self-care measures that may prevent or decrease complications (Diabetes Survival Skills Education) will improve Outcome: Progressing Goal: Individualized Educational Video(s) Outcome: Progressing   Problem: Coping: Goal: Ability to adjust to condition or change in health will improve Outcome: Progressing   Problem: Fluid Volume: Goal: Ability to maintain a balanced intake and output will improve Outcome: Progressing   Problem: Health Behavior/Discharge Planning: Goal: Ability to identify and utilize available resources and services will improve Outcome: Progressing Goal: Ability to manage health-related needs will improve Outcome: Progressing   Problem: Metabolic: Goal: Ability to maintain appropriate glucose levels will improve Outcome: Progressing   Problem: Nutritional: Goal: Maintenance of adequate nutrition will improve Outcome: Progressing Goal: Progress toward achieving an optimal weight will improve Outcome: Progressing   Problem: Skin Integrity: Goal: Risk for impaired skin integrity will decrease Outcome: Progressing   Problem: Tissue Perfusion: Goal: Adequacy of tissue perfusion will improve Outcome: Progressing   Problem: Education: Goal: Knowledge of disease or condition will improve Outcome: Progressing Goal: Knowledge of secondary prevention will improve (MUST DOCUMENT ALL) Outcome: Progressing Goal: Knowledge of patient specific risk factors will improve [place] N/A or DELETE if not current risk factor) Outcome: Progressing   Problem: Ischemic Stroke/TIA Tissue Perfusion: Goal: Complications of ischemic stroke/TIA will be minimized Outcome: Progressing   Problem: Coping: Goal: Will verbalize positive feelings about self Outcome: Progressing Goal: Will identify appropriate support needs Outcome: Progressing   Problem: Health Behavior/Discharge Planning: Goal: Ability to manage health-related needs  will improve Outcome: Progressing Goal: Goals will be collaboratively established with patient/family Outcome: Progressing   Problem: Self-Care: Goal: Ability to participate in self-care as condition permits will improve Outcome: Progressing Goal: Verbalization of feelings and concerns over difficulty with self-care will improve Outcome: Progressing Goal: Ability to communicate needs accurately will improve Outcome: Progressing   Problem: Nutrition: Goal: Risk of aspiration will decrease Outcome: Progressing Goal: Dietary intake will improve Outcome: Progressing   Problem: Education: Goal: Knowledge of General Education information will improve Description: Including pain rating scale, medication(s)/side effects and non-pharmacologic comfort measures Outcome: Progressing   Problem: Health Behavior/Discharge Planning: Goal: Ability to manage health-related needs will improve Outcome: Progressing   Problem: Clinical Measurements: Goal: Ability to maintain clinical measurements within normal limits will improve Outcome: Progressing Goal: Will remain free from infection Outcome: Progressing Goal: Diagnostic test results will improve Outcome: Progressing Goal: Respiratory complications will improve Outcome: Progressing Goal: Cardiovascular complication will be avoided Outcome: Progressing   Problem: Activity: Goal: Risk for activity intolerance will decrease Outcome: Progressing   Problem: Nutrition: Goal: Adequate nutrition will be maintained Outcome: Progressing   Problem: Coping: Goal: Level of anxiety will decrease Outcome: Progressing   Problem: Elimination: Goal: Will not experience complications related to bowel motility Outcome: Progressing Goal: Will not experience complications related to urinary retention Outcome: Progressing   Problem: Education: Goal: Knowledge of disease or condition will improve Outcome: Progressing Goal: Knowledge of secondary  prevention will improve (MUST DOCUMENT ALL) Outcome: Progressing Goal: Knowledge of patient specific risk factors will improve [place] N/A or DELETE if not current risk factor) Outcome: Progressing   Problem: Ischemic Stroke/TIA Tissue Perfusion: Goal: Complications of ischemic stroke/TIA will be minimized Outcome: Progressing   Problem: Health Behavior/Discharge Planning: Goal: Goals will be collaboratively established with patient/family Outcome: Progressing   Problem: Self-Care: Goal: Ability to participate in self-care as condition permits will improve Outcome: Progressing Goal: Ability to communicate needs accurately will improve Outcome: Progressing   Problem: Nutrition: Goal: Risk of  aspiration will decrease Outcome: Progressing

## 2022-05-12 NOTE — Discharge Summary (Signed)
Physician Discharge Summary   Patient: Emily Abbott MRN: [id number] DOB: 1940-11-11  Admit date:     05/10/2022  Discharge date: {dischdate:26783}  Discharge Physician: Kerney Elbe   PCP: Harlan Stains, MD   Recommendations at discharge:   Follow up with PCP withn  Follow up with Neurology Follow up with Pulmonary and have outpatient Non-Contrast CT Scan for 1.7 cm nodule that has worsened  Discharge Diagnoses: Principal Problem:   Transient neurologic deficit Active Problems:   Anxiety state   PAROXYSMAL ATRIAL FIBRILLATION   COPD (chronic obstructive pulmonary disease) (HCC)   Lung nodule, solitary   Obstructive sleep apnea   Stage 3b chronic kidney disease (CKD) ([place])  Resolved Problems:   * No resolved hospital problems. [facility] Course: The patient is a obese Caucasian female with a past medical history significant for but limited to atrial fibrillation on anticoagulation with Coumadin, CKD stage IIIa, COPD, OSA, diabetes mellitus type 2, anxiety and hypertension as well as other comorbidities presented to the ED for evaluation of slurred speech, left facial droop and left arm weakness.  She reported that she been experiencing a headache and dizziness intermittently for over a week has been in her usual state of health otherwise until yesterday evening when she had slurred speech and left facial droop.  She had felt her arm was weak and numb.  EMS was called and blood pressure was found to be elevated 180/110 and she brought to the ED.  Symptoms resolved approximately after 5 minutes.  Upon arrival to ED she is found to be afebrile and saturating well on room air with normal heart rate and elevated blood pressure.  EKG demonstrated a flutter with left bundle branch block.  Chest x-ray was negative for any acute cardiopulmonary disease and a head CT was negative for acute intracranial abnormalities.  CTA was done and was negative for LVO or any other emergent findings.   She was noted to have a right upper lobe lung nodule that was incidentally found on CT scan.  Labs were most notable for creatinine 1.22 and an INR 1.6.  Neurology was consulted in the ED and she was started on IV heparin for stroke protocol.  MRI was ordered however unable to be done given that her pacer is compatible.  Neurology recommending likely starting an oral anticoagulant and possibly recommending Eliquis.  She underwent further stroke evaluation with an A1c, telemetry as well as echocardiogram and stroke team will see the patient in the morning.   Assessment and Plan: No notes have been filed under this hospital service. Service: Hospitalist  Transient speech difficulty and left-sided weakness  - Back to baseline in ED; no acute findings on head CT; no LVO or other emergent CTA finding  -Continue cardiac monitoring and frequent neuro checks, c -MRI brain not able to be done as PPM is Not compatible, -Check echo, A1c, and lipids, consult PT/OT/SLP   -Repeat CT Scan of Head in the AM    Paroxysmal atrial fibrillation; subtherapeutic INR  -INR 1.6 in ED  -Neurology recommends IV heparin with stroke protocol   -Further anticoagulation recommendations Neurology   COPD; OSA  -Not in exacerbation on admission  -Continue ICS-LAMA-LABA and as-needed albuterol, continue CPAP qHS  -SpO2: 95 % -Continue to Monitor Respiratory Status carefully   Anxiety  -Continue Paroxetine 30 mg p.o. daily   Diabetes Mellitus Type 2 -Continue with Semglee 8 units subcu daily, as well as very sensitive NovoLog/scale insulin ACHS -Hemoglobin  A1c in the a.m. -CBG Trend: Last Labs       Recent Labs  Lab 05/11/22 0814 05/11/22 1206 05/11/22 1618  GLUCAP 162* 202* 221*      HLD -Lipid Panel done and showed cholesterol/HDL ratio 5.8, cholesterol level 266, HDL 46, LDL 177, triglycerides of 214, VLDL 43 -Not on Atorvastatin due to Side-effects -Will defer to Neuro to Initiate Statin   CKD IIIb   -Appears close to baseline  -BUN/Cr Trend: Last Labs      Recent Labs  Lab 05/10/22 2237 05/11/22 1057  BUN 23 23  CREATININE 1.22* 1.36*    -Avoid Nephrotoxic Medications, Contrast Dyes, Hypotension and Dehydration to Ensure Adequate Renal Perfusion and will need to Renally Adjust Meds -Continue to Monitor and Trend Renal Function carefully and repeat CMP in the AM    Hypokalemia -Patient's K+ Level Trend: Last Labs      Recent Labs  Lab 05/10/22 2237 05/11/22 1057  K 3.4* 3.4*    -Replete with po Kcl 40 mEQ BID x2 -Continue to Monitor and Replete as Necessary -Repeat CMP in the AM    Lung Nodule  -Noted incidentally on CT in ED  -Discussed with patient and her daughters, outpatient follow-up with her pulmonologist recommended     GERD/GI Prophylaxis -C/w Pantoprazole 40 mg po Daily   Obesity -Complicates overall prognosis and care -Estimated body mass index is 30 kg/m as calculated from the following:   Height as of this encounter: 5\' 6"  (1.676 m).   Weight as of this encounter: 84.3 kg.  -Weight Loss and Dietary Counseling given     {Tip this will not be part of the note when signed Body mass index is 30 kg/m. , ,  (Optional):26781}  {(NOTE) Pain control PDMP Statment (Optional):26782} Consultants: *** Procedures performed: ***  Disposition: {Plan; Disposition:26390} Diet recommendation:  {Diet_Plan:26776} DISCHARGE MEDICATION: Allergies as of 05/12/2022       Reactions   Metformin And Related Other (See Comments)   "caused me kidney problems"    Adhesive [tape] Other (See Comments)   Burning sensation   Atorvastatin Other (See Comments)   Makes legs hurt    Sulfonamide Derivatives Other (See Comments)   itch        Medication List     STOP taking these medications    warfarin 5 MG tablet Commonly known as: COUMADIN       TAKE these medications    acetaminophen 325 MG tablet Commonly known as: TYLENOL Take 2 tablets (650 mg  total) by mouth every 6 (six) hours as needed for mild pain or moderate pain.   albuterol 108 (90 Base) MCG/ACT inhaler Commonly known as: ProAir HFA Inhale 1-2 puffs into the lungs every 6 (six) hours as needed for wheezing or shortness of breath.   ALPRAZolam 0.5 MG tablet Commonly known as: XANAX Take 0.5 mg by mouth 2 (two) times daily as needed for anxiety or sleep.   apixaban 5 MG Tabs tablet Commonly known as: ELIQUIS Take 1 tablet (5 mg total) by mouth 2 (two) times daily. Start taking on: May 13, 2022   Azelastine HCl 137 MCG/SPRAY Soln Place 2 sprays into both nostrils at bedtime.   Basaglar KwikPen 100 UNIT/ML Inject 14 Units into the skin daily.   calcium carbonate 750 MG chewable tablet Commonly known as: TUMS EX Chew 2 tablets by mouth as needed for heartburn.   carvedilol 6.25 MG tablet Commonly known as: COREG Take 6.25 mg by mouth  2 (two) times daily with a meal.   cholecalciferol 25 MCG (1000 UNIT) tablet Commonly known as: VITAMIN D3 Take 1,000 Units by mouth daily.   diphenhydrAMINE 25 MG tablet Commonly known as: BENADRYL Take 12.5 mg by mouth daily as needed for itching.   ezetimibe 10 MG tablet Commonly known as: ZETIA Take 10 mg by mouth daily.   furosemide 40 MG tablet Commonly known as: LASIX Take one tablet in the AM and one tablet after lunch (early afternoon).   HYDROcodone-acetaminophen 5-325 MG tablet Commonly known as: NORCO/VICODIN Take 0.5-1 tablets by mouth at bedtime.   ipratropium-albuterol 0.5-2.5 (3) MG/3ML Soln Commonly known as: DUONEB Take 3 mLs by nebulization every 6 (six) hours as needed.   linagliptin 5 MG Tabs tablet Commonly known as: TRADJENTA Take 5 mg by mouth daily.   magnesium gluconate 500 MG tablet Commonly known as: MAGONATE Take 500 mg by mouth daily as needed (for leg cramps).   omeprazole 40 MG capsule Commonly known as: PRILOSEC Take 40 mg by mouth daily.   OXYGEN Inhale 1.5 L into the  lungs at bedtime.   PARoxetine 30 MG tablet Commonly known as: PAXIL Take 30 mg by mouth daily.   tiZANidine 2 MG tablet Commonly known as: ZANAFLEX Take 2 mg by mouth at bedtime as needed for muscle spasms.   Trelegy Ellipta 200-62.5-25 MCG/ACT Aepb Generic drug: Fluticasone-Umeclidin-Vilant Inhale 1 puff into the lungs daily. What changed: Another medication with the same name was removed. Continue taking this medication, and follow the directions you see here.        Follow-up Information     [facility] [place] Follow [facility].   Specialty: Home Health Services Why: [facility] will provide Home Health physical therapy and will contact you to schedule initial home visit Contact information: [address] [place] [address] [phone]                Discharge Exam: Danley Danker Weights   05/10/22 2118 05/11/22 1551  Weight: 83.5 kg 84.3 kg   ***  Condition at discharge: {DC Condition:26389}  The results of significant diagnostics from this hospitalization (including imaging, microbiology, ancillary and laboratory) are listed below for reference.   Imaging Studies: CT HEAD WO CONTRAST (5MM)  Result Date: 05/12/2022 CLINICAL DATA:  82 year old female with neurologic deficit. EXAM: CT HEAD WITHOUT CONTRAST TECHNIQUE: Contiguous axial images were obtained from the base of the skull through the vertex without intravenous contrast. RADIATION DOSE REDUCTION: This exam was performed according to the departmental dose-optimization program which includes automated exposure control, adjustment of the mA and/or kV according to patient size and/or use of iterative reconstruction technique. COMPARISON:  CTA head and neck yesterday, head CT 06/04/2016. FINDINGS: Brain: No midline shift, ventriculomegaly, mass effect, evidence of mass lesion, intracranial hemorrhage or evidence of cortically based acute infarction. Stable gray-white matter differentiation throughout the brain.  Confluent bilateral cerebral white matter hypodensity. No definite acute cytotoxic edema. Vascular: Calcified atherosclerosis at the skull base. No suspicious intracranial vascular hyperdensity. Skull: No acute osseous abnormality identified. Sinuses/Orbits: Visualized paranasal sinuses and mastoids are stable and well aerated. Other: Stable orbit and scalp soft tissues. IMPRESSION: 1. No acute or evolving infarct identified by CT. Stable bilateral white matter disease. 2.  No new intracranial abnormality. Electronically Signed   By: Genevie Ann M.D.   On: 05/12/2022 05:09   ECHOCARDIOGRAM COMPLETE  Result Date: 05/11/2022    ECHOCARDIOGRAM REPORT   Patient Name:   Emily Abbott  Metzger Date of Exam: 05/11/2022 Medical Rec #:  [id number]      Height:       66.0 in Accession #:    [id number]     Weight:       184.0 lb Date of Birth:  10-May-1940     BSA:          1.930 m Patient Age:    59 years       BP:           115/68 mmHg Patient Gender: F              HR:           86 bpm. Exam Location:  Inpatient Procedure: 2D Echo, Cardiac Doppler and Color Doppler Indications:    Stroke  History:        Patient has prior history of Echocardiogram examinations, most                 recent 07/06/2021. CAD, Mitral Valve Disease, Arrythmias:Atrial                 Fibrillation; Risk Factors:Sleep Apnea, Dyslipidemia,                 Hypertension and Diabetes. Hx of PE, CKD.                  Mitral Valve: unknown size prosthetic prosthetic annuloplasty                 ring valve is present in the mitral position.  Sonographer:    Eartha Inch Referring Phys: [id number] TIMOTHY S OPYD  Sonographer Comments: Image acquisition challenging due to patient body habitus and Image acquisition challenging due to respiratory motion. IMPRESSIONS  1. Left ventricular ejection fraction, by estimation, is 40 to 45%. Left ventricular ejection fraction by 2D MOD biplane is 43.3 %. The left ventricle has mildly decreased function. The left ventricle  demonstrates global hypokinesis. Left ventricular diastolic function could not be evaluated.  2. Right ventricular systolic function is mildly reduced. The right ventricular size is normal.  3. Right atrial size was mildly dilated.  4. The mitral valve has been repaired/replaced. Trivial mitral valve regurgitation. No evidence of mitral stenosis. There is a unknown size prosthetic prosthetic annuloplasty ring present in the mitral position.  5. The aortic valve is tricuspid. There is mild calcification of the aortic valve. Aortic valve regurgitation is not visualized. Aortic valve sclerosis/calcification is present, without any evidence of aortic stenosis. Aortic valve mean gradient measures 10.0 mmHg.  6. The inferior vena cava is normal in size with greater than 50% respiratory variability, suggesting right atrial pressure of 3 mmHg. Comparison(s): Changes from prior study are noted. 07/06/2021: LVEF 60-65%. FINDINGS  Left Ventricle: Left ventricular ejection fraction, by estimation, is 40 to 45%. Left ventricular ejection fraction by 2D MOD biplane is 43.3 %. The left ventricle has mildly decreased function. The left ventricle demonstrates global hypokinesis. The left ventricular internal cavity size was normal in size. There is no left ventricular hypertrophy. Abnormal (paradoxical) septal motion, consistent with RV pacemaker. Left ventricular diastolic function could not be evaluated due to atrial fibrillation.  Left ventricular diastolic function could not be evaluated. Right Ventricle: The right ventricular size is normal. No increase in right ventricular wall thickness. Right ventricular systolic function is mildly reduced. Left Atrium: Left atrial size was normal in size. Right Atrium: Right atrial size was mildly dilated. Pericardium: There is no evidence of  pericardial effusion. Mitral Valve: The mitral valve has been repaired/replaced. Trivial mitral valve regurgitation. There is a unknown size prosthetic  prosthetic annuloplasty ring present in the mitral position. No evidence of mitral valve stenosis. MV peak gradient, 6.8 mmHg. The mean mitral valve gradient is 3.0 mmHg. Tricuspid Valve: The tricuspid valve is grossly normal. Tricuspid valve regurgitation is mild. Aortic Valve: The aortic valve is tricuspid. There is mild calcification of the aortic valve. Aortic valve regurgitation is not visualized. Aortic valve sclerosis/calcification is present, without any evidence of aortic stenosis. Aortic valve mean gradient measures 10.0 mmHg. Aortic valve peak gradient measures 14.4 mmHg. Aortic valve area, by VTI measures 1.68 cm. Pulmonic Valve: The pulmonic valve was normal in structure. Pulmonic valve regurgitation is not visualized. Aorta: The aortic root and ascending aorta are structurally normal, with no evidence of dilitation. Venous: The inferior vena cava is normal in size with greater than 50% respiratory variability, suggesting right atrial pressure of 3 mmHg. IAS/Shunts: No atrial level shunt detected by color flow Doppler. Additional Comments: A device lead is visualized.  LEFT VENTRICLE PLAX 2D                        Biplane EF (MOD) LVIDd:         4.60 cm         LV Biplane EF:   Left LVIDs:         3.50 cm                          ventricular LV PW:         1.00 cm                          ejection LV IVS:        0.90 cm                          fraction by LVOT diam:     2.00 cm                          2D MOD LV SV:         64                               biplane is LV SV Index:   33                               43.3 %. LVOT Area:     3.14 cm                                Diastology                                LV e' medial:    3.05 cm/s LV Volumes (MOD)               LV E/e' medial:  36.1 LV vol d, MOD    107.8 ml      LV e' lateral:   7.40 cm/s A2C:  LV E/e' lateral: 14.9 LV vol d, MOD    92.7 ml A4C: LV vol s, MOD    65.8 ml A2C: LV vol s, MOD    67.8 ml A4C: LV SV  MOD A2C:   42.0 ml LV SV MOD A4C:   92.7 ml LV SV MOD BP:    43.2 ml RIGHT VENTRICLE            IVC RV S prime:     9.46 cm/s  IVC diam: 1.50 cm TAPSE (M-mode): 1.6 cm LEFT ATRIUM             Index        RIGHT ATRIUM           Index LA diam:        4.00 cm 2.07 cm/m   RA Area:     22.30 cm LA Vol (A2C):   68.7 ml 35.59 ml/m  RA Volume:   66.50 ml  34.45 ml/m LA Vol (A4C):   36.4 ml 18.86 ml/m LA Biplane Vol: 53.8 ml 27.87 ml/m  AORTIC VALVE AV Area (Vmax):    1.56 cm AV Area (Vmean):   1.51 cm AV Area (VTI):     1.68 cm AV Vmax:           190.00 cm/s AV Vmean:          149.000 cm/s AV VTI:            0.379 m AV Peak Grad:      14.4 mmHg AV Mean Grad:      10.0 mmHg LVOT Vmax:         94.60 cm/s LVOT Vmean:        71.600 cm/s LVOT VTI:          0.203 m LVOT/AV VTI ratio: 0.54  AORTA Ao Root diam: 2.70 cm Ao Asc diam:  3.20 cm MITRAL VALVE MV Area (PHT): 2.93 cm     SHUNTS MV Peak grad:  6.8 mmHg     Systemic VTI:  0.20 m MV Mean grad:  3.0 mmHg     Systemic Diam: 2.00 cm MV Vmax:       1.30 m/s MV Vmean:      84.8 cm/s MV Decel Time: 259 msec MR Peak grad: 70.2 mmHg MR Vmax:      419.00 cm/s MV E velocity: 110.00 cm/s Lyman Bishop MD Electronically signed by Lyman Bishop MD Signature Date/Time: 05/11/2022/5:35:55 PM    Final    CT CHEST WO CONTRAST  Result Date: 05/11/2022 CLINICAL DATA:  Pulmonary nodule EXAM: CT CHEST WITHOUT CONTRAST TECHNIQUE: Multidetector CT imaging of the chest was performed following the standard protocol without IV contrast. RADIATION DOSE REDUCTION: This exam was performed according to the departmental dose-optimization program which includes automated exposure control, adjustment of the mA and/or kV according to patient size and/or use of iterative reconstruction technique. COMPARISON:  09/18/2019 FINDINGS: Cardiovascular: Moderate multi-vessel coronary artery calcification. Mitral valve replacement has been performed. Global cardiac size within normal limits. No  pericardial effusion. Left subclavian 3 lead pacemaker is in place with leads within the right atrium, right ventricle, and left ventricular venous outflow. Central pulmonary arteries are mildly enlarged in keeping with changes of pulmonary arterial hypertension. Moderate atherosclerotic calcification is seen within the thoracic aorta, particularly at the origin of the left subclavian artery. No aortic aneurysm. Mediastinum/Nodes: Visualized thyroid is unremarkable. No pathologic adenopathy. Esophagus unremarkable. Small hiatal hernia. Lungs/Pleura: Right upper lobe pulmonary nodule  described on previously performed CTA examination as again identified demonstrating a mean diameter of 11 mm demonstrating uniform soft tissue attenuation. This is indeterminate. New 7 mm somewhat inflammatory appearing pulmonary nodule is seen within the right lower lobe, axial image # 97/4, indeterminate. Additional solid noncalcified pulmonary nodules within the anterior right upper lobe, right middle lobe, lower lobes bilaterally are unchanged and previously characterized as benign. Additionally, 10 mm ground-glass pulmonary nodule within the left upper lobe, axial image # 49/4 is stable since remote prior examination of 11/08/2016 and is safely considered benign. Upper Abdomen: No acute abnormality. Musculoskeletal: No acute bone abnormality. No lytic or blastic bone lesion. Osseous structures are age-appropriate. IMPRESSION: 1. New 11 mm right upper lobe pulmonary nodule, indeterminate. Consider one of the following in 3 months for both low-risk and high-risk individuals: (a) repeat chest CT, (b) follow-up PET-CT, or (c) tissue sampling. This recommendation follows the consensus statement: Guidelines for Management of Incidental Pulmonary Nodules Detected on CT Images: From the Fleischner Society 2017; Radiology 2017; 284:228-243. New 7 mm somewhat inflammatory appearing pulmonary nodule within the right lower lobe can be  reassessed simultaneously. 2. Moderate multi-vessel coronary artery calcification. 3. Morphologic changes in keeping with pulmonary arterial hypertension. 4. Small hiatal hernia. Aortic Atherosclerosis (ICD10-I70.0). Electronically Signed   By: Fidela Salisbury M.D.   On: 05/11/2022 03:33   CT Angio Head Neck W WO CM  Result Date: 05/11/2022 CLINICAL DATA:  Initial evaluation for neuro deficit, stroke suspected. EXAM: CT ANGIOGRAPHY HEAD AND NECK TECHNIQUE: Multidetector CT imaging of the head and neck was performed using the standard protocol during bolus administration of intravenous contrast. Multiplanar CT image reconstructions and MIPs were obtained to evaluate the vascular anatomy. Carotid stenosis measurements (when applicable) are obtained utilizing NASCET criteria, using the distal internal carotid diameter as the denominator. RADIATION DOSE REDUCTION: This exam was performed according to the departmental dose-optimization program which includes automated exposure control, adjustment of the mA and/or kV according to patient size and/or use of iterative reconstruction technique. CONTRAST:  62mL OMNIPAQUE IOHEXOL 350 MG/ML SOLN COMPARISON:  Prior study from 06/04/2016. FINDINGS: CT HEAD FINDINGS Brain: Cerebral volume within normal limits for age. Patchy and confluent hypodensity involving the supratentorial cerebral white matter, most characteristic of moderate chronic microvascular ischemic disease. No acute intracranial hemorrhage. No acute large vessel territory infarct. No mass lesion or midline shift. No hydrocephalus or extra-axial fluid collection. Vascular: No hyperdense vessel. Calcified atherosclerosis present at the skull base Skull: Scalp soft tissues and calvarium demonstrate no acute finding. Sinuses/Orbits: Globes normal soft tissues within normal limits. Visualized paranasal sinuses are clear. No mastoid effusion. Other: None. Review of the MIP images confirms the above findings CTA NECK  FINDINGS Aortic arch: Visualized aortic arch normal caliber with standard 3 vessel morphology. Moderate atheromatous change about the arch and origin of the great vessels. Right carotid system: The right common and internal carotid arteries are patent without dissection. Moderate atheromatous change about the right CCA and right carotid bulb without hemodynamically significant greater than 50% stenosis. Left carotid system: Left common and internal carotid arteries are patent without dissection. Mild atheromatous change about the left CCA and left carotid bulb without hemodynamically significant greater than 50% stenosis. Vertebral arteries: Both vertebral arteries arise from subclavian arteries. Atheromatous change about the origin of the left subclavian artery with up to 60% stenosis. Vertebral arteries M cells are patent without significant stenosis or dissection. Skeleton: No discrete or worrisome osseous lesions. Moderate cervical spondylosis, most pronounced at  C5-6 and C6-7. Patient is edentulous. Other neck: No other acute soft tissue abnormality within the neck. Upper chest: Median sternotomy wires with left-sided pacemaker/AICD noted. 1.7 cm nodular density present within the right upper lobe (series 10, image 155), indeterminate. This is new as compared to prior chest CT from 09/18/2019. Review of the MIP images confirms the above findings CTA HEAD FINDINGS Anterior circulation: Atheromatous change seen within the carotid siphons with associated mild multifocal narrowing. A1 segments patent bilaterally. Left A1 hypoplastic. Normal anterior communicating complex. Anterior cerebral arteries patent without significant stenosis. Left M1 segment widely patent. Focal severe stenosis involving the distal right M1 segment/right MCA bifurcation (series 15, image 21). Distal MCA branches perfused bilaterally. Posterior circulation: Both V4 segments are patent without stenosis. Right vertebral artery slightly  dominant. Both PICA patent. Basilar patent without significant stenosis. Superior cerebral arteries patent bilaterally. Both PCAs primarily supplied via the basilar. Right PCA patent to its distal aspect without high-grade stenosis. Focal severe stenosis noted at the proximal left P1/P2 junction (series 15, image 21). There is an additional severe near occlusive stenosis involving the left P2 segment distally (series 15, image 21). The left PCA is markedly attenuated and irregular but appears to be grossly patent distally, best appreciated on coronal MPR sequence (series 13). Venous sinuses: Patent allowing for timing the contrast bolus. Anatomic variants: As above.  No aneurysm. Review of the MIP images confirms the above findings IMPRESSION: CT HEAD IMPRESSION: 1. No acute intracranial abnormality. 2. Moderate chronic microvascular ischemic disease. CTA HEAD AND NECK IMPRESSION: 1. Negative CTA for large vessel occlusion or other emergent finding. 2. 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 The left PCA is markedly attenuated and irregular but remains patent distally. 3. Atheromatous change about the origin of the left subclavian artery with up to 60% stenosis. No other hemodynamically significant stenosis within the neck. 4. 1.7 cm right upper lobe nodule, indeterminate, but new as compared to prior chest CT from 09/18/2019. Per Fleischner Society Guidelines, recommend prompt non-contrast Chest CT for further evaluation. Aortic Atherosclerosis (ICD10-I70.0). Electronically Signed   By: Jeannine Boga M.D.   On: 05/11/2022 01:38   DG Chest 2 View  Result Date: 05/10/2022 CLINICAL DATA:  Dizziness EXAM: CHEST - 2 VIEW COMPARISON:  07/17/2019 FINDINGS: Left pacer remains in place, unchanged. Prior CABG. Heart and mediastinal contours are within normal limits. No focal opacities  or effusions. No acute bony abnormality. IMPRESSION: No active cardiopulmonary disease. Electronically Signed   By: Rolm Baptise M.D.   On: 05/10/2022 22:30    Microbiology: Results for orders placed or performed during the hospital encounter of 06/06/17  Surgical PCR screen     Status: None   Collection Time: 06/06/17  8:07 AM   Specimen: Nasal Mucosa; Nasal Swab  Result Value Ref Range Status   MRSA, PCR NEGATIVE NEGATIVE Final   Staphylococcus aureus NEGATIVE NEGATIVE Final    Comment: (NOTE) The Xpert SA Assay (FDA approved for NASAL specimens in patients 49 years of age and older), is one component of a comprehensive surveillance program. It is not intended to diagnose infection nor to guide or monitor treatment. Performed at [facility], [place] [address], [place], [place] [address]     Labs: CBC: Recent Labs  Lab 05/10/22 2237 05/11/22 1057 05/12/22 0252  WBC 10.6* 10.3 10.3  NEUTROABS  --  6.9 7.3  HGB 13.8 13.5 13.3  HCT 42.7 42.7 41.4  MCV 79.4* 80.3 79.3*  PLT 217 237 [id number]   Basic Metabolic Panel: Recent Labs  Lab 05/10/22 2237 05/11/22 1057 05/12/22 0252  NA 136 137 137  K 3.4* 3.4* 3.9  CL 103 102 104  CO2 25 26 20*  GLUCOSE 150* 165* 161*  BUN 23 23 23   CREATININE 1.22* 1.36* 1.27*  CALCIUM 9.7 9.5 9.8  MG  --  2.4 2.3  PHOS  --  3.9 4.5   Liver Function Tests: Recent Labs  Lab 05/10/22 2237 05/11/22 1057 05/12/22 0252  AST 18 20 14*  ALT 14 13 13   ALKPHOS 75 71 68  BILITOT 0.6 0.7 0.7  PROT 7.3 6.8 6.7  ALBUMIN 4.0 3.8 3.7   CBG: Recent Labs  Lab 05/11/22 1618 05/11/22 2113 05/12/22 0751 05/12/22 1154 05/12/22 1532  GLUCAP 221* 185* 167* 203* 209*    Discharge time spent: {LESS THAN/GREATER THAN:26388} 30 minutes.  Signed: Kerney Elbe, DO [facility] 05/12/2022

## 2022-05-14 DIAGNOSIS — E1121 Type 2 diabetes mellitus with diabetic nephropathy: Secondary | ICD-10-CM | POA: Diagnosis not present

## 2022-05-14 DIAGNOSIS — I10 Essential (primary) hypertension: Secondary | ICD-10-CM | POA: Diagnosis not present

## 2022-05-14 DIAGNOSIS — E785 Hyperlipidemia, unspecified: Secondary | ICD-10-CM | POA: Diagnosis not present

## 2022-05-14 DIAGNOSIS — K219 Gastro-esophageal reflux disease without esophagitis: Secondary | ICD-10-CM | POA: Diagnosis not present

## 2022-05-15 DIAGNOSIS — G459 Transient cerebral ischemic attack, unspecified: Secondary | ICD-10-CM | POA: Diagnosis not present

## 2022-05-15 DIAGNOSIS — I480 Paroxysmal atrial fibrillation: Secondary | ICD-10-CM | POA: Diagnosis not present

## 2022-05-15 DIAGNOSIS — E1121 Type 2 diabetes mellitus with diabetic nephropathy: Secondary | ICD-10-CM | POA: Diagnosis not present

## 2022-05-15 DIAGNOSIS — B370 Candidal stomatitis: Secondary | ICD-10-CM | POA: Diagnosis not present

## 2022-05-15 DIAGNOSIS — R911 Solitary pulmonary nodule: Secondary | ICD-10-CM | POA: Diagnosis not present

## 2022-05-15 DIAGNOSIS — I10 Essential (primary) hypertension: Secondary | ICD-10-CM | POA: Diagnosis not present

## 2022-05-26 DIAGNOSIS — J438 Other emphysema: Secondary | ICD-10-CM | POA: Diagnosis not present

## 2022-05-30 DIAGNOSIS — Z7901 Long term (current) use of anticoagulants: Secondary | ICD-10-CM | POA: Diagnosis not present

## 2022-05-30 DIAGNOSIS — I2510 Atherosclerotic heart disease of native coronary artery without angina pectoris: Secondary | ICD-10-CM | POA: Diagnosis not present

## 2022-05-30 DIAGNOSIS — E213 Hyperparathyroidism, unspecified: Secondary | ICD-10-CM | POA: Diagnosis not present

## 2022-05-30 DIAGNOSIS — J449 Chronic obstructive pulmonary disease, unspecified: Secondary | ICD-10-CM | POA: Diagnosis not present

## 2022-05-30 DIAGNOSIS — D631 Anemia in chronic kidney disease: Secondary | ICD-10-CM | POA: Diagnosis not present

## 2022-05-30 DIAGNOSIS — R29818 Other symptoms and signs involving the nervous system: Secondary | ICD-10-CM | POA: Diagnosis not present

## 2022-05-30 DIAGNOSIS — I4892 Unspecified atrial flutter: Secondary | ICD-10-CM | POA: Diagnosis not present

## 2022-05-30 DIAGNOSIS — F419 Anxiety disorder, unspecified: Secondary | ICD-10-CM | POA: Diagnosis not present

## 2022-05-30 DIAGNOSIS — Z7984 Long term (current) use of oral hypoglycemic drugs: Secondary | ICD-10-CM | POA: Diagnosis not present

## 2022-05-30 DIAGNOSIS — J9611 Chronic respiratory failure with hypoxia: Secondary | ICD-10-CM | POA: Diagnosis not present

## 2022-05-30 DIAGNOSIS — Z794 Long term (current) use of insulin: Secondary | ICD-10-CM | POA: Diagnosis not present

## 2022-05-30 DIAGNOSIS — M1990 Unspecified osteoarthritis, unspecified site: Secondary | ICD-10-CM | POA: Diagnosis not present

## 2022-05-30 DIAGNOSIS — G4733 Obstructive sleep apnea (adult) (pediatric): Secondary | ICD-10-CM | POA: Diagnosis not present

## 2022-05-30 DIAGNOSIS — E1122 Type 2 diabetes mellitus with diabetic chronic kidney disease: Secondary | ICD-10-CM | POA: Diagnosis not present

## 2022-05-30 DIAGNOSIS — I480 Paroxysmal atrial fibrillation: Secondary | ICD-10-CM | POA: Diagnosis not present

## 2022-05-30 DIAGNOSIS — M25561 Pain in right knee: Secondary | ICD-10-CM | POA: Diagnosis not present

## 2022-05-30 DIAGNOSIS — N1832 Chronic kidney disease, stage 3b: Secondary | ICD-10-CM | POA: Diagnosis not present

## 2022-05-30 DIAGNOSIS — I059 Rheumatic mitral valve disease, unspecified: Secondary | ICD-10-CM | POA: Diagnosis not present

## 2022-05-30 DIAGNOSIS — E782 Mixed hyperlipidemia: Secondary | ICD-10-CM | POA: Diagnosis not present

## 2022-05-30 DIAGNOSIS — I129 Hypertensive chronic kidney disease with stage 1 through stage 4 chronic kidney disease, or unspecified chronic kidney disease: Secondary | ICD-10-CM | POA: Diagnosis not present

## 2022-05-30 DIAGNOSIS — I447 Left bundle-branch block, unspecified: Secondary | ICD-10-CM | POA: Diagnosis not present

## 2022-05-30 DIAGNOSIS — R911 Solitary pulmonary nodule: Secondary | ICD-10-CM | POA: Diagnosis not present

## 2022-05-30 DIAGNOSIS — E669 Obesity, unspecified: Secondary | ICD-10-CM | POA: Diagnosis not present

## 2022-05-30 DIAGNOSIS — K219 Gastro-esophageal reflux disease without esophagitis: Secondary | ICD-10-CM | POA: Diagnosis not present

## 2022-06-07 DIAGNOSIS — J449 Chronic obstructive pulmonary disease, unspecified: Secondary | ICD-10-CM | POA: Diagnosis not present

## 2022-06-07 DIAGNOSIS — I2510 Atherosclerotic heart disease of native coronary artery without angina pectoris: Secondary | ICD-10-CM | POA: Diagnosis not present

## 2022-06-07 DIAGNOSIS — M25561 Pain in right knee: Secondary | ICD-10-CM | POA: Diagnosis not present

## 2022-06-07 DIAGNOSIS — K219 Gastro-esophageal reflux disease without esophagitis: Secondary | ICD-10-CM | POA: Diagnosis not present

## 2022-06-07 DIAGNOSIS — J9611 Chronic respiratory failure with hypoxia: Secondary | ICD-10-CM | POA: Diagnosis not present

## 2022-06-07 DIAGNOSIS — R911 Solitary pulmonary nodule: Secondary | ICD-10-CM | POA: Diagnosis not present

## 2022-06-07 DIAGNOSIS — Z794 Long term (current) use of insulin: Secondary | ICD-10-CM | POA: Diagnosis not present

## 2022-06-07 DIAGNOSIS — E1122 Type 2 diabetes mellitus with diabetic chronic kidney disease: Secondary | ICD-10-CM | POA: Diagnosis not present

## 2022-06-07 DIAGNOSIS — E782 Mixed hyperlipidemia: Secondary | ICD-10-CM | POA: Diagnosis not present

## 2022-06-07 DIAGNOSIS — R29818 Other symptoms and signs involving the nervous system: Secondary | ICD-10-CM | POA: Diagnosis not present

## 2022-06-07 DIAGNOSIS — E669 Obesity, unspecified: Secondary | ICD-10-CM | POA: Diagnosis not present

## 2022-06-07 DIAGNOSIS — N1832 Chronic kidney disease, stage 3b: Secondary | ICD-10-CM | POA: Diagnosis not present

## 2022-06-07 DIAGNOSIS — D631 Anemia in chronic kidney disease: Secondary | ICD-10-CM | POA: Diagnosis not present

## 2022-06-07 DIAGNOSIS — E213 Hyperparathyroidism, unspecified: Secondary | ICD-10-CM | POA: Diagnosis not present

## 2022-06-07 DIAGNOSIS — M1990 Unspecified osteoarthritis, unspecified site: Secondary | ICD-10-CM | POA: Diagnosis not present

## 2022-06-07 DIAGNOSIS — I129 Hypertensive chronic kidney disease with stage 1 through stage 4 chronic kidney disease, or unspecified chronic kidney disease: Secondary | ICD-10-CM | POA: Diagnosis not present

## 2022-06-07 DIAGNOSIS — Z7901 Long term (current) use of anticoagulants: Secondary | ICD-10-CM | POA: Diagnosis not present

## 2022-06-07 DIAGNOSIS — F419 Anxiety disorder, unspecified: Secondary | ICD-10-CM | POA: Diagnosis not present

## 2022-06-07 DIAGNOSIS — I4892 Unspecified atrial flutter: Secondary | ICD-10-CM | POA: Diagnosis not present

## 2022-06-07 DIAGNOSIS — Z7984 Long term (current) use of oral hypoglycemic drugs: Secondary | ICD-10-CM | POA: Diagnosis not present

## 2022-06-07 DIAGNOSIS — I480 Paroxysmal atrial fibrillation: Secondary | ICD-10-CM | POA: Diagnosis not present

## 2022-06-07 DIAGNOSIS — G4733 Obstructive sleep apnea (adult) (pediatric): Secondary | ICD-10-CM | POA: Diagnosis not present

## 2022-06-07 DIAGNOSIS — I447 Left bundle-branch block, unspecified: Secondary | ICD-10-CM | POA: Diagnosis not present

## 2022-06-07 DIAGNOSIS — I059 Rheumatic mitral valve disease, unspecified: Secondary | ICD-10-CM | POA: Diagnosis not present

## 2022-06-08 ENCOUNTER — Emergency Department (HOSPITAL_COMMUNITY): Payer: Medicare HMO

## 2022-06-08 ENCOUNTER — Encounter (HOSPITAL_COMMUNITY): Payer: Self-pay | Admitting: *Deleted

## 2022-06-08 ENCOUNTER — Emergency Department (HOSPITAL_COMMUNITY)
Admission: EM | Admit: 2022-06-08 | Discharge: 2022-06-09 | Disposition: A | Discharge status: Home / Self Care | Payer: Medicare HMO | Attending: Emergency Medicine | Admitting: Emergency Medicine

## 2022-06-08 ENCOUNTER — Other Ambulatory Visit: Payer: Self-pay

## 2022-06-08 DIAGNOSIS — I129 Hypertensive chronic kidney disease with stage 1 through stage 4 chronic kidney disease, or unspecified chronic kidney disease: Secondary | ICD-10-CM | POA: Diagnosis not present

## 2022-06-08 DIAGNOSIS — Z79899 Other long term (current) drug therapy: Secondary | ICD-10-CM | POA: Insufficient documentation

## 2022-06-08 DIAGNOSIS — E860 Dehydration: Secondary | ICD-10-CM | POA: Insufficient documentation

## 2022-06-08 DIAGNOSIS — E1122 Type 2 diabetes mellitus with diabetic chronic kidney disease: Secondary | ICD-10-CM | POA: Diagnosis not present

## 2022-06-08 DIAGNOSIS — J449 Chronic obstructive pulmonary disease, unspecified: Secondary | ICD-10-CM | POA: Diagnosis not present

## 2022-06-08 DIAGNOSIS — S0990XA Unspecified injury of head, initial encounter: Secondary | ICD-10-CM | POA: Insufficient documentation

## 2022-06-08 DIAGNOSIS — R911 Solitary pulmonary nodule: Secondary | ICD-10-CM | POA: Insufficient documentation

## 2022-06-08 DIAGNOSIS — G319 Degenerative disease of nervous system, unspecified: Secondary | ICD-10-CM | POA: Diagnosis not present

## 2022-06-08 DIAGNOSIS — Z794 Long term (current) use of insulin: Secondary | ICD-10-CM | POA: Diagnosis not present

## 2022-06-08 DIAGNOSIS — W19XXXA Unspecified fall, initial encounter: Secondary | ICD-10-CM | POA: Insufficient documentation

## 2022-06-08 DIAGNOSIS — Z043 Encounter for examination and observation following other accident: Secondary | ICD-10-CM | POA: Diagnosis not present

## 2022-06-08 DIAGNOSIS — Z7901 Long term (current) use of anticoagulants: Secondary | ICD-10-CM | POA: Insufficient documentation

## 2022-06-08 DIAGNOSIS — M25522 Pain in left elbow: Secondary | ICD-10-CM | POA: Insufficient documentation

## 2022-06-08 DIAGNOSIS — N1831 Chronic kidney disease, stage 3a: Secondary | ICD-10-CM | POA: Diagnosis not present

## 2022-06-08 DIAGNOSIS — R55 Syncope and collapse: Secondary | ICD-10-CM | POA: Insufficient documentation

## 2022-06-08 LAB — CBC WITH DIFFERENTIAL/PLATELET
Abs Immature Granulocytes: 0.08 10*3/uL — ABNORMAL HIGH (ref 0.00–0.07)
Basophils Absolute: 0.1 10*3/uL (ref 0.0–0.1)
Basophils Relative: 1 %
Eosinophils Absolute: 0.1 10*3/uL (ref 0.0–0.5)
Eosinophils Relative: 1 %
HCT: 42.3 % (ref 36.0–46.0)
Hemoglobin: 13.5 g/dL (ref 12.0–15.0)
Immature Granulocytes: 1 %
Lymphocytes Relative: 10 %
Lymphs Abs: 1.5 10*3/uL (ref 0.7–4.0)
MCH: 25.7 pg — ABNORMAL LOW (ref 26.0–34.0)
MCHC: 31.9 g/dL (ref 30.0–36.0)
MCV: 80.6 fL (ref 80.0–100.0)
Monocytes Absolute: 1.2 10*3/uL — ABNORMAL HIGH (ref 0.1–1.0)
Monocytes Relative: 8 %
Neutro Abs: 11.7 10*3/uL — ABNORMAL HIGH (ref 1.7–7.7)
Neutrophils Relative %: 79 %
Platelets: 252 10*3/uL (ref 150–400)
RBC: 5.25 MIL/uL — ABNORMAL HIGH (ref 3.87–5.11)
RDW: 16.6 % — ABNORMAL HIGH (ref 11.5–15.5)
WBC: 14.6 10*3/uL — ABNORMAL HIGH (ref 4.0–10.5)
nRBC: 0 % (ref 0.0–0.2)

## 2022-06-08 LAB — PROTIME-INR
INR: 1.3 — ABNORMAL HIGH (ref 0.8–1.2)
Prothrombin Time: 16.1 seconds — ABNORMAL HIGH (ref 11.4–15.2)

## 2022-06-08 LAB — BASIC METABOLIC PANEL
Anion gap: 13 (ref 5–15)
BUN: 26 mg/dL — ABNORMAL HIGH (ref 8–23)
CO2: 24 mmol/L (ref 22–32)
Calcium: 9.5 mg/dL (ref 8.9–10.3)
Chloride: 97 mmol/L — ABNORMAL LOW (ref 98–111)
Creatinine, Ser: 1.5 mg/dL — ABNORMAL HIGH (ref 0.44–1.00)
GFR, Estimated: 34 mL/min — ABNORMAL LOW (ref >60)
Glucose, Bld: 213 mg/dL — ABNORMAL HIGH (ref 70–99)
Potassium: 3.4 mmol/L — ABNORMAL LOW (ref 3.5–5.1)
Sodium: 134 mmol/L — ABNORMAL LOW (ref 135–145)

## 2022-06-08 LAB — APTT: aPTT: 33 seconds (ref 24–36)

## 2022-06-08 LAB — URINALYSIS, ROUTINE W REFLEX MICROSCOPIC
Bacteria, UA: NONE SEEN
Bilirubin Urine: NEGATIVE
Glucose, UA: >=500 mg/dL — AB
Hgb urine dipstick: NEGATIVE
Ketones, ur: NEGATIVE mg/dL
Leukocytes,Ua: NEGATIVE
Nitrite: NEGATIVE
Protein, ur: NEGATIVE mg/dL
Specific Gravity, Urine: 1.017 (ref 1.005–1.030)
pH: 6 (ref 5.0–8.0)

## 2022-06-08 LAB — TROPONIN I (HIGH SENSITIVITY): Troponin I (High Sensitivity): 12 ng/L (ref <18)

## 2022-06-08 MED ORDER — SODIUM CHLORIDE 0.9 % IV BOLUS (SEPSIS)
500.0000 mL | Freq: Once | INTRAVENOUS | Status: AC
  Administered 2022-06-08: 500 mL via INTRAVENOUS

## 2022-06-08 MED ORDER — POTASSIUM CHLORIDE CRYS ER 20 MEQ PO TBCR: 20.0000 meq | EXTENDED_RELEASE_TABLET | Freq: Every day | ORAL | 0 refills | Status: DC

## 2022-06-08 MED ORDER — POTASSIUM CHLORIDE 20 MEQ PO PACK
40.0000 meq | PACK | Freq: Once | ORAL | Status: AC
  Administered 2022-06-08: 40 meq via ORAL
  Filled 2022-06-08: qty 2

## 2022-06-08 NOTE — ED Triage Notes (Signed)
Pt from home for fall, reports having dizzy spells at home. Recent admission for TIAs. Pt hit her head on the floor. Pt is on Eliquis

## 2022-06-08 NOTE — ED Provider Notes (Signed)
[place] EMERGENCY DEPARTMENT AT [facility] Provider Note   CSN: [id number] Arrival date & time: 06/08/22  2033     History  Chief Complaint  Patient presents with   Emily Abbott    Emily Abbott is a 82 y.o. female.with pmh atrial fibrillation on Eliquis, CKD stage IIIa, COPD, OSA, diabetes mellitus type 2, HTN, recent admit for TIA presenting from home for lightheadedness and syncope  with head injury.  Patient was at home when she was standing up to make something in the kitchen when she felt lightheaded and the next thing she remembered she was on the ground.  She hit the left side of her head and her left elbow but is not complaining of any pain elsewhere.  She had no loss of bladder or bowels.  She called her daughter as she had difficulty standing up and she came over to help pick her up and then take her to the ER.  She has had no vomiting, no confusion, no fevers, no chills, no vomiting, no chest pain, no shortness of breath, no palpitations or other complaints.  Recently admitted for TIA workup.   Fall       Home Medications Prior to Admission medications   Medication Sig Start Date End Date Taking? Authorizing Provider  acetaminophen (TYLENOL) 325 MG tablet Take 2 tablets (650 mg total) by mouth every 6 (six) hours as needed for mild pain or moderate pain. 06/18/17   Berna Bue, MD  albuterol [facility] HFA) [phone] Base) MCG/ACT inhaler Inhale 1-2 puffs into the lungs every 6 (six) hours as needed for wheezing or shortness of breath. 10/10/21   Olalere, Adewale A, MD  ALPRAZolam (XANAX) 0.5 MG tablet Take 0.5 mg by mouth 2 (two) times daily as needed for anxiety or sleep.     [provider]  apixaban (ELIQUIS) 5 MG TABS tablet Take 1 tablet (5 mg total) by mouth 2 (two) times daily. 05/13/22   Marguerita Merles Latif, DO  Azelastine HCl 137 MCG/SPRAY SOLN Place 2 sprays into both nostrils at bedtime.    [provider]  calcium carbonate (TUMS EX) 750  MG chewable tablet Chew 2 tablets by mouth as needed for heartburn.    [provider]  carvedilol (COREG) 6.25 MG tablet Take 6.25 mg by mouth 2 (two) times daily with a meal.    [provider]  cholecalciferol (VITAMIN D3) 25 MCG (1000 UNIT) tablet Take 1,000 Units by mouth daily.    [provider]  diphenhydrAMINE (BENADRYL) 25 MG tablet Take 12.5 mg by mouth daily as needed for itching.    [provider]  ezetimibe (ZETIA) 10 MG tablet Take 10 mg by mouth daily.    [provider]  Fluticasone-Umeclidin-Vilant (TRELEGY ELLIPTA) 200-62.5-25 MCG/ACT AEPB Inhale 1 puff into the lungs daily. 06/21/21   Tomma Lightning, MD  furosemide (LASIX) 40 MG tablet Take one tablet in the AM and one tablet after lunch (early afternoon). 08/29/21   Marinus Maw, MD  HYDROcodone-acetaminophen (NORCO/VICODIN) 5-325 MG tablet Take 0.5-1 tablets by mouth at bedtime.    [provider]  Insulin Glargine (BASAGLAR KWIKPEN) 100 UNIT/ML Inject 14 Units into the skin daily. 07/15/19   [provider]  ipratropium-albuterol (DUONEB) 0.5-2.5 (3) MG/3ML SOLN Take 3 mLs by nebulization every 6 (six) hours as needed. 02/16/18   Leatha Gilding, MD  linagliptin (TRADJENTA) 5 MG TABS tablet Take 5 mg by mouth daily.  [provider]  magnesium gluconate (MAGONATE) 500 MG tablet Take 500 mg by mouth daily as needed (for leg cramps).     [provider]  omeprazole (PRILOSEC) 40 MG capsule Take 40 mg by mouth daily.      [provider]  OXYGEN Inhale 1.5 L into the lungs at bedtime.     [provider]  PARoxetine (PAXIL) 30 MG tablet Take 30 mg by mouth daily.      [provider]  tiZANidine (ZANAFLEX) 2 MG tablet Take 2 mg by mouth at bedtime as needed for muscle spasms.     [provider]      Allergies    Metformin and related, Adhesive [tape], Atorvastatin, and Sulfonamide derivatives    Review  of Systems   Review of Systems  Physical Exam Updated Vital Signs BP (!) 142/76   Pulse 74   Temp 97.9 F (36.6 C)   Resp 16   Ht 5\' 6"  (1.676 m)   Wt 82.6 kg   SpO2 100%   BMI 29.38 kg/m  Physical Exam Constitutional: Alert and oriented. Well appearing and in no distress. Eyes: Conjunctivae are normal. ENT      Head: Normocephalic and atraumatic.      Neck/spine: No stridor.  No midline tenderness to palpation step-offs or deformities Cardiovascular: S1, S2,  Normal and symmetric distal pulses are present in all extremities.Warm and well perfused. Respiratory: Normal respiratory effort. Breath sounds are normal.  O2 sat 100 on RA Gastrointestinal: Soft and nontender.  Musculoskeletal: Normal range of motion in all extremities.  Mild left elbow tenderness to palpation without deformity, ecchymoses or hematoma. Neurologic: Normal speech and language.  CN II through XII grossly intact.  5 out of 5 strength bilateral upper and lower extremities.  Steady gait .  Normal finger-nose.  No gross focal neurologic deficits are appreciated. Skin: Skin is warm, dry a Psychiatric: Mood and affect are normal. Speech and behavior are normal.  ED Results / Procedures / Treatments   Labs (all labs ordered are listed, but only abnormal results are displayed) Labs Reviewed  BASIC METABOLIC PANEL - Abnormal; Notable for the following components:      Result Value   Sodium 134 (*)    Potassium 3.4 (*)    Chloride 97 (*)    Glucose, Bld 213 (*)    BUN 26 (*)    Creatinine, Ser 1.50 (*)    GFR, Estimated 34 (*)    All other components within normal limits  CBC WITH DIFFERENTIAL/PLATELET - Abnormal; Notable for the following components:   WBC 14.6 (*)    RBC 5.25 (*)    MCH 25.7 (*)    RDW 16.6 (*)    Neutro Abs 11.7 (*)    Monocytes Absolute 1.2 (*)    Abs Immature Granulocytes 0.08 (*)    All other components within normal limits  PROTIME-INR - Abnormal; Notable for the following  components:   Prothrombin Time 16.1 (*)    INR 1.3 (*)    All other components within normal limits  APTT  URINALYSIS, ROUTINE W REFLEX MICROSCOPIC  TROPONIN I (HIGH SENSITIVITY)    EKG EKG Interpretation  Date/Time:  Friday June 08 2022 22:45:23 EDT Ventricular Rate:  77 PR Interval:  165 QRS Duration: 162 QT Interval:  448 QTC Calculation: 508 R Axis:   0 Text Interpretation: Sinus rhythm Atrial premature complex Left bundle branch block now sinus rhythm no sgarbossas, known LBBB Confirmed  by Vivien Rossetti ([id number]) on 06/08/2022 10:48:41 PM  Radiology CT Head Wo Contrast  Result Date: 06/08/2022 CLINICAL DATA:  Fall while on anti coagulation EXAM: CT HEAD WITHOUT CONTRAST CT CERVICAL SPINE WITHOUT CONTRAST TECHNIQUE: Multidetector CT imaging of the head and cervical spine was performed following the standard protocol without intravenous contrast. Multiplanar CT image reconstructions of the cervical spine were also generated. RADIATION DOSE REDUCTION: This exam was performed according to the departmental dose-optimization program which includes automated exposure control, adjustment of the mA and/or kV according to patient size and/or use of iterative reconstruction technique. COMPARISON:  None Available. FINDINGS: CT HEAD FINDINGS Brain: There is no mass, hemorrhage or extra-axial collection. There is generalized atrophy without lobar predilection. There is hypoattenuation of the periventricular white matter, most commonly indicating chronic ischemic microangiopathy. Vascular: Atherosclerotic calcification of the internal carotid arteries at the skull base. No abnormal hyperdensity of the major intracranial arteries or dural venous sinuses. Skull: The visualized skull base, calvarium and extracranial soft tissues are normal. Sinuses/Orbits: No fluid levels or advanced mucosal thickening of the visualized paranasal sinuses. No mastoid or middle ear effusion. The orbits are normal. CT  CERVICAL SPINE FINDINGS Alignment: No static subluxation. Facets are aligned. Occipital condyles are normally positioned. Skull base and vertebrae: No acute fracture. Soft tissues and spinal canal: No prevertebral fluid or swelling. No visible canal hematoma. Disc levels: There is multilevel degenerative disc disease and facet arthrosis with neural foraminal stenosis that is worst at right C6 and left C7. Upper chest: 6 mm ground-glass nodule in the left upper lobe (series 10, image 26). Other: Normal visualized paraspinal cervical soft tissues. IMPRESSION: 1. No acute intracranial abnormality. 2. Chronic ischemic microangiopathy and generalized atrophy. 3. No acute fracture or static subluxation of the cervical spine. 4. Multilevel degenerative disc disease and facet arthrosis with neural foraminal stenosis that is worst at right C6 and left C7. 5. Ground-glass pulmonary nodule within the left upper lobe measuring 6 mm. Per Fleischner Society Guidelines, recommend a non-contrast Chest CT at 6-12 months to confirm persistence, then additional non-contrast Chest CTs every 2 years until 5 years. If nodule grows or develops solid component(s), consider resection. These guidelines do not apply to immunocompromised patients and patients with cancer. Follow up in patients with significant comorbidities as clinically warranted. For lung cancer screening, adhere to Lung-RADS guidelines. Reference: Radiology. 2017; 284(1):228-43. Electronically Signed   By: Deatra Robinson M.D.   On: 06/08/2022 22:32   CT Cervical Spine Wo Contrast  Result Date: 06/08/2022 CLINICAL DATA:  Fall while on anti coagulation EXAM: CT HEAD WITHOUT CONTRAST CT CERVICAL SPINE WITHOUT CONTRAST TECHNIQUE: Multidetector CT imaging of the head and cervical spine was performed following the standard protocol without intravenous contrast. Multiplanar CT image reconstructions of the cervical spine were also generated. RADIATION DOSE REDUCTION: This exam  was performed according to the departmental dose-optimization program which includes automated exposure control, adjustment of the mA and/or kV according to patient size and/or use of iterative reconstruction technique. COMPARISON:  None Available. FINDINGS: CT HEAD FINDINGS Brain: There is no mass, hemorrhage or extra-axial collection. There is generalized atrophy without lobar predilection. There is hypoattenuation of the periventricular white matter, most commonly indicating chronic ischemic microangiopathy. Vascular: Atherosclerotic calcification of the internal carotid arteries at the skull base. No abnormal hyperdensity of the major intracranial arteries or dural venous sinuses. Skull: The visualized skull base, calvarium and extracranial soft tissues are normal. Sinuses/Orbits: No fluid levels or advanced mucosal thickening of the visualized  paranasal sinuses. No mastoid or middle ear effusion. The orbits are normal. CT CERVICAL SPINE FINDINGS Alignment: No static subluxation. Facets are aligned. Occipital condyles are normally positioned. Skull base and vertebrae: No acute fracture. Soft tissues and spinal canal: No prevertebral fluid or swelling. No visible canal hematoma. Disc levels: There is multilevel degenerative disc disease and facet arthrosis with neural foraminal stenosis that is worst at right C6 and left C7. Upper chest: 6 mm ground-glass nodule in the left upper lobe (series 10, image 26). Other: Normal visualized paraspinal cervical soft tissues. IMPRESSION: 1. No acute intracranial abnormality. 2. Chronic ischemic microangiopathy and generalized atrophy. 3. No acute fracture or static subluxation of the cervical spine. 4. Multilevel degenerative disc disease and facet arthrosis with neural foraminal stenosis that is worst at right C6 and left C7. 5. Ground-glass pulmonary nodule within the left upper lobe measuring 6 mm. Per Fleischner Society Guidelines, recommend a non-contrast Chest CT at  6-12 months to confirm persistence, then additional non-contrast Chest CTs every 2 years until 5 years. If nodule grows or develops solid component(s), consider resection. These guidelines do not apply to immunocompromised patients and patients with cancer. Follow up in patients with significant comorbidities as clinically warranted. For lung cancer screening, adhere to Lung-RADS guidelines. Reference: Radiology. 2017; 284(1):228-43. Electronically Signed   By: Deatra Robinson M.D.   On: 06/08/2022 22:32   DG Elbow Complete Left  Result Date: 06/08/2022 CLINICAL DATA:  Fall EXAM: LEFT ELBOW - COMPLETE 3+ VIEW COMPARISON:  None Available. FINDINGS: Degenerative changes with joint space narrowing and spurring. No acute bony abnormality. Specifically, no fracture, subluxation, or dislocation. No joint effusion. IMPRESSION: No acute bony abnormality. Electronically Signed   By: Charlett Nose M.D.   On: 06/08/2022 22:31   DG Chest Portable 1 View  Result Date: 06/08/2022 CLINICAL DATA:  Fall EXAM: PORTABLE CHEST 1 VIEW COMPARISON:  05/10/2022 FINDINGS: Left pacer remains in place, unchanged. Prior median sternotomy and valve replacement. Heart is normal size. No confluent airspace opacities, effusions or edema. No acute bony abnormality. IMPRESSION: No active disease. Electronically Signed   By: Charlett Nose M.D.   On: 06/08/2022 22:30    Procedures Procedures    Medications Ordered in ED Medications  sodium chloride 0.9 % bolus 500 mL (500 mLs Intravenous New Bag/Given 06/08/22 2310)  potassium chloride (KLOR-CON) packet 40 mEq (40 mEq Oral Given 06/08/22 2308)    ED Course/ Medical Decision Making/ A&P                             Medical Decision Making  Emily Abbott is a 82 y.o. female.with pmh atrial fibrillation on Eliquis, CKD stage IIIa, COPD, OSA, diabetes mellitus type 2, HTN, recent admit for TIA presenting from home for lightheadedness and syncope  with head injury.   Patient only  reported injuries include left-sided parietal head pain and left elbow pain.  CT head and C-spine obtained which I personally reviewed no ICH or acute traumatic injury.  Left elbow with no injury.  Chest x-ray no obvious pneumothorax or pneumonia.  Suspect patient had likely vasovagal versus orthostatic syncope today.  Creatinine slightly elevated from baseline at 1.5 with elevated BUN 26 mild hypokalemia 3.4 mild hyponatremia 134 mild hypochloremia 97.  She appears euvolemic on exam.  Given 500 cc IV fluids.  She also has a notable mild leukocytosis 14.6 no anemia hemoglobin 13.5.  Will obtain urine to ensure no  evidence of UTI.  I am not concerned for sepsis.  EKG with known left bundle branch block.  No Sgarbossa criteria no acute changes.  High sensitive troponin 12 reassuring with no cardiopulmonary complaints, doubt cardiogenic syncope.  S/o to Dr Clayborne Dana pending urine rpt trop. Likely dc home with outpatient PCP/cardiologist follow up.   Amount and/or Complexity of Data Reviewed Labs: ordered. Radiology: ordered.  Risk Prescription drug management.      Final Clinical Impression(s) / ED Diagnoses Final diagnoses:  Syncope, unspecified syncope type  Closed head injury, initial encounter  Pulmonary nodule  Dehydration    Rx / DC Orders ED Discharge Orders     None         Mardene Sayer, MD 06/08/22 2327

## 2022-06-08 NOTE — Progress Notes (Signed)
Trauma Response Nurse Documentation   Emily Abbott is a 82 y.o. female arriving to [facility] ED via POV  On Eliquis (apixaban) daily. Trauma was activated as a Level 2 by [place] RN, based on the following trauma criteria Elderly patients > 65 with head trauma on anti-coagulation (excluding ASA). Trauma team at the bedside on patient arrival.   Patient cleared for CT by Dr. Elpidio Anis. Pt transported to CT with trauma response nurse present to monitor. RN remained with the patient throughout their absence from the department for clinical observation.   GCS 15.  Delay to trauma activation due to triage time. Triage nurse was not informed of fall on thinners checking in.  History   Past Medical History:  Diagnosis Date   Anemia    Anxiety    CAD (coronary artery disease)    Chronic kidney disease    CKD stage 3    COPD (chronic obstructive pulmonary disease)    uses  1 to and1.5 L of o2 at bedtime ; has not seen her pulm doctor in over a year ; has not had to uses inhlaer in a over a year     Diabetes mellitus    type II   GERD (gastroesophageal reflux disease)    HTN (hypertension)    essential   Hyperlipidemia, mixed    Mitral valve disorder    Osteoarthritis    Panic attack    Paroxysmal atrial fibrillation    Pneumonia    2010   Pulmonary embolism 2010   Sleep apnea    does not uses cpap since she got a URI from it. has home 02 as needed     Past Surgical History:  Procedure Laterality Date   BIOPSY BREAST     BIV ICD GENERATOR CHANGEOUT N/A 06/06/2017   Procedure: BIV ICD GENERATOR CHANGEOUT;  Surgeon: Marinus Maw, MD;  Location: [facility] INVASIVE CV LAB;  Service: Cardiovascular;  Laterality: N/A;   CARDIAC VALVE SURGERY     mitral   carpal tennel  2003   EYE SURGERY  2015  or 2016   cataract surgery    PARATHYROIDECTOMY Left 06/17/2017   Procedure: LEFT UPPER PARATHYROIDECTOMY;  Surgeon: Berna Bue, MD;  Location: [facility] ORS;  Service: General;   Laterality: Left;   TUBAL LIGATION      over 60 years ago        Initial Focused Assessment (If applicable, or please see trauma documentation):  Pt arrived POV as fall on thinners with daughter. Per pt she was standing in the kitchen, does not remember falling. Has swelling to posterior L side of head. Pt reports rec TIA, placed on eliquis and has had "dizzy spells".  Airway - intact Breathing - bilateral breath sounds clear, equal expansion Circulation - 3+ distal pulses, no bleeding noted. Disability - GCS 15  Pt brought to room 35 from triage by TRN. 20g PIV established in L AC, vitals obtained. Pt reports headache.  CT's Completed:   CT Head and CT C-Spine   Interventions:  Xray chest Xray L elbow  Plan for disposition:  {Trauma Dispo:26867}   Consults completed:  {Trauma Consults:26862} at ***.  Event Summary:  MTP Summary (If applicable):   Bedside handoff with ED RN Helmut Muster RN.    Dshaun Reppucci E Leshae Mcclay  Trauma Response RN  Please call TRN at [phone] for further assistance.

## 2022-06-08 NOTE — Discharge Instructions (Addendum)
You have been seen today in the Emergency Department (ED)  for syncope (passing out).  Your workup including labs and EKG show reassuring results.  Your symptoms may be due to dehydration, so it is important that you drink plenty of non-alcoholic fluids. It is also important to avoid drugs and alcohol.  Your scan did show evidence of a pulmonary nodule.  This will need follow-up with your primary care doctor and a repeat CT scan of the chest in approximately 6 months.  Please call your regular doctor as soon as possible to schedule the next available clinic appointment to follow up with him/her regarding your visit to the ED and your symptoms. You should ideally follow up with your primary doctor within one week.  Return to the Emergency Department (ED)  if you have any further syncopal episodes (pass out again) or develop ANY chest pain, pressure, tightness, palpitations, trouble breathing, sudden sweating, or other symptoms that concern you.

## 2022-06-08 NOTE — ED Provider Notes (Signed)
11:25 PM Assumed care from Dr. Elpidio Anis, please see their note for full history, physical and decision making until this point. In brief this is a 82 y.o. year old female who presented to the ED tonight with Fall     Weakness/near syncopal/syncopal fall on thinners. Baseline ECG. Slightly dry, getting fluids. Scans negative. Neuro baseline, has ambulated. Pending trop/UA and appropriate disposition.   Trop good. UA clean. Ambulating. Stable for d/c.  Discharge instructions, including strict return precautions for new or worsening symptoms, given. Patient and/or family verbalized understanding and agreement with the plan as described.   Labs, studies and imaging reviewed by myself and considered in medical decision making if ordered. Imaging interpreted by radiology.  Labs Reviewed  BASIC METABOLIC PANEL - Abnormal; Notable for the following components:      Result Value   Sodium 134 (*)    Potassium 3.4 (*)    Chloride 97 (*)    Glucose, Bld 213 (*)    BUN 26 (*)    Creatinine, Ser 1.50 (*)    GFR, Estimated 34 (*)    All other components within normal limits  CBC WITH DIFFERENTIAL/PLATELET - Abnormal; Notable for the following components:   WBC 14.6 (*)    RBC 5.25 (*)    MCH 25.7 (*)    RDW 16.6 (*)    Neutro Abs 11.7 (*)    Monocytes Absolute 1.2 (*)    Abs Immature Granulocytes 0.08 (*)    All other components within normal limits  PROTIME-INR - Abnormal; Notable for the following components:   Prothrombin Time 16.1 (*)    INR 1.3 (*)    All other components within normal limits  APTT  URINALYSIS, ROUTINE W REFLEX MICROSCOPIC  TROPONIN I (HIGH SENSITIVITY)    DG Elbow Complete Left  Final Result    DG Chest Portable 1 View  Final Result    CT Head Wo Contrast  Final Result    CT Cervical Spine Wo Contrast  Final Result      No follow-ups on file.    Marily Memos, MD 06/09/22 [phone]

## 2022-06-09 LAB — TROPONIN I (HIGH SENSITIVITY): Troponin I (High Sensitivity): 9 ng/L (ref <18)

## 2022-06-18 DIAGNOSIS — K219 Gastro-esophageal reflux disease without esophagitis: Secondary | ICD-10-CM | POA: Diagnosis not present

## 2022-06-18 DIAGNOSIS — I10 Essential (primary) hypertension: Secondary | ICD-10-CM | POA: Diagnosis not present

## 2022-06-18 DIAGNOSIS — E1121 Type 2 diabetes mellitus with diabetic nephropathy: Secondary | ICD-10-CM | POA: Diagnosis not present

## 2022-06-18 DIAGNOSIS — E785 Hyperlipidemia, unspecified: Secondary | ICD-10-CM | POA: Diagnosis not present

## 2022-06-19 ENCOUNTER — Telehealth: Payer: Self-pay | Admitting: Internal Medicine

## 2022-06-19 NOTE — Telephone Encounter (Signed)
Pt called back per message received in [facility] Triage.    Pt did not answer my call; Voicemail message was left, for Pt to call back to discuss Eliquis concern.  Follow up is still required.

## 2022-06-19 NOTE — Telephone Encounter (Signed)
Pt c/o medication issue:  1. Name of Medication:  apixaban (ELIQUIS) 5 MG TABS tablet  2. How are you currently taking this medication (dosage and times per day)?  3. Are you having a reaction (difficulty breathing--STAT)?   4. What is your medication issue?   Per Florentina Addison, last night patient had a nose bleed that lasted more than a few minutes. Patient's nose was gushing blood and she called 911. Katie states patient is on her second bottle of Eliquis and she would like to know if Dr. Ladona Ridgel has any recommendations.

## 2022-06-22 NOTE — Telephone Encounter (Signed)
Pt called back X 2- per message received in [facility] Triage  Pt did not answer my call X 2.  Voicemail message left, to address concern / Eliquis question.  Follow up is still required.  [facility] telephone number left in voicemail message.

## 2022-06-22 NOTE — Telephone Encounter (Signed)
Pt called X 3 for follow up on Eliquis concern / message received in [facility] Triage.  Pt did not answer call voicemail message left.  06/22/2022

## 2022-06-25 DIAGNOSIS — J438 Other emphysema: Secondary | ICD-10-CM | POA: Diagnosis not present

## 2022-06-26 DIAGNOSIS — J9611 Chronic respiratory failure with hypoxia: Secondary | ICD-10-CM | POA: Diagnosis not present

## 2022-06-26 DIAGNOSIS — I10 Essential (primary) hypertension: Secondary | ICD-10-CM | POA: Diagnosis not present

## 2022-06-26 DIAGNOSIS — I110 Hypertensive heart disease with heart failure: Secondary | ICD-10-CM | POA: Diagnosis not present

## 2022-06-26 DIAGNOSIS — R040 Epistaxis: Secondary | ICD-10-CM | POA: Diagnosis not present

## 2022-06-26 DIAGNOSIS — D6869 Other thrombophilia: Secondary | ICD-10-CM | POA: Diagnosis not present

## 2022-06-26 DIAGNOSIS — Z8673 Personal history of transient ischemic attack (TIA), and cerebral infarction without residual deficits: Secondary | ICD-10-CM | POA: Diagnosis not present

## 2022-06-26 DIAGNOSIS — I5022 Chronic systolic (congestive) heart failure: Secondary | ICD-10-CM | POA: Diagnosis not present

## 2022-06-26 DIAGNOSIS — I480 Paroxysmal atrial fibrillation: Secondary | ICD-10-CM | POA: Diagnosis not present

## 2022-06-26 DIAGNOSIS — I951 Orthostatic hypotension: Secondary | ICD-10-CM | POA: Diagnosis not present

## 2022-06-26 NOTE — Telephone Encounter (Signed)
Called Pt x 4, unable to contact, left voicemail message.    Called Pt daughter, unable to contact, left voicemail message.

## 2022-06-27 ENCOUNTER — Telehealth: Payer: Self-pay

## 2022-06-27 NOTE — Telephone Encounter (Signed)
The patient states she was in the hospital for having mini strokes. The hospital stop the warfrin and put her on Eliquis. The eliquis is making her nose bleed. She spoke with her pcp and they states she should see Dr. Ladona Ridgel. I told her I will send a phone note to the scheduler to get her scheduled.

## 2022-06-28 DIAGNOSIS — I129 Hypertensive chronic kidney disease with stage 1 through stage 4 chronic kidney disease, or unspecified chronic kidney disease: Secondary | ICD-10-CM | POA: Diagnosis not present

## 2022-06-28 DIAGNOSIS — J449 Chronic obstructive pulmonary disease, unspecified: Secondary | ICD-10-CM | POA: Diagnosis not present

## 2022-06-28 DIAGNOSIS — K219 Gastro-esophageal reflux disease without esophagitis: Secondary | ICD-10-CM | POA: Diagnosis not present

## 2022-06-28 DIAGNOSIS — E785 Hyperlipidemia, unspecified: Secondary | ICD-10-CM | POA: Diagnosis not present

## 2022-06-28 DIAGNOSIS — E1121 Type 2 diabetes mellitus with diabetic nephropathy: Secondary | ICD-10-CM | POA: Diagnosis not present

## 2022-06-28 DIAGNOSIS — N1832 Chronic kidney disease, stage 3b: Secondary | ICD-10-CM | POA: Diagnosis not present

## 2022-06-29 ENCOUNTER — Ambulatory Visit (INDEPENDENT_AMBULATORY_CARE_PROVIDER_SITE_OTHER): Payer: Medicare HMO

## 2022-06-29 DIAGNOSIS — I428 Other cardiomyopathies: Secondary | ICD-10-CM

## 2022-06-29 LAB — CUP PACEART REMOTE DEVICE CHECK
Battery Remaining Longevity: 42 mo
Battery Remaining Percentage: 40 %
Battery Voltage: 2.98 V
Date Time Interrogation Session: 20240510040030
Implantable Lead Connection Status: 753985
Implantable Lead Connection Status: 753985
Implantable Lead Connection Status: 753985
Implantable Lead Implant Date: 20110127
Implantable Lead Implant Date: 20110127
Implantable Lead Implant Date: 20110127
Implantable Lead Location: 753858
Implantable Lead Location: 753859
Implantable Lead Location: 753860
Implantable Lead Model: 7122
Implantable Pulse Generator Implant Date: 20190418
Lead Channel Impedance Value: 600 Ohm
Lead Channel Impedance Value: 900 Ohm
Lead Channel Pacing Threshold Amplitude: 0.75 V
Lead Channel Pacing Threshold Amplitude: 1.25 V
Lead Channel Pacing Threshold Pulse Width: 0.5 ms
Lead Channel Pacing Threshold Pulse Width: 0.6 ms
Lead Channel Sensing Intrinsic Amplitude: 12 mV
Lead Channel Setting Pacing Amplitude: 2.25 V
Lead Channel Setting Pacing Amplitude: 2.5 V
Lead Channel Setting Pacing Pulse Width: 0.5 ms
Lead Channel Setting Pacing Pulse Width: 0.6 ms
Lead Channel Setting Sensing Sensitivity: 2 mV
Pulse Gen Model: 3222
Pulse Gen Serial Number: 9393517

## 2022-07-05 DIAGNOSIS — N1832 Chronic kidney disease, stage 3b: Secondary | ICD-10-CM | POA: Diagnosis not present

## 2022-07-05 DIAGNOSIS — E1122 Type 2 diabetes mellitus with diabetic chronic kidney disease: Secondary | ICD-10-CM | POA: Diagnosis not present

## 2022-07-05 DIAGNOSIS — E785 Hyperlipidemia, unspecified: Secondary | ICD-10-CM | POA: Diagnosis not present

## 2022-07-05 DIAGNOSIS — E213 Hyperparathyroidism, unspecified: Secondary | ICD-10-CM | POA: Diagnosis not present

## 2022-07-05 DIAGNOSIS — D6869 Other thrombophilia: Secondary | ICD-10-CM | POA: Diagnosis not present

## 2022-07-05 DIAGNOSIS — Z0000 Encounter for general adult medical examination without abnormal findings: Secondary | ICD-10-CM | POA: Diagnosis not present

## 2022-07-05 DIAGNOSIS — I10 Essential (primary) hypertension: Secondary | ICD-10-CM | POA: Diagnosis not present

## 2022-07-05 DIAGNOSIS — I5022 Chronic systolic (congestive) heart failure: Secondary | ICD-10-CM | POA: Diagnosis not present

## 2022-07-05 DIAGNOSIS — I480 Paroxysmal atrial fibrillation: Secondary | ICD-10-CM | POA: Diagnosis not present

## 2022-07-05 DIAGNOSIS — B001 Herpesviral vesicular dermatitis: Secondary | ICD-10-CM | POA: Diagnosis not present

## 2022-07-05 DIAGNOSIS — J9611 Chronic respiratory failure with hypoxia: Secondary | ICD-10-CM | POA: Diagnosis not present

## 2022-07-05 DIAGNOSIS — F3341 Major depressive disorder, recurrent, in partial remission: Secondary | ICD-10-CM | POA: Diagnosis not present

## 2022-07-05 DIAGNOSIS — Z794 Long term (current) use of insulin: Secondary | ICD-10-CM | POA: Diagnosis not present

## 2022-07-05 DIAGNOSIS — D72829 Elevated white blood cell count, unspecified: Secondary | ICD-10-CM | POA: Diagnosis not present

## 2022-07-05 DIAGNOSIS — E1121 Type 2 diabetes mellitus with diabetic nephropathy: Secondary | ICD-10-CM | POA: Diagnosis not present

## 2022-07-17 NOTE — Progress Notes (Signed)
Remote pacemaker transmission.   

## 2022-07-19 DIAGNOSIS — Z7901 Long term (current) use of anticoagulants: Secondary | ICD-10-CM | POA: Diagnosis not present

## 2022-07-19 DIAGNOSIS — R040 Epistaxis: Secondary | ICD-10-CM | POA: Diagnosis not present

## 2022-07-26 DIAGNOSIS — J438 Other emphysema: Secondary | ICD-10-CM | POA: Diagnosis not present

## 2022-07-28 ENCOUNTER — Inpatient Hospital Stay: Payer: Medicare HMO | Attending: Hematology and Oncology | Admitting: Hematology and Oncology

## 2022-07-28 ENCOUNTER — Inpatient Hospital Stay: Payer: Medicare HMO

## 2022-07-28 VITALS — BP 154/88 | HR 71 | Temp 97.5°F | Resp 18 | Ht 66.0 in | Wt 185.8 lb

## 2022-07-28 DIAGNOSIS — D72829 Elevated white blood cell count, unspecified: Secondary | ICD-10-CM | POA: Insufficient documentation

## 2022-07-28 DIAGNOSIS — Z86718 Personal history of other venous thrombosis and embolism: Secondary | ICD-10-CM | POA: Diagnosis not present

## 2022-07-28 DIAGNOSIS — Z79899 Other long term (current) drug therapy: Secondary | ICD-10-CM | POA: Insufficient documentation

## 2022-07-28 DIAGNOSIS — Z86711 Personal history of pulmonary embolism: Secondary | ICD-10-CM | POA: Diagnosis not present

## 2022-07-28 DIAGNOSIS — Z7901 Long term (current) use of anticoagulants: Secondary | ICD-10-CM | POA: Insufficient documentation

## 2022-07-28 LAB — CBC WITH DIFFERENTIAL/PLATELET
Abs Immature Granulocytes: 0.04 10*3/uL (ref 0.00–0.07)
Basophils Absolute: 0.1 10*3/uL (ref 0.0–0.1)
Basophils Relative: 1 %
Eosinophils Absolute: 0.2 10*3/uL (ref 0.0–0.5)
Eosinophils Relative: 2 %
HCT: 39.8 % (ref 36.0–46.0)
Hemoglobin: 12.2 g/dL (ref 12.0–15.0)
Immature Granulocytes: 0 %
Lymphocytes Relative: 15 %
Lymphs Abs: 1.8 10*3/uL (ref 0.7–4.0)
MCH: 24.9 pg — ABNORMAL LOW (ref 26.0–34.0)
MCHC: 30.7 g/dL (ref 30.0–36.0)
MCV: 81.2 fL (ref 80.0–100.0)
Monocytes Absolute: 0.8 10*3/uL (ref 0.1–1.0)
Monocytes Relative: 7 %
Neutro Abs: 9 10*3/uL — ABNORMAL HIGH (ref 1.7–7.7)
Neutrophils Relative %: 75 %
Platelets: 280 10*3/uL (ref 150–400)
RBC: 4.9 MIL/uL (ref 3.87–5.11)
RDW: 15.4 % (ref 11.5–15.5)
WBC: 11.9 10*3/uL — ABNORMAL HIGH (ref 4.0–10.5)
nRBC: 0 % (ref 0.0–0.2)

## 2022-07-28 LAB — CMP (CANCER CENTER ONLY)
ALT: 11 U/L (ref 0–44)
AST: 17 U/L (ref 15–41)
Albumin: 4.4 g/dL (ref 3.5–5.0)
Alkaline Phosphatase: 84 U/L (ref 38–126)
Anion gap: 10 (ref 5–15)
BUN: 25 mg/dL — ABNORMAL HIGH (ref 8–23)
CO2: 28 mmol/L (ref 22–32)
Calcium: 10.2 mg/dL (ref 8.9–10.3)
Chloride: 103 mmol/L (ref 98–111)
Creatinine: 1.53 mg/dL — ABNORMAL HIGH (ref 0.44–1.00)
GFR, Estimated: 34 mL/min — ABNORMAL LOW (ref >60)
Glucose, Bld: 164 mg/dL — ABNORMAL HIGH (ref 70–99)
Potassium: 4.1 mmol/L (ref 3.5–5.1)
Sodium: 141 mmol/L (ref 135–145)
Total Bilirubin: 0.6 mg/dL (ref 0.3–1.2)
Total Protein: 7.8 g/dL (ref 6.5–8.1)

## 2022-07-28 LAB — TECHNOLOGIST SMEAR REVIEW: Plt Morphology: NORMAL

## 2022-07-28 LAB — C-REACTIVE PROTEIN: CRP: 0.5 mg/dL (ref <1.0)

## 2022-07-28 LAB — SEDIMENTATION RATE: Sed Rate: 18 mm/hr (ref 0–22)

## 2022-07-28 NOTE — Progress Notes (Signed)
[facility] CONSULT NOTE  Patient Care Team: [name], MD as PCP - General (Family Medicine) Marinus Maw, MD as PCP - Cardiology (Cardiology)  CHIEF COMPLAINTS/PURPOSE OF CONSULTATION:  Leukocytosis.  ASSESSMENT & PLAN:  This is a very pleasant 82 year old female patient with past medical history significant for chronic kidney disease, diabetes, hypertension, mitral valve disorder, osteoarthritis referred to hematology for leukocytosis.  She complains of some intentional weight loss because of change in diet.  Otherwise has not noticed any change in her health complaints.  She denies any new medications except for Eliquis which may be causing her some itching she says.  She denies any recent infections, steroid use.  She has history of VTE.  Physical examination today with no major findings. We have reviewed her labs which shows mild leukocytosis predominantly neutrophilia, no polycythemia or thrombocytosis.  No basophilia noted.  We have reviewed common causes of leukocytosis including but not limited to stress, chronic pain, medication use, infections, bone marrow disorders.  She does not want to do any aggressive testing.  She says it is all in God's hands.  She is okay with doing some simple blood test but mostly would like to keep an eye on things.  We have hence I discussed about doing a CBC, smear review, inflammatory markers today.  I will try to see her back in about 6 weeks.  If she has persistent and worsening leukocytosis, we can consider some myeloproliferative workup on the blood.  She is okay with this plan.  Thank you for consulting [place] in the care of this patient.  Please do not hesitate to contact us with any additional questions or concerns.   HISTORY OF PRESENTING ILLNESS:  ADREAM PARZYCH 82 y.o. female is here because of leukocytosis.  This is a very pleasant 82 year old female patient with past medical history significant for osteoarthritis, CAD, CKD,  COPD, mitral valve disorder on chronic anticoagulation, history of pulmonary embolism referred to hematology for evaluation and recommendations regarding leukocytosis.  She arrived today to the appointment with her grandson.  She denies any major complaints which are not new.  She has chronic pain for which she takes Vicodin every night.  This is mostly from her osteoarthritis in her fingers and her knees.  She tells me that she does not really want to do any aggressive testing, she says it is all in God's hands.  She does not want to really consider any bone marrow procedures etc.  She has lost some weight but this is basically because of change in her diet.  She otherwise denies any change in breathing, bowel habits.  No new neurological complaints.  Rest of the pertinent 10 point ROS reviewed and negative.  MEDICAL HISTORY:  Past Medical History:  Diagnosis Date   Anemia    Anxiety    CAD (coronary artery disease)    Chronic kidney disease    CKD stage 3    COPD (chronic obstructive pulmonary disease) (HCC)    uses  1 to and1.5 L of o2 at bedtime ; has not seen her pulm doctor in over a year ; has not had to uses inhlaer in a over a year     Diabetes mellitus    type II   GERD (gastroesophageal reflux disease)    HTN (hypertension)    essential   Hyperlipidemia, mixed    Mitral valve disorder    Osteoarthritis    Panic attack    Paroxysmal atrial  fibrillation (HCC)    Pneumonia    2010   Pulmonary embolism (HCC) 2010   Sleep apnea    does not uses cpap since she got a URI from it. has home 02 as needed    SURGICAL HISTORY: Past Surgical History:  Procedure Laterality Date   BIOPSY BREAST     BIV ICD GENERATOR CHANGEOUT N/A 06/06/2017   Procedure: BIV ICD GENERATOR CHANGEOUT;  Surgeon: Marinus Maw, MD;  Location: [facility] INVASIVE CV LAB;  Service: Cardiovascular;  Laterality: N/A;   CARDIAC VALVE SURGERY     mitral   carpal tennel  2003   EYE SURGERY  2015  or 2016   cataract  surgery    PARATHYROIDECTOMY Left 06/17/2017   Procedure: LEFT UPPER PARATHYROIDECTOMY;  Surgeon: Berna Bue, MD;  Location: [facility] ORS;  Service: General;  Laterality: Left;   TUBAL LIGATION      over 60 years ago     SOCIAL HISTORY: Social History   Socioeconomic History   Marital status: Widowed    Spouse name: Not on file   Number of children: Not on file   Years of education: Not on file   Highest education level: Not on file  Occupational History   Occupation: reitred  Tobacco Use   Smoking status: Former    Packs/day: 1.00    Years: 50.00    Additional pack years: 0.00    Total pack years: 50.00    Types: Cigarettes    Quit date: 02/19/2001    Years since quitting: 21.4   Smokeless tobacco: Never  Vaping Use   Vaping Use: Never used  Substance and Sexual Activity   Alcohol use: No    Alcohol/week: 0.0 standard drinks of alcohol   Drug use: Never   Sexual activity: Not on file  Other Topics Concern   Not on file  Social History Narrative   Not on file   Social Determinants of Health   Financial Resource Strain: Not on file  Food Insecurity: No Food Insecurity (05/11/2022)   Hunger Vital Sign    Worried About Running Out of Food in the Last Year: Never true    Ran Out of Food in the Last Year: Never true  Transportation Needs: No Transportation Needs (05/11/2022)   PRAPARE - Administrator, Civil Service (Medical): No    Lack of Transportation (Non-Medical): No  Physical Activity: Not on file  Stress: Not on file  Social Connections: Not on file  Intimate Partner Violence: Not At Risk (05/11/2022)   Humiliation, Afraid, Rape, and Kick questionnaire    Fear of Current or Ex-Partner: No    Emotionally Abused: No    Physically Abused: No    Sexually Abused: No    FAMILY HISTORY: Family History  Problem Relation Age of Onset   Stroke Mother    Anuerysm Father    Diabetes Sister    Cancer Brother    Ulcers Sister     ALLERGIES:  is  allergic to metformin and related, adhesive [tape], atorvastatin, and sulfonamide derivatives.  MEDICATIONS:  Current Outpatient Medications  Medication Sig Dispense Refill   acetaminophen (TYLENOL) 325 MG tablet Take 2 tablets (650 mg total) by mouth every 6 (six) hours as needed for mild pain or moderate pain.     albuterol (PROAIR HFA) 108 (90 Base) MCG/ACT inhaler Inhale 1-2 puffs into the lungs every 6 (six) hours as needed for wheezing or shortness of breath. 18 g 5  ALPRAZolam (XANAX) 0.5 MG tablet Take 0.5 mg by mouth 2 (two) times daily as needed for anxiety or sleep.      apixaban (ELIQUIS) 5 MG TABS tablet Take 1 tablet (5 mg total) by mouth 2 (two) times daily. 60 tablet 0   Azelastine HCl 137 MCG/SPRAY SOLN Place 2 sprays into both nostrils at bedtime.     calcium carbonate (TUMS EX) 750 MG chewable tablet Chew 2 tablets by mouth as needed for heartburn.     carvedilol (COREG) 6.25 MG tablet Take 6.25 mg by mouth 2 (two) times daily with a meal.     cholecalciferol (VITAMIN D3) 25 MCG (1000 UNIT) tablet Take 1,000 Units by mouth daily.     diphenhydrAMINE (BENADRYL) 25 MG tablet Take 12.5 mg by mouth daily as needed for itching.     ezetimibe (ZETIA) 10 MG tablet Take 10 mg by mouth daily.     Fluticasone-Umeclidin-Vilant (TRELEGY ELLIPTA) 200-62.5-25 MCG/ACT AEPB Inhale 1 puff into the lungs daily. 60 each 6   furosemide (LASIX) 40 MG tablet Take one tablet in the AM and one tablet after lunch (early afternoon). 180 tablet 3   HYDROcodone-acetaminophen (NORCO/VICODIN) 5-325 MG tablet Take 0.5-1 tablets by mouth at bedtime.     Insulin Glargine (BASAGLAR KWIKPEN) 100 UNIT/ML Inject 14 Units into the skin daily.     ipratropium-albuterol (DUONEB) 0.5-2.5 (3) MG/3ML SOLN Take 3 mLs by nebulization every 6 (six) hours as needed. 360 mL 2   linagliptin (TRADJENTA) 5 MG TABS tablet Take 5 mg by mouth daily.     magnesium gluconate (MAGONATE) 500 MG tablet Take 500 mg by mouth daily as  needed (for leg cramps).      omeprazole (PRILOSEC) 40 MG capsule Take 40 mg by mouth daily.       OXYGEN Inhale 1.5 L into the lungs at bedtime.      PARoxetine (PAXIL) 30 MG tablet Take 30 mg by mouth daily.       potassium chloride SA (KLOR-CON M) 20 MEQ tablet Take 1 tablet (20 mEq total) by mouth daily for 3 days. 3 tablet 0   tiZANidine (ZANAFLEX) 2 MG tablet Take 2 mg by mouth at bedtime as needed for muscle spasms.      No current facility-administered medications for this visit.     PHYSICAL EXAMINATION: ECOG PERFORMANCE STATUS: 2 - Symptomatic, <50% confined to bed  Vitals:   07/28/22 1138  BP: (!) 154/88  Pulse: 71  Resp: 18  Temp: (!) 97.5 F (36.4 C)  SpO2: 97%   Filed Weights   07/28/22 1138  Weight: 185 lb 12.8 oz (84.3 kg)    GENERAL:alert, no distress and comfortable SKIN: skin color with some pallor, no icterus  NECK: supple, thyroid normal size, non-tender, without nodularity.  No palpable adenopathy LUNGS: clear to auscultation and percussion with normal breathing effort HEART: regular rate & rhythm and no murmurs, no bilateral lower extremity edema ABDOMEN:abdomen soft, non-tender and normal bowel sounds, no overt hepatosplenomegaly Musculoskeletal:no cyanosis of digits and no clubbing  PSYCH: alert & oriented x 3 with fluent speech NEURO: no focal motor/sensory deficits  LABORATORY DATA:  I have reviewed the data as listed Lab Results  Component Value Date   WBC 14.6 (H) 06/08/2022   HGB 13.5 06/08/2022   HCT 42.3 06/08/2022   MCV 80.6 06/08/2022   PLT 252 06/08/2022     Chemistry      Component Value Date/Time   NA 134 (L) 06/08/2022  2200   NA 140 05/31/2017 1448   K 3.4 (L) 06/08/2022 2200   CL 97 (L) 06/08/2022 2200   CO2 24 06/08/2022 2200   BUN 26 (H) 06/08/2022 2200   BUN 13 05/31/2017 1448   CREATININE 1.50 (H) 06/08/2022 2200      Component Value Date/Time   CALCIUM 9.5 06/08/2022 2200   ALKPHOS 68 05/12/2022 0252   AST 14  (L) 05/12/2022 0252   ALT 13 05/12/2022 0252   BILITOT 0.7 05/12/2022 0252       RADIOGRAPHIC STUDIES: I have personally reviewed the radiological images as listed and agreed with the findings in the report. CUP PACEART REMOTE DEVICE CHECK  Result Date: 06/29/2022 Scheduled remote reviewed. Normal device function.  BiV pacing 63%, consistent with trends Known AF, Eliquis per EPIC Next remote 91 days. LA, CVRS   All questions were answered. The patient knows to call the clinic with any problems, questions or concerns. I spent 45 minutes in the care of this patient including H and P, review of records, counseling and coordination of care.     Rachel Moulds, MD 07/28/2022 11:43 AM

## 2022-08-01 LAB — ANTINUCLEAR ANTIBODIES, IFA: ANA Ab, IFA: NEGATIVE

## 2022-08-04 NOTE — Progress Notes (Unsigned)
Cardiology Office Note Date:  08/04/2022  Patient ID:  Emily Abbott, Emily Abbott [birth year], MRN [id number] PCP:  [name], MD  Electrophysiologist: Emily Abbott   Chief Complaint:  *** post hospital  History of Present Illness: Emily Abbott is a 82 y.o. female with history of no known CAD, Notes mention history of normal coronaries by cath in 2010, no further cath procedures that I find, CM, LBBB, w/ICD, chronic CHF (systolic w/recovered LVEF by last echos), HTN, HLD, DM, COPD with QHS and PRN home O2, uses infrequently other then at HS,  Hx of PE and PAFib on warfarin.  She also has hx of MV repair (28mm Physio ring annuloplasty) done in 2010, with LAA ligation and MAZE inter-op.  She saw Emily Abbott 08/29/21, doing well, minimal palpitations, some edema, mild DOE. Planned to transition from warfarin > Eliquis, no other changes   Subsequent calls regarding eliquis and warfarin, appears she never made the transition, unclear why  Pt called May with reports of nose bleeds though staff unable to reach her in return calls   Admitted 05/09/21 treated for TIA, INR subtherapeutic and warfarin changed to Eliquis Discharged 05/12/22  LVEF during the stay noted 40-45%, down from her prior 60-65% Mild RVEF reduction MVRepair functioning well  *** need to revisit LVEF, ? Ischemic eval? GDMT *** rates *** eliquis, bleeding, ?? Nose, dose, labs   Device information: SJM CRT-D, implanted 03/17/09 > gen change downgrade to CRT-P April 2019  AFib/AAD Hx Tikosyn >> stopped with progression to persistent AFib 2019  Past Medical History:  Diagnosis Date   Anemia    Anxiety    CAD (coronary artery disease)    Chronic kidney disease    CKD stage 3    COPD (chronic obstructive pulmonary disease) (HCC)    uses  1 to and1.5 L of o2 at bedtime ; has not seen her pulm doctor in over a year ; has not had to uses inhlaer in a over a year     Diabetes mellitus    type II   GERD (gastroesophageal  reflux disease)    HTN (hypertension)    essential   Hyperlipidemia, mixed    Mitral valve disorder    Osteoarthritis    Panic attack    Paroxysmal atrial fibrillation (HCC)    Pneumonia    2010   Pulmonary embolism (HCC) 2010   Sleep apnea    does not uses cpap since she got a URI from it. has home 02 as needed    Past Surgical History:  Procedure Laterality Date   BIOPSY BREAST     BIV ICD GENERATOR CHANGEOUT N/A 06/06/2017   Procedure: BIV ICD GENERATOR CHANGEOUT;  Surgeon: Marinus Maw, MD;  Location: [facility] INVASIVE CV LAB;  Service: Cardiovascular;  Laterality: N/A;   CARDIAC VALVE SURGERY     mitral   carpal tennel  2003   EYE SURGERY  2015  or 2016   cataract surgery    PARATHYROIDECTOMY Left 06/17/2017   Procedure: LEFT UPPER PARATHYROIDECTOMY;  Surgeon: Berna Bue, MD;  Location: [facility] ORS;  Service: General;  Laterality: Left;   TUBAL LIGATION      over 60 years ago     Current Outpatient Medications  Medication Sig Dispense Refill   acetaminophen (TYLENOL) 325 MG tablet Take 2 tablets (650 mg total) by mouth every 6 (six) hours as needed for mild pain or moderate pain.     albuterol (PROAIR HFA) 108 (  90 Base) MCG/ACT inhaler Inhale 1-2 puffs into the lungs every 6 (six) hours as needed for wheezing or shortness of breath. 18 g 5   ALPRAZolam (XANAX) 0.5 MG tablet Take 0.5 mg by mouth 2 (two) times daily as needed for anxiety or sleep.      apixaban (ELIQUIS) 5 MG TABS tablet Take 1 tablet (5 mg total) by mouth 2 (two) times daily. 60 tablet 0   Azelastine HCl 137 MCG/SPRAY SOLN Place 2 sprays into both nostrils at bedtime.     calcium carbonate (TUMS EX) 750 MG chewable tablet Chew 2 tablets by mouth as needed for heartburn.     carvedilol (COREG) 6.25 MG tablet Take 6.25 mg by mouth 2 (two) times daily with a meal.     cholecalciferol (VITAMIN D3) 25 MCG (1000 UNIT) tablet Take 1,000 Units by mouth daily.     diphenhydrAMINE (BENADRYL) 25 MG tablet Take 12.5 mg  by mouth daily as needed for itching.     Empagliflozin-linaGLIPtin (GLYXAMBI) 25-5 MG TABS Take 1 tablet by mouth daily.     Evolocumab (REPATHA) 140 MG/ML SOSY Inject into the skin every 14 (fourteen) days.     ezetimibe (ZETIA) 10 MG tablet Take 10 mg by mouth daily.     Fluticasone-Umeclidin-Vilant (TRELEGY ELLIPTA) 200-62.5-25 MCG/ACT AEPB Inhale 1 puff into the lungs daily. 60 each 6   furosemide (LASIX) 40 MG tablet Take one tablet in the AM and one tablet after lunch (early afternoon). 180 tablet 3   HYDROcodone-acetaminophen (NORCO/VICODIN) 5-325 MG tablet Take 0.5-1 tablets by mouth at bedtime.     Insulin Glargine (BASAGLAR KWIKPEN) 100 UNIT/ML Inject 14 Units into the skin daily.     ipratropium-albuterol (DUONEB) 0.5-2.5 (3) MG/3ML SOLN Take 3 mLs by nebulization every 6 (six) hours as needed. 360 mL 2   linagliptin (TRADJENTA) 5 MG TABS tablet Take 5 mg by mouth daily.     magnesium gluconate (MAGONATE) 500 MG tablet Take 500 mg by mouth daily as needed (for leg cramps).      omeprazole (PRILOSEC) 40 MG capsule Take 40 mg by mouth daily.       OXYGEN Inhale 1.5 L into the lungs at bedtime.      PARoxetine (PAXIL) 30 MG tablet Take 30 mg by mouth daily.       potassium chloride SA (KLOR-CON M) 20 MEQ tablet Take 1 tablet (20 mEq total) by mouth daily for 3 days. 3 tablet 0   tiZANidine (ZANAFLEX) 2 MG tablet Take 2 mg by mouth at bedtime as needed for muscle spasms.      No current facility-administered medications for this visit.    Allergies:   Metformin and related, Adhesive [tape], Atorvastatin, and Sulfonamide derivatives   Social History:  The patient  reports that she quit smoking about 21 years ago. Her smoking use included cigarettes. She has a 50.00 pack-year smoking history. She has never used smokeless tobacco. She reports that she does not drink alcohol and does not use drugs.   Family History:  The patient's family history includes Anuerysm in her father; Cancer in  her brother; Diabetes in her sister; Stroke in her mother; Ulcers in her sister.  ROS:  Please see the history of present illness.    All other systems are reviewed and otherwise negative.   PHYSICAL EXAM: VS:  There were no vitals taken for this visit. BMI: There is no height or weight on file to calculate BMI. Well nourished, well developed,  in no acute distress  HEENT: normocephalic, atraumatic, raspy voice Neck: no JVD, carotid bruits or masses COR: *** no significant murmurs, are appreciated no rubs, or gallops Lungs: *** CTA b/l, no wheezing, rhonchi or rales  Abd: soft, nontender MS: no deformity, age appropriate atrophy Ext:  no edema is appreciated Skin: warm and dry, no rash Neuro:  No gross deficits appreciated today Psych: euthymic mood, full affect  *** ICD site is stable, no tethering or discomfort   EKG:  not done today   ICD interrogation done today and reviewed by myself:  ***  05/11/22: TTE 1. Left ventricular ejection fraction, by estimation, is 40 to 45%. Left  ventricular ejection fraction by 2D MOD biplane is 43.3 %. The left  ventricle has mildly decreased function. The left ventricle demonstrates  global hypokinesis. Left ventricular  diastolic function could not be evaluated.   2. Right ventricular systolic function is mildly reduced. The right  ventricular size is normal.   3. Right atrial size was mildly dilated.   4. The mitral valve has been repaired/replaced. Trivial mitral valve  regurgitation. No evidence of mitral stenosis. There is a unknown size  prosthetic prosthetic annuloplasty ring present in the mitral position.   5. The aortic valve is tricuspid. There is mild calcification of the  aortic valve. Aortic valve regurgitation is not visualized. Aortic valve  sclerosis/calcification is present, without any evidence of aortic  stenosis. Aortic valve mean gradient  measures 10.0 mmHg.   6. The inferior vena cava is normal in size with  greater than 50%  respiratory variability, suggesting right atrial pressure of 3 mmHg.   Comparison(s): Changes from prior study are noted. 07/06/2021: LVEF 60-65%.    11/15/14: TTE Study Conclusions - Left ventricle: The cavity size was normal. Wall thickness was   normal. Systolic function was normal. The estimated ejection   fraction was in the range of 60% to 65%. Wall motion was normal;   there were no regional wall motion abnormalities. Previous mitral   surgery and the presence of atrial fibrillation prevents   evaluation of LV diastolic function. - Mitral valve: Prior procedures included surgical repair. An   annular ring prosthesis was present. Valve area by pressure   half-time: 1.71 cm^2. Valve area by continuity equation (using   LVOT flow): 2.09 cm^2. - Left atrium: The atrium was mildly dilated. - Pulmonary arteries: Systolic pressure was mildly increased. PA   peak pressure: 39 mm Hg (S).  2015: LVEF 50-55% 2010: LVEF 35%  Recent Labs: 05/12/2022: Magnesium 2.3 07/28/2022: ALT 11; BUN 25; Creatinine 1.53; Hemoglobin 12.2; Platelets 280; Potassium 4.1; Sodium 141  05/11/2022: Cholesterol 266; HDL 46; LDL Cholesterol 177; Total CHOL/HDL Ratio 5.8; Triglycerides 214; VLDL 43   Estimated Creatinine Clearance: 30.5 mL/min (A) (by C-G formula based on SCr of 1.53 mg/dL (H)).   Wt Readings from Last 3 Encounters:  07/28/22 185 lb 12.8 oz (84.3 kg)  06/08/22 182 lb (82.6 kg)  05/11/22 185 lb 13.6 oz (84.3 kg)     Other studies reviewed: Additional studies/records reviewed today include: summarized above  ASSESSMENT AND PLAN:  1. ICD     *** Intact function      *** no programming changes made   2. Hx of CM with recovered LVEF by last couple echos 3. Chronic CHF LVEF down again some to 40-45%     *** appears euvolemic by exam      *** CorVue  4. VHD w/Hx of MV repair     *** stable by last echo     ***   5. Paroxysmal >>> persistent AFib, hx of PE      CHA2DS2Vasc is *** 7, on Eliquis, *** appropriately dosed     *** rates         Disposition: ***  Current medicines are reviewed at length with the patient today.  The patient did not have any concerns regarding medicines  Signed, Francis Dowse, PA-C 08/04/2022 4:08 PM     [facility] [address] [place] [address] [phone] (office)  [phone] (fax)

## 2022-08-06 DIAGNOSIS — E119 Type 2 diabetes mellitus without complications: Secondary | ICD-10-CM | POA: Diagnosis not present

## 2022-08-06 DIAGNOSIS — E2839 Other primary ovarian failure: Secondary | ICD-10-CM | POA: Diagnosis not present

## 2022-08-06 DIAGNOSIS — E213 Hyperparathyroidism, unspecified: Secondary | ICD-10-CM | POA: Diagnosis not present

## 2022-08-06 DIAGNOSIS — Z13820 Encounter for screening for osteoporosis: Secondary | ICD-10-CM | POA: Diagnosis not present

## 2022-08-06 DIAGNOSIS — N951 Menopausal and female climacteric states: Secondary | ICD-10-CM | POA: Diagnosis not present

## 2022-08-07 ENCOUNTER — Ambulatory Visit: Payer: Medicare HMO | Attending: Physician Assistant | Admitting: Physician Assistant

## 2022-08-07 ENCOUNTER — Encounter: Payer: Self-pay | Admitting: Physician Assistant

## 2022-08-07 VITALS — Ht 66.5 in | Wt 186.2 lb

## 2022-08-07 DIAGNOSIS — Z95810 Presence of automatic (implantable) cardiac defibrillator: Secondary | ICD-10-CM

## 2022-08-07 DIAGNOSIS — I5022 Chronic systolic (congestive) heart failure: Secondary | ICD-10-CM | POA: Diagnosis not present

## 2022-08-07 DIAGNOSIS — I4821 Permanent atrial fibrillation: Secondary | ICD-10-CM

## 2022-08-07 DIAGNOSIS — Z9889 Other specified postprocedural states: Secondary | ICD-10-CM

## 2022-08-07 DIAGNOSIS — R55 Syncope and collapse: Secondary | ICD-10-CM

## 2022-08-07 LAB — CUP PACEART INCLINIC DEVICE CHECK
Battery Remaining Longevity: 42 mo
Battery Voltage: 2.98 V
Brady Statistic RA Percent Paced: 0 %
Brady Statistic RV Percent Paced: 63 %
Date Time Interrogation Session: 20240618173047
Implantable Lead Connection Status: 753985
Implantable Lead Connection Status: 753985
Implantable Lead Connection Status: 753985
Implantable Lead Implant Date: 20110127
Implantable Lead Implant Date: 20110127
Implantable Lead Implant Date: 20110127
Implantable Lead Location: 753858
Implantable Lead Location: 753859
Implantable Lead Location: 753860
Implantable Lead Model: 7122
Implantable Pulse Generator Implant Date: 20190418
Lead Channel Impedance Value: 612.5 Ohm
Lead Channel Impedance Value: 937.5 Ohm
Lead Channel Pacing Threshold Amplitude: 0.75 V
Lead Channel Pacing Threshold Amplitude: 0.75 V
Lead Channel Pacing Threshold Amplitude: 1.125 V
Lead Channel Pacing Threshold Pulse Width: 0.5 ms
Lead Channel Pacing Threshold Pulse Width: 0.6 ms
Lead Channel Pacing Threshold Pulse Width: 0.6 ms
Lead Channel Sensing Intrinsic Amplitude: 0.6 mV
Lead Channel Sensing Intrinsic Amplitude: 12 mV
Lead Channel Setting Pacing Amplitude: 2.125
Lead Channel Setting Pacing Amplitude: 2.5 V
Lead Channel Setting Pacing Pulse Width: 0.5 ms
Lead Channel Setting Pacing Pulse Width: 0.6 ms
Lead Channel Setting Sensing Sensitivity: 2 mV
Pulse Gen Model: 3222
Pulse Gen Serial Number: 9393517

## 2022-08-07 MED ORDER — FUROSEMIDE 40 MG PO TABS: 40.0000 mg | ORAL_TABLET | Freq: Every day | ORAL | 2 refills | Status: DC

## 2022-08-07 MED ORDER — METOPROLOL SUCCINATE ER 25 MG PO TB24: 25.0000 mg | ORAL_TABLET | Freq: Every day | ORAL | 2 refills | Status: AC

## 2022-08-07 NOTE — Patient Instructions (Addendum)
Medication Instructions:   START TAKING: TOPROL XL 25 MG ONCE A DAY    START TAKING: FUROSEMIDE  ( LASIX ) 40 MG ONCE A DAY   STOP TAKING AND REMOVE THIS MEDICATION FROM YOUR MEDICATION LIST: COREG    *If you need a refill on your cardiac medications before your next appointment, please call your pharmacy*   Lab Work: BMET TODAY    If you have labs (blood work) drawn today and your tests are completely normal, you will receive your results only by: MyChart Message (if you have MyChart) OR A paper copy in the mail If you have any lab test that is abnormal or we need to change your treatment, we will call you to review the results.   Testing/Procedures: NONE ORDERED  TODAY     Follow-Up: At [facility], you and your health needs are our priority.  As part of our continuing mission to provide you with exceptional heart care, we have created designated Provider Care Teams.  These Care Teams include your primary Cardiologist (physician) and Advanced Practice Providers (APPs -  Physician Assistants and Nurse Practitioners) who all work together to provide you with the care you need, when you need it.  We recommend signing up for the patient portal called "MyChart".  Sign up information is provided on this After Visit Summary.  MyChart is used to connect with patients for Virtual Visits (Telemedicine).  Patients are able to view lab/test results, encounter notes, upcoming appointments, etc.  Non-urgent messages can be sent to your provider as well.   To learn more about what you can do with MyChart, go to [email or web address].    Your next appointment:   2 week(s)  Provider:   Francis Dowse, PA-C    Other Instructions

## 2022-08-08 LAB — BASIC METABOLIC PANEL
BUN/Creatinine Ratio: 17 (ref 12–28)
BUN: 21 mg/dL (ref 8–27)
CO2: 22 mmol/L (ref 20–29)
Calcium: 9.7 mg/dL (ref 8.7–10.3)
Chloride: 101 mmol/L (ref 96–106)
Creatinine, Ser: 1.27 mg/dL — ABNORMAL HIGH (ref 0.57–1.00)
Glucose: 206 mg/dL — ABNORMAL HIGH (ref 70–99)
Potassium: 4.3 mmol/L (ref 3.5–5.2)
Sodium: 140 mmol/L (ref 134–144)
eGFR: 42 mL/min/{1.73_m2} — ABNORMAL LOW (ref >59)

## 2022-08-25 DIAGNOSIS — J438 Other emphysema: Secondary | ICD-10-CM | POA: Diagnosis not present

## 2022-08-30 ENCOUNTER — Other Ambulatory Visit: Payer: Self-pay | Admitting: Family Medicine

## 2022-08-30 DIAGNOSIS — R911 Solitary pulmonary nodule: Secondary | ICD-10-CM

## 2022-09-04 ENCOUNTER — Encounter: Payer: Self-pay | Admitting: Family Medicine

## 2022-09-06 ENCOUNTER — Other Ambulatory Visit: Payer: Medicare HMO

## 2022-09-11 ENCOUNTER — Inpatient Hospital Stay: Payer: Medicare HMO | Attending: Hematology and Oncology | Admitting: Hematology and Oncology

## 2022-09-11 DIAGNOSIS — D72829 Elevated white blood cell count, unspecified: Secondary | ICD-10-CM | POA: Diagnosis not present

## 2022-09-11 NOTE — Progress Notes (Signed)
[facility] CONSULT NOTE  Patient Care Team: [name], MD as PCP - General (Family Medicine) Marinus Maw, MD as PCP - Cardiology (Cardiology)  CHIEF COMPLAINTS/PURPOSE OF CONSULTATION:  Leukocytosis.  ASSESSMENT & PLAN:   This is a very pleasant 82 year old female patient with past medical history significant for chronic kidney disease, diabetes, hypertension, mitral valve disorder, osteoarthritis referred to hematology for leukocytosis.   This is a follow-up telephone call to review her recent labs.  We have reviewed that she has mild leukocytosis, mild neutrophilia, upon review of her labs for the past 4 years, she had borderline elevated white blood cells on most of her labs along with overt leukocytosis on some.  No evidence of inflammation, ESR and CRP are normal.  ANA negative.  Smear review was unremarkable.  We have discussed that since she has mild leukocytosis with no overt clinical symptoms, we can consider follow-up in approximately 6 months with repeat labs and she seems to be in agreement with this plan.  She wanted [place] to mail a copy of her labs.  HISTORY OF PRESENTING ILLNESS:  Emily Abbott 82 y.o. female is here because of leukocytosis.  This is a very pleasant 82 year old female patient with past medical history significant for osteoarthritis, CAD, CKD, COPD, mitral valve disorder on chronic anticoagulation, history of pulmonary embolism referred to hematology for evaluation and recommendations regarding leukocytosis.  She is now here for telephone visit to review her labs done in June 2024.   Rest of the pertinent 10 point ROS reviewed and negative.  MEDICAL HISTORY:  Past Medical History:  Diagnosis Date   Anemia    Anxiety    CAD (coronary artery disease)    Chronic kidney disease    CKD stage 3    COPD (chronic obstructive pulmonary disease) (HCC)    uses  1 to and1.5 L of o2 at bedtime ; has not seen her pulm doctor in over a year ; has  not had to uses inhlaer in a over a year     Diabetes mellitus    type II   GERD (gastroesophageal reflux disease)    HTN (hypertension)    essential   Hyperlipidemia, mixed    Mitral valve disorder    Osteoarthritis    Panic attack    Paroxysmal atrial fibrillation (HCC)    Pneumonia    2010   Pulmonary embolism (HCC) 2010   Sleep apnea    does not uses cpap since she got a URI from it. has home 02 as needed    SURGICAL HISTORY: Past Surgical History:  Procedure Laterality Date   BIOPSY BREAST     BIV ICD GENERATOR CHANGEOUT N/A 06/06/2017   Procedure: BIV ICD GENERATOR CHANGEOUT;  Surgeon: Marinus Maw, MD;  Location: [facility] INVASIVE CV LAB;  Service: Cardiovascular;  Laterality: N/A;   CARDIAC VALVE SURGERY     mitral   carpal tennel  2003   EYE SURGERY  2015  or 2016   cataract surgery    PARATHYROIDECTOMY Left 06/17/2017   Procedure: LEFT UPPER PARATHYROIDECTOMY;  Surgeon: Berna Bue, MD;  Location: [facility];  Service: General;  Laterality: Left;   TUBAL LIGATION      over 60 years ago     SOCIAL HISTORY: Social History   Socioeconomic History   Marital status: Widowed    Spouse name: Not on file   Number of children: Not on file   Years of education: Not  on file   Highest education level: Not on file  Occupational History   Occupation: reitred  Tobacco Use   Smoking status: Former    Current packs/day: 0.00    Average packs/day: 1 pack/day for 50.0 years (50.0 ttl pk-yrs)    Types: Cigarettes    Start date: 02/20/1951    Quit date: 02/19/2001    Years since quitting: 21.5   Smokeless tobacco: Never  Vaping Use   Vaping status: Never Used  Substance and Sexual Activity   Alcohol use: No    Alcohol/week: 0.0 standard drinks of alcohol   Drug use: Never   Sexual activity: Not on file  Other Topics Concern   Not on file  Social History Narrative   Not on file   Social Determinants of Health   Financial Resource Strain: Not on file  Food  Insecurity: No Food Insecurity (05/11/2022)   Hunger Vital Sign    Worried About Running Out of Food in the Last Year: Never true    Ran Out of Food in the Last Year: Never true  Transportation Needs: No Transportation Needs (05/11/2022)   PRAPARE - Administrator, Civil Service (Medical): No    Lack of Transportation (Non-Medical): No  Physical Activity: Not on file  Stress: Not on file  Social Connections: Not on file  Intimate Partner Violence: Not At Risk (05/11/2022)   Humiliation, Afraid, Rape, and Kick questionnaire    Fear of Current or Ex-Partner: No    Emotionally Abused: No    Physically Abused: No    Sexually Abused: No    FAMILY HISTORY: Family History  Problem Relation Age of Onset   Stroke Mother    Anuerysm Father    Diabetes Sister    Cancer Brother    Ulcers Sister     ALLERGIES:  is allergic to metformin and related, adhesive [tape], atorvastatin, and sulfonamide derivatives.  MEDICATIONS:  Current Outpatient Medications  Medication Sig Dispense Refill   acetaminophen (TYLENOL) 325 MG tablet Take 2 tablets (650 mg total) by mouth every 6 (six) hours as needed for mild pain or moderate pain. (Patient not taking: Reported on 08/07/2022)     albuterol (PROAIR HFA) 108 (90 Base) MCG/ACT inhaler Inhale 1-2 puffs into the lungs every 6 (six) hours as needed for wheezing or shortness of breath. 18 g 5   ALPRAZolam (XANAX) 0.5 MG tablet Take 0.5 mg by mouth 2 (two) times daily as needed for anxiety or sleep.      Azelastine HCl 137 MCG/SPRAY SOLN Place 2 sprays into both nostrils at bedtime. (Patient not taking: Reported on 08/07/2022)     benzonatate (TESSALON) 200 MG capsule 1 capsule  three times a day for by mouth -     calcium carbonate (TUMS EX) 750 MG chewable tablet Chew 2 tablets by mouth as needed for heartburn.     cholecalciferol (VITAMIN D3) 25 MCG (1000 UNIT) tablet Take 1,000 Units by mouth daily.     diphenhydrAMINE (BENADRYL) 25 MG tablet  Take 12.5 mg by mouth daily as needed for itching.     ELIQUIS 2.5 MG TABS tablet Take 2.5 mg by mouth 2 (two) times daily.     Empagliflozin-linaGLIPtin (GLYXAMBI) 25-5 MG TABS Take 1 tablet by mouth daily.     Evolocumab (REPATHA) 140 MG/ML SOSY Inject into the skin every 14 (fourteen) days.     ezetimibe (ZETIA) 10 MG tablet Take 10 mg by mouth daily.  fluticasone (FLONASE) 50 MCG/ACT nasal spray Place into both nostrils.     Fluticasone-Umeclidin-Vilant (TRELEGY ELLIPTA) 200-62.5-25 MCG/ACT AEPB Inhale 1 puff into the lungs daily. 60 each 6   furosemide (LASIX) 40 MG tablet Take 1 tablet (40 mg total) by mouth daily. 90 tablet 2   HYDROcodone-acetaminophen (NORCO/VICODIN) 5-325 MG tablet Take 0.5-1 tablets by mouth at bedtime.     Insulin Glargine (BASAGLAR KWIKPEN) 100 UNIT/ML Inject 14 Units into the skin daily.     ipratropium (ATROVENT) 0.06 % nasal spray Place into both nostrils.     ipratropium-albuterol (DUONEB) 0.5-2.5 (3) MG/3ML SOLN Take 3 mLs by nebulization every 6 (six) hours as needed. 360 mL 2   magnesium gluconate (MAGONATE) 500 MG tablet Take 500 mg by mouth daily as needed (for leg cramps).      metoprolol succinate (TOPROL XL) 25 MG 24 hr tablet Take 1 tablet (25 mg total) by mouth daily. 90 tablet 2   nystatin cream (MYCOSTATIN) 1 APPLICATION EXTERNALLY TWICE A DAY AS NEEDED FOR YEAST     omeprazole (PRILOSEC) 40 MG capsule Take 40 mg by mouth daily.       OXYGEN Inhale 1.5 L into the lungs at bedtime.      PARoxetine (PAXIL) 30 MG tablet Take 30 mg by mouth daily.       potassium chloride SA (KLOR-CON M) 20 MEQ tablet Take 1 tablet (20 mEq total) by mouth daily for 3 days. 3 tablet 0   tiZANidine (ZANAFLEX) 2 MG tablet Take 2 mg by mouth at bedtime as needed for muscle spasms.      triamcinolone (KENALOG) 0.1 % paste See Admin Instructions. PLEASE SEE ATTACHED FOR DETAILED DIRECTIONS     valACYclovir (VALTREX) 1000 MG tablet TAKE 1 TABLET BY MOUTH EVERY 12 HOURS AS  NEEDED FOR COLD SORES     No current facility-administered medications for this visit.     PHYSICAL EXAMINATION: ECOG PERFORMANCE STATUS: 2 - Symptomatic, <50% confined to bed  There were no vitals filed for this visit.  There were no vitals filed for this visit.   No physical exam, telephone visit  LABORATORY DATA:  I have reviewed the data as listed Lab Results  Component Value Date   WBC 11.9 (H) 07/28/2022   HGB 12.2 07/28/2022   HCT 39.8 07/28/2022   MCV 81.2 07/28/2022   PLT 280 07/28/2022     Chemistry      Component Value Date/Time   NA 140 08/07/2022 1630   K 4.3 08/07/2022 1630   CL 101 08/07/2022 1630   CO2 22 08/07/2022 1630   BUN 21 08/07/2022 1630   CREATININE 1.27 (H) 08/07/2022 1630   CREATININE 1.53 (H) 07/28/2022 1211      Component Value Date/Time   CALCIUM 9.7 08/07/2022 1630   ALKPHOS 84 07/28/2022 1211   AST 17 07/28/2022 1211   ALT 11 07/28/2022 1211   BILITOT 0.6 07/28/2022 1211     Reviewed labs from June.  RADIOGRAPHIC STUDIES: I have personally reviewed the radiological images as listed and agreed with the findings in the report. No results found.  All questions were answered. The patient knows to call the clinic with any problems, questions or concerns. I spent 12 minutes in the care of this patient including H and P, review of records, counseling and coordination of care.     Rachel Moulds, MD 09/11/2022 4:53 PM

## 2022-09-12 DIAGNOSIS — K219 Gastro-esophageal reflux disease without esophagitis: Secondary | ICD-10-CM | POA: Diagnosis not present

## 2022-09-12 DIAGNOSIS — E1121 Type 2 diabetes mellitus with diabetic nephropathy: Secondary | ICD-10-CM | POA: Diagnosis not present

## 2022-09-12 DIAGNOSIS — F3341 Major depressive disorder, recurrent, in partial remission: Secondary | ICD-10-CM | POA: Diagnosis not present

## 2022-09-12 DIAGNOSIS — J449 Chronic obstructive pulmonary disease, unspecified: Secondary | ICD-10-CM | POA: Diagnosis not present

## 2022-09-12 DIAGNOSIS — E785 Hyperlipidemia, unspecified: Secondary | ICD-10-CM | POA: Diagnosis not present

## 2022-09-12 DIAGNOSIS — N1832 Chronic kidney disease, stage 3b: Secondary | ICD-10-CM | POA: Diagnosis not present

## 2022-09-18 ENCOUNTER — Ambulatory Visit: Payer: Medicare HMO | Admitting: Physician Assistant

## 2022-09-24 ENCOUNTER — Encounter: Payer: Self-pay | Admitting: Neurology

## 2022-09-24 ENCOUNTER — Ambulatory Visit: Payer: Medicare HMO | Admitting: Neurology

## 2022-09-24 VITALS — BP 176/100 | HR 75 | Ht 67.0 in | Wt 188.5 lb

## 2022-09-24 DIAGNOSIS — I639 Cerebral infarction, unspecified: Secondary | ICD-10-CM | POA: Diagnosis not present

## 2022-09-24 DIAGNOSIS — R269 Unspecified abnormalities of gait and mobility: Secondary | ICD-10-CM | POA: Insufficient documentation

## 2022-09-24 NOTE — Progress Notes (Signed)
Chief Complaint  Patient presents with   Hospitalization Follow-up    Rm14, daughter (deborah)  Hospital [id number] follow up-- pt stated that since they've been home they have had lots of epitaxial ISSUES ON BLOOD THINNER. Gets swimmy headed frequently. Gait problems balance issues, not walking enough according to daughter.  Did orthostatic bp      ASSESSMENT AND PLAN  Emily Abbott is a 82 y.o. female   Stroke Gait abnormality  Multiple vascular risk factor, paroxysmal atrial fibrillation, history of mitral valve replacement, coronary artery disease hypertension, diabetes hyperlipidemia,  Not MRI candidate due to ICD placement  Now on Eliquis  Continue follow-up with pulmonologist, cardiologist,  Referred to physical therapy  DIAGNOSTIC DATA (LABS, IMAGING, TESTING) - I reviewed patient records, labs, notes, testing and imaging myself where available.   MEDICAL HISTORY:  Emily Abbott, is a 82 year old female accompanied by her daughter seen in request by her primary care physician Dr. [name], for evaluation of stroke, initial evaluation was on September 24, 2022  I reviewed and summarized the referring note. PMHX CAD COPD DM HTN HLD Paroxysmal atrial fibrillation MV repairmen PE  ICD placement She was taking coumadin   In March 2024 while talking with her daughter, she was noted to have slurred speech, babbling, mild left facial droop, left arm weakness, lasting for 30 minutes, ambulance was called, leading to hospital admission  She is not MRI candidate due to history of ICD replacement, already on Coumadin due to previous history of PE, paroxysmal atrial fibrillation, but INR was 1.6, she was switched from Coumadin to Eliquis 2.5 mg twice since,  Personally reviewed CT head without contrast, no acute abnormality, but extensive periventricular disease  CT angiogram of head and neck showed no large vessel disease, severe intracranial atherosclerotic  disease, 1.7 cm right upper lobe nodule, new compared to previous CT scan in July 2021  She fell in April, emergency presentation again, CT cervical spine multilevel degenerative changes, worst at the right C6, left C7  Echocardiogram in March 2024: Ejection fraction 40 to 45%, global hypokinesia, mitral valve has been repaired, trivial mitral valve regurgitation,  Laboratory: No active ESR C-reactive protein, ANA, A1c 8.0, LDL 177, creatinine 1.22,  She has diabetic peripheral neuropathy, numbness tingling at bilateral feet, unsteady gait, also made worse by her sedentary lifestyle, knee pain,   PHYSICAL EXAM:   Vitals:   09/24/22 1535  BP: (!) 176/100  Pulse: 75  Weight: 188 lb 8 oz (85.5 kg)  Height: 5\' 7"  (1.702 m)   Not recorded     Body mass index is 29.52 kg/m.  PHYSICAL EXAMNIATION:  Gen: NAD, conversant, well nourised, well groomed                     Cardiovascular: Regular rate rhythm, no peripheral edema, warm, nontender. Eyes: Conjunctivae clear without exudates or hemorrhage Neck: Supple, no carotid bruits. Pulmonary: Clear to auscultation bilaterally   NEUROLOGICAL EXAM:  MENTAL STATUS: Speech/cognition: Awake, alert, oriented to history taking and casual conversation, hoarse voice, CRANIAL NERVES: CN II: Visual fields are full to confrontation. Pupils are round equal and briskly reactive to light. CN III, IV, VI: extraocular movement are normal. No ptosis. CN V: Facial sensation is intact to light touch CN VII: Face is symmetric with normal eye closure  CN VIII: Hearing is normal to causal conversation. CN IX, X: Phonation is normal. CN XI: Head turning and shoulder shrug are intact  MOTOR: There  is no pronator drift of out-stretched arms. Muscle bulk and tone are normal. Muscle strength is normal.  REFLEXES: Reflexes are 1 and symmetric at the biceps, triceps, knees, and absent at ankles. Plantar responses are flexor.  SENSORY: Length-dependent  sensory changes  COORDINATION: There is no trunk or limb dysmetria noted.  GAIT/STANCE: Need push-up to get up from seated position, cautious unsteady  REVIEW OF SYSTEMS:  Full 14 system review of systems performed and notable only for as above All other review of systems were negative.   ALLERGIES: Allergies  Allergen Reactions   Metformin And Related Other (See Comments)    "caused me kidney problems"    Adhesive [Tape] Other (See Comments)    Burning sensation   Atorvastatin Other (See Comments)    Makes legs hurt    Sulfonamide Derivatives Other (See Comments)    itch    HOME MEDICATIONS: Current Outpatient Medications  Medication Sig Dispense Refill   albuterol (PROAIR HFA) 108 (90 Base) MCG/ACT inhaler Inhale 1-2 puffs into the lungs every 6 (six) hours as needed for wheezing or shortness of breath. 18 g 5   ALPRAZolam (XANAX) 0.5 MG tablet Take 0.5 mg by mouth 2 (two) times daily as needed for anxiety or sleep.      Azelastine HCl 137 MCG/SPRAY SOLN Place 2 sprays into both nostrils at bedtime.     benzonatate (TESSALON) 200 MG capsule 1 capsule  three times a day for by mouth -     calcium carbonate (TUMS EX) 750 MG chewable tablet Chew 2 tablets by mouth as needed for heartburn.     cholecalciferol (VITAMIN D3) 25 MCG (1000 UNIT) tablet Take 1,000 Units by mouth daily.     diphenhydrAMINE (BENADRYL) 25 MG tablet Take 12.5 mg by mouth daily as needed for itching.     ELIQUIS 2.5 MG TABS tablet Take 2.5 mg by mouth 2 (two) times daily.     Empagliflozin-linaGLIPtin (GLYXAMBI) 25-5 MG TABS Take 1 tablet by mouth daily.     Evolocumab (REPATHA) 140 MG/ML SOSY Inject into the skin every 14 (fourteen) days.     ezetimibe (ZETIA) 10 MG tablet Take 10 mg by mouth daily.     fluticasone (FLONASE) 50 MCG/ACT nasal spray Place into both nostrils.     Fluticasone-Umeclidin-Vilant (TRELEGY ELLIPTA) 200-62.5-25 MCG/ACT AEPB Inhale 1 puff into the lungs daily. 60 each 6    furosemide (LASIX) 40 MG tablet Take 1 tablet (40 mg total) by mouth daily. 90 tablet 2   HYDROcodone-acetaminophen (NORCO/VICODIN) 5-325 MG tablet Take 0.5-1 tablets by mouth at bedtime.     Insulin Glargine (BASAGLAR KWIKPEN) 100 UNIT/ML Inject 14 Units into the skin daily.     ipratropium (ATROVENT) 0.06 % nasal spray Place into both nostrils.     ipratropium-albuterol (DUONEB) 0.5-2.5 (3) MG/3ML SOLN Take 3 mLs by nebulization every 6 (six) hours as needed. 360 mL 2   magnesium gluconate (MAGONATE) 500 MG tablet Take 500 mg by mouth daily as needed (for leg cramps).      metoprolol succinate (TOPROL XL) 25 MG 24 hr tablet Take 1 tablet (25 mg total) by mouth daily. 90 tablet 2   nystatin cream (MYCOSTATIN) 1 APPLICATION EXTERNALLY TWICE A DAY AS NEEDED FOR YEAST     omeprazole (PRILOSEC) 40 MG capsule Take 40 mg by mouth daily.       OXYGEN Inhale 1.5 L into the lungs at bedtime.      PARoxetine (PAXIL) 30 MG tablet Take  30 mg by mouth daily.       tiZANidine (ZANAFLEX) 2 MG tablet Take 2 mg by mouth at bedtime as needed for muscle spasms.      triamcinolone (KENALOG) 0.1 % paste See Admin Instructions. PLEASE SEE ATTACHED FOR DETAILED DIRECTIONS     valACYclovir (VALTREX) 1000 MG tablet TAKE 1 TABLET BY MOUTH EVERY 12 HOURS AS NEEDED FOR COLD SORES     potassium chloride SA (KLOR-CON M) 20 MEQ tablet Take 1 tablet (20 mEq total) by mouth daily for 3 days. 3 tablet 0   No current facility-administered medications for this visit.    PAST MEDICAL HISTORY: Past Medical History:  Diagnosis Date   Anemia    Anxiety    CAD (coronary artery disease)    Chronic kidney disease    CKD stage 3    COPD (chronic obstructive pulmonary disease) (HCC)    uses  1 to and1.5 L of o2 at bedtime ; has not seen her pulm doctor in over a year ; has not had to uses inhlaer in a over a year     Diabetes mellitus    type II   GERD (gastroesophageal reflux disease)    HTN (hypertension)    essential    Hyperlipidemia, mixed    Mitral valve disorder    Osteoarthritis    Panic attack    Paroxysmal atrial fibrillation (HCC)    Pneumonia    2010   Pulmonary embolism (HCC) 2010   Sleep apnea    does not uses cpap since she got a URI from it. has home 02 as needed    PAST SURGICAL HISTORY: Past Surgical History:  Procedure Laterality Date   BIOPSY BREAST     BIV ICD GENERATOR CHANGEOUT N/A 06/06/2017   Procedure: BIV ICD GENERATOR CHANGEOUT;  Surgeon: Marinus Maw, MD;  Location: [facility] INVASIVE CV LAB;  Service: Cardiovascular;  Laterality: N/A;   CARDIAC VALVE SURGERY     mitral   carpal tennel  2003   EYE SURGERY  2015  or 2016   cataract surgery    PARATHYROIDECTOMY Left 06/17/2017   Procedure: LEFT UPPER PARATHYROIDECTOMY;  Surgeon: Berna Bue, MD;  Location: [facility];  Service: General;  Laterality: Left;   TUBAL LIGATION      over 60 years ago     FAMILY HISTORY: Family History  Problem Relation Age of Onset   Stroke Mother    Anuerysm Father    Diabetes Sister    Cancer Brother    Ulcers Sister     SOCIAL HISTORY: Social History   Socioeconomic History   Marital status: Widowed    Spouse name: Not on file   Number of children: 4   Years of education: Not on file   Highest education level: 11th grade  Occupational History   Occupation: reitred  Tobacco Use   Smoking status: Former    Current packs/day: 0.00    Average packs/day: 1 pack/day for 50.0 years (50.0 ttl pk-yrs)    Types: Cigarettes    Start date: 02/20/1951    Quit date: 02/19/2001    Years since quitting: 21.6   Smokeless tobacco: Never  Vaping Use   Vaping status: Never Used  Substance and Sexual Activity   Alcohol use: Not Currently   Drug use: Never   Sexual activity: Not Currently  Other Topics Concern   Not on file  Social History Narrative   Not on file   Social Determinants  of Health   Financial Resource Strain: Not on file  Food Insecurity: No Food Insecurity (05/11/2022)    Hunger Vital Sign    Worried About Running Out of Food in the Last Year: Never true    Ran Out of Food in the Last Year: Never true  Transportation Needs: No Transportation Needs (05/11/2022)   PRAPARE - Administrator, Civil Service (Medical): No    Lack of Transportation (Non-Medical): No  Physical Activity: Not on file  Stress: Not on file  Social Connections: Not on file  Intimate Partner Violence: Not At Risk (05/11/2022)   Humiliation, Afraid, Rape, and Kick questionnaire    Fear of Current or Ex-Partner: No    Emotionally Abused: No    Physically Abused: No    Sexually Abused: No      Levert Feinstein, M.D. Ph.D.  [facility] [address], [address] [place], [place] [address] Ph: [phone] Fax: [phone]  CC:  Ceasar Lund, PA [address] [place],  [place] [address]  [name], MD

## 2022-09-25 ENCOUNTER — Telehealth: Payer: Self-pay | Admitting: Neurology

## 2022-09-25 ENCOUNTER — Ambulatory Visit
Admission: RE | Admit: 2022-09-25 | Discharge: 2022-09-25 | Disposition: A | Discharge status: Home / Self Care | Payer: Medicare HMO | Source: Ambulatory Visit | Attending: Family Medicine | Admitting: Family Medicine

## 2022-09-25 DIAGNOSIS — J438 Other emphysema: Secondary | ICD-10-CM | POA: Diagnosis not present

## 2022-09-25 DIAGNOSIS — R911 Solitary pulmonary nodule: Secondary | ICD-10-CM | POA: Diagnosis not present

## 2022-09-25 NOTE — Telephone Encounter (Signed)
Pt has been accepted by [facility], they will reach out to her to set up.

## 2022-09-28 ENCOUNTER — Ambulatory Visit (INDEPENDENT_AMBULATORY_CARE_PROVIDER_SITE_OTHER): Payer: Medicare HMO

## 2022-09-28 DIAGNOSIS — I5022 Chronic systolic (congestive) heart failure: Secondary | ICD-10-CM

## 2022-09-28 DIAGNOSIS — I428 Other cardiomyopathies: Secondary | ICD-10-CM | POA: Diagnosis not present

## 2022-09-28 LAB — CUP PACEART REMOTE DEVICE CHECK
Battery Remaining Longevity: 38 mo
Battery Remaining Percentage: 38 %
Battery Voltage: 2.96 V
Date Time Interrogation Session: 20240809054045
Implantable Lead Connection Status: 753985
Implantable Lead Connection Status: 753985
Implantable Lead Connection Status: 753985
Implantable Lead Implant Date: 20110127
Implantable Lead Implant Date: 20110127
Implantable Lead Implant Date: 20110127
Implantable Lead Location: 753858
Implantable Lead Location: 753859
Implantable Lead Location: 753860
Implantable Lead Model: 7122
Implantable Pulse Generator Implant Date: 20190418
Lead Channel Impedance Value: 610 Ohm
Lead Channel Impedance Value: 860 Ohm
Lead Channel Pacing Threshold Amplitude: 0.625 V
Lead Channel Pacing Threshold Amplitude: 0.75 V
Lead Channel Pacing Threshold Pulse Width: 0.5 ms
Lead Channel Pacing Threshold Pulse Width: 0.6 ms
Lead Channel Sensing Intrinsic Amplitude: 12 mV
Lead Channel Setting Pacing Amplitude: 2 V
Lead Channel Setting Pacing Amplitude: 2.5 V
Lead Channel Setting Pacing Pulse Width: 0.5 ms
Lead Channel Setting Pacing Pulse Width: 0.6 ms
Lead Channel Setting Sensing Sensitivity: 2 mV
Pulse Gen Model: 3222
Pulse Gen Serial Number: 9393517

## 2022-10-08 DIAGNOSIS — Z961 Presence of intraocular lens: Secondary | ICD-10-CM | POA: Diagnosis not present

## 2022-10-08 DIAGNOSIS — H35363 Drusen (degenerative) of macula, bilateral: Secondary | ICD-10-CM | POA: Diagnosis not present

## 2022-10-08 DIAGNOSIS — H53121 Transient visual loss, right eye: Secondary | ICD-10-CM | POA: Diagnosis not present

## 2022-10-08 DIAGNOSIS — E119 Type 2 diabetes mellitus without complications: Secondary | ICD-10-CM | POA: Diagnosis not present

## 2022-10-08 DIAGNOSIS — H43811 Vitreous degeneration, right eye: Secondary | ICD-10-CM | POA: Diagnosis not present

## 2022-10-08 DIAGNOSIS — H35033 Hypertensive retinopathy, bilateral: Secondary | ICD-10-CM | POA: Diagnosis not present

## 2022-10-12 NOTE — Progress Notes (Signed)
Remote pacemaker transmission.   

## 2022-10-26 DIAGNOSIS — J438 Other emphysema: Secondary | ICD-10-CM | POA: Diagnosis not present

## 2022-10-29 ENCOUNTER — Telehealth: Payer: Self-pay

## 2022-10-29 NOTE — Telephone Encounter (Signed)
The patient wants to reschedule her appointment with [name]. I told her the scheduler will give her a call back. She said to leave a message on her voicemail and she will call back.

## 2022-11-08 ENCOUNTER — Telehealth: Payer: Self-pay | Admitting: Internal Medicine

## 2022-11-08 NOTE — Telephone Encounter (Signed)
Called patient about her message. Patient complaining of elevated BP yesterday and today, but it's better than it was on Tuesday. HR is running between 70 and 80.  Encouraged patient to take her medications and reduce her salt intake. Patient stated she is seeing her PCP tomorrow. Patient's blurred visions is not new and has been going on for a while. Patient stated she does get dizzy at times when standing, but not always. Patient also stated she had a headache, but not like she did when she had her TIAs. Encouraged patient if her symptoms get worse to go to the ED, and to keep her appointment with her PCP. Will forward to Dr. Ladona Ridgel for further advisement.

## 2022-11-08 NOTE — Telephone Encounter (Signed)
Pt c/o BP issue: STAT if pt c/o blurred vision, one-sided weakness or slurred speech  1. What are your last 5 BP readings?   9/17 - 168/103         - 159/108 9/18 - 137/81 9/19 - 149/87  2. Are you having any other symptoms (ex. Dizziness, headache, blurred vision, passed out)?   Dizziness, blurred vision, headache  3. What is your BP issue?   Patient stated she has been having erratic high BP readings and is concerned regarding next steps

## 2022-11-09 ENCOUNTER — Other Ambulatory Visit: Payer: Self-pay | Admitting: Family Medicine

## 2022-11-09 DIAGNOSIS — I129 Hypertensive chronic kidney disease with stage 1 through stage 4 chronic kidney disease, or unspecified chronic kidney disease: Secondary | ICD-10-CM | POA: Diagnosis not present

## 2022-11-09 DIAGNOSIS — R911 Solitary pulmonary nodule: Secondary | ICD-10-CM

## 2022-11-09 DIAGNOSIS — E1121 Type 2 diabetes mellitus with diabetic nephropathy: Secondary | ICD-10-CM | POA: Diagnosis not present

## 2022-11-09 DIAGNOSIS — I951 Orthostatic hypotension: Secondary | ICD-10-CM | POA: Diagnosis not present

## 2022-11-09 DIAGNOSIS — N1832 Chronic kidney disease, stage 3b: Secondary | ICD-10-CM | POA: Diagnosis not present

## 2022-11-09 NOTE — Telephone Encounter (Signed)
Pt did see PCP today and BP addressed.

## 2022-11-09 NOTE — Telephone Encounter (Signed)
Agree 

## 2022-11-11 ENCOUNTER — Other Ambulatory Visit: Payer: Self-pay | Admitting: Pulmonary Disease

## 2022-11-15 ENCOUNTER — Other Ambulatory Visit: Payer: Self-pay

## 2022-11-15 MED ORDER — ALBUTEROL SULFATE HFA 108 (90 BASE) MCG/ACT IN AERS: 1.0000 | INHALATION_SPRAY | Freq: Four times a day (QID) | RESPIRATORY_TRACT | 1 refills | Status: AC | PRN

## 2022-11-16 ENCOUNTER — Ambulatory Visit: Payer: Medicare HMO | Admitting: Pulmonary Disease

## 2022-11-21 DIAGNOSIS — M6281 Muscle weakness (generalized): Secondary | ICD-10-CM | POA: Diagnosis not present

## 2022-11-21 DIAGNOSIS — R262 Difficulty in walking, not elsewhere classified: Secondary | ICD-10-CM | POA: Diagnosis not present

## 2022-11-25 DIAGNOSIS — J438 Other emphysema: Secondary | ICD-10-CM | POA: Diagnosis not present

## 2022-11-29 DIAGNOSIS — Z9981 Dependence on supplemental oxygen: Secondary | ICD-10-CM | POA: Diagnosis not present

## 2022-11-29 DIAGNOSIS — J449 Chronic obstructive pulmonary disease, unspecified: Secondary | ICD-10-CM | POA: Diagnosis not present

## 2022-11-29 DIAGNOSIS — J441 Chronic obstructive pulmonary disease with (acute) exacerbation: Secondary | ICD-10-CM | POA: Diagnosis not present

## 2022-11-29 DIAGNOSIS — J9611 Chronic respiratory failure with hypoxia: Secondary | ICD-10-CM | POA: Diagnosis not present

## 2022-12-03 ENCOUNTER — Telehealth: Payer: Self-pay | Admitting: Pulmonary Disease

## 2022-12-03 DIAGNOSIS — J441 Chronic obstructive pulmonary disease with (acute) exacerbation: Secondary | ICD-10-CM | POA: Diagnosis not present

## 2022-12-03 DIAGNOSIS — R051 Acute cough: Secondary | ICD-10-CM | POA: Diagnosis not present

## 2022-12-03 DIAGNOSIS — R062 Wheezing: Secondary | ICD-10-CM | POA: Diagnosis not present

## 2022-12-03 NOTE — Telephone Encounter (Signed)
Pt states she has been using her neb and trelegy inhaler. Pt has not been seen since 10/14/21. Has concerns of SOB and cough that has been ongoing for 2 weeks. Has also reached out to her PCP. Dr. Val Eagle please advise

## 2022-12-03 NOTE — Telephone Encounter (Signed)
Pt is extremely SOB, and has excessive coughing

## 2022-12-04 ENCOUNTER — Other Ambulatory Visit: Payer: Self-pay | Admitting: Pulmonary Disease

## 2022-12-04 MED ORDER — AZITHROMYCIN 250 MG PO TABS: ORAL_TABLET | ORAL | 0 refills | Status: DC

## 2022-12-04 MED ORDER — PREDNISONE 20 MG PO TABS: 20.0000 mg | ORAL_TABLET | Freq: Every day | ORAL | 0 refills | Status: DC

## 2022-12-04 NOTE — Telephone Encounter (Signed)
Needs scheduled for follow-up  Will call in a course of antibiotics and steroids-this does not preclude the fact that she should follow-up

## 2022-12-04 NOTE — Telephone Encounter (Signed)
I last saw the patient in 2018.  Since then the patient has reestablished with Dr. Val Eagle.  Sending the message to him

## 2022-12-05 NOTE — Telephone Encounter (Signed)
Spoke with patient regarding prior message. Advised patient that Dr.Olalere has sent in  course of antibiotics and steroids-this does not preclude the fact that she should follow-up. Patient stated she will call and make a f/u once she is done with her course of antibiotics. Patient's voice was understanding.Nothing else further needed.

## 2022-12-19 ENCOUNTER — Ambulatory Visit
Admission: RE | Admit: 2022-12-19 | Discharge: 2022-12-19 | Disposition: A | Discharge status: Home / Self Care | Payer: Medicare HMO | Source: Ambulatory Visit | Attending: Family Medicine | Admitting: Family Medicine

## 2022-12-19 DIAGNOSIS — D3501 Benign neoplasm of right adrenal gland: Secondary | ICD-10-CM | POA: Diagnosis not present

## 2022-12-19 DIAGNOSIS — I700 Atherosclerosis of aorta: Secondary | ICD-10-CM | POA: Diagnosis not present

## 2022-12-19 DIAGNOSIS — R918 Other nonspecific abnormal finding of lung field: Secondary | ICD-10-CM | POA: Diagnosis not present

## 2022-12-19 DIAGNOSIS — R911 Solitary pulmonary nodule: Secondary | ICD-10-CM

## 2022-12-19 DIAGNOSIS — J439 Emphysema, unspecified: Secondary | ICD-10-CM | POA: Diagnosis not present

## 2022-12-23 NOTE — Progress Notes (Deleted)
Cardiology Office Note Date:  12/23/2022  Patient ID:  Emily, Abbott 1940/08/18, MRN [id number] PCP:  [name], MD  Electrophysiologist: Dr. Ladona Ridgel   Chief Complaint:   *** annual visit  History of Present Illness: Emily Abbott is a 82 y.o. female with history of no known CAD, Notes mention history of normal coronaries by cath in 2010, no further cath procedures that I find, CM, LBBB, w/ICD, chronic CHF, HTN, HLD, DM, COPD with QHS and PRN home O2, uses infrequently other then at HS,  Hx of PE and PAFib.  She also has hx of MV repair (28mm Physio ring annuloplasty) done in 2010, with LAA ligation and MAZE inter-op.  She saw Dr. Ladona Ridgel 08/29/21, doing well, minimal palpitations, some edema, mild DOE. Planned to transition from warfarin > Eliquis, no other changes   Subsequent calls regarding eliquis and warfarin, appears she never made the transition, unclear why  Pt called May with reports of nose bleeds though staff unable to reach her in return calls   Admitted 05/09/21 treated for TIA, INR subtherapeutic and warfarin changed to Eliquis Discharged 05/12/22  LVEF during the stay noted 40-45%, down from her prior 60-65% Mild RVEF reduction MVRepair functioning well  I saw her June 2023 She is accompanied by her [place] daughter today She thinks she may be allergic to the Eliquis, has a slight generalized itch that started not long after making the change, no rash, no unusual SOB, swelling. No bleeding or signs of bleeding She is unsure why/how she fainted/fell in the kitchen that led to her ER visit. She was just doing something in her kitchen and the next thing she knew she was on the floor. She had no obvious warning that she recalls.  Thinks she felt fine when she woke, but unable to get up off the floor, perhaps too weak, and her knees also are terrible and probably wound not have been able to get up on her own anyway. No associated CP, palpitations Denies any regular  or significant orthostatic dizziness. She has baseline SOB/DOE, nothing unusual of late, uses O2 HS, the Trilegy dries her up, she does use a spacer. -- discussed changing to Xarelto given some itching on Eliquis, she preferred not to change -- had + orthostatic vitals, meds adjusted -- poor BP%, chronically -- planned for early f/u >> not done  *** eliquis, bleeding, dose, labs, ?? Itching *** BP% *** AF rates *** ? Increase pacing rate? *** BP? Orthostatic, recent calls with high numbers >> PMD managed  Device information: SJM CRT-D, implanted 03/17/09 > gen change downgrade to CRT-P April 2019  AFib/AAD Hx Tikosyn >> stopped with progression to persistent AFib 2019  Past Medical History:  Diagnosis Date   Anemia    Anxiety    CAD (coronary artery disease)    Chronic kidney disease    CKD stage 3    COPD (chronic obstructive pulmonary disease) (HCC)    uses  1 to and1.5 L of o2 at bedtime ; has not seen her pulm doctor in over a year ; has not had to uses inhlaer in a over a year     Diabetes mellitus    type II   GERD (gastroesophageal reflux disease)    HTN (hypertension)    essential   Hyperlipidemia, mixed    Mitral valve disorder    Osteoarthritis    Panic attack    Paroxysmal atrial fibrillation (HCC)    Pneumonia  2010   Pulmonary embolism (HCC) 2010   Sleep apnea    does not uses cpap since she got a URI from it. has home 02 as needed    Past Surgical History:  Procedure Laterality Date   BIOPSY BREAST     BIV ICD GENERATOR CHANGEOUT N/A 06/06/2017   Procedure: BIV ICD GENERATOR CHANGEOUT;  Surgeon: Marinus Maw, MD;  Location: [facility] INVASIVE CV LAB;  Service: Cardiovascular;  Laterality: N/A;   CARDIAC VALVE SURGERY     mitral   carpal tennel  2003   EYE SURGERY  2015  or 2016   cataract surgery    PARATHYROIDECTOMY Left 06/17/2017   Procedure: LEFT UPPER PARATHYROIDECTOMY;  Surgeon: Berna Bue, MD;  Location: [facility]S;  Service: General;   Laterality: Left;   TUBAL LIGATION      over 60 years ago     Current Outpatient Medications  Medication Sig Dispense Refill   albuterol (PROAIR HFA) 108 (90 Base) MCG/ACT inhaler Inhale 1-2 puffs into the lungs every 6 (six) hours as needed for wheezing or shortness of breath. 18 g 1   albuterol (VENTOLIN HFA) 108 (90 Base) MCG/ACT inhaler INHALE 1-2 PUFFS BY MOUTH EVERY 6 HOURS AS NEEDED FOR WHEEZE OR SHORTNESS OF BREATH 8.5 each 5   ALPRAZolam (XANAX) 0.5 MG tablet Take 0.5 mg by mouth 2 (two) times daily as needed for anxiety or sleep.      Azelastine HCl 137 MCG/SPRAY SOLN Place 2 sprays into both nostrils at bedtime.     azithromycin (ZITHROMAX Z-PAK) 250 MG tablet Take 2 tablets day 1 and then 1 daily for 4 days 6 each 0   benzonatate (TESSALON) 200 MG capsule 1 capsule  three times a day for by mouth -     calcium carbonate (TUMS EX) 750 MG chewable tablet Chew 2 tablets by mouth as needed for heartburn.     cholecalciferol (VITAMIN D3) 25 MCG (1000 UNIT) tablet Take 1,000 Units by mouth daily.     diphenhydrAMINE (BENADRYL) 25 MG tablet Take 12.5 mg by mouth daily as needed for itching.     ELIQUIS 2.5 MG TABS tablet Take 2.5 mg by mouth 2 (two) times daily.     Empagliflozin-linaGLIPtin (GLYXAMBI) 25-5 MG TABS Take 1 tablet by mouth daily.     Evolocumab (REPATHA) 140 MG/ML SOSY Inject into the skin every 14 (fourteen) days.     ezetimibe (ZETIA) 10 MG tablet Take 10 mg by mouth daily.     fluticasone (FLONASE) 50 MCG/ACT nasal spray Place into both nostrils.     Fluticasone-Umeclidin-Vilant (TRELEGY ELLIPTA) 200-62.5-25 MCG/ACT AEPB Inhale 1 puff into the lungs daily. 60 each 6   furosemide (LASIX) 40 MG tablet Take 1 tablet (40 mg total) by mouth daily. 90 tablet 2   HYDROcodone-acetaminophen (NORCO/VICODIN) 5-325 MG tablet Take 0.5-1 tablets by mouth at bedtime.     Insulin Glargine (BASAGLAR KWIKPEN) 100 UNIT/ML Inject 14 Units into the skin daily.     ipratropium (ATROVENT)  0.06 % nasal spray Place into both nostrils.     ipratropium-albuterol (DUONEB) 0.5-2.5 (3) MG/3ML SOLN Take 3 mLs by nebulization every 6 (six) hours as needed. 360 mL 2   magnesium gluconate (MAGONATE) 500 MG tablet Take 500 mg by mouth daily as needed (for leg cramps).      metoprolol succinate (TOPROL XL) 25 MG 24 hr tablet Take 1 tablet (25 mg total) by mouth daily. 90 tablet 2   nystatin cream (  MYCOSTATIN) 1 APPLICATION EXTERNALLY TWICE A DAY AS NEEDED FOR YEAST     omeprazole (PRILOSEC) 40 MG capsule Take 40 mg by mouth daily.       OXYGEN Inhale 1.5 L into the lungs at bedtime.      PARoxetine (PAXIL) 30 MG tablet Take 30 mg by mouth daily.       potassium chloride SA (KLOR-CON M) 20 MEQ tablet Take 1 tablet (20 mEq total) by mouth daily for 3 days. 3 tablet 0   predniSONE (DELTASONE) 20 MG tablet Take 1 tablet (20 mg total) by mouth daily with breakfast. 7 tablet 0   tiZANidine (ZANAFLEX) 2 MG tablet Take 2 mg by mouth at bedtime as needed for muscle spasms.      triamcinolone (KENALOG) 0.1 % paste See Admin Instructions. PLEASE SEE ATTACHED FOR DETAILED DIRECTIONS     valACYclovir (VALTREX) 1000 MG tablet TAKE 1 TABLET BY MOUTH EVERY 12 HOURS AS NEEDED FOR COLD SORES     No current facility-administered medications for this visit.    Allergies:   Metformin and related, Adhesive [tape], Atorvastatin, and Sulfonamide derivatives   Social History:  The patient  reports that she quit smoking about 21 years ago. Her smoking use included cigarettes. She started smoking about 71 years ago. She has a 50 pack-year smoking history. She has never used smokeless tobacco. She reports that she does not currently use alcohol. She reports that she does not use drugs.   Family History:  The patient's family history includes Anuerysm in her father; Cancer in her brother; Diabetes in her sister; Stroke in her mother; Ulcers in her sister.  ROS:  Please see the history of present illness.    All other  systems are reviewed and otherwise negative.   PHYSICAL EXAM: VS:  There were no vitals taken for this visit. BMI: There is no height or weight on file to calculate BMI. Well nourished, well developed, in no acute distress  HEENT: normocephalic, atraumatic, raspy voice Neck: no JVD, carotid bruits or masses COR: *** irreg-irreg, no significant murmurs, are appreciated no rubs, or gallops Lungs: *** CTA b/l, no wheezing, rhonchi or rales  Abd: soft, nontender MS: no deformity, age appropriate atrophy Ext: *** no edema is appreciated Skin: warm and dry, no rash Neuro:  No gross deficits appreciated today Psych: euthymic mood, full affect  *** ICD site is stable, no tethering or discomfort   EKG:  not done today   ICD interrogation done today and reviewed by myself:  *** Battery and lead measurements are stable ***  05/11/22: TTE 1. Left ventricular ejection fraction, by estimation, is 40 to 45%. Left  ventricular ejection fraction by 2D MOD biplane is 43.3 %. The left  ventricle has mildly decreased function. The left ventricle demonstrates  global hypokinesis. Left ventricular  diastolic function could not be evaluated.   2. Right ventricular systolic function is mildly reduced. The right  ventricular size is normal.   3. Right atrial size was mildly dilated.   4. The mitral valve has been repaired/replaced. Trivial mitral valve  regurgitation. No evidence of mitral stenosis. There is a unknown size  prosthetic prosthetic annuloplasty ring present in the mitral position.   5. The aortic valve is tricuspid. There is mild calcification of the  aortic valve. Aortic valve regurgitation is not visualized. Aortic valve  sclerosis/calcification is present, without any evidence of aortic  stenosis. Aortic valve mean gradient  measures 10.0 mmHg.   6. The  inferior vena cava is normal in size with greater than 50%  respiratory variability, suggesting right atrial pressure of 3 mmHg.    Comparison(s): Changes from prior study are noted. 07/06/2021: LVEF 60-65%.    11/15/14: TTE Study Conclusions - Left ventricle: The cavity size was normal. Wall thickness was   normal. Systolic function was normal. The estimated ejection   fraction was in the range of 60% to 65%. Wall motion was normal;   there were no regional wall motion abnormalities. Previous mitral   surgery and the presence of atrial fibrillation prevents   evaluation of LV diastolic function. - Mitral valve: Prior procedures included surgical repair. An   annular ring prosthesis was present. Valve area by pressure   half-time: 1.71 cm^2. Valve area by continuity equation (using   LVOT flow): 2.09 cm^2. - Left atrium: The atrium was mildly dilated. - Pulmonary arteries: Systolic pressure was mildly increased. PA   peak pressure: 39 mm Hg (S).  2015: LVEF 50-55% 2010: LVEF 35%  Recent Labs: 05/12/2022: Magnesium 2.3 07/28/2022: ALT 11; Hemoglobin 12.2; Platelets 280 08/07/2022: BUN 21; Creatinine, Ser 1.27; Potassium 4.3; Sodium 140  05/11/2022: Cholesterol 266; HDL 46; LDL Cholesterol 177; Total CHOL/HDL Ratio 5.8; Triglycerides 214; VLDL 43   CrCl cannot be calculated (Patient's most recent lab result is older than the maximum 21 days allowed.).   Wt Readings from Last 3 Encounters:  09/24/22 188 lb 8 oz (85.5 kg)  08/07/22 186 lb 3.2 oz (84.5 kg)  07/28/22 185 lb 12.8 oz (84.3 kg)     Other studies reviewed: Additional studies/records reviewed today include: summarized above  ASSESSMENT AND PLAN:  1. PPM     *** Intact function      *** no programming changes made   2. Hx of CM  3. Chronic CHF LVEF down again some to 40-45% (from 2016)     *** appears euvolemic by exam      *** CorVue suggests volume, though appears to wobble up/down     *** BP%, though this also not new for her     ***        4. VHD w/Hx of MV repair     stable by last echo, March 2024    5. Paroxysmal >>> permanent  AFib, hx of PE     CHA2DS2Vasc is 7, on Eliquis, *** appropriately dosed for age/renal function     *** controlled rates     ***    6. Syncope + orthostatic BPs  ***    Disposition: ***  Current medicines are reviewed at length with the patient today.  The patient did not have any concerns regarding medicines  Signed, Francis Dowse, PA-C 12/23/2022 8:18 AM     [facility] [address] [place] [address] [phone] (office)  [phone] (fax)

## 2022-12-25 ENCOUNTER — Ambulatory Visit: Payer: Medicare HMO | Attending: Physician Assistant | Admitting: Physician Assistant

## 2022-12-26 DIAGNOSIS — J438 Other emphysema: Secondary | ICD-10-CM | POA: Diagnosis not present

## 2022-12-28 ENCOUNTER — Ambulatory Visit (INDEPENDENT_AMBULATORY_CARE_PROVIDER_SITE_OTHER): Payer: Medicare HMO

## 2022-12-28 DIAGNOSIS — I428 Other cardiomyopathies: Secondary | ICD-10-CM | POA: Diagnosis not present

## 2022-12-28 LAB — CUP PACEART REMOTE DEVICE CHECK
Battery Remaining Longevity: 36 mo
Battery Remaining Percentage: 35 %
Battery Voltage: 2.96 V
Date Time Interrogation Session: 20241108030024
Implantable Lead Connection Status: 753985
Implantable Lead Connection Status: 753985
Implantable Lead Connection Status: 753985
Implantable Lead Implant Date: 20110127
Implantable Lead Implant Date: 20110127
Implantable Lead Implant Date: 20110127
Implantable Lead Location: 753858
Implantable Lead Location: 753859
Implantable Lead Location: 753860
Implantable Lead Model: 7122
Implantable Pulse Generator Implant Date: 20190418
Lead Channel Impedance Value: 610 Ohm
Lead Channel Impedance Value: 910 Ohm
Lead Channel Pacing Threshold Amplitude: 0.75 V
Lead Channel Pacing Threshold Amplitude: 1 V
Lead Channel Pacing Threshold Pulse Width: 0.5 ms
Lead Channel Pacing Threshold Pulse Width: 0.6 ms
Lead Channel Sensing Intrinsic Amplitude: 12 mV
Lead Channel Setting Pacing Amplitude: 2 V
Lead Channel Setting Pacing Amplitude: 2.5 V
Lead Channel Setting Pacing Pulse Width: 0.5 ms
Lead Channel Setting Pacing Pulse Width: 0.6 ms
Lead Channel Setting Sensing Sensitivity: 2 mV
Pulse Gen Model: 3222
Pulse Gen Serial Number: 9393517

## 2023-01-08 NOTE — Progress Notes (Signed)
Remote pacemaker transmission.   

## 2023-01-15 DIAGNOSIS — J449 Chronic obstructive pulmonary disease, unspecified: Secondary | ICD-10-CM | POA: Diagnosis not present

## 2023-01-15 DIAGNOSIS — E1122 Type 2 diabetes mellitus with diabetic chronic kidney disease: Secondary | ICD-10-CM | POA: Diagnosis not present

## 2023-01-15 DIAGNOSIS — G720 Drug-induced myopathy: Secondary | ICD-10-CM | POA: Diagnosis not present

## 2023-01-15 DIAGNOSIS — J9611 Chronic respiratory failure with hypoxia: Secondary | ICD-10-CM | POA: Diagnosis not present

## 2023-01-15 DIAGNOSIS — Z9981 Dependence on supplemental oxygen: Secondary | ICD-10-CM | POA: Diagnosis not present

## 2023-01-15 DIAGNOSIS — N1832 Chronic kidney disease, stage 3b: Secondary | ICD-10-CM | POA: Diagnosis not present

## 2023-01-15 DIAGNOSIS — I480 Paroxysmal atrial fibrillation: Secondary | ICD-10-CM | POA: Diagnosis not present

## 2023-01-15 DIAGNOSIS — D6869 Other thrombophilia: Secondary | ICD-10-CM | POA: Diagnosis not present

## 2023-01-15 DIAGNOSIS — E785 Hyperlipidemia, unspecified: Secondary | ICD-10-CM | POA: Diagnosis not present

## 2023-01-15 DIAGNOSIS — I5022 Chronic systolic (congestive) heart failure: Secondary | ICD-10-CM | POA: Diagnosis not present

## 2023-01-15 DIAGNOSIS — I700 Atherosclerosis of aorta: Secondary | ICD-10-CM | POA: Diagnosis not present

## 2023-01-15 DIAGNOSIS — I129 Hypertensive chronic kidney disease with stage 1 through stage 4 chronic kidney disease, or unspecified chronic kidney disease: Secondary | ICD-10-CM | POA: Diagnosis not present

## 2023-02-04 ENCOUNTER — Ambulatory Visit: Payer: PPO | Admitting: Pulmonary Disease

## 2023-02-04 VITALS — BP 134/70 | HR 60 | Ht 67.0 in | Wt 182.0 lb

## 2023-02-04 DIAGNOSIS — R9389 Abnormal findings on diagnostic imaging of other specified body structures: Secondary | ICD-10-CM

## 2023-02-04 NOTE — Progress Notes (Signed)
Emily Abbott    [id number]    Feb 20, 1940  Primary Care Physician:White, Aram Beecham, MD  Referring Physician: [name], MD [phone] [place],  [place] [address]  Chief complaint:   History of COPD Breathing has been relatively stable since last visit  HPI:  Shortness of breath on exertion History of COPD History of lung nodule  Does get short of breath with activity  Had a repeat CT scan showing increase in size of the lung nodules  History of obstructive sleep apnea, was unable to tolerate CPAP therapy, does use oxygen sometimes at night  History of obstructive lung disease for which she has been using Trelegy Has not really noticed any significant changes with cough with use of Trelegy She feels her breathing has remained relatively stable   Has nasal stuffiness and congestion, cough with no sputum production Not feeling acutely ill Uses Flonase for nasal stuffiness  History of atrial fibrillation, currently in atrial flutter History of mitral valve repair  History of cardiomyopathy with recent echo showing normal ejection fraction, suboptimal to comment on pulmonary hypertension  Pulmonary function study from 2017 revealed very mild obstructive disease  Outpatient Encounter Medications as of 02/04/2023  Medication Sig   albuterol (PROAIR HFA) 108 (90 Base) MCG/ACT inhaler Inhale 1-2 puffs into the lungs every 6 (six) hours as needed for wheezing or shortness of breath.   albuterol (VENTOLIN HFA) 108 (90 Base) MCG/ACT inhaler INHALE 1-2 PUFFS BY MOUTH EVERY 6 HOURS AS NEEDED FOR WHEEZE OR SHORTNESS OF BREATH   ALPRAZolam (XANAX) 0.5 MG tablet Take 0.5 mg by mouth 2 (two) times daily as needed for anxiety or sleep.    Azelastine HCl 137 MCG/SPRAY SOLN Place 2 sprays into both nostrils at bedtime.   azithromycin (ZITHROMAX Z-PAK) 250 MG tablet Take 2 tablets day 1 and then 1 daily for 4 days   benzonatate (TESSALON) 200 MG capsule 1  capsule  three times a day for by mouth -   calcium carbonate (TUMS EX) 750 MG chewable tablet Chew 2 tablets by mouth as needed for heartburn.   cholecalciferol (VITAMIN D3) 25 MCG (1000 UNIT) tablet Take 1,000 Units by mouth daily.   diphenhydrAMINE (BENADRYL) 25 MG tablet Take 12.5 mg by mouth daily as needed for itching.   ELIQUIS 2.5 MG TABS tablet Take 2.5 mg by mouth 2 (two) times daily.   Empagliflozin-linaGLIPtin (GLYXAMBI) 25-5 MG TABS Take 1 tablet by mouth daily.   Evolocumab (REPATHA) 140 MG/ML SOSY Inject into the skin every 14 (fourteen) days.   ezetimibe (ZETIA) 10 MG tablet Take 10 mg by mouth daily.   fluticasone (FLONASE) 50 MCG/ACT nasal spray Place into both nostrils.   Fluticasone-Umeclidin-Vilant (TRELEGY ELLIPTA) 200-62.5-25 MCG/ACT AEPB Inhale 1 puff into the lungs daily.   furosemide (LASIX) 40 MG tablet Take 1 tablet (40 mg total) by mouth daily.   HYDROcodone-acetaminophen (NORCO/VICODIN) 5-325 MG tablet Take 0.5-1 tablets by mouth at bedtime.   Insulin Glargine (BASAGLAR KWIKPEN) 100 UNIT/ML Inject 14 Units into the skin daily.   ipratropium (ATROVENT) 0.06 % nasal spray Place into both nostrils.   ipratropium-albuterol (DUONEB) 0.5-2.5 (3) MG/3ML SOLN Take 3 mLs by nebulization every 6 (six) hours as needed.   magnesium gluconate (MAGONATE) 500 MG tablet Take 500 mg by mouth daily as needed (for leg cramps).    metoprolol succinate (TOPROL XL) 25 MG 24 hr tablet Take 1 tablet (25 mg total) by mouth  daily.   nystatin cream (MYCOSTATIN) 1 APPLICATION EXTERNALLY TWICE A DAY AS NEEDED FOR YEAST   omeprazole (PRILOSEC) 40 MG capsule Take 40 mg by mouth daily.     OXYGEN Inhale 1.5 L into the lungs at bedtime.    PARoxetine (PAXIL) 30 MG tablet Take 30 mg by mouth daily.     potassium chloride SA (KLOR-CON M) 20 MEQ tablet Take 1 tablet (20 mEq total) by mouth daily for 3 days.   predniSONE (DELTASONE) 20 MG tablet Take 1 tablet (20 mg total) by mouth daily with  breakfast.   tiZANidine (ZANAFLEX) 2 MG tablet Take 2 mg by mouth at bedtime as needed for muscle spasms.    triamcinolone (KENALOG) 0.1 % paste See Admin Instructions. PLEASE SEE ATTACHED FOR DETAILED DIRECTIONS   valACYclovir (VALTREX) 1000 MG tablet TAKE 1 TABLET BY MOUTH EVERY 12 HOURS AS NEEDED FOR COLD SORES   No facility-administered encounter medications on file as of 02/04/2023.    Allergies as of 02/04/2023 - Review Complete 09/24/2022  Allergen Reaction Noted   Metformin and related Other (See Comments) 06/14/2017   Adhesive [tape] Other (See Comments) 06/04/2017   Atorvastatin Other (See Comments) 06/17/2017   Sulfonamide derivatives Other (See Comments)     Past Medical History:  Diagnosis Date   Anemia    Anxiety    CAD (coronary artery disease)    Chronic kidney disease    CKD stage 3    COPD (chronic obstructive pulmonary disease) (HCC)    uses  1 to and1.5 L of o2 at bedtime ; has not seen her pulm doctor in over a year ; has not had to uses inhlaer in a over a year     Diabetes mellitus    type II   GERD (gastroesophageal reflux disease)    HTN (hypertension)    essential   Hyperlipidemia, mixed    Mitral valve disorder    Osteoarthritis    Panic attack    Paroxysmal atrial fibrillation (HCC)    Pneumonia    2010   Pulmonary embolism (HCC) 2010   Sleep apnea    does not uses cpap since she got a URI from it. has home 02 as needed    Past Surgical History:  Procedure Laterality Date   BIOPSY BREAST     BIV ICD GENERATOR CHANGEOUT N/A 06/06/2017   Procedure: BIV ICD GENERATOR CHANGEOUT;  Surgeon: Marinus Maw, MD;  Location: [facility] INVASIVE CV LAB;  Service: Cardiovascular;  Laterality: N/A;   CARDIAC VALVE SURGERY     mitral   carpal tennel  2003   EYE SURGERY  2015  or 2016   cataract surgery    PARATHYROIDECTOMY Left 06/17/2017   Procedure: LEFT UPPER PARATHYROIDECTOMY;  Surgeon: Berna Bue, MD;  Location: [facility];  Service: General;   Laterality: Left;   TUBAL LIGATION      over 60 years ago     Family History  Problem Relation Age of Onset   Stroke Mother    Anuerysm Father    Diabetes Sister    Cancer Brother    Ulcers Sister     Social History   Socioeconomic History   Marital status: Widowed    Spouse name: Not on file   Number of children: 4   Years of education: Not on file   Highest education level: 11th grade  Occupational History   Occupation: reitred  Tobacco Use   Smoking status: Former  Current packs/day: 0.00    Average packs/day: 1 pack/day for 50.0 years (50.0 ttl pk-yrs)    Types: Cigarettes    Start date: 02/20/1951    Quit date: 02/19/2001    Years since quitting: 21.9   Smokeless tobacco: Never  Vaping Use   Vaping status: Never Used  Substance and Sexual Activity   Alcohol use: Not Currently   Drug use: Never   Sexual activity: Not Currently  Other Topics Concern   Not on file  Social History Narrative   Not on file   Social Drivers of Health   Financial Resource Strain: Not on file  Food Insecurity: No Food Insecurity (05/11/2022)   Hunger Vital Sign    Worried About Running Out of Food in the Last Year: Never true    Ran Out of Food in the Last Year: Never true  Transportation Needs: No Transportation Needs (05/11/2022)   PRAPARE - Administrator, Civil Service (Medical): No    Lack of Transportation (Non-Medical): No  Physical Activity: Not on file  Stress: Not on file  Social Connections: Not on file  Intimate Partner Violence: Not At Risk (05/11/2022)   Humiliation, Afraid, Rape, and Kick questionnaire    Fear of Current or Ex-Partner: No    Emotionally Abused: No    Physically Abused: No    Sexually Abused: No    Review of Systems  Respiratory:  Positive for shortness of breath. Negative for cough.   Cardiovascular:  Negative for chest pain and palpitations.  Skin: Negative.   Psychiatric/Behavioral: Negative.  Negative for sleep disturbance.    All other systems reviewed and are negative.   There were no vitals filed for this visit.   Physical Exam Constitutional:      Appearance: She is obese.  HENT:     Head: Normocephalic and atraumatic.     Mouth/Throat:     Mouth: Mucous membranes are moist.  Eyes:     General:        Right eye: No discharge.        Left eye: No discharge.  Cardiovascular:     Pulses: Normal pulses.     Heart sounds: Normal heart sounds. No murmur heard.    No friction rub.  Musculoskeletal:        General: Normal range of motion.     Cervical back: No rigidity or tenderness.  Skin:    General: Skin is warm.  Neurological:     General: No focal deficit present.     Mental Status: She is alert.  Psychiatric:        Mood and Affect: Mood normal.    Data Reviewed: CT scan of the chest reviewed with the patient showing dominant right upper lobe nodule, peripheral right middle lobe nodule, left lower lobe nodule all unchanged from previous Previous CT also reviewed  Repeat CT scan shows increase in size of lung nodules, this was reviewed with the patient and her daughter  Echocardiogram from 2021 shows normal ejection fraction 55 to 60%, normal right ventricular size, right ventricular systolic function is normal Suboptimal for any mention of pulmonary hypertension  PFT seen-mild obstructive disease  PFT today was compared to previous PFT from 2017 and showed only mild reduction in FEV1 and FVC, no significant progression from 2017  Assessment:  Chronic obstructive pulmonary disease -No significant exacerbation impression -Will continue Trelegy 100 -She has not noticed any significant improvement in her symptoms with use of Trelegy, does  not feel it is helping   Most recent echocardiogram shows normal ejection fraction  History of atrial flutter-on anticoagulation  Osteoarthritis  History of multiple lung nodules Repeat CT shows increase in size of lung nodules    Plan/Recommendations: May discontinue Trelegy if no significant symptoms  Concern for lung nodules with increase in size, they were stable previously Did discuss bronchoscopy  Did discuss obtaining a PET scan to ascertain which of the nodules is more active  She is being scheduled for possible bronchoscopy with biopsy 02/24/2022  PET scan also requested  Encouraged to stay active  Encouraged to call with significant concerns  Encouraged to call with any significant concerns   Virl Diamond MD [facility] 02/04/2023, 1:09 PM  CC: [name], MD

## 2023-02-04 NOTE — Patient Instructions (Addendum)
We will schedule you for a PET scan  I will make arrangements to have you scheduled for a bronchoscopy on 02/24/2022  You may start the Trelegy if you have not noticed any significant changes  You can use Tessalon Perles to help with cough  Call with significant concerns

## 2023-02-05 ENCOUNTER — Encounter: Payer: Self-pay | Admitting: Pulmonary Disease

## 2023-02-06 ENCOUNTER — Other Ambulatory Visit (HOSPITAL_COMMUNITY): Payer: Self-pay

## 2023-02-07 ENCOUNTER — Other Ambulatory Visit (HOSPITAL_COMMUNITY): Payer: Self-pay

## 2023-02-08 ENCOUNTER — Other Ambulatory Visit (HOSPITAL_COMMUNITY): Payer: Self-pay

## 2023-02-11 ENCOUNTER — Other Ambulatory Visit (HOSPITAL_COMMUNITY): Payer: Self-pay

## 2023-02-14 ENCOUNTER — Other Ambulatory Visit (HOSPITAL_COMMUNITY): Payer: Self-pay

## 2023-02-15 ENCOUNTER — Encounter (HOSPITAL_COMMUNITY)
Admission: RE | Admit: 2023-02-15 | Discharge: 2023-02-15 | Disposition: A | Discharge status: Home / Self Care | Payer: PPO | Source: Ambulatory Visit | Attending: Pulmonary Disease | Admitting: Pulmonary Disease

## 2023-02-15 ENCOUNTER — Other Ambulatory Visit (HOSPITAL_COMMUNITY): Payer: Self-pay

## 2023-02-15 ENCOUNTER — Other Ambulatory Visit: Payer: Self-pay | Admitting: Pulmonary Disease

## 2023-02-15 DIAGNOSIS — R9389 Abnormal findings on diagnostic imaging of other specified body structures: Secondary | ICD-10-CM | POA: Insufficient documentation

## 2023-02-15 DIAGNOSIS — R911 Solitary pulmonary nodule: Secondary | ICD-10-CM | POA: Diagnosis present

## 2023-02-15 LAB — GLUCOSE, CAPILLARY: Glucose-Capillary: 177 mg/dL — ABNORMAL HIGH (ref 70–99)

## 2023-02-15 MED ORDER — FLUDEOXYGLUCOSE F - 18 (FDG) INJECTION
9.0800 | Freq: Once | INTRAVENOUS | Status: AC
  Administered 2023-02-15: 9.08 via INTRAVENOUS

## 2023-02-19 ENCOUNTER — Encounter (HOSPITAL_COMMUNITY): Payer: Self-pay | Admitting: Emergency Medicine

## 2023-02-21 ENCOUNTER — Other Ambulatory Visit: Payer: Self-pay

## 2023-02-21 ENCOUNTER — Encounter: Payer: Self-pay | Admitting: Internal Medicine

## 2023-02-21 ENCOUNTER — Encounter (HOSPITAL_COMMUNITY): Payer: Self-pay | Admitting: Emergency Medicine

## 2023-02-21 NOTE — Progress Notes (Signed)
 PERIOPERATIVE PRESCRIPTION FOR IMPLANTED CARDIAC DEVICE PROGRAMMING  Patient Information: Name:  Emily Abbott  DOB:  February 14, 1941  MRN:  [id number]    Planned Procedure:  Robotic Assisted Navigational Bronchoscopy  Surgeon:  Dr Lamar Chris  Date of Procedure:  02/25/23  Cautery will be used.  Position during surgery:  Supine   Please send documentation back to:  [facility] (Fax # [phone])  Device Information:  Clinic EP Physician:  Danelle Birmingham, MD   Device Type:  Pacemaker Manufacturer and Phone #:  [employer]/Abbott: [phone] Pacemaker Dependent?:  Unknown Date of Last Device Check:  12/28/22 Normal Device Function?:  Yes.    Electrophysiologist's Recommendations:  Have magnet available. Provide continuous ECG monitoring when magnet is used or reprogramming is to be performed.  Procedure may interfere with device function.  Magnet should be placed over device during procedure.  Per Device Clinic Standing Orders, Emily ONEIDA Shutter, RN  3:22 PM 02/21/2023

## 2023-02-21 NOTE — Progress Notes (Signed)
 PCP - Dr Montie Pizza Cardiologist/EP - Dr Danelle Birmingham  CT Chest x-ray - 12/19/22, Chest x-ray - 06/08/22 EKG - 06/08/22 Stress Test - 2007 but in 2010 systolic heart failure with ejection fraction 35% and had bypass surgery in [place]  ECHO - 05/11/22 Cardiac Cath - n/a  ICD Pacemaker - St Jude pacemaker.  Last remote transmission was on 12/28/22. Normal device function.   Device orders requested.  Rep. Redell Regal made aware via phone.  Sleep Study -  Yes, 01/2015 CPAP - Does not use CPAP, uses 1.5L oxygen  at home prn  Diabetes Type 2 Last dose of GLP2 Empagliflozin-LinaGliptin  will be on 02/21/23   GLP1 instructions:   THE MORNING OF SURGERY, take 9 units of Insulin  Glargine.    If your blood sugar is less than 70 mg/dL, you will need to treat for low blood sugar: Treat a low blood sugar (less than 70 mg/dL) with  cup of clear juice (cranberry or apple), 4 glucose tablets, OR glucose gel. Recheck blood sugar in 15 minutes after treatment (to make sure it is greater than 70 mg/dL). If your blood sugar is not greater than 70 mg/dL on recheck, call [phone] for further instructions.  Blood Thinner Instructions:  Follow your surgeon's instructions on when to stop Eliquis  prior to surgery.  If no instructions were given by your surgeon then you will need to call the office for those instructions.  Last dose of Eliquis  will be on 02/21/23.  NPO   Anesthesia review: Yes  STOP now taking any Aspirin (unless otherwise instructed by your surgeon), Aleve, Naproxen, Ibuprofen, Motrin, Advil, Goody's, BC's, all herbal medications, fish oil, and all vitamins.   Coronavirus Screening Do you have any of the following symptoms:  Cough yes/no: No Fever (>100.28F)  yes/no: No Runny nose yes/no: No Sore throat yes/no: No Difficulty breathing/shortness of breath  yes/no: Yes with exertion  Have you traveled in the last 14 days and where? yes/no: No  Patient verbalized understanding of  instructions that were given via phone.

## 2023-02-21 NOTE — Progress Notes (Addendum)
 Erroneous encounter

## 2023-02-22 NOTE — Progress Notes (Signed)
 Anesthesia Chart Review: Same day workup  83 year old female follows with cardiology for history of cardiomyopathy, left bundle branch block, s/p Saint Jude PPM, HFrEF (EF 40 to 45% by echo 04/2022), paroxysmal A-fib on Eliquis , TIA 04/2022, PE, s/p MV repair 2010 with LAA ligation and MAZE, HTN, HLD.  Per notes, no CAD by cath 2010.  Last seen in the EP clinic by Charlies Arthur, PA-C 08/07/2022.  ICD function normal at that time, no changes made.  She was noted to be having difficulty with orthostatic hypotension.  Her Coreg  was changed to Toprol  and her Lasix  was reduced from twice daily to once daily.  Follows with pulmonology for history of COPD, OSA (unable to tolerate CPAP, uses nocturnal O2), pulmonary nodules.  Last seen by Dr. Milagros on 02/04/2023.  Per note, PFTs done at that time showed only mild reduction in FEV1 and FVC, no significant progression from 2017.  Recent CT was noted to have shown increase in size of lung nodules.  She was recommended to undergo PET scan and bronchoscopy with biopsy.  Pt will need DOS labs and eval.   EKG 06/08/22: Sinus rhythm. Rate 77. Atrial premature complex. Left bundle branch block  Perioperative prescription for implanted cardiac device programming per progress note 02/21/2023: Device Information:   Clinic EP Physician:  Danelle Birmingham, MD    Device Type:  Pacemaker Manufacturer and Phone #:  St. Jude/Abbott: [phone] Pacemaker Dependent?:  Unknown Date of Last Device Check:  12/28/22         Normal Device Function?:  Yes.     Electrophysiologist's Recommendations:   Have magnet available. Provide continuous ECG monitoring when magnet is used or reprogramming is to be performed.  Procedure may interfere with device function.  Magnet should be placed over device during procedure.  TTE 05/11/2022: 1. Left ventricular ejection fraction, by estimation, is 40 to 45%. Left  ventricular ejection fraction by 2D MOD biplane is 43.3 %. The left   ventricle has mildly decreased function. The left ventricle demonstrates  global hypokinesis. Left ventricular  diastolic function could not be evaluated.   2. Right ventricular systolic function is mildly reduced. The right  ventricular size is normal.   3. Right atrial size was mildly dilated.   4. The mitral valve has been repaired/replaced. Trivial mitral valve  regurgitation. No evidence of mitral stenosis. There is a unknown size  prosthetic prosthetic annuloplasty ring present in the mitral position.   5. The aortic valve is tricuspid. There is mild calcification of the  aortic valve. Aortic valve regurgitation is not visualized. Aortic valve  sclerosis/calcification is present, without any evidence of aortic  stenosis. Aortic valve mean gradient  measures 10.0 mmHg.   6. The inferior vena cava is normal in size with greater than 50%  respiratory variability, suggesting right atrial pressure of 3 mmHg.   Comparison(s): Changes from prior study are noted. 07/06/2021: LVEF 60-65%.     Lynwood Geofm RIGGERS [facility] Stay [facility]/Anesthesiology Phone [phone] 02/22/2023 12:13 PM

## 2023-02-22 NOTE — Anesthesia Preprocedure Evaluation (Addendum)
 Anesthesia Evaluation  Patient identified by MRN, date of birth, ID band Patient awake    Reviewed: Allergy & Precautions, NPO status , Patient's Chart, lab work & pertinent test results, reviewed documented beta blocker date and time   History of Anesthesia Complications (+) PONV and history of anesthetic complications  Airway Mallampati: III  TM Distance: >3 FB Neck ROM: Full    Dental  (+) Edentulous Upper, Edentulous Lower, Dental Advisory Given   Pulmonary shortness of breath and with exertion, sleep apnea (1.5 L O2 at night) and Oxygen  sleep apnea , COPD,  oxygen  dependent, former smoker, PE   Pulmonary exam normal breath sounds clear to auscultation       Cardiovascular hypertension, Pt. on home beta blockers and Pt. on medications + CAD  Normal cardiovascular exam+ dysrhythmias Atrial Fibrillation + pacemaker  Rhythm:Regular Rate:Normal     Neuro/Psych  PSYCHIATRIC DISORDERS Anxiety Depression    CVA, No Residual Symptoms    GI/Hepatic Neg liver ROS,GERD  ,,  Endo/Other  diabetes, Type 2, Insulin  Dependent    Renal/GU Renal InsufficiencyRenal diseaseLab Results      Component                Value               Date                      NA                       137                 02/25/2023                CL                       105                 02/25/2023                K                        3.5                 02/25/2023                CO2                      21 (L)              02/25/2023                BUN                      22                  02/25/2023                CREATININE               1.27 (H)            02/25/2023                GFRNONAA                 42 (L)  02/25/2023                CALCIUM                   9.3                 02/25/2023                PHOS                     4.5                 05/12/2022                ALBUMIN                  4.4                 07/28/2022                 GLUCOSE                  202 (H)             02/25/2023             negative genitourinary   Musculoskeletal  (+) Arthritis ,    Abdominal   Peds  Hematology negative hematology ROS (+)   Anesthesia Other Findings   Reproductive/Obstetrics                             Anesthesia Physical Anesthesia Plan  ASA: 3  Anesthesia Plan: General   Post-op Pain Management:    Induction: Intravenous  PONV Risk Score and Plan: 4 or greater and Dexamethasone , Ondansetron  and Treatment may vary due to age or medical condition  Airway Management Planned: Oral ETT  Additional Equipment:   Intra-op Plan:   Post-operative Plan: Extubation in OR  Informed Consent: I have reviewed the patients History and Physical, chart, labs and discussed the procedure including the risks, benefits and alternatives for the proposed anesthesia with the patient or authorized representative who has indicated his/her understanding and acceptance.     Dental advisory given  Plan Discussed with: CRNA  Anesthesia Plan Comments: (PAT note by Lynwood Hope, PA-C:  83 year old female follows with cardiology for history of cardiomyopathy, left bundle branch block, s/p Saint Jude PPM, HFrEF (EF 40 to 45% by echo 04/2022), paroxysmal A-fib on Eliquis , TIA 04/2022, PE, s/p MV repair 2010 with LAA ligation and MAZE, HTN, HLD.  Per notes, no CAD by cath 2010.  Last seen in the EP clinic by Charlies Arthur, PA-C 08/07/2022.  ICD function normal at that time, no changes made.  She was noted to be having difficulty with orthostatic hypotension.  Her Coreg  was changed to Toprol  and her Lasix  was reduced from twice daily to once daily.  Follows with pulmonology for history of COPD, OSA (unable to tolerate CPAP, uses nocturnal O2), pulmonary nodules.  Last seen by Dr. Milagros on 02/04/2023.  Per note, PFTs done at that time showed only mild reduction in FEV1 and FVC, no significant progression  from 2017.  Recent CT was noted to have shown increase in size of lung nodules.  She was recommended to undergo PET scan and bronchoscopy with biopsy.  Pt will need DOS labs and eval.   EKG 06/08/22: Sinus rhythm. Rate 77. Atrial premature complex. Left bundle branch  block  Perioperative prescription for implanted cardiac device programming per progress note 02/21/2023: Device Information:  Clinic EP Physician:  Danelle Birmingham, MD   Device Type:  Pacemaker Manufacturer and Phone #:  St. Jude/Abbott: [phone] Pacemaker Dependent?:  Unknown Date of Last Device Check:  12/28/22         Normal Device Function?:  Yes.    Electrophysiologist's Recommendations:   Have magnet available.  Provide continuous ECG monitoring when magnet is used or reprogramming is to be performed.   Procedure may interfere with device function.  Magnet should be placed over device during procedure.  TTE 05/11/2022: 1. Left ventricular ejection fraction, by estimation, is 40 to 45%. Left  ventricular ejection fraction by 2D MOD biplane is 43.3 %. The left  ventricle has mildly decreased function. The left ventricle demonstrates  global hypokinesis. Left ventricular  diastolic function could not be evaluated.  2. Right ventricular systolic function is mildly reduced. The right  ventricular size is normal.  3. Right atrial size was mildly dilated.  4. The mitral valve has been repaired/replaced. Trivial mitral valve  regurgitation. No evidence of mitral stenosis. There is a unknown size  prosthetic prosthetic annuloplasty ring present in the mitral position.  5. The aortic valve is tricuspid. There is mild calcification of the  aortic valve. Aortic valve regurgitation is not visualized. Aortic valve  sclerosis/calcification is present, without any evidence of aortic  stenosis. Aortic valve mean gradient  measures 10.0 mmHg.  6. The inferior vena cava is normal in size with greater than 50%   respiratory variability, suggesting right atrial pressure of 3 mmHg.   Comparison(s): Changes from prior study are noted. 07/06/2021: LVEF 60-65%.    )        Anesthesia Quick Evaluation

## 2023-02-25 ENCOUNTER — Ambulatory Visit (HOSPITAL_COMMUNITY): Payer: Self-pay | Admitting: Physician Assistant

## 2023-02-25 ENCOUNTER — Ambulatory Visit (HOSPITAL_BASED_OUTPATIENT_CLINIC_OR_DEPARTMENT_OTHER): Payer: PPO | Admitting: Physician Assistant

## 2023-02-25 ENCOUNTER — Encounter (HOSPITAL_COMMUNITY): Payer: Self-pay | Admitting: Emergency Medicine

## 2023-02-25 ENCOUNTER — Ambulatory Visit (HOSPITAL_COMMUNITY): Payer: PPO

## 2023-02-25 ENCOUNTER — Encounter (HOSPITAL_COMMUNITY)
Admission: RE | Disposition: A | Discharge status: Home / Self Care | Payer: Self-pay | Source: Home / Self Care | Attending: Emergency Medicine

## 2023-02-25 ENCOUNTER — Other Ambulatory Visit: Payer: Self-pay

## 2023-02-25 ENCOUNTER — Ambulatory Visit (HOSPITAL_COMMUNITY)
Admission: RE | Admit: 2023-02-25 | Discharge: 2023-02-25 | Disposition: A | Discharge status: Home / Self Care | Payer: PPO | Attending: Emergency Medicine | Admitting: Emergency Medicine

## 2023-02-25 DIAGNOSIS — Z87891 Personal history of nicotine dependence: Secondary | ICD-10-CM

## 2023-02-25 DIAGNOSIS — E1122 Type 2 diabetes mellitus with diabetic chronic kidney disease: Secondary | ICD-10-CM

## 2023-02-25 DIAGNOSIS — I5022 Chronic systolic (congestive) heart failure: Secondary | ICD-10-CM | POA: Insufficient documentation

## 2023-02-25 DIAGNOSIS — I129 Hypertensive chronic kidney disease with stage 1 through stage 4 chronic kidney disease, or unspecified chronic kidney disease: Secondary | ICD-10-CM | POA: Diagnosis not present

## 2023-02-25 DIAGNOSIS — Z8673 Personal history of transient ischemic attack (TIA), and cerebral infarction without residual deficits: Secondary | ICD-10-CM | POA: Insufficient documentation

## 2023-02-25 DIAGNOSIS — N1832 Chronic kidney disease, stage 3b: Secondary | ICD-10-CM

## 2023-02-25 DIAGNOSIS — F32A Depression, unspecified: Secondary | ICD-10-CM | POA: Diagnosis not present

## 2023-02-25 DIAGNOSIS — E782 Mixed hyperlipidemia: Secondary | ICD-10-CM | POA: Diagnosis not present

## 2023-02-25 DIAGNOSIS — N183 Chronic kidney disease, stage 3 unspecified: Secondary | ICD-10-CM | POA: Insufficient documentation

## 2023-02-25 DIAGNOSIS — C3411 Malignant neoplasm of upper lobe, right bronchus or lung: Secondary | ICD-10-CM

## 2023-02-25 DIAGNOSIS — I2510 Atherosclerotic heart disease of native coronary artery without angina pectoris: Secondary | ICD-10-CM | POA: Diagnosis not present

## 2023-02-25 DIAGNOSIS — I480 Paroxysmal atrial fibrillation: Secondary | ICD-10-CM | POA: Insufficient documentation

## 2023-02-25 DIAGNOSIS — R0981 Nasal congestion: Secondary | ICD-10-CM | POA: Insufficient documentation

## 2023-02-25 DIAGNOSIS — I447 Left bundle-branch block, unspecified: Secondary | ICD-10-CM | POA: Insufficient documentation

## 2023-02-25 DIAGNOSIS — Z794 Long term (current) use of insulin: Secondary | ICD-10-CM | POA: Insufficient documentation

## 2023-02-25 DIAGNOSIS — Z9981 Dependence on supplemental oxygen: Secondary | ICD-10-CM | POA: Diagnosis not present

## 2023-02-25 DIAGNOSIS — I130 Hypertensive heart and chronic kidney disease with heart failure and stage 1 through stage 4 chronic kidney disease, or unspecified chronic kidney disease: Secondary | ICD-10-CM | POA: Insufficient documentation

## 2023-02-25 DIAGNOSIS — J449 Chronic obstructive pulmonary disease, unspecified: Secondary | ICD-10-CM | POA: Insufficient documentation

## 2023-02-25 DIAGNOSIS — Z7901 Long term (current) use of anticoagulants: Secondary | ICD-10-CM | POA: Insufficient documentation

## 2023-02-25 DIAGNOSIS — I4892 Unspecified atrial flutter: Secondary | ICD-10-CM | POA: Diagnosis not present

## 2023-02-25 DIAGNOSIS — Z86711 Personal history of pulmonary embolism: Secondary | ICD-10-CM | POA: Insufficient documentation

## 2023-02-25 DIAGNOSIS — F419 Anxiety disorder, unspecified: Secondary | ICD-10-CM | POA: Diagnosis not present

## 2023-02-25 DIAGNOSIS — G4733 Obstructive sleep apnea (adult) (pediatric): Secondary | ICD-10-CM | POA: Diagnosis not present

## 2023-02-25 DIAGNOSIS — R918 Other nonspecific abnormal finding of lung field: Secondary | ICD-10-CM | POA: Diagnosis not present

## 2023-02-25 DIAGNOSIS — I429 Cardiomyopathy, unspecified: Secondary | ICD-10-CM | POA: Diagnosis not present

## 2023-02-25 DIAGNOSIS — Z950 Presence of cardiac pacemaker: Secondary | ICD-10-CM | POA: Diagnosis not present

## 2023-02-25 DIAGNOSIS — R911 Solitary pulmonary nodule: Secondary | ICD-10-CM | POA: Diagnosis present

## 2023-02-25 DIAGNOSIS — K219 Gastro-esophageal reflux disease without esophagitis: Secondary | ICD-10-CM | POA: Diagnosis not present

## 2023-02-25 DIAGNOSIS — M1990 Unspecified osteoarthritis, unspecified site: Secondary | ICD-10-CM | POA: Insufficient documentation

## 2023-02-25 DIAGNOSIS — Z79899 Other long term (current) drug therapy: Secondary | ICD-10-CM | POA: Insufficient documentation

## 2023-02-25 DIAGNOSIS — Z7984 Long term (current) use of oral hypoglycemic drugs: Secondary | ICD-10-CM | POA: Insufficient documentation

## 2023-02-25 HISTORY — DX: Dyspnea, unspecified: R06.00

## 2023-02-25 HISTORY — PX: BRONCHIAL NEEDLE ASPIRATION BIOPSY: SHX5106

## 2023-02-25 HISTORY — PX: BRONCHIAL BIOPSY: SHX5109

## 2023-02-25 HISTORY — DX: Personal history of urinary calculi: Z87.442

## 2023-02-25 HISTORY — DX: Depression, unspecified: F32.A

## 2023-02-25 HISTORY — PX: VIDEO BRONCHOSCOPY WITH RADIAL ENDOBRONCHIAL ULTRASOUND: SHX6849

## 2023-02-25 HISTORY — DX: Other specified postprocedural states: Z98.890

## 2023-02-25 HISTORY — DX: Nausea with vomiting, unspecified: R11.2

## 2023-02-25 HISTORY — PX: BRONCHIAL WASHINGS: SHX5105

## 2023-02-25 HISTORY — DX: Presence of cardiac pacemaker: Z95.0

## 2023-02-25 HISTORY — PX: BRONCHIAL BRUSHINGS: SHX5108

## 2023-02-25 LAB — BASIC METABOLIC PANEL
Anion gap: 11 (ref 5–15)
BUN: 22 mg/dL (ref 8–23)
CO2: 21 mmol/L — ABNORMAL LOW (ref 22–32)
Calcium: 9.3 mg/dL (ref 8.9–10.3)
Chloride: 105 mmol/L (ref 98–111)
Creatinine, Ser: 1.27 mg/dL — ABNORMAL HIGH (ref 0.44–1.00)
GFR, Estimated: 42 mL/min — ABNORMAL LOW (ref >60)
Glucose, Bld: 202 mg/dL — ABNORMAL HIGH (ref 70–99)
Potassium: 3.5 mmol/L (ref 3.5–5.1)
Sodium: 137 mmol/L (ref 135–145)

## 2023-02-25 LAB — GLUCOSE, CAPILLARY
Glucose-Capillary: 184 mg/dL — ABNORMAL HIGH (ref 70–99)
Glucose-Capillary: 159 mg/dL — ABNORMAL HIGH (ref 70–99)
Glucose-Capillary: 151 mg/dL — ABNORMAL HIGH (ref 70–99)

## 2023-02-25 LAB — CBC
HCT: 37.2 % (ref 36.0–46.0)
Hemoglobin: 11.5 g/dL — ABNORMAL LOW (ref 12.0–15.0)
MCH: 24 pg — ABNORMAL LOW (ref 26.0–34.0)
MCHC: 30.9 g/dL (ref 30.0–36.0)
MCV: 77.7 fL — ABNORMAL LOW (ref 80.0–100.0)
Platelets: 267 10*3/uL (ref 150–400)
RBC: 4.79 MIL/uL (ref 3.87–5.11)
RDW: 17.2 % — ABNORMAL HIGH (ref 11.5–15.5)
WBC: 11.2 10*3/uL — ABNORMAL HIGH (ref 4.0–10.5)
nRBC: 0 % (ref 0.0–0.2)

## 2023-02-25 SURGERY — BRONCHOSCOPY, WITH BIOPSY USING ELECTROMAGNETIC NAVIGATION
Anesthesia: General | Laterality: Right

## 2023-02-25 MED ORDER — LABETALOL HCL 5 MG/ML IV SOLN
5.0000 mg | Freq: Once | INTRAVENOUS | Status: AC
  Administered 2023-02-25: 5 mg via INTRAVENOUS

## 2023-02-25 MED ORDER — PHENYLEPHRINE HCL-NACL 20-0.9 MG/250ML-% IV SOLN
INTRAVENOUS | Status: DC | PRN
  Administered 2023-02-25: 20 ug/min via INTRAVENOUS

## 2023-02-25 MED ORDER — LACTATED RINGERS IV SOLN: INTRAVENOUS | Status: DC

## 2023-02-25 MED ORDER — INSULIN ASPART 100 UNIT/ML IJ SOLN
0.0000 [IU] | INTRAMUSCULAR | Status: DC | PRN
Start: 2023-02-25 — End: 2023-02-25
  Administered 2023-02-25: 2 [IU] via SUBCUTANEOUS
  Filled 2023-02-25: qty 1
  Filled 2023-02-25: qty 0.07

## 2023-02-25 MED ORDER — FENTANYL CITRATE (PF) 250 MCG/5ML IJ SOLN
INTRAMUSCULAR | Status: DC | PRN
  Administered 2023-02-25: 25 ug via INTRAVENOUS

## 2023-02-25 MED ORDER — CHLORHEXIDINE GLUCONATE 0.12 % MT SOLN
OROMUCOSAL | Status: AC
  Administered 2023-02-25: 15 mL
  Filled 2023-02-25: qty 15

## 2023-02-25 MED ORDER — PHENYLEPHRINE 80 MCG/ML (10ML) SYRINGE FOR IV PUSH (FOR BLOOD PRESSURE SUPPORT)
PREFILLED_SYRINGE | INTRAVENOUS | Status: DC | PRN
  Administered 2023-02-25: 80 ug via INTRAVENOUS

## 2023-02-25 MED ORDER — PROPOFOL 500 MG/50ML IV EMUL
INTRAVENOUS | Status: DC | PRN
  Administered 2023-02-25: 200 ug/kg/min via INTRAVENOUS

## 2023-02-25 MED ORDER — PROPOFOL 10 MG/ML IV BOLUS
INTRAVENOUS | Status: DC | PRN
  Administered 2023-02-25: 60 mg via INTRAVENOUS

## 2023-02-25 MED ORDER — DEXAMETHASONE SODIUM PHOSPHATE 10 MG/ML IJ SOLN
INTRAMUSCULAR | Status: DC | PRN
  Administered 2023-02-25: 4 mg via INTRAVENOUS

## 2023-02-25 MED ORDER — ROCURONIUM BROMIDE 10 MG/ML (PF) SYRINGE
PREFILLED_SYRINGE | INTRAVENOUS | Status: DC | PRN
  Administered 2023-02-25: 20 mg via INTRAVENOUS
  Administered 2023-02-25: 50 mg via INTRAVENOUS

## 2023-02-25 MED ORDER — LIDOCAINE 2% (20 MG/ML) 5 ML SYRINGE
INTRAMUSCULAR | Status: DC | PRN
  Administered 2023-02-25: 40 mg via INTRAVENOUS

## 2023-02-25 MED ORDER — SUGAMMADEX SODIUM 200 MG/2ML IV SOLN
INTRAVENOUS | Status: DC | PRN
  Administered 2023-02-25: 200 mg via INTRAVENOUS

## 2023-02-25 MED ORDER — FENTANYL CITRATE (PF) 100 MCG/2ML IJ SOLN
INTRAMUSCULAR | Status: AC
  Filled 2023-02-25: qty 2

## 2023-02-25 MED ORDER — LABETALOL HCL 5 MG/ML IV SOLN
INTRAVENOUS | Status: AC
  Filled 2023-02-25: qty 4

## 2023-02-25 MED ORDER — ONDANSETRON HCL 4 MG/2ML IJ SOLN
INTRAMUSCULAR | Status: DC | PRN
  Administered 2023-02-25: 4 mg via INTRAVENOUS

## 2023-02-25 NOTE — Interval H&P Note (Signed)
 History and Physical Interval Note:  02/25/2023 7:25 AM  Emily Abbott  has presented today for surgery, with the diagnosis of LUNG NODULE RIGHT.  The various methods of treatment have been discussed with the patient and family. After consideration of risks, benefits and other options for treatment, the patient has consented to  Procedure(s): ROBOTIC ASSISTED NAVIGATIONAL BRONCHOSCOPY (Right) as a surgical intervention.  The patient's history has been reviewed, patient examined, no change in status, stable for surgery.  I have reviewed the patient's chart and labs.  Questions were answered to the patient's satisfaction.     Lamar GORMAN Chris

## 2023-02-25 NOTE — Anesthesia Procedure Notes (Signed)
 Procedure Name: Intubation Date/Time: 02/25/2023 7:49 AM  Performed by: Mannie Krystal LABOR, CRNAPre-anesthesia Checklist: Patient identified, Emergency Drugs available, Suction available and Patient being monitored Patient Re-evaluated:Patient Re-evaluated prior to induction Oxygen  Delivery Method: Circle system utilized Preoxygenation: Pre-oxygenation with 100% oxygen  Induction Type: IV induction Ventilation: Mask ventilation without difficulty Laryngoscope Size: Miller and 2 Grade View: Grade I Tube type: Oral Tube size: 8.5 mm Number of attempts: 1 Airway Equipment and Method: Stylet and Oral airway Placement Confirmation: ETT inserted through vocal cords under direct vision, positive ETCO2 and breath sounds checked- equal and bilateral Secured at: 22 cm Tube secured with: Tape Dental Injury: Teeth and Oropharynx as per pre-operative assessment

## 2023-02-25 NOTE — Transfer of Care (Signed)
 Immediate Anesthesia Transfer of Care Note  Patient: Emily Abbott  Procedure(s) Performed: ROBOTIC ASSISTED NAVIGATIONAL BRONCHOSCOPY (Right) BRONCHIAL BIOPSIES BRONCHIAL BRUSHINGS BRONCHIAL NEEDLE ASPIRATION BIOPSIES BRONCHIAL WASHINGS VIDEO BRONCHOSCOPY WITH RADIAL ENDOBRONCHIAL ULTRASOUND  Patient Location: PACU  Anesthesia Type:General  Level of Consciousness: drowsy  Airway & Oxygen  Therapy: Patient Spontanous Breathing and Patient connected to face mask oxygen   Post-op Assessment: Report given to RN and Post -op Vital signs reviewed and stable  Post vital signs: Reviewed and stable  Last Vitals:  Vitals Value Taken Time  BP 158/75 02/25/23 0905  Temp 36.6 C 02/25/23 0905  Pulse 71 02/25/23 0912  Resp 17 02/25/23 0912  SpO2 96 % 02/25/23 0912  Vitals shown include unfiled device data.  Last Pain:  Vitals:   02/25/23 0905  TempSrc:   PainSc: Asleep         Complications: No notable events documented.

## 2023-02-25 NOTE — Anesthesia Postprocedure Evaluation (Signed)
 Anesthesia Post Note  Patient: Emily Abbott  Procedure(s) Performed: ROBOTIC ASSISTED NAVIGATIONAL BRONCHOSCOPY (Right) BRONCHIAL BIOPSIES BRONCHIAL BRUSHINGS BRONCHIAL NEEDLE ASPIRATION BIOPSIES BRONCHIAL WASHINGS VIDEO BRONCHOSCOPY WITH RADIAL ENDOBRONCHIAL ULTRASOUND     Patient location during evaluation: PACU Anesthesia Type: General Level of consciousness: awake and alert Pain management: pain level controlled Vital Signs Assessment: post-procedure vital signs reviewed and stable Respiratory status: spontaneous breathing, nonlabored ventilation, respiratory function stable and patient connected to nasal cannula oxygen  Cardiovascular status: blood pressure returned to baseline and stable Postop Assessment: no apparent nausea or vomiting Anesthetic complications: no  No notable events documented.  Last Vitals:  Vitals:   02/25/23 0945 02/25/23 0946  BP: (!) 154/97   Pulse: 70   Resp: 12   Temp: 36.6 C   SpO2: (!) 88% 90%    Last Pain:  Vitals:   02/25/23 0930  TempSrc:   PainSc: 0-No pain                 Dameer Speiser L Earnie Bechard

## 2023-02-25 NOTE — Discharge Instructions (Addendum)
 Flexible Bronchoscopy, Care After This sheet gives you information about how to care for yourself after your test. Your doctor may also give you more specific instructions. If you have problems or questions, contact your doctor. Follow these instructions at home: Eating and drinking When your numbness is gone and your cough and gag reflexes have come back, you may: Eat only soft foods. Slowly drink liquids. When you get home from the test, go back to your normal diet. Driving Do not drive for 24 hours if you were given a medicine to help you relax (sedative). Do not drive or use heavy machinery while taking prescription pain medicine. General instructions  Take over-the-counter and prescription medicines only as told by your doctor. Return to your normal activities as told. Ask what activities are safe for you. Do not use any products that have nicotine or tobacco in them. This includes cigarettes and e-cigarettes. If you need help quitting, ask your doctor. Keep all follow-up visits as told by your doctor. This is important. It is very important if you had a tissue sample (biopsy) taken. Get help right away if: You have shortness of breath that gets worse. You get light-headed. You feel like you are going to pass out (faint). You have chest pain. You cough up: More than a little blood. More blood than before. Summary Do not eat or drink anything (not even water) for 2 hours after your test, or until your numbing medicine wears off. Do not use cigarettes. Do not use e-cigarettes. Get help right away if you have chest pain.  Please call our office for any questions or concerns.  [phone].  Okay to restart your Eliquis  on 02/26/2023  This information is not intended to replace advice given to you by your health care provider. Make sure you discuss any questions you have with your health care provider. Document Released: 12/03/2008 Document Revised: 01/18/2017 Document Reviewed:  02/24/2016 Elsevier Patient Education  2020 [employer].

## 2023-02-25 NOTE — Op Note (Signed)
 Video Bronchoscopy with Robotic Assisted Bronchoscopic Navigation   Date of Operation: 02/25/2023   Pre-op Diagnosis: Enlarging right upper lobe pulmonary nodule  Post-op Diagnosis: Same  Surgeon: Lamar Chris  Assistants: None  Anesthesia: General endotracheal anesthesia  Operation: Flexible video fiberoptic bronchoscopy with robotic assistance and biopsies.  Estimated Blood Loss: Minimal  Complications: None  Indications and History: Emily Abbott is a 83 y.o. female with history of tobacco use.  Followed with serial imaging for pulmonary nodules.  She has a stable right upper lobe nodule, groundglass left upper lobe nodule but enlarging right upper lobe nodule concerning for possible malignancy.  Recommendation made to achieve tissue diagnosis via robotic assisted navigational bronchoscopy. The risks, benefits, complications, treatment options and expected outcomes were discussed with the patient.  The possibilities of pneumothorax, pneumonia, reaction to medication, pulmonary aspiration, perforation of a viscus, bleeding, failure to diagnose a condition and creating a complication requiring transfusion or operation were discussed with the patient who freely signed the consent.    Description of Procedure: The patient was seen in the Preoperative Area, was examined and was deemed appropriate to proceed.  The patient was taken to [facility] endoscopy room 3, identified as Emily Abbott and the procedure verified as Flexible Video Fiberoptic Bronchoscopy.  A Time Out was held and the above information confirmed.   Prior to the date of the procedure a high-resolution CT scan of the chest was performed. Utilizing ION software program a virtual tracheobronchial tree was generated to allow the creation of distinct navigation pathways to the patient's parenchymal abnormalities. After being taken to the operating room general anesthesia was initiated and the patient  was orally intubated. The video  fiberoptic bronchoscope was introduced via the endotracheal tube and a general inspection was performed which showed normal right and left lung anatomy. Aspiration of the bilateral mainstems was completed to remove any remaining secretions. Robotic catheter inserted into patient's endotracheal tube.   Target #1 enlarging right upper lobe pulmonary nodule: The distinct navigation pathways prepared prior to this procedure were then utilized to navigate to patient's lesion identified on CT scan.  There was a smooth white endobronchial lesion noted at the target site consistent with malignancy.  The robotic catheter was secured into place and the vision probe was withdrawn.  Lesion location was approximated using fluoroscopy and radial endobronchial ultrasound for peripheral targeting. Under fluoroscopic guidance transbronchial brushings, transbronchial needle biopsies, and transbronchial forceps biopsies were performed to be sent for cytology and pathology.  Needle in lesion was established using Cios three-dimensional imaging.  A bronchioalveolar lavage was performed in the right upper lobe and sent for cytology.  At the end of the procedure a general airway inspection was performed and there was no evidence of active bleeding. The bronchoscope was removed.  The patient tolerated the procedure well. There was no significant blood loss and there were no obvious complications. A post-procedural chest x-ray is pending.  Samples Target #1: 1. Transbronchial brushings from right upper lobe pulmonary nodule 2. Transbronchial Wang needle biopsies from right upper lobe pulmonary nodule 3. Transbronchial forceps biopsies from right upper lobe pulmonary nodule 4. Bronchoalveolar lavage from right upper lobe   Plans:  The patient will be discharged from the PACU to home when recovered from anesthesia and after chest x-ray is reviewed. We will review the cytology, pathology and microbiology results with the patient  when they become available. Outpatient followup will be with Dr. Neda and Dr. Chris.  Lamar Chris, MD, PhD  02/25/2023, 8:56 AM [facility] [phone] or if no answer before 7:00PM call [phone] For any issues after 7:00PM please call eLink [phone]

## 2023-02-26 LAB — CYTOLOGY - NON PAP

## 2023-02-27 ENCOUNTER — Encounter (HOSPITAL_COMMUNITY): Payer: Self-pay | Admitting: Emergency Medicine

## 2023-03-01 ENCOUNTER — Telehealth: Payer: Self-pay | Admitting: Emergency Medicine

## 2023-03-01 DIAGNOSIS — C3490 Malignant neoplasm of unspecified part of unspecified bronchus or lung: Secondary | ICD-10-CM

## 2023-03-01 NOTE — Telephone Encounter (Signed)
 Discussed bronchoscopy results with the patient.  Her transbronchial biopsy show squamous cell lung cancer.  Suspect that this is stage I disease.  She has a pacemaker so I will order a head CT with contrast.  Her PFTs from 2023 were acceptable but at her age unclear whether she will want surgery.  I will send her to thoracic oncology to discuss the options, either surgery or SBRT.

## 2023-03-04 ENCOUNTER — Telehealth: Payer: Self-pay | Admitting: Emergency Medicine

## 2023-03-04 NOTE — Telephone Encounter (Signed)
 I'll get the Ct Scan scheduled and call PT.

## 2023-03-04 NOTE — Telephone Encounter (Signed)
 Stanton Kidney daughter checking on order for CT scan. Debra phone number is [phone].

## 2023-03-07 ENCOUNTER — Other Ambulatory Visit: Payer: Self-pay

## 2023-03-07 ENCOUNTER — Telehealth: Payer: Self-pay | Admitting: Radiation Oncology

## 2023-03-07 ENCOUNTER — Ambulatory Visit
Admission: RE | Admit: 2023-03-07 | Discharge: 2023-03-07 | Disposition: A | Discharge status: Home / Self Care | Payer: PPO | Source: Ambulatory Visit | Attending: Emergency Medicine | Admitting: Emergency Medicine

## 2023-03-07 DIAGNOSIS — C3490 Malignant neoplasm of unspecified part of unspecified bronchus or lung: Secondary | ICD-10-CM

## 2023-03-07 MED ORDER — IOPAMIDOL (ISOVUE-370) INJECTION 76%
500.0000 mL | Freq: Once | INTRAVENOUS | Status: AC | PRN
  Administered 2023-03-07: 75 mL via INTRAVENOUS

## 2023-03-07 NOTE — Telephone Encounter (Signed)
1/16 @ 10:43 am Left voicemail for patient to call our office to be schedule for consult.

## 2023-03-09 NOTE — Progress Notes (Signed)
The proposed treatment discussed in conference is for discussion purpose only and is not a binding recommendation.  The patients have not been physically examined, or presented with their treatment options.  Therefore, final treatment plans cannot be decided.  

## 2023-03-12 ENCOUNTER — Telehealth: Payer: Self-pay | Admitting: Radiation Oncology

## 2023-03-12 NOTE — Telephone Encounter (Signed)
1/21 @ 4:12 pm Received voicemail from patient daughter about her upcoming appointments with Dr. Arbutus Ped.  Forward email to Dr. Asa Lente nurses, Alejandro Mulling and Vincent Peyer, so they are aware.

## 2023-03-15 NOTE — Progress Notes (Incomplete)
Location of tumor and Histology per Pathology Report: Right upper lobe  Biopsy:    Past/Anticipated interventions by surgeon, if any: right     Video Bronchoscopy with Robotic Assisted Bronchoscopic Navigation    Date of Operation: 02/25/2023    Pre-op Diagnosis: Enlarging right upper lobe pulmonary nodule        Past/Anticipated interventions by medical oncology, if any:     Pain issues, if any:  {:18581} {PAIN DESCRIPTION:21022940}  SAFETY ISSUES: Prior radiation? {:18581} Pacemaker/ICD? {:18581} Possible current pregnancy? no Is the patient on methotrexate? {:18581}  Current Complaints / other details:  ***     ***

## 2023-03-19 NOTE — Progress Notes (Signed)
Radiation Oncology         [phone] ________________________________  Initial {In/Out}patient Consultation  Name: Emily Abbott MRN: [id number]  Date: 03/20/2023  DOB: Jun 01, 1940  CC:[name], MD  Leslye Peer, MD   REFERRING PHYSICIAN: Leslye Peer, MD  DIAGNOSIS: There were no encounter diagnoses.  Poorly differentiated squamous cell carcinoma   HISTORY OF PRESENT ILLNESS::Emily Abbott is a 83 y.o. female who is accompanied by ***. she is seen as a courtesy of Dr. Levy Pupa for an opinion concerning radiation therapy as part of management for her recently diagnosed squamous lung cancer. The patient has a history of COPD obstructive lung disease and lung nodule. She presented for a routine CT chest on 12-19-23 which showed enlarging tubular nodule in the anterior segment right upper lobe measuring 1.5 x 2.0 cm in the greatest extend, compared to 0.6 x 1.8 cm in August 2024. Multiple additional scattered small solid pulmonary nodules along with a 12 mm ground-glass nodule in the left upper lobe are chronic and stable compared to previous exams. Interval resolution of previously seen nodular consolidation in the anterior segment left upper lobe was also indicated.   Subsequently, she was referred to Dr. Virl Diamond on 02-04-23 to discuss further treatment plan. At that time, she decided to complete a PET scan to ascertain which of the nodules is more active. They also discussed undergoing a bronchoscopy for diagnostic and staging purposes. She presented for a PET scan on 02-15-23 that showed a hypermetabolic right upper lobe nodule, measuring 1.3 x 2.1 cm in the greatest extend. Nodule is indicative of stage IA primary bronchogenic carcinoma. An enlarged pulmonic trunk, indicative of pulmonary arterial hypertension was also indicated.  She proceeded with a flexible video fiberoptic bronchoscopy with robotic assistance and biopsies on 02-25-23 under the care of Dr. Delton Coombes.  Surgical pathology indicated squamous cell lung cancer. She also underwent a DG chest on the same date which showed mild hazy airspace opacity in the right upper lobe adjacent to the nodule suspected to be post procedure hemorrhage. No pneumothorax was indicated on scan. Patient also presented for a CT chest on 03-07-23 that did not indicate any metastatic disease to the brain.   No other significant oncologic interval history.   The patient underwent a fine needle aspiration, transbronchial biopsy on 02-25-23 showing: Poorly differentiated squamous cell carcinoma   PREVIOUS RADIATION THERAPY: No  PAST MEDICAL HISTORY:  Past Medical History:  Diagnosis Date   Anemia    Anxiety    CAD (coronary artery disease)    Chronic kidney disease    CKD stage 3    COPD (chronic obstructive pulmonary disease) (HCC)    uses  1 to and1.5 L of o2 at bedtime ; has not seen her pulm doctor in over a year ; has not had to uses inhlaer in a over a year     Depression    Diabetes mellitus    type II   Dyspnea    with exertion; has oxygen at home if needed   GERD (gastroesophageal reflux disease)    History of kidney stones    present in kidney   HTN (hypertension)    essential   Hyperlipidemia, mixed    Mitral valve disorder    Osteoarthritis    Panic attack    Paroxysmal atrial fibrillation (HCC)    Pneumonia    2010   PONV (postoperative nausea and vomiting)    Presence of permanent cardiac pacemaker  St Jude   Pulmonary embolism (HCC) 2010   Sleep apnea    does not uses cpap since she got a URI from it. has home 02 1.5L qhs   Stroke [facility]) 04/2022    PAST SURGICAL HISTORY: Past Surgical History:  Procedure Laterality Date   BIOPSY BREAST Right    BIV ICD GENERATOR CHANGEOUT N/A 06/06/2017   Procedure: BIV ICD GENERATOR CHANGEOUT;  Surgeon: Marinus Maw, MD;  Location: [facility] INVASIVE CV LAB;  Service: Cardiovascular;  Laterality: N/A;   BRONCHIAL BIOPSY  02/25/2023   Procedure: BRONCHIAL  BIOPSIES;  Surgeon: Leslye Peer, MD;  Location: [facility] ENDOSCOPY;  Service: Pulmonary;;   BRONCHIAL BRUSHINGS  02/25/2023   Procedure: BRONCHIAL BRUSHINGS;  Surgeon: Leslye Peer, MD;  Location: [facility] ENDOSCOPY;  Service: Pulmonary;;   BRONCHIAL NEEDLE ASPIRATION BIOPSY  02/25/2023   Procedure: BRONCHIAL NEEDLE ASPIRATION BIOPSIES;  Surgeon: Leslye Peer, MD;  Location: [facility] ENDOSCOPY;  Service: Pulmonary;;   BRONCHIAL WASHINGS  02/25/2023   Procedure: BRONCHIAL WASHINGS;  Surgeon: Leslye Peer, MD;  Location: [facility] ENDOSCOPY;  Service: Pulmonary;;   CARDIAC VALVE SURGERY     mitral   carpal tennel Right 2003   COLONOSCOPY     EYE SURGERY Bilateral 2015  or 2016   cataract surgery    MULTIPLE TOOTH EXTRACTIONS     upper and lower dentures   PARATHYROIDECTOMY Left 06/17/2017   Procedure: LEFT UPPER PARATHYROIDECTOMY;  Surgeon: Berna Bue, MD;  Location: [facility] ORS;  Service: General;  Laterality: Left;   TUBAL LIGATION      over 60 years ago    VIDEO BRONCHOSCOPY WITH RADIAL ENDOBRONCHIAL ULTRASOUND  02/25/2023   Procedure: VIDEO BRONCHOSCOPY WITH RADIAL ENDOBRONCHIAL ULTRASOUND;  Surgeon: Leslye Peer, MD;  Location: [facility] ENDOSCOPY;  Service: Pulmonary;;    FAMILY HISTORY:  Family History  Problem Relation Age of Onset   Stroke Mother    Anuerysm Father    Diabetes Sister    Cancer Brother    Ulcers Sister     SOCIAL HISTORY:  Social History   Tobacco Use   Smoking status: Former    Current packs/day: 0.00    Average packs/day: 1 pack/day for 50.0 years (50.0 ttl pk-yrs)    Types: Cigarettes    Start date: 02/20/1951    Quit date: 02/19/2001    Years since quitting: 22.0   Smokeless tobacco: Never  Vaping Use   Vaping status: Never Used  Substance Use Topics   Alcohol use: Not Currently    Comment: occasional mixed drink 1 or 2 year   Drug use: Never    ALLERGIES:  Allergies  Allergen Reactions   Metformin And Related Other (See Comments)    "caused me kidney  problems"    Adhesive [Tape] Other (See Comments)    Burning sensation   Atorvastatin Other (See Comments)    Makes legs hurt    Sulfonamide Derivatives Other (See Comments)    itch    MEDICATIONS:  Current Outpatient Medications  Medication Sig Dispense Refill   albuterol (PROAIR HFA) 108 (90 Base) MCG/ACT inhaler Inhale 1-2 puffs into the lungs every 6 (six) hours as needed for wheezing or shortness of breath. 18 g 1   albuterol (VENTOLIN HFA) 108 (90 Base) MCG/ACT inhaler INHALE 1-2 PUFFS BY MOUTH EVERY 6 HOURS AS NEEDED FOR WHEEZE OR SHORTNESS OF BREATH 8.5 each 5   ALPRAZolam (XANAX) 0.5 MG tablet Take 0.5 mg by mouth 2 (two) times daily  as needed for anxiety or sleep.      calcium carbonate (TUMS EX) 750 MG chewable tablet Chew 2 tablets by mouth as needed for heartburn.     cholecalciferol (VITAMIN D3) 25 MCG (1000 UNIT) tablet Take 1,000 Units by mouth daily.     diphenhydrAMINE (BENADRYL) 25 MG tablet Take 12.5 mg by mouth daily as needed for itching.     ELIQUIS 2.5 MG TABS tablet Take 2.5 mg by mouth 2 (two) times daily.     Empagliflozin-linaGLIPtin (GLYXAMBI) 25-5 MG TABS Take 1 tablet by mouth daily.     Evolocumab (REPATHA) 140 MG/ML SOSY Inject into the skin every 14 (fourteen) days.     ezetimibe (ZETIA) 10 MG tablet Take 10 mg by mouth at bedtime.     fluticasone (FLONASE) 50 MCG/ACT nasal spray Place into both nostrils.     furosemide (LASIX) 40 MG tablet Take 1 tablet (40 mg total) by mouth daily. 90 tablet 2   HYDROcodone-acetaminophen (NORCO/VICODIN) 5-325 MG tablet Take 0.5-1 tablets by mouth at bedtime.     Insulin Glargine (BASAGLAR KWIKPEN) 100 UNIT/ML Inject 18 Units into the skin daily.     ipratropium (ATROVENT) 0.06 % nasal spray Place into both nostrils.     ipratropium-albuterol (DUONEB) 0.5-2.5 (3) MG/3ML SOLN Take 3 mLs by nebulization every 6 (six) hours as needed. 360 mL 2   magnesium gluconate (MAGONATE) 500 MG tablet Take 500 mg by mouth daily as  needed (for leg cramps).      metoprolol succinate (TOPROL XL) 25 MG 24 hr tablet Take 1 tablet (25 mg total) by mouth daily. 90 tablet 2   nystatin cream (MYCOSTATIN) 1 APPLICATION EXTERNALLY TWICE A DAY AS NEEDED FOR YEAST     omeprazole (PRILOSEC) 40 MG capsule Take 40 mg by mouth daily.       OXYGEN Inhale 1.5 L into the lungs at bedtime.      PARoxetine (PAXIL) 30 MG tablet Take 30 mg by mouth at bedtime.     tiZANidine (ZANAFLEX) 2 MG tablet Take 2 mg by mouth at bedtime as needed for muscle spasms.      valACYclovir (VALTREX) 1000 MG tablet TAKE 1 TABLET BY MOUTH EVERY 12 HOURS AS NEEDED FOR COLD SORES     No current facility-administered medications for this encounter.    REVIEW OF SYSTEMS:  A 10+ POINT REVIEW OF SYSTEMS WAS OBTAINED including neurology, dermatology, psychiatry, cardiac, respiratory, lymph, extremities, GI, GU, musculoskeletal, constitutional, reproductive, HEENT. ***   PHYSICAL EXAM:  vitals were not taken for this visit.   General: Alert and oriented, in no acute distress HEENT: Head is normocephalic. Extraocular movements are intact. Oropharynx is clear. Neck: Neck is supple, no palpable cervical or supraclavicular lymphadenopathy. Heart: Regular in rate and rhythm with no murmurs, rubs, or gallops. Chest: Clear to auscultation bilaterally, with no rhonchi, wheezes, or rales. Abdomen: Soft, nontender, nondistended, with no rigidity or guarding. Extremities: No cyanosis or edema. Lymphatics: see Neck Exam Skin: No concerning lesions. Musculoskeletal: symmetric strength and muscle tone throughout. Neurologic: Cranial nerves II through XII are grossly intact. No obvious focalities. Speech is fluent. Coordination is intact. Psychiatric: Judgment and insight are intact. Affect is appropriate. ***  ECOG = ***  0 - Asymptomatic (Fully active, able to carry on all predisease activities without restriction)  1 - Symptomatic but completely ambulatory (Restricted in  physically strenuous activity but ambulatory and able to carry out work of a light or sedentary nature. For example, light  housework, office work)  2 - Symptomatic, <50% in bed during the day (Ambulatory and capable of all self care but unable to carry out any work activities. Up and about more than 50% of waking hours)  3 - Symptomatic, >50% in bed, but not bedbound (Capable of only limited self-care, confined to bed or chair 50% or more of waking hours)  4 - Bedbound (Completely disabled. Cannot carry on any self-care. Totally confined to bed or chair)  5 - Death   Santiago Glad MM, Creech RH, Tormey DC, et al. [phone]). "Toxicity and response criteria of the [facility]". Am. Evlyn Clines. Oncol. 5 (6): 649-55  LABORATORY DATA:  Lab Results  Component Value Date   WBC 11.2 (H) 02/25/2023   HGB 11.5 (L) 02/25/2023   HCT 37.2 02/25/2023   MCV 77.7 (L) 02/25/2023   PLT 267 02/25/2023   NEUTROABS 9.0 (H) 07/28/2022   Lab Results  Component Value Date   NA 137 02/25/2023   K 3.5 02/25/2023   CL 105 02/25/2023   CO2 21 (L) 02/25/2023   GLUCOSE 202 (H) 02/25/2023   BUN 22 02/25/2023   CREATININE 1.27 (H) 02/25/2023   CALCIUM 9.3 02/25/2023      RADIOGRAPHY: CT HEAD W & WO CONTRAST ( ) Result Date: 03/15/2023 CLINICAL DATA:  Non-small cell lung cancer. EXAM: CT HEAD WITHOUT AND WITH CONTRAST TECHNIQUE: Contiguous axial images were obtained from the base of the skull through the vertex without and with intravenous contrast. RADIATION DOSE REDUCTION: This exam was performed according to the departmental dose-optimization program which includes automated exposure control, adjustment of the mA and/or kV according to patient size and/or use of iterative reconstruction technique. CONTRAST:  75mL ISOVUE-370 IOPAMIDOL (ISOVUE-370) INJECTION 76% COMPARISON:  Head CT 06/08/2022 FINDINGS: Brain: No evidence of acute infarction, hemorrhage, hydrocephalus, extra-axial collection or mass  lesion/mass effect. Chronic small vessel ischemic low-density in the cerebral white matter that is generalized. Mild for age cerebral volume loss. Vascular: Major vessels are enhancing Skull: Negative for bone lesion Sinuses/Orbits: No significant finding IMPRESSION: Negative for metastatic disease to the brain. Electronically Signed   By: Tiburcio Pea M.D.   On: 03/15/2023 09:30   DG Chest Port 1 View Result Date: 02/25/2023 CLINICAL DATA:  S/p bronchoscopy with biopsy.  History of COPD. EXAM: PORTABLE CHEST 1 VIEW COMPARISON:  12/03/2022. FINDINGS: Mild hazy opacity noted in the right upper lobe surrounding the previously noted nodule consistent with mild post procedure hemorrhage. No pneumothorax.  No convincing pleural effusion. Prominent interstitial markings, similar to the prior exam allowing for lower lung volumes and the semi-erect positioning. Stable changes from prior cardiac surgery and valve replacement. Left anterior chest wall biventricular cardioverter-defibrillator is also stable and well positioned. IMPRESSION: 1. Mild hazy airspace opacity in the right upper lobe adjacent to the nodule suspected to be post procedure hemorrhage. 2. No other evidence of a complication.  No pneumothorax. Electronically Signed   By: Amie Portland M.D.   On: 02/25/2023 09:47   DG C-ARM BRONCHOSCOPY Result Date: 02/25/2023 C-ARM BRONCHOSCOPY: Fluoroscopy was utilized by the requesting physician.  No radiographic interpretation.      IMPRESSION: Poorly differentiated squamous cell carcinoma   ***  Today, I talked to the patient and family about the findings and work-up thus far.  We discussed the natural history of *** and general treatment, highlighting the role of radiotherapy in the management.  We discussed the available radiation techniques, and focused on the details of logistics and  delivery.  We reviewed the anticipated acute and late sequelae associated with radiation in this setting.  The patient  was encouraged to ask questions that I answered to the best of my ability. *** A patient consent form was discussed and signed.  We retained a copy for our records.  The patient would like to proceed with radiation and will be scheduled for CT simulation.  PLAN: ***    *** minutes of total time was spent for this patient encounter, including preparation, face-to-face counseling with the patient and coordination of care, physical exam, and documentation of the encounter.   ------------------------------------------------  Billie Lade, PhD, MD This document serves as a record of services personally performed by Antony Blackbird, MD. It was created on his behalf by Herbie Saxon, a trained medical scribe. The creation of this record is based on the scribe's personal observations and the provider's statements to them. This document has been checked and approved by the attending provider.

## 2023-03-20 ENCOUNTER — Other Ambulatory Visit: Payer: Self-pay | Admitting: Physician Assistant

## 2023-03-20 ENCOUNTER — Ambulatory Visit
Admission: RE | Admit: 2023-03-20 | Discharge: 2023-03-20 | Disposition: A | Discharge status: Home / Self Care | Payer: PPO | Source: Ambulatory Visit | Attending: Radiation Oncology | Admitting: Radiation Oncology

## 2023-03-20 ENCOUNTER — Encounter: Payer: Self-pay | Admitting: Internal Medicine

## 2023-03-20 ENCOUNTER — Ambulatory Visit: Payer: PPO | Admitting: Internal Medicine

## 2023-03-20 ENCOUNTER — Ambulatory Visit: Payer: PPO

## 2023-03-20 ENCOUNTER — Ambulatory Visit: Payer: PPO | Admitting: Radiation Oncology

## 2023-03-20 ENCOUNTER — Other Ambulatory Visit: Payer: PPO

## 2023-03-20 ENCOUNTER — Encounter: Payer: Self-pay | Admitting: Radiation Oncology

## 2023-03-20 VITALS — BP 170/84 | HR 71 | Temp 97.9°F | Resp 20 | Ht 66.5 in | Wt 180.2 lb

## 2023-03-20 DIAGNOSIS — Z79899 Other long term (current) drug therapy: Secondary | ICD-10-CM | POA: Diagnosis not present

## 2023-03-20 DIAGNOSIS — Z8673 Personal history of transient ischemic attack (TIA), and cerebral infarction without residual deficits: Secondary | ICD-10-CM | POA: Insufficient documentation

## 2023-03-20 DIAGNOSIS — I480 Paroxysmal atrial fibrillation: Secondary | ICD-10-CM | POA: Diagnosis not present

## 2023-03-20 DIAGNOSIS — R911 Solitary pulmonary nodule: Secondary | ICD-10-CM

## 2023-03-20 DIAGNOSIS — Z86711 Personal history of pulmonary embolism: Secondary | ICD-10-CM | POA: Insufficient documentation

## 2023-03-20 DIAGNOSIS — Z7901 Long term (current) use of anticoagulants: Secondary | ICD-10-CM | POA: Diagnosis not present

## 2023-03-20 DIAGNOSIS — J449 Chronic obstructive pulmonary disease, unspecified: Secondary | ICD-10-CM | POA: Diagnosis not present

## 2023-03-20 DIAGNOSIS — I129 Hypertensive chronic kidney disease with stage 1 through stage 4 chronic kidney disease, or unspecified chronic kidney disease: Secondary | ICD-10-CM | POA: Diagnosis not present

## 2023-03-20 DIAGNOSIS — E782 Mixed hyperlipidemia: Secondary | ICD-10-CM | POA: Diagnosis not present

## 2023-03-20 DIAGNOSIS — Z87442 Personal history of urinary calculi: Secondary | ICD-10-CM | POA: Diagnosis not present

## 2023-03-20 DIAGNOSIS — K219 Gastro-esophageal reflux disease without esophagitis: Secondary | ICD-10-CM | POA: Insufficient documentation

## 2023-03-20 DIAGNOSIS — G4730 Sleep apnea, unspecified: Secondary | ICD-10-CM | POA: Insufficient documentation

## 2023-03-20 DIAGNOSIS — M1990 Unspecified osteoarthritis, unspecified site: Secondary | ICD-10-CM | POA: Insufficient documentation

## 2023-03-20 DIAGNOSIS — Z7984 Long term (current) use of oral hypoglycemic drugs: Secondary | ICD-10-CM | POA: Diagnosis not present

## 2023-03-20 DIAGNOSIS — Z87891 Personal history of nicotine dependence: Secondary | ICD-10-CM | POA: Insufficient documentation

## 2023-03-20 DIAGNOSIS — Z794 Long term (current) use of insulin: Secondary | ICD-10-CM | POA: Diagnosis not present

## 2023-03-20 DIAGNOSIS — C3411 Malignant neoplasm of upper lobe, right bronchus or lung: Secondary | ICD-10-CM | POA: Diagnosis not present

## 2023-03-20 DIAGNOSIS — Z950 Presence of cardiac pacemaker: Secondary | ICD-10-CM | POA: Insufficient documentation

## 2023-03-20 DIAGNOSIS — I2510 Atherosclerotic heart disease of native coronary artery without angina pectoris: Secondary | ICD-10-CM | POA: Insufficient documentation

## 2023-03-20 NOTE — Progress Notes (Signed)
TO BE COMPLETED BY RADIATION ONCOLOGIST OFFICE:   Patient Name: CINZIA DEVOS   Date of Birth: 03/06/1940   Radiation Oncologist:  Dr. Antony Blackbird   Site to be Treated: Right Upper Lung  Will x-rays >10 MV be used? No  Will the radiation be >10 cm from the device? Yes  Planned Treatment Start Date: 03/26/2023  TO BE COMPLETED BY CARDIOLOGIST OFFICE:   Device Information:  Pacemaker [x]      ICD []    Brand: St. Jude/Abbott: [id number] Model #: Allure  Serial Number: [id number]      Date of Placement: 06/06/2017  Site of Placement: Left Chest  Remote Device Check--Frequency: Every 91 days  Last Check: 12/28/2022  Is the Patient Pacer Dependent?:  Yes []   No [x]   Does cardiologist request Radiation Oncology to schedule device testing by vendor for the following:  Prior to the Initiation of Treatments?  Yes []  No [x]  During Treatments?  Yes []  No [x]  Post Radiation Treatments?  Yes [x]  No []   Is device monitoring necessary by vendor/cardiologist team during treatments?  Yes []   No [x]   Is cardiac monitoring by Radiation Oncology nursing necessary during treatments? Yes [x]   No []   Do you recommend device be relocated prior to Radiation Treatment? Yes []   No [x]   **PLEASE LIST ANY NOTES OR SPECIAL REQUESTS:       CARDIOLOGIST SIGNATURE:  Dr. Lewayne Bunting Per Device Clinic Standing Orders, Lenor Coffin  03/20/2023 6:33 PM  **Please route completed form back to Radiation Oncology Nursing and "P [facility] RAD ONC ADMIN", OR send an update if there will be a delay in having form completed by expected start date.  **Call [phone] if you have any questions or do not get an in-basket response from a Radiation Oncology staff member

## 2023-03-26 ENCOUNTER — Ambulatory Visit
Admission: RE | Admit: 2023-03-26 | Discharge: 2023-03-26 | Disposition: A | Discharge status: Home / Self Care | Payer: PPO | Source: Ambulatory Visit | Attending: Radiation Oncology | Admitting: Radiation Oncology

## 2023-03-26 DIAGNOSIS — C3411 Malignant neoplasm of upper lobe, right bronchus or lung: Secondary | ICD-10-CM | POA: Diagnosis not present

## 2023-03-26 DIAGNOSIS — Z87891 Personal history of nicotine dependence: Secondary | ICD-10-CM | POA: Diagnosis not present

## 2023-03-29 ENCOUNTER — Ambulatory Visit (INDEPENDENT_AMBULATORY_CARE_PROVIDER_SITE_OTHER): Payer: Medicare HMO

## 2023-03-29 DIAGNOSIS — I428 Other cardiomyopathies: Secondary | ICD-10-CM

## 2023-03-30 LAB — CUP PACEART REMOTE DEVICE CHECK
Battery Remaining Longevity: 32 mo
Battery Remaining Percentage: 33 %
Battery Voltage: 2.95 V
Date Time Interrogation Session: 20250207040707
Implantable Lead Connection Status: 753985
Implantable Lead Connection Status: 753985
Implantable Lead Connection Status: 753985
Implantable Lead Implant Date: 20110127
Implantable Lead Implant Date: 20110127
Implantable Lead Implant Date: 20110127
Implantable Lead Location: 753858
Implantable Lead Location: 753859
Implantable Lead Location: 753860
Implantable Lead Model: 7122
Implantable Pulse Generator Implant Date: 20190418
Lead Channel Impedance Value: 590 Ohm
Lead Channel Impedance Value: 860 Ohm
Lead Channel Pacing Threshold Amplitude: 0.75 V
Lead Channel Pacing Threshold Amplitude: 1.125 V
Lead Channel Pacing Threshold Pulse Width: 0.5 ms
Lead Channel Pacing Threshold Pulse Width: 0.6 ms
Lead Channel Sensing Intrinsic Amplitude: 12 mV
Lead Channel Setting Pacing Amplitude: 2.125
Lead Channel Setting Pacing Amplitude: 2.5 V
Lead Channel Setting Pacing Pulse Width: 0.5 ms
Lead Channel Setting Pacing Pulse Width: 0.6 ms
Lead Channel Setting Sensing Sensitivity: 2 mV
Pulse Gen Model: 3222
Pulse Gen Serial Number: 9393517

## 2023-04-02 ENCOUNTER — Inpatient Hospital Stay: Payer: PPO | Attending: Internal Medicine

## 2023-04-02 DIAGNOSIS — C3411 Malignant neoplasm of upper lobe, right bronchus or lung: Secondary | ICD-10-CM | POA: Diagnosis not present

## 2023-04-02 DIAGNOSIS — Z87891 Personal history of nicotine dependence: Secondary | ICD-10-CM | POA: Diagnosis not present

## 2023-04-03 ENCOUNTER — Telehealth: Payer: Self-pay | Admitting: Hematology and Oncology

## 2023-04-03 ENCOUNTER — Telehealth: Payer: Self-pay

## 2023-04-03 NOTE — Telephone Encounter (Signed)
Patient stated that she has family members that are sick.Marland Kitchen and wanted to be taken off the schedule for the appointment with Dr Al Pimple on 2/13.  She is supposed to start radiation on 2/17.. I sent message to Dr Al Pimple nurse to see what needs to be done.Marland Kitchen and also to Leonette Most to reach out and have her rescheduled.

## 2023-04-03 NOTE — Telephone Encounter (Signed)
Left patient a vm regarding upcoming appointment

## 2023-04-04 ENCOUNTER — Inpatient Hospital Stay: Payer: PPO | Admitting: Hematology and Oncology

## 2023-04-07 ENCOUNTER — Encounter: Payer: Self-pay | Admitting: Internal Medicine

## 2023-04-08 ENCOUNTER — Ambulatory Visit: Payer: PPO | Admitting: Radiation Oncology

## 2023-04-09 ENCOUNTER — Ambulatory Visit: Payer: PPO | Admitting: Radiation Oncology

## 2023-04-10 ENCOUNTER — Ambulatory Visit: Payer: PPO | Admitting: Radiation Oncology

## 2023-04-11 ENCOUNTER — Telehealth: Payer: Self-pay | Admitting: Radiation Oncology

## 2023-04-11 NOTE — Telephone Encounter (Signed)
 2/20 Patient's daughter left voicemail to cancel patient treatment appt for 2/21 due to being very sick with flu like symptoms.  Email forwarded to Support RTT and copied L1 machine, Miki Kins S/Tarra S., so they are aware.

## 2023-04-12 ENCOUNTER — Ambulatory Visit: Payer: PPO | Admitting: Radiation Oncology

## 2023-04-15 ENCOUNTER — Telehealth: Payer: Self-pay | Admitting: Hematology and Oncology

## 2023-04-15 ENCOUNTER — Telehealth: Payer: Self-pay

## 2023-04-15 NOTE — Telephone Encounter (Signed)
 Spoke with patient confirming upcoming appointment

## 2023-04-15 NOTE — Telephone Encounter (Signed)
 Spoke with patient she is just getting over the flu and still not feeling well.. Advised we don't want her to come in and not be able to be seen.. Sent message to Dr Al Pimple as well as Leonette Most and also to [place] in [facility] to let them know patient will need to be rescheduled.  Patient daughter confirmed and understood.

## 2023-04-16 ENCOUNTER — Inpatient Hospital Stay: Payer: PPO | Admitting: Hematology and Oncology

## 2023-04-16 ENCOUNTER — Ambulatory Visit: Payer: PPO

## 2023-04-16 ENCOUNTER — Ambulatory Visit: Payer: PPO | Admitting: Radiation Oncology

## 2023-04-18 ENCOUNTER — Ambulatory Visit: Admission: RE | Admit: 2023-04-18 | Payer: PPO | Source: Ambulatory Visit | Admitting: Radiation Oncology

## 2023-04-18 ENCOUNTER — Ambulatory Visit: Payer: PPO | Admitting: Radiation Oncology

## 2023-04-21 ENCOUNTER — Emergency Department (HOSPITAL_COMMUNITY)

## 2023-04-21 ENCOUNTER — Other Ambulatory Visit: Payer: Self-pay

## 2023-04-21 ENCOUNTER — Emergency Department (HOSPITAL_COMMUNITY)
Admission: EM | Admit: 2023-04-21 | Discharge: 2023-04-22 | Disposition: A | Discharge status: Home / Self Care | Attending: Emergency Medicine | Admitting: Emergency Medicine

## 2023-04-21 DIAGNOSIS — Z7901 Long term (current) use of anticoagulants: Secondary | ICD-10-CM | POA: Diagnosis not present

## 2023-04-21 DIAGNOSIS — R42 Dizziness and giddiness: Secondary | ICD-10-CM | POA: Diagnosis not present

## 2023-04-21 DIAGNOSIS — R918 Other nonspecific abnormal finding of lung field: Secondary | ICD-10-CM | POA: Diagnosis not present

## 2023-04-21 DIAGNOSIS — I700 Atherosclerosis of aorta: Secondary | ICD-10-CM | POA: Diagnosis not present

## 2023-04-21 DIAGNOSIS — I2510 Atherosclerotic heart disease of native coronary artery without angina pectoris: Secondary | ICD-10-CM | POA: Diagnosis not present

## 2023-04-21 DIAGNOSIS — R911 Solitary pulmonary nodule: Secondary | ICD-10-CM | POA: Diagnosis not present

## 2023-04-21 DIAGNOSIS — Z86711 Personal history of pulmonary embolism: Secondary | ICD-10-CM | POA: Insufficient documentation

## 2023-04-21 DIAGNOSIS — Z85118 Personal history of other malignant neoplasm of bronchus and lung: Secondary | ICD-10-CM | POA: Insufficient documentation

## 2023-04-21 DIAGNOSIS — R410 Disorientation, unspecified: Secondary | ICD-10-CM | POA: Diagnosis not present

## 2023-04-21 DIAGNOSIS — I708 Atherosclerosis of other arteries: Secondary | ICD-10-CM | POA: Diagnosis not present

## 2023-04-21 DIAGNOSIS — I672 Cerebral atherosclerosis: Secondary | ICD-10-CM | POA: Insufficient documentation

## 2023-04-21 DIAGNOSIS — Z950 Presence of cardiac pacemaker: Secondary | ICD-10-CM | POA: Insufficient documentation

## 2023-04-21 DIAGNOSIS — I6622 Occlusion and stenosis of left posterior cerebral artery: Secondary | ICD-10-CM | POA: Diagnosis not present

## 2023-04-21 DIAGNOSIS — Z471 Aftercare following joint replacement surgery: Secondary | ICD-10-CM | POA: Diagnosis not present

## 2023-04-21 DIAGNOSIS — I951 Orthostatic hypotension: Secondary | ICD-10-CM | POA: Diagnosis not present

## 2023-04-21 DIAGNOSIS — Z7984 Long term (current) use of oral hypoglycemic drugs: Secondary | ICD-10-CM | POA: Diagnosis not present

## 2023-04-21 DIAGNOSIS — E119 Type 2 diabetes mellitus without complications: Secondary | ICD-10-CM | POA: Insufficient documentation

## 2023-04-21 DIAGNOSIS — R519 Headache, unspecified: Secondary | ICD-10-CM | POA: Diagnosis not present

## 2023-04-21 DIAGNOSIS — I4510 Unspecified right bundle-branch block: Secondary | ICD-10-CM | POA: Diagnosis not present

## 2023-04-21 DIAGNOSIS — Z8673 Personal history of transient ischemic attack (TIA), and cerebral infarction without residual deficits: Secondary | ICD-10-CM | POA: Diagnosis not present

## 2023-04-21 DIAGNOSIS — I6782 Cerebral ischemia: Secondary | ICD-10-CM | POA: Insufficient documentation

## 2023-04-21 DIAGNOSIS — I6521 Occlusion and stenosis of right carotid artery: Secondary | ICD-10-CM | POA: Diagnosis not present

## 2023-04-21 DIAGNOSIS — I447 Left bundle-branch block, unspecified: Secondary | ICD-10-CM | POA: Diagnosis not present

## 2023-04-21 DIAGNOSIS — R4182 Altered mental status, unspecified: Secondary | ICD-10-CM | POA: Diagnosis not present

## 2023-04-21 DIAGNOSIS — R0902 Hypoxemia: Secondary | ICD-10-CM | POA: Diagnosis not present

## 2023-04-21 DIAGNOSIS — R000 Tachycardia, unspecified: Secondary | ICD-10-CM | POA: Diagnosis not present

## 2023-04-21 DIAGNOSIS — Z794 Long term (current) use of insulin: Secondary | ICD-10-CM | POA: Insufficient documentation

## 2023-04-21 LAB — RESP PANEL BY RT-PCR (RSV, FLU A&B, COVID)  RVPGX2
Influenza A by PCR: NEGATIVE
Influenza B by PCR: NEGATIVE
Resp Syncytial Virus by PCR: NEGATIVE
SARS Coronavirus 2 by RT PCR: NEGATIVE

## 2023-04-21 LAB — COMPREHENSIVE METABOLIC PANEL
ALT: 15 U/L (ref 0–44)
AST: 21 U/L (ref 15–41)
Albumin: 3.8 g/dL (ref 3.5–5.0)
Alkaline Phosphatase: 68 U/L (ref 38–126)
Anion gap: 11 (ref 5–15)
BUN: 22 mg/dL (ref 8–23)
CO2: 25 mmol/L (ref 22–32)
Calcium: 9.5 mg/dL (ref 8.9–10.3)
Chloride: 104 mmol/L (ref 98–111)
Creatinine, Ser: 1.08 mg/dL — ABNORMAL HIGH (ref 0.44–1.00)
GFR, Estimated: 51 mL/min — ABNORMAL LOW (ref >60)
Glucose, Bld: 120 mg/dL — ABNORMAL HIGH (ref 70–99)
Potassium: 3.5 mmol/L (ref 3.5–5.1)
Sodium: 140 mmol/L (ref 135–145)
Total Bilirubin: 0.8 mg/dL (ref 0.0–1.2)
Total Protein: 7.4 g/dL (ref 6.5–8.1)

## 2023-04-21 LAB — CBC WITH DIFFERENTIAL/PLATELET
Abs Immature Granulocytes: 0.03 10*3/uL (ref 0.00–0.07)
Basophils Absolute: 0.1 10*3/uL (ref 0.0–0.1)
Basophils Relative: 1 %
Eosinophils Absolute: 0.2 10*3/uL (ref 0.0–0.5)
Eosinophils Relative: 2 %
HCT: 39.4 % (ref 36.0–46.0)
Hemoglobin: 12 g/dL (ref 12.0–15.0)
Immature Granulocytes: 0 %
Lymphocytes Relative: 18 %
Lymphs Abs: 1.9 10*3/uL (ref 0.7–4.0)
MCH: 23.4 pg — ABNORMAL LOW (ref 26.0–34.0)
MCHC: 30.5 g/dL (ref 30.0–36.0)
MCV: 76.8 fL — ABNORMAL LOW (ref 80.0–100.0)
Monocytes Absolute: 0.6 10*3/uL (ref 0.1–1.0)
Monocytes Relative: 6 %
Neutro Abs: 7.4 10*3/uL (ref 1.7–7.7)
Neutrophils Relative %: 73 %
Platelets: 287 10*3/uL (ref 150–400)
RBC: 5.13 MIL/uL — ABNORMAL HIGH (ref 3.87–5.11)
RDW: 17.8 % — ABNORMAL HIGH (ref 11.5–15.5)
WBC: 10.3 10*3/uL (ref 4.0–10.5)
nRBC: 0 % (ref 0.0–0.2)

## 2023-04-21 LAB — URINALYSIS, ROUTINE W REFLEX MICROSCOPIC
Bilirubin Urine: NEGATIVE
Glucose, UA: >=500 mg/dL — AB
Hgb urine dipstick: NEGATIVE
Ketones, ur: NEGATIVE mg/dL
Leukocytes,Ua: NEGATIVE
Nitrite: NEGATIVE
Protein, ur: NEGATIVE mg/dL
Specific Gravity, Urine: 1.003 — ABNORMAL LOW (ref 1.005–1.030)
pH: 7 (ref 5.0–8.0)

## 2023-04-21 LAB — TROPONIN I (HIGH SENSITIVITY)
Troponin I (High Sensitivity): 15 ng/L (ref <18)
Troponin I (High Sensitivity): 18 ng/L — ABNORMAL HIGH (ref <18)

## 2023-04-21 MED ORDER — SODIUM CHLORIDE 0.9 % IV BOLUS
1000.0000 mL | Freq: Once | INTRAVENOUS | Status: AC
  Administered 2023-04-21: 1000 mL via INTRAVENOUS

## 2023-04-21 NOTE — ED Provider Notes (Signed)
 Emily Abbott EMERGENCY DEPARTMENT AT [facility] Provider Note   CSN: [id number] Arrival date & time: 04/21/23  1909     History  Chief Complaint  Patient presents with   Dizziness    Pt c/o dizziness and confusion upon standing. EMS states after a few minutes, she returns coherent. Pt has pt has phillips pacemaker. Pt has hx of stroke (last march).  EMS BP: 220/100 HR 94     Emily Abbott is a 83 y.o. female, history of A-fib, CAD, PE, lung cancer, diabetes, pacemaker, who presents to the ED secondary to multiple episodes, of lightheadedness, confusion, and severe headache, when standing at times.  She states that she has been sick for the last couple weeks, and her symptoms have gotten worse.  She states they are more frequent in nature, occurring daily now, and it occurs, when she stands.  She feels like she is confused, and not right and feels little bit lightheaded like she is going to pass out, during this time she also gets a severe headache, that is stabbing, which goes away after sitting down.  She does not pass out, but feels like she is going to pass out during these episodes.  Denies any chest pain, or shortness of breath.  Reports she has a pacemaker, and does not remember the last time she had had it interrogated.  She has had an upper respiratory infection, but got checked by her primary care doctor yesterday, and states she does not have pneumonia.  She has been compliant with all of her medications.  Denies any changes in speech, weakness on one side of the body, vision changes.     Home Medications Prior to Admission medications   Medication Sig Start Date End Date Taking? Authorizing Provider  albuterol (PROAIR HFA) 108 (90 Base) MCG/ACT inhaler Inhale 1-2 puffs into the lungs every 6 (six) hours as needed for wheezing or shortness of breath. 11/15/22   Olalere, Onnie Boer A, MD  albuterol (VENTOLIN HFA) 108 (90 Base) MCG/ACT inhaler INHALE 1-2 PUFFS BY MOUTH EVERY 6  HOURS AS NEEDED FOR WHEEZE OR SHORTNESS OF BREATH Patient not taking: Reported on 03/20/2023 11/15/22   Tomma Lightning, MD  ALPRAZolam Prudy Feeler) 0.5 MG tablet Take 0.5 mg by mouth 2 (two) times daily as needed for anxiety or sleep.     [provider]  calcium carbonate (TUMS EX) 750 MG chewable tablet Chew 2 tablets by mouth as needed for heartburn.    [provider]  cholecalciferol (VITAMIN D3) 25 MCG (1000 UNIT) tablet Take 1,000 Units by mouth daily.    [provider]  diphenhydrAMINE (BENADRYL) 25 MG tablet Take 12.5 mg by mouth daily as needed for itching.    [provider]  ELIQUIS 2.5 MG TABS tablet Take 2.5 mg by mouth 2 (two) times daily.    [provider]  Empagliflozin-linaGLIPtin (GLYXAMBI) 25-5 MG TABS Take 1 tablet by mouth daily.    [provider]  Evolocumab (REPATHA) 140 MG/ML SOSY Inject into the skin every 14 (fourteen) days. Patient not taking: Reported on 03/20/2023    [provider]  ezetimibe (ZETIA) 10 MG tablet Take 10 mg by mouth at bedtime.    [provider]  fluticasone (FLONASE) 50 MCG/ACT nasal spray Place into both nostrils.    [provider]  furosemide (LASIX) 40 MG tablet TAKE 1 TABLET BY MOUTH ONCE DAILY 03/21/23   Marinus Maw, MD  HYDROcodone-acetaminophen (NORCO/VICODIN) 5-325 MG  tablet Take 0.5-1 tablets by mouth at bedtime.    [provider]  Insulin Glargine (BASAGLAR KWIKPEN) 100 UNIT/ML Inject 18 Units into the skin daily. 07/15/19   [provider]  ipratropium (ATROVENT) 0.06 % nasal spray Place into both nostrils.    [provider]  ipratropium-albuterol (DUONEB) 0.5-2.5 (3) MG/3ML SOLN Take 3 mLs by nebulization every 6 (six) hours as needed. 02/16/18   Leatha Gilding, MD  magnesium gluconate (MAGONATE) 500 MG tablet Take 500 mg by mouth daily as needed (for leg cramps).  Patient not taking: Reported on 03/20/2023    [provider]  metoprolol succinate (TOPROL XL) 25 MG 24 hr tablet Take 1 tablet (25 mg total) by mouth daily. 08/07/22   Sheilah Pigeon, PA-C  nystatin cream (MYCOSTATIN) 1 APPLICATION EXTERNALLY TWICE A DAY AS NEEDED FOR YEAST 05/09/22   [provider]  omeprazole (PRILOSEC) 40 MG capsule Take 40 mg by mouth daily.      [provider]  OXYGEN Inhale 1.5 L into the lungs at bedtime.     [provider]  PARoxetine (PAXIL) 30 MG tablet Take 30 mg by mouth at bedtime.    [provider]  tiZANidine (ZANAFLEX) 2 MG tablet Take 2 mg by mouth at bedtime as needed for muscle spasms.     [provider]  valACYclovir (VALTREX) 1000 MG tablet TAKE 1 TABLET BY MOUTH EVERY 12 HOURS AS NEEDED FOR COLD SORES Patient not taking: Reported on 03/20/2023 07/05/22   [provider]      Allergies    Metformin and related, Adhesive [tape], Atorvastatin, and Sulfonamide derivatives    Review of Systems   Review of Systems  Neurological:  Positive for light-headedness. Negative for numbness.    Physical Exam Updated Vital Signs BP (!) 177/107 (BP Location: Right Arm)   Pulse 73   Temp (!) 97.5 F (36.4 C) (Oral)   Resp 18   Ht 5\' 6"  (1.676 m)   Wt 82.1 kg   SpO2 100%   BMI 29.21 kg/m  Physical Exam Vitals and nursing note reviewed.  Constitutional:      General: She is not in acute distress.    Appearance: She is well-developed.  HENT:     Head: Normocephalic and atraumatic.  Eyes:     Conjunctiva/sclera: Conjunctivae normal.  Cardiovascular:     Rate and Rhythm: Normal rate and regular rhythm.     Heart sounds: No murmur heard. Pulmonary:     Effort: Pulmonary effort is normal. No respiratory distress.     Breath sounds: Normal breath sounds.     Comments: Dry cough on exam Abdominal:     Palpations: Abdomen is soft.     Tenderness: There is no abdominal tenderness.  Musculoskeletal:        General: No swelling.     Cervical  back: Neck supple.  Skin:    General: Skin is warm and dry.     Capillary Refill: Capillary refill takes less than 2 seconds.  Neurological:     General: No focal deficit present.     Mental Status: She is alert and oriented to person, place, and time. Mental status is at baseline.  Psychiatric:        Mood and Affect: Mood normal.     ED Results / Procedures / Treatments   Labs (all labs ordered are listed, but only abnormal results are displayed) Labs Reviewed  CBC WITH DIFFERENTIAL/PLATELET -  Abnormal; Notable for the following components:      Result Value   RBC 5.13 (*)    MCV 76.8 (*)    MCH 23.4 (*)    RDW 17.8 (*)    All other components within normal limits  COMPREHENSIVE METABOLIC PANEL - Abnormal; Notable for the following components:   Glucose, Bld 120 (*)    Creatinine, Ser 1.08 (*)    GFR, Estimated 51 (*)    All other components within normal limits  URINALYSIS, ROUTINE W REFLEX MICROSCOPIC - Abnormal; Notable for the following components:   Color, Urine STRAW (*)    Specific Gravity, Urine 1.003 (*)    Glucose, UA >=500 (*)    Bacteria, UA RARE (*)    All other components within normal limits  RESP PANEL BY RT-PCR (RSV, FLU A&B, COVID)  RVPGX2  TROPONIN I (HIGH SENSITIVITY)  TROPONIN I (HIGH SENSITIVITY)    EKG EKG Interpretation Date/Time:  Sunday April 21 2023 19:40:17 EST Ventricular Rate:  70 PR Interval:    QRS Duration:  169 QT Interval:  533 QTC Calculation: 576 R Axis:   107  Text Interpretation: Electronic ventricular pacemaker RBBB and LPFB Probable lateral infarct, old No significant change since last tracing Confirmed by Gwyneth Sprout ([id number]) on 04/21/2023 8:09:58 PM  Radiology No results found.  Procedures Procedures    Medications Ordered in ED Medications  sodium chloride 0.9 % bolus 1,000 mL (1,000 mLs Intravenous New Bag/Given 04/21/23 2019)    ED Course/ Medical Decision Making/ A&P                                  Medical Decision Making Patient is an 83 year old female, here for weakness, confusion, presyncope, and headache, when standing, after being out for 3 to 5 minutes.  This been going on for the last few weeks, but is gotten worse since she has been ill.  She is overall well-appearing, last episode occurred today.  Will obtain orthostatics, head CT, interrogate pacemaker, chest x-ray, given recent illness, and blood work.  EKG ordered.  Pacemaker interrogated unremarkable  Amount and/or Complexity of Data Reviewed Labs: ordered.    Details: Unremarkable labs Radiology: ordered. Discussion of management or test interpretation with external provider(s): Pending CT head, and chest x-ray, blood work is unremarkable.  Orthostatics, or severe, with systolics, dropping from 183, while sitting, to 122 when standing.  Providing fluids now, pending reevaluation, after CT head, chest x-ray, and ambulation trial, after fluids.  Handed off to [place], [place], for follow-up and disposition    Final Clinical Impression(s) / ED Diagnoses Final diagnoses:  None    Rx / DC Orders ED Discharge Orders     None         Khamarion Bjelland, [place], PA 04/21/23 2111    Gwyneth Sprout, MD 04/23/23 2130

## 2023-04-21 NOTE — ED Provider Notes (Signed)
   Accepted handoff at shift change from [facility]. Please see prior provider note for more detail.   Briefly: Patient is 83 y.o. " presents to the ED secondary to multiple episodes, of lightheadedness, confusion, and severe headache, when standing at times.  She states that she has been sick for the last couple weeks, and her symptoms have gotten worse.  She states they are more frequent in nature, occurring daily now, and it occurs, when she stands.  She feels like she is confused, and not right and feels little bit lightheaded like she is going to pass out, during this time she also gets a severe headache, that is stabbing, which goes away after sitting down.  She does not pass out, but feels like she is going to pass out during these episodes.  Denies any chest pain, or shortness of breath.  Reports she has a pacemaker, and does not remember the last time she had had it interrogated.  She has had an upper respiratory infection, but got checked by her primary care doctor yesterday, and states she does not have pneumonia.  She has been compliant with all of her medications.  Denies any changes in speech, weakness on one side of the body, vision changes."   Plan:  - dispo pending imaging and ambulation - patient ambulated and stating that her symptoms have improved. CT head without acute abnormality. - upon further chart review with Dr. Eloise Harman, it appears that patient has a hx of left subclavian stenosis (60%). We will further assess for possible subclavian-vertebral artery steal syndrome with CTA head and neck. If this is negative and patient still feels okay, she will need to follow up with her outpatient cardiologist. If CTA is showing furthering stenosis, she will need vascular consult and possible admission. If patient's CTA is not showing acute process and she becomes concerningly symptomatic again, she may need medical admission for further workup.  12PM Care of @PATIENTNAME @ transferred to [facility]  [place]  at the end of my shift as the patient will require reassessment once labs/imaging have resulted. Patient presentation, ED course, and plan of care discussed with review of all pertinent labs and imaging. Please see his/her note for further details regarding further ED course and disposition. Please see above for plan. This may be altered or completely changed at the discretion of the oncoming team pending results of further workup.      Dorthy Cooler, [place] 04/21/23 2348    Gwyneth Sprout, MD 04/23/23 2129

## 2023-04-21 NOTE — ED Notes (Signed)
 Orthostatic VS  Lying: BP: 144/103 HR 73  Sitting: BP:183/102 HR 74  Standing: BP: 122/78 82

## 2023-04-21 NOTE — ED Notes (Signed)
 Ambulated pt again and pt did not desat. Pt 02 sat did not drop below 95%.

## 2023-04-22 DIAGNOSIS — I6521 Occlusion and stenosis of right carotid artery: Secondary | ICD-10-CM | POA: Diagnosis not present

## 2023-04-22 DIAGNOSIS — I6622 Occlusion and stenosis of left posterior cerebral artery: Secondary | ICD-10-CM | POA: Diagnosis not present

## 2023-04-22 DIAGNOSIS — I708 Atherosclerosis of other arteries: Secondary | ICD-10-CM | POA: Diagnosis not present

## 2023-04-22 MED ORDER — IOHEXOL 350 MG/ML SOLN
75.0000 mL | Freq: Once | INTRAVENOUS | Status: AC | PRN
  Administered 2023-04-22: 75 mL via INTRAVENOUS

## 2023-04-22 NOTE — Discharge Instructions (Addendum)
 As we discussed, your blood pressure drops when you change positions.  This is called orthostatic hypotension.  Please wear compression socks at home to help promote blood flow back to your heart.  You seem to be hydrating well, please continue to do this.  You can also add in salt into your diet to help prevent your blood pressure from dropping.  When you change positions, go from laying to sitting to standing very slowly.  Please allow for 30 seconds to 1 minute between changes in position to allow your body time to compensate.  Please make your cardiologist aware of this finding at your next visit as they will be able to further manage this.  You are also on a couple medications that can cause some dizziness, especially when taken together.  These medications include your Benadryl, tizanidine, Xanax.  Please use these medications sparingly and limit using them at the same time.  Your urine did not show any signs of infection.  You did not have any electrolyte abnormalities today.  Your flu, COVID, and RSV test were negative.  As discussed, you had increased narrowing of one of the vessels in your brain, the PCA.  I discussed this with the neurologist, they do not feel like this is contributing to any of your symptoms.  They recommended keeping you on your Eliquis.  They did not feel that you need any further evaluation regarding this finding.  You also have some pulmonary nodules in the right lung.  It appears there may be a new and/or increased nodule from prior scan.  Please make your PCP and oncologist aware of the CT scan finding.  They will need to continue monitoring these nodules.  I have included the scan result below.  Please return to the ER for any chest pain, dizziness, loss of consciousness, unilateral weakness, any other new or concerning symptoms.   "EXAM: CT ANGIOGRAPHY HEAD AND NECK WITH AND WITHOUT CONTRAST   TECHNIQUE: Multidetector CT imaging of the head and neck was performed  using the standard protocol during bolus administration of intravenous contrast. Multiplanar CT image reconstructions and MIPs were obtained to evaluate the vascular anatomy. Carotid stenosis measurements (when applicable) are obtained utilizing NASCET criteria, using the distal internal carotid diameter as the denominator.   RADIATION DOSE REDUCTION: This exam was performed according to the departmental dose-optimization program which includes automated exposure control, adjustment of the mA and/or kV according to patient size and/or use of iterative reconstruction technique.   CONTRAST:  75mL OMNIPAQUE IOHEXOL 350 MG/ML SOLN   COMPARISON:  CT from 04/21/2023 and exam from 05/11/2022   FINDINGS: CTA NECK FINDINGS   Aortic arch: Visualized aortic arch within normal limits for caliber with standard 3 vessel morphology. Aortic atherosclerosis. Associated 60% stenosis about the proximal left subclavian artery (series 5, image 310).   Right carotid system: Right common and internal carotid arteries are patent without dissection. Calcified plaque about the right carotid bulb without hemodynamically significant greater than 50% stenosis.   Left carotid system: Left common and internal carotid arteries are patent without dissection. Calcified plaque about the left oval by hemodynamically significant greater than 50% stenosis.   Vertebral arteries: Both vertebral arteries arise from subclavian arteries. Vertebral arteries are patent without significant stenosis or dissection.   Skeleton: No discrete or worrisome osseous lesions. Moderate spondylosis present at C5-6 and C6-7. Patient is edentulous. Osteoarthritic changes noted about the left TMJ.   Other neck: No other acute finding.   Upper chest:  1.6 x 1.6 cm right upper lobe pulmonary nodule. This was concerning for a primary bronchogenic carcinoma on prior PET-CT from 02/15/2023. Additional 1.4 x 0.8 cm nodule positioned  just peripherally noted as well (series 7, image 168). There is a new/increased 7 mm nodule located just superiorly (series 7, image 156). 12 mm ground-glass density/nodule within the left upper lobe, stable. Few additional scattered subcentimeter nodules noted within the visualized lungs bilaterally, indeterminate, but grossly similar.   Review of the MIP images confirms the above findings   CTA HEAD FINDINGS   Anterior circulation: Atheromatous change about the carotid siphons with no more than mild multifocal narrowing. A1 segments patent bilaterally. Left A1 is hypoplastic. Normal anterior communicating artery complex. Anterior cerebral arteries patent without significant stenosis.   Left M1 segment patent. Focal moderate stenosis at the distal right M1 segment/right MCA bifurcation, similar to prior (series 10, image 21). No proximal right MCA branch occlusion. Distal MCA branches perfused and fairly symmetric.   Posterior circulation: Both V4 segments patent without stenosis. Right vertebral artery dominant. Both PICA patent. Basilar patent without stenosis. Superior cerebral arteries patent bilaterally. Both PCAs primarily supplied via the basilar. Chronic functional occlusion of the left PCA at the proximal left P2 segment, seen on prior, but progressed and worsened in appearance on today's exam with the occlusion now more proximal in nature (series 10, image 22). Scant attenuated flow seen within the left PCA distally. Progressive atheromatous disease about the right PCA with moderate diffuse narrowing of the right P1/P2 segments (series 10, image 22). Right PCA remains patent to its distal aspect.   Venous sinuses: Grossly patent allowing for timing the contrast bolus.   Anatomic variants: As above.  No aneurysm.   Review of the MIP images confirms the above findings   IMPRESSION: 1. 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 Scant attenuated flow seen within the left PCA distally. 2. Progressive atheromatous disease about the right PCA with moderate diffuse narrowing of the right P1/P2 segments. Right PCA remains patent to its distal aspect. 3. Focal moderate stenosis at the distal right M1 segment/right MCA bifurcation, similar to prior. 4. 60% stenosis about the proximal left subclavian artery. 5. 1.6 cm right upper lobe pulmonary nodule,. This was concerning for a primary bronchogenic carcinoma on prior PET-CT from 02/15/2023. Additional 1.4 x 0.8 cm nodule positioned just peripherally. 7 mm nodule just superiorly is new and/or increased from prior. 6. 12 mm ground-glass density/nodule within the left upper lobe, indeterminate, but similar to prior. Continued attention at follow-up recommended. 7. Aortic Atherosclerosis (ICD10-I70.0).     Electronically Signed   By: Rise Mu M.D.   On: 04/22/2023 00:40"

## 2023-04-22 NOTE — ED Provider Notes (Signed)
 H/o viral syndrome a couple weeks ago. Ever since then has been getting dizzy when walking. Pending CTA head and neck to rule out any worsening stenosis.  May need vascular consult if she has worsening stenosis.  Physical Exam  BP (!) 143/73   Pulse 70   Temp 97.9 F (36.6 C) (Oral)   Resp 13   Ht 5\' 6"  (1.676 m)   Wt 82.1 kg   SpO2 98%   BMI 29.21 kg/m   Physical Exam Vitals and nursing note reviewed.  Constitutional:      Appearance: Normal appearance.  HENT:     Head: Atraumatic.  Pulmonary:     Effort: Pulmonary effort is normal.  Neurological:     General: No focal deficit present.     Mental Status: She is alert.  Psychiatric:        Mood and Affect: Mood normal.        Behavior: Behavior normal.     Procedures  Procedures  ED Course / MDM    Medical Decision Making Amount and/or Complexity of Data Reviewed Labs: ordered. Radiology: ordered.  Risk Prescription drug management.     ED Course:  Patient received as a signout.  Please see prior note for referral details.  In short, patient is presenting for dizziness that occurs when she gets up to walk, worsening over the last couple of weeks.  She denies any chest pain, shortness of breath, loss of consciousness.  CTA head and neck did not show any acute worsening of her left subclavian artery stenosis.  Remains stable from prior exam.  There was worsening stenosis noted of the left PCA.  I discussed this with neurology Dr. Derry Lory.  He does not feel that this would correlate with patient's symptoms.  They recommended keeping patient on her Eliquis, did not recommend any further evaluation or follow-up regarding this finding. CTA also noted pulmonary nodules in the right lobe.  Patient does know about these nodules and is following currently with oncology for this, scheduled to start radiation within the next couple weeks.  She understands that she needs to discuss the CT findings today with her oncologist  and PCP as there may be a new pulmonary nodule since the prior scan. Patient is on multiple medications including Xanax, Benadryl, tizanidine, that could be contributing to her dizziness.  I discussed this with the patient and recommended that she use these medications very sparingly and do not take these together. She also was found to have orthostatic hypotension.  Suspect this could be causing her dizziness especially as she describes the dizziness as occurring when she stands up and is walking around.  We discussed measures such as wearing compression stocks, keeping well-hydrated including more salt in her diet, and changing positions slowly.  I recommend that she follow-up with her cardiologist regarding her orthostatic hypotension for further management.   Stable and appropriate for discharge home at this time with close follow up with PCP and oncologist regarding her pulmonary nodules and CT scan findings today.  Copy of her CT scans results were included in her discharge paperwork.  Impression: Orthostatic hypotension Pulmonary nodules  Disposition:  The patient was discharged home with instructions to follow-up with her oncologist regarding her pulmonary nodules.  She was instructed to make them aware of her CT scan results from today.  Follow-up with cardiology regarding her orthostatic hypotension.  We discussed at home measures including compression stockings, increasing salt intake, and changing positions slowly.  Discussed  limiting use of Xanax, Benadryl, and tizanidine. Return precautions given.  Imaging Studies ordered: I ordered imaging studies including CTA head and neck, CT head, chest x-ray I independently visualized the imaging with scope of interpretation limited to determining acute life threatening conditions related to emergency care.  I agree with the radiologist interpretation   Cardiac Monitoring: / EKG: The patient was maintained on a cardiac monitor.  I  personally viewed and interpreted the cardiac monitored which showed an underlying rhythm of: Paced rhythm, no ST changes   Consultations Obtained: I requested consultation with the neurologist Dr. Derry Lory,  and discussed lab and imaging findings as well as pertinent plan - they recommend: They do not feel her PCA narrowing is causing her symptoms. She is already on eliquis and they would not anti-coagulate any further. Recommend reviewing home meds with patient as some of her home meds currently listed like xanax, benadryl, tizanidine could be contributing to dizziness. Also recommend checking orthostats.  If none of this explains her symptoms, they recommended adding TSH and cortisol levels.           Arabella Merles, PA-C 04/22/23 0359    Marily Memos, MD 04/22/23 [phone]

## 2023-04-23 ENCOUNTER — Ambulatory Visit: Payer: PPO

## 2023-04-23 ENCOUNTER — Ambulatory Visit: Payer: PPO | Admitting: Radiation Oncology

## 2023-04-25 ENCOUNTER — Ambulatory Visit: Payer: PPO | Admitting: Radiation Oncology

## 2023-04-25 ENCOUNTER — Other Ambulatory Visit: Payer: Self-pay

## 2023-04-25 ENCOUNTER — Telehealth: Payer: Self-pay

## 2023-04-25 ENCOUNTER — Ambulatory Visit
Admission: RE | Admit: 2023-04-25 | Discharge: 2023-04-25 | Disposition: A | Discharge status: Home / Self Care | Payer: PPO | Source: Ambulatory Visit | Attending: Radiation Oncology | Admitting: Radiation Oncology

## 2023-04-25 ENCOUNTER — Inpatient Hospital Stay: Payer: PPO | Attending: Hematology and Oncology | Admitting: Hematology and Oncology

## 2023-04-25 DIAGNOSIS — C3411 Malignant neoplasm of upper lobe, right bronchus or lung: Secondary | ICD-10-CM | POA: Diagnosis not present

## 2023-04-25 LAB — RAD ONC ARIA SESSION SUMMARY
Course Elapsed Days: 0
Plan Fractions Treated to Date: 1
Plan Prescribed Dose Per Fraction: 18 Gy
Plan Total Fractions Prescribed: 3
Plan Total Prescribed Dose: 54 Gy
Reference Point Dosage Given to Date: 18 Gy
Reference Point Session Dosage Given: 18 Gy
Session Number: 1

## 2023-04-25 NOTE — Telephone Encounter (Signed)
 Call placed to [facility] [employer] device to arrange pacemaker interrogation after patient receives her last SBRT treatment on 05/02/23. Spoke with [facility] whom states that remote interrogation will be performed on 05/02/23.

## 2023-04-26 ENCOUNTER — Ambulatory Visit: Payer: PPO | Admitting: Radiation Oncology

## 2023-04-29 NOTE — Progress Notes (Unsigned)
 Cardiology Office Note Date:  04/29/2023  Patient ID:  Emily Abbott, Emily Abbott [birth year], MRN [id number] PCP:  [name], MD  Electrophysiologist: Dr. Ladona Ridgel   Chief Complaint:  post hospital  History of Present Illness: Emily Abbott is a 83 y.o. female with history of  DM, COPD with QHS and PRN home O2, uses infrequently other then at [facility]   MV repair (28mm Physio ring annuloplasty) done in 2010, with LAA ligation and MAZE inter-op. no known CAD, Notes mention history of normal coronaries by cath in 2010, no further cath procedures that I find,  CM, LBBB, w/ICD, chronic CHF,  HTN, HLD,   Hx of PE, TIA Permanent AFib.    -- She saw Dr. Ladona Ridgel 08/29/21, doing well, minimal palpitations, some edema, mild DOE. Planned to transition from warfarin > Eliquis, no other changes  -- Subsequent calls regarding eliquis and warfarin, appears she never made the transition, unclear why  Admitted 05/10/22 treated for TIA, INR subtherapeutic and warfarin changed to Eliquis Discharged 05/12/22 LVEF during the stay noted 40-45%, down from her prior 60-65% Mild RVEF reduction MVRepair functioning well  April/May 2024 reported nose bleeding with Eliquis, though were unable to reach her via telephone   I saw her 08/07/22 Had a syncopal event Markedly positive orthostatic BP, meds adjusted Volume stable No visits since  Somewhere after this her Eliquis dose reduced for severe nose bleeds to 2.5 BID Sometime after that she had it seems been inadvertently taking 2.5mg  only daily perhaps for a few weeks leading into the ER visit below  Jan 2025 diagnosed with lung cancer, stage IA primary bronchogenic carcinoma, biopsy on 02-25-23 showing: Poorly differentiated squamous cell carcinoma  Planned for XRT  Has completed 2 of 3 planned treatments  ER visit 04/21/23 Dizziness, confusion upon standing EMS 220/100, 94bpm Reported "sick" for a couple weeks, lightheaded, near syncope EKG reported as  paced Orthostatics, with systolics, dropping from 183, while sitting, to 122 when standing. >> IVF CT head without acute abnormality.  appears that patient has a hx of left subclavian stenosis (60%). We will further assess for possible subclavian-vertebral artery steal syndrome with CTA head and neck  CTA head and neck did not show any acute worsening of her left subclavian artery stenosis.  Remains stable from prior exam.  There was worsening stenosis noted of the left PCA.  I discussed this with neurology Dr. Derry Lory.  He does not feel that this would correlate with patient's symptoms.  They recommended keeping patient on her Eliquis, did not recommend any further evaluation or follow-up regarding this finding. Discharged Also noted pulm nodules, she was aware and following these outpt  TODAY  She is accompanied by her daughter that she lives with She had been having episodes of AMS, associated with E sidedweakness, numbness No falls or full faints reported No CP Infrequently aware of a skip in her beat No SOB   Device information: SJM CRT-D, implanted 03/17/09 > gen change downgrade to CRT-P 06/06/2017  AFib/AAD Hx Tikosyn >> stopped with progression to persistent AFib 2019 Presumed permanent, device programmed VVIR  Past Medical History:  Diagnosis Date   Anemia    Anxiety    CAD (coronary artery disease)    Chronic kidney disease    CKD stage 3    COPD (chronic obstructive pulmonary disease) (HCC)    uses  1 to and1.5 L of o2 at bedtime ; has not seen her pulm doctor in over a year ;  has not had to uses inhlaer in a over a year     Depression    Diabetes mellitus    type II   Dyspnea    with exertion; has oxygen at home if needed   GERD (gastroesophageal reflux disease)    History of kidney stones    present in kidney   HTN (hypertension)    essential   Hyperlipidemia, mixed    Mitral valve disorder    Osteoarthritis    Panic attack    Paroxysmal atrial  fibrillation (HCC)    Pneumonia    2010   PONV (postoperative nausea and vomiting)    Presence of permanent cardiac pacemaker    [employer]   Pulmonary embolism (HCC) 2010   Sleep apnea    does not uses cpap since she got a URI from it. has home 02 1.5L qhs   Stroke (HCC) 04/2022    Past Surgical History:  Procedure Laterality Date   BIOPSY BREAST Right    BIV ICD GENERATOR CHANGEOUT N/A 06/06/2017   Procedure: BIV ICD GENERATOR CHANGEOUT;  Surgeon: Marinus Maw, MD;  Location: [facility] INVASIVE CV LAB;  Service: Cardiovascular;  Laterality: N/A;   BRONCHIAL BIOPSY  02/25/2023   Procedure: BRONCHIAL BIOPSIES;  Surgeon: Leslye Peer, MD;  Location: [facility] ENDOSCOPY;  Service: Pulmonary;;   BRONCHIAL BRUSHINGS  02/25/2023   Procedure: BRONCHIAL BRUSHINGS;  Surgeon: Leslye Peer, MD;  Location: [facility] ENDOSCOPY;  Service: Pulmonary;;   BRONCHIAL NEEDLE ASPIRATION BIOPSY  02/25/2023   Procedure: BRONCHIAL NEEDLE ASPIRATION BIOPSIES;  Surgeon: Leslye Peer, MD;  Location: [facility] ENDOSCOPY;  Service: Pulmonary;;   BRONCHIAL WASHINGS  02/25/2023   Procedure: BRONCHIAL WASHINGS;  Surgeon: Leslye Peer, MD;  Location: [facility] ENDOSCOPY;  Service: Pulmonary;;   CARDIAC VALVE SURGERY     mitral   carpal tennel Right 2003   COLONOSCOPY     EYE SURGERY Bilateral 2015  or 2016   cataract surgery    MULTIPLE TOOTH EXTRACTIONS     upper and lower dentures   PARATHYROIDECTOMY Left 06/17/2017   Procedure: LEFT UPPER PARATHYROIDECTOMY;  Surgeon: Berna Bue, MD;  Location: [facility] ORS;  Service: General;  Laterality: Left;   TUBAL LIGATION      over 60 years ago    VIDEO BRONCHOSCOPY WITH RADIAL ENDOBRONCHIAL ULTRASOUND  02/25/2023   Procedure: VIDEO BRONCHOSCOPY WITH RADIAL ENDOBRONCHIAL ULTRASOUND;  Surgeon: Leslye Peer, MD;  Location: [facility] ENDOSCOPY;  Service: Pulmonary;;    Current Outpatient Medications  Medication Sig Dispense Refill   albuterol (PROAIR HFA) 108 (90 Base) MCG/ACT inhaler Inhale 1-2 puffs  into the lungs every 6 (six) hours as needed for wheezing or shortness of breath. 18 g 1   albuterol (VENTOLIN HFA) 108 (90 Base) MCG/ACT inhaler INHALE 1-2 PUFFS BY MOUTH EVERY 6 HOURS AS NEEDED FOR WHEEZE OR SHORTNESS OF BREATH (Patient not taking: Reported on 03/20/2023) 8.5 each 5   ALPRAZolam (XANAX) 0.5 MG tablet Take 0.5 mg by mouth 2 (two) times daily as needed for anxiety or sleep.      calcium carbonate (TUMS EX) 750 MG chewable tablet Chew 2 tablets by mouth as needed for heartburn.     cholecalciferol (VITAMIN D3) 25 MCG (1000 UNIT) tablet Take 1,000 Units by mouth daily.     diphenhydrAMINE (BENADRYL) 25 MG tablet Take 12.5 mg by mouth daily as needed for itching.     ELIQUIS 2.5 MG TABS tablet Take 2.5 mg by mouth 2 (  two) times daily.     Empagliflozin-linaGLIPtin (GLYXAMBI) 25-5 MG TABS Take 1 tablet by mouth daily.     Evolocumab (REPATHA) 140 MG/ML SOSY Inject into the skin every 14 (fourteen) days. (Patient not taking: Reported on 03/20/2023)     ezetimibe (ZETIA) 10 MG tablet Take 10 mg by mouth at bedtime.     fluticasone (FLONASE) 50 MCG/ACT nasal spray Place into both nostrils.     furosemide (LASIX) 40 MG tablet TAKE 1 TABLET BY MOUTH ONCE DAILY 90 tablet 1   HYDROcodone-acetaminophen (NORCO/VICODIN) 5-325 MG tablet Take 0.5-1 tablets by mouth at bedtime.     Insulin Glargine (BASAGLAR KWIKPEN) 100 UNIT/ML Inject 18 Units into the skin daily.     ipratropium (ATROVENT) 0.06 % nasal spray Place into both nostrils.     ipratropium-albuterol (DUONEB) 0.5-2.5 (3) MG/3ML SOLN Take 3 mLs by nebulization every 6 (six) hours as needed. 360 mL 2   magnesium gluconate (MAGONATE) 500 MG tablet Take 500 mg by mouth daily as needed (for leg cramps).  (Patient not taking: Reported on 03/20/2023)     metoprolol succinate (TOPROL XL) 25 MG 24 hr tablet Take 1 tablet (25 mg total) by mouth daily. 90 tablet 2   nystatin cream (MYCOSTATIN) 1 APPLICATION EXTERNALLY TWICE A DAY AS NEEDED FOR YEAST      omeprazole (PRILOSEC) 40 MG capsule Take 40 mg by mouth daily.       OXYGEN Inhale 1.5 L into the lungs at bedtime.      PARoxetine (PAXIL) 30 MG tablet Take 30 mg by mouth at bedtime.     tiZANidine (ZANAFLEX) 2 MG tablet Take 2 mg by mouth at bedtime as needed for muscle spasms.      valACYclovir (VALTREX) 1000 MG tablet TAKE 1 TABLET BY MOUTH EVERY 12 HOURS AS NEEDED FOR COLD SORES (Patient not taking: Reported on 03/20/2023)     No current facility-administered medications for this visit.    Allergies:   Metformin and related, Adhesive [tape], Atorvastatin, and Sulfonamide derivatives   Social History:  The patient  reports that she quit smoking about 22 years ago. Her smoking use included cigarettes. She started smoking about 72 years ago. She has a 50 pack-year smoking history. She has never used smokeless tobacco. She reports that she does not currently use alcohol. She reports that she does not use drugs.   Family History:  The patient's family history includes Anuerysm in her father; Cancer in her brother; Diabetes in her sister; Stroke in her mother; Ulcers in her sister.  ROS:  Please see the history of present illness.    All other systems are reviewed and otherwise negative.   PHYSICAL EXAM: VS:  There were no vitals taken for this visit. BMI: There is no height or weight on file to calculate BMI. Well nourished, well developed, in no acute distress  HEENT: normocephalic, atraumatic, raspy voice Neck: no JVD, carotid bruits or masses COR: RRR (paced), no significant murmurs, are appreciated no rubs, or gallops Lungs: CTA b/l, no wheezing, rhonchi or rales  Abd: soft, nontender MS: no deformity, age appropriate atrophy Ext: no edema is appreciated Skin: warm and dry, no rash Neuro:  No gross deficits appreciated today Psych: euthymic mood, full affect  ICD site is stable, no tethering or discomfort   EKG:  not done today   ICD interrogation done today and reviewed  by myself:  Battery and lead measurements are stable BP 80% No HVR episodes  05/11/22: TTE 1. Left ventricular ejection fraction, by estimation, is 40 to 45%. Left  ventricular ejection fraction by 2D MOD biplane is 43.3 %. The left  ventricle has mildly decreased function. The left ventricle demonstrates  global hypokinesis. Left ventricular  diastolic function could not be evaluated.   2. Right ventricular systolic function is mildly reduced. The right  ventricular size is normal.   3. Right atrial size was mildly dilated.   4. The mitral valve has been repaired/replaced. Trivial mitral valve  regurgitation. No evidence of mitral stenosis. There is a unknown size  prosthetic prosthetic annuloplasty ring present in the mitral position.   5. The aortic valve is tricuspid. There is mild calcification of the  aortic valve. Aortic valve regurgitation is not visualized. Aortic valve  sclerosis/calcification is present, without any evidence of aortic  stenosis. Aortic valve mean gradient  measures 10.0 mmHg.   6. The inferior vena cava is normal in size with greater than 50%  respiratory variability, suggesting right atrial pressure of 3 mmHg.   Comparison(s): Changes from prior study are noted. 07/06/2021: LVEF 60-65%.    11/15/14: TTE Study Conclusions - Left ventricle: The cavity size was normal. Wall thickness was   normal. Systolic function was normal. The estimated ejection   fraction was in the range of 60% to 65%. Wall motion was normal;   there were no regional wall motion abnormalities. Previous mitral   surgery and the presence of atrial fibrillation prevents   evaluation of LV diastolic function. - Mitral valve: Prior procedures included surgical repair. An   annular ring prosthesis was present. Valve area by pressure   half-time: 1.71 cm^2. Valve area by continuity equation (using   LVOT flow): 2.09 cm^2. - Left atrium: The atrium was mildly dilated. - Pulmonary  arteries: Systolic pressure was mildly increased. PA   peak pressure: 39 mm Hg (S).  2015: LVEF 50-55% 2010: LVEF 35%  Recent Labs: 05/12/2022: Magnesium 2.3 04/21/2023: ALT 15; BUN 22; Creatinine, Ser 1.08; Hemoglobin 12.0; Platelets 287; Potassium 3.5; Sodium 140  05/11/2022: Cholesterol 266; HDL 46; LDL Cholesterol 177; Total CHOL/HDL Ratio 5.8; Triglycerides 214; VLDL 43   Estimated Creatinine Clearance: 41.9 mL/min (A) (by C-G formula based on SCr of 1.08 mg/dL (H)).   Wt Readings from Last 3 Encounters:  04/21/23 181 lb (82.1 kg)  03/20/23 180 lb 3.2 oz (81.7 kg)  02/25/23 182 lb (82.6 kg)     Other studies reviewed: Additional studies/records reviewed today include: summarized above  ASSESSMENT AND PLAN:  1. ICD     Intact function      Base pacing rate increased to 75 in effort to increased BP%  2. Hx of CM  3. Chronic CHF LVEF down again some to 40-45% (60-65% in 2023)     appears euvolemic by exam      CorVue looks good above threshold and trending up  Chronic low BP% With her marked orthostatic BP drops do not think we can push on her BB Might consider dig if needed Increased base pacing rate to 75 today            4. VHD w/Hx of MV repair     stable by last echo  5. Paroxysmal >>> permanent AFib, hx of PE 6. Secondary hypercoagulable state     CHA2DS2Vasc is 7, on Eliquis, inappropriately dosed for weight/renal function     She has had by chart record LAA ligation     Though in 2024  she had a TIA with subtherapeutic INR  They report large volume nose bleeds that required intervention last year when on full dose Eliquis They did see ENT  Did nasal sprays, humidified O2, and her eliquis dose was reduced without recurrent bleeding   7. Weak spells some associated with AMS and R sided weakness  LONG discussion today They worry she may be having mini-strokes, they could be right Confirmed today with industry rep that her CRT-P system is MRI conditional  should her PMD want to pursue further brain imaging She has only been taking 2.5mg  eliquis once daily  She also had markedly + orthostatic BP drops again when in the ER Given IVFs  She also has high BP She has L subclavian artery stenosis though today her BP are equal on both sides by myself 160/80 I think that this is probably a good number for her, did have worsened L PCA stenosis (neurology did not feel participated in her symptoms) Discussed difficulty in treating HTN without making her orthostatic changes worse. She has started to wear support stockings  We will resume Eliquis 5mg  BID (appropriate for her weight and Creat) If she has recurrent nose bleeds will need to revisit ENT, consider xarelto  Reduce lasix to 20mg  daily (with as needed 20mg  for edema, volume OL)        Disposition: back in 1 mo, sooner if needed, discussed the importance of follow up  Total time spent today    Current medicines are reviewed at length with the patient today.  The patient did not have any concerns regarding medicines  Signed, Francis Dowse, PA-C 04/29/2023 5:32 PM     [facility] [address] [place] [address] [phone] (office)  [phone] (fax)

## 2023-04-30 ENCOUNTER — Other Ambulatory Visit: Payer: Self-pay

## 2023-04-30 ENCOUNTER — Ambulatory Visit: Attending: Physician Assistant | Admitting: Physician Assistant

## 2023-04-30 ENCOUNTER — Encounter: Payer: Self-pay | Admitting: Physician Assistant

## 2023-04-30 ENCOUNTER — Ambulatory Visit
Admission: RE | Admit: 2023-04-30 | Discharge: 2023-04-30 | Disposition: A | Discharge status: Home / Self Care | Payer: PPO | Source: Ambulatory Visit | Attending: Radiation Oncology | Admitting: Radiation Oncology

## 2023-04-30 VITALS — BP 144/62 | HR 98 | Ht 66.0 in | Wt 177.8 lb

## 2023-04-30 DIAGNOSIS — C3411 Malignant neoplasm of upper lobe, right bronchus or lung: Secondary | ICD-10-CM | POA: Diagnosis not present

## 2023-04-30 DIAGNOSIS — Z9889 Other specified postprocedural states: Secondary | ICD-10-CM | POA: Diagnosis not present

## 2023-04-30 DIAGNOSIS — I4821 Permanent atrial fibrillation: Secondary | ICD-10-CM | POA: Diagnosis not present

## 2023-04-30 DIAGNOSIS — R55 Syncope and collapse: Secondary | ICD-10-CM

## 2023-04-30 DIAGNOSIS — R42 Dizziness and giddiness: Secondary | ICD-10-CM | POA: Diagnosis not present

## 2023-04-30 DIAGNOSIS — D6869 Other thrombophilia: Secondary | ICD-10-CM | POA: Diagnosis not present

## 2023-04-30 DIAGNOSIS — I428 Other cardiomyopathies: Secondary | ICD-10-CM

## 2023-04-30 DIAGNOSIS — Z950 Presence of cardiac pacemaker: Secondary | ICD-10-CM

## 2023-04-30 LAB — CUP PACEART INCLINIC DEVICE CHECK
Battery Remaining Longevity: 32 mo
Battery Voltage: 2.95 V
Brady Statistic RA Percent Paced: 0 %
Brady Statistic RV Percent Paced: 80 %
Date Time Interrogation Session: 20250311175152
Implantable Lead Connection Status: 753985
Implantable Lead Connection Status: 753985
Implantable Lead Connection Status: 753985
Implantable Lead Implant Date: 20110127
Implantable Lead Implant Date: 20110127
Implantable Lead Implant Date: 20110127
Implantable Lead Location: 753858
Implantable Lead Location: 753859
Implantable Lead Location: 753860
Implantable Lead Model: 7122
Implantable Pulse Generator Implant Date: 20190418
Lead Channel Impedance Value: 637.5 Ohm
Lead Channel Impedance Value: 862.5 Ohm
Lead Channel Pacing Threshold Amplitude: 0.75 V
Lead Channel Pacing Threshold Amplitude: 0.75 V
Lead Channel Pacing Threshold Amplitude: 0.75 V
Lead Channel Pacing Threshold Amplitude: 0.75 V
Lead Channel Pacing Threshold Pulse Width: 0.5 ms
Lead Channel Pacing Threshold Pulse Width: 0.5 ms
Lead Channel Pacing Threshold Pulse Width: 0.6 ms
Lead Channel Pacing Threshold Pulse Width: 0.6 ms
Lead Channel Sensing Intrinsic Amplitude: 0.6 mV
Lead Channel Sensing Intrinsic Amplitude: 12 mV
Lead Channel Setting Pacing Amplitude: 2 V
Lead Channel Setting Pacing Amplitude: 2.5 V
Lead Channel Setting Pacing Pulse Width: 0.5 ms
Lead Channel Setting Pacing Pulse Width: 0.6 ms
Lead Channel Setting Sensing Sensitivity: 2 mV
Pulse Gen Model: 3222
Pulse Gen Serial Number: 9393517

## 2023-04-30 LAB — RAD ONC ARIA SESSION SUMMARY
Course Elapsed Days: 5
Plan Fractions Treated to Date: 2
Plan Prescribed Dose Per Fraction: 18 Gy
Plan Total Fractions Prescribed: 3
Plan Total Prescribed Dose: 54 Gy
Reference Point Dosage Given to Date: 36 Gy
Reference Point Session Dosage Given: 18 Gy
Session Number: 2

## 2023-04-30 MED ORDER — APIXABAN 5 MG PO TABS: 5.0000 mg | ORAL_TABLET | Freq: Two times a day (BID) | ORAL | 2 refills | Status: AC

## 2023-04-30 MED ORDER — FUROSEMIDE 20 MG PO TABS: 20.0000 mg | ORAL_TABLET | Freq: Every day | ORAL | 2 refills | Status: DC

## 2023-04-30 NOTE — Patient Instructions (Signed)
 Medication Instructions:   START TAKING : ELIQUIS 5 MG TWICE A DAY   START TAKING :  LASIX 20 MG ONCE A DAY   *If you need a refill on your cardiac medications before your next appointment, please call your pharmacy*   Lab Work: NONE ORDERED  TODAY    If you have labs (blood work) drawn today and your tests are completely normal, you will receive your results only by: MyChart Message (if you have MyChart) OR A paper copy in the mail If you have any lab test that is abnormal or we need to change your treatment, we will call you to review the results.   Testing/Procedures: NONE ORDERED  TODAY     Follow-Up: At [facility], you and your health needs are our priority.  As part of our continuing mission to provide you with exceptional heart care, we have created designated Provider Care Teams.  These Care Teams include your primary Cardiologist (physician) and Advanced Practice Providers (APPs -  Physician Assistants and Nurse Practitioners) who all work together to provide you with the care you need, when you need it.  We recommend signing up for the patient portal called "MyChart".  Sign up information is provided on this After Visit Summary.  MyChart is used to connect with patients for Virtual Visits (Telemedicine).  Patients are able to view lab/test results, encounter notes, upcoming appointments, etc.  Non-urgent messages can be sent to your provider as well.   To learn more about what you can do with MyChart, go to [email or web address].     Your next appointment:    1 month(s) ( CONTACT  CASSIE HALL/ ANGELINE HAMMER FOR EP SCHEDULING ISSUES )     Provider:   You may see Dr. Ladona Ridgel  or one of the following Advanced Practice Providers on your designated Care Team:   Francis Dowse, PA-C   Other Instructions    1st Floor: - Lobby - Registration  - Pharmacy  - Lab - Cafe  2nd Floor: - PV Lab - Diagnostic Testing (echo, CT, nuclear med)  3rd  Floor: - Vacant  4th Floor: - TCTS (cardiothoracic surgery) - AFib Clinic - Structural Heart Clinic - Vascular Surgery  - Vascular Ultrasound  5th Floor: - [facility]Care Cardiology (general and EP) - Clinical Pharmacy for coumadin, hypertension, lipid, weight-loss medications, and med management appointments    Valet parking services will be available as well.

## 2023-05-01 NOTE — Addendum Note (Signed)
 Addended by: Elease Etienne A on: 05/01/2023 10:16 AM   Modules accepted: Orders

## 2023-05-01 NOTE — Progress Notes (Signed)
 Remote pacemaker transmission.

## 2023-05-02 ENCOUNTER — Ambulatory Visit
Admission: RE | Admit: 2023-05-02 | Discharge: 2023-05-02 | Disposition: A | Discharge status: Home / Self Care | Payer: PPO | Source: Ambulatory Visit | Attending: Radiation Oncology | Admitting: Radiation Oncology

## 2023-05-02 ENCOUNTER — Other Ambulatory Visit: Payer: Self-pay

## 2023-05-02 DIAGNOSIS — Z87891 Personal history of nicotine dependence: Secondary | ICD-10-CM | POA: Diagnosis not present

## 2023-05-02 DIAGNOSIS — C3411 Malignant neoplasm of upper lobe, right bronchus or lung: Secondary | ICD-10-CM

## 2023-05-02 DIAGNOSIS — Z510 Encounter for antineoplastic radiation therapy: Secondary | ICD-10-CM | POA: Diagnosis not present

## 2023-05-02 LAB — RAD ONC ARIA SESSION SUMMARY
Course Elapsed Days: 7
Plan Fractions Treated to Date: 3
Plan Prescribed Dose Per Fraction: 18 Gy
Plan Total Fractions Prescribed: 3
Plan Total Prescribed Dose: 54 Gy
Reference Point Dosage Given to Date: 54 Gy
Reference Point Session Dosage Given: 18 Gy
Session Number: 3

## 2023-05-03 NOTE — Radiation Completion Notes (Addendum)
  Radiation Oncology         [phone] ________________________________  Name: Emily Abbott MRN: [id number]  Date of Service: 05/02/2023  DOB: [birth year]  End of Treatment Note     Diagnosis: Stage IA3 (cT1c, cN0, cM0) Poorly differentiated squamous cell carcinoma in the right upper lung  Intent: Curative     ==========DELIVERED PLANS==========  First Treatment Date: 2023-04-25 Last Treatment Date: 2023-05-02   Plan Name: Lung_R_SBRT Site: Lung, Right Technique: SBRT/SRT-IMRT Mode: Photon Dose Per Fraction: 18 Gy Prescribed Dose (Delivered / Prescribed): 54 Gy / 54 Gy Prescribed Fxs (Delivered / Prescribed): 3 / 3     ====================================   The patient tolerated radiation without any significant acute effects and was discharged to home in stable condition.   The patient will return in one month for routine follow-up.      Joyice Faster, PA-C

## 2023-05-27 ENCOUNTER — Encounter: Payer: Self-pay | Admitting: Radiation Oncology

## 2023-05-29 NOTE — Progress Notes (Signed)
 Radiation Oncology         [phone] ________________________________  Name: Emily Abbott MRN: [id number]  Date: 05/30/2023  DOB: [birth year]  Follow-Up Visit Note  CC: Victorio Grave, MD  Victorio Grave, MD    ICD-10-CM   1. 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 She was last seen in office on 03/20/23. Since then, patient completed her radiation treatment which she tolerated quite well without any significant side effects. Patient continued to follow up with their specialists to manage their chronic conditions.   Since then, patient presented to the ED on 04/21/23 with complains of dizziness. ED course included: --CTA head and neck which did not show any acute worsening of her left subclavian artery stenosis.  Remains stable from prior exam; a 1.6 cm right upper lobe pulmonary nodule along with an additional 1.4 x 0.8 cm nodule positioned just peripherally. 7 mm nodule just superiorly is new and/or increased from prior. --CT head showed no evidence for acute intracranial abnormality.   No other significant oncologic interval history since the patient was last seen.   Patient reports to be doing well overall today. She notes an intermittent burning sensation underneath her right arm. This began after she completed her radiation treatment. She notes shortness of breath at baseline, but denies any cough, hemoptysis, or any other  changes to her breathing.                               Allergies:  is allergic to metformin and related, adhesive [tape], atorvastatin, and sulfonamide derivatives.  Meds: Current Outpatient Medications  Medication Sig Dispense Refill   albuterol (PROAIR HFA) 108 (90 Base) MCG/ACT inhaler Inhale 1-2 puffs into the lungs every 6 (six) hours as needed for wheezing or shortness of breath. 18 g 1   ALPRAZolam (XANAX) 0.5 MG tablet Take 0.5 mg by mouth 2 (two) times daily as needed for anxiety or sleep.      apixaban (ELIQUIS) 5 MG TABS tablet Take 1 tablet (5 mg total) by mouth 2 (two) times daily. 180 tablet 2   calcium carbonate (TUMS EX) 750 MG chewable tablet Chew 2 tablets by mouth as needed for heartburn.     cholecalciferol (VITAMIN D3) 25 MCG (1000 UNIT) tablet Take 1,000 Units by mouth as needed (when she remember).     diphenhydrAMINE (BENADRYL) 25 MG tablet Take 12.5 mg by mouth daily as needed for itching.     ELIQUIS 2.5 MG TABS tablet Take 2.5 mg by mouth 2 (two) times daily.     Empagliflozin-linaGLIPtin (GLYXAMBI) 25-5 MG TABS Take 1 tablet by mouth daily.     ezetimibe (ZETIA) 10 MG tablet Take 10 mg by mouth at bedtime.     fluticasone (FLONASE) 50 MCG/ACT nasal spray Place into both nostrils.     furosemide (LASIX) 20 MG tablet Take 1 tablet (  20 mg total) by mouth daily. 90 tablet 2   HYDROcodone-acetaminophen (NORCO/VICODIN) 5-325 MG tablet Take 0.5-1 tablets by mouth at bedtime.     Insulin Glargine (BASAGLAR KWIKPEN) 100 UNIT/ML Inject 18 Units into the skin daily.     ipratropium (ATROVENT) 0.06 % nasal spray Place into both nostrils.     ipratropium-albuterol (DUONEB) 0.5-2.5 (3) MG/3ML SOLN Take 3 mLs by nebulization every 6 (six) hours as needed. 360 mL 2   magnesium gluconate (MAGONATE) 500 MG tablet Take 500 mg by mouth daily as needed (for leg cramps).     metoprolol succinate (TOPROL XL) 25 MG 24 hr tablet Take 1 tablet (25 mg total) by mouth daily. 90 tablet 2    nystatin cream (MYCOSTATIN) 1 APPLICATION EXTERNALLY TWICE A DAY AS NEEDED FOR YEAST     omeprazole (PRILOSEC) 40 MG capsule Take 40 mg by mouth daily.       OXYGEN Inhale 1.5 L into the lungs at bedtime.      PARoxetine (PAXIL) 30 MG tablet Take 30 mg by mouth at bedtime.     tiZANidine (ZANAFLEX) 2 MG tablet Take 2 mg by mouth at bedtime as needed for muscle spasms.      No current facility-administered medications for this encounter.    Physical Findings: The patient is in no acute distress. Patient is alert and oriented.  height is 5\' 6"  (1.676 m) and weight is 179 lb 10 oz (81.5 kg). Her temporal temperature is 97.5 F (36.4 C) (abnormal). Her blood pressure is 160/81 (abnormal) and her pulse is 75. Her respiration is 20 and oxygen saturation is 95%. .  No significant changes. Lungs are clear to auscultation bilaterally. Heart has regular rate and rhythm. No palpable cervical, supraclavicular, or axillary adenopathy. Abdomen soft, non-tender, normal bowel sounds. No abnormalities appreciated in the right axilla when compared to the left.    Lab Findings: Lab Results  Component Value Date   WBC 10.3 04/21/2023   HGB 12.0 04/21/2023   HCT 39.4 04/21/2023   MCV 76.8 (L) 04/21/2023   PLT 287 04/21/2023    Radiographic Findings: No results found.   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  Per NCCN guidelines, patient will receive CT of the chest in 3 months.  We will see the patient following imaging to review the results.  She was encouraged to call with any questions or concerns in the meantime.   20 minutes of total time was spent for this patient encounter, including preparation, face-to-face counseling with the patient and coordination of care, physical exam, and documentation of the encounter. ____________________________________   Julio Ohm, PA-C   This document serves as a record of services personally performed by Julio Ohm, PA-C. It was created on his behalf by Lucky Sable, a trained medical scribe. The creation of this record is based on the scribe's personal observations and the provider's statements to them. This document has been checked and approved by the attending provider.

## 2023-05-30 ENCOUNTER — Ambulatory Visit
Admission: RE | Admit: 2023-05-30 | Discharge: 2023-05-30 | Disposition: A | Discharge status: Home / Self Care | Source: Ambulatory Visit | Attending: Radiation Oncology | Admitting: Radiation Oncology

## 2023-05-30 ENCOUNTER — Encounter: Payer: Self-pay | Admitting: Radiation Oncology

## 2023-05-30 VITALS — BP 160/81 | HR 75 | Temp 97.5°F | Resp 20 | Ht 66.0 in | Wt 179.6 lb

## 2023-05-30 DIAGNOSIS — C3411 Malignant neoplasm of upper lobe, right bronchus or lung: Secondary | ICD-10-CM

## 2023-05-30 HISTORY — DX: Personal history of irradiation: Z92.3

## 2023-05-30 NOTE — Progress Notes (Signed)
 Emily Abbott is here today for follow up post radiation to the lung.  Lung Side: Right  Does the patient complain of any of the following: Pain: She reports a burning sensation under right arm. Shortness of breath w/wo exertion: She reports always being short of breath. Cough: Denies Hemoptysis: Denies Pain with swallowing: Denies Swallowing/choking concerns: Denies Appetite: Good Energy Level: Low Post radiation skin Changes: She reports a burning sensation under right arm.  Additional comments if applicable: She would like to know when the next CT scan is.  BP (!) 160/81 (BP Location: Left Arm, Patient Position: Sitting)   Pulse 75   Temp (!) 97.5 F (36.4 C) (Temporal)   Resp 20   Ht 5\' 6"  (1.676 m)   Wt 179 lb 10 oz (81.5 kg)   SpO2 95%   BMI 28.99 kg/m

## 2023-06-28 ENCOUNTER — Ambulatory Visit (INDEPENDENT_AMBULATORY_CARE_PROVIDER_SITE_OTHER): Payer: Medicare HMO

## 2023-06-28 DIAGNOSIS — I428 Other cardiomyopathies: Secondary | ICD-10-CM | POA: Diagnosis not present

## 2023-06-28 LAB — CUP PACEART REMOTE DEVICE CHECK
Battery Remaining Longevity: 29 mo
Battery Remaining Percentage: 30 %
Battery Voltage: 2.95 V
Date Time Interrogation Session: 20250509054851
Implantable Lead Connection Status: 753985
Implantable Lead Connection Status: 753985
Implantable Lead Connection Status: 753985
Implantable Lead Implant Date: 20110127
Implantable Lead Implant Date: 20110127
Implantable Lead Implant Date: 20110127
Implantable Lead Location: 753858
Implantable Lead Location: 753859
Implantable Lead Location: 753860
Implantable Lead Model: 7122
Implantable Pulse Generator Implant Date: 20190418
Lead Channel Impedance Value: 650 Ohm
Lead Channel Impedance Value: 910 Ohm
Lead Channel Pacing Threshold Amplitude: 0.75 V
Lead Channel Pacing Threshold Amplitude: 1 V
Lead Channel Pacing Threshold Pulse Width: 0.5 ms
Lead Channel Pacing Threshold Pulse Width: 0.6 ms
Lead Channel Sensing Intrinsic Amplitude: 12 mV
Lead Channel Setting Pacing Amplitude: 2 V
Lead Channel Setting Pacing Amplitude: 2.5 V
Lead Channel Setting Pacing Pulse Width: 0.5 ms
Lead Channel Setting Pacing Pulse Width: 0.6 ms
Lead Channel Setting Sensing Sensitivity: 2 mV
Pulse Gen Model: 3222
Pulse Gen Serial Number: 9393517

## 2023-07-01 ENCOUNTER — Telehealth: Payer: Self-pay | Admitting: *Deleted

## 2023-07-01 ENCOUNTER — Encounter: Payer: Self-pay | Admitting: Internal Medicine

## 2023-07-01 NOTE — Telephone Encounter (Signed)
 CALLED PATIENT TO INFORM OF CT FOR 08/29/23 @ [facility]- ARRIVAL TIME- 11:45 AM, NO RESTRICTIONS TO SCAN, PATIENT TO RECEIVE RESULTS FROM DR. KINARD ON 09/05/23 @ 11 AM, LVM FOR A RETURN CALL

## 2023-07-08 ENCOUNTER — Other Ambulatory Visit: Payer: Self-pay | Admitting: Family Medicine

## 2023-07-08 DIAGNOSIS — I480 Paroxysmal atrial fibrillation: Secondary | ICD-10-CM | POA: Diagnosis not present

## 2023-07-08 DIAGNOSIS — F5101 Primary insomnia: Secondary | ICD-10-CM | POA: Diagnosis not present

## 2023-07-08 DIAGNOSIS — E559 Vitamin D deficiency, unspecified: Secondary | ICD-10-CM | POA: Diagnosis not present

## 2023-07-08 DIAGNOSIS — Z23 Encounter for immunization: Secondary | ICD-10-CM | POA: Diagnosis not present

## 2023-07-08 DIAGNOSIS — E1121 Type 2 diabetes mellitus with diabetic nephropathy: Secondary | ICD-10-CM | POA: Diagnosis not present

## 2023-07-08 DIAGNOSIS — J309 Allergic rhinitis, unspecified: Secondary | ICD-10-CM | POA: Diagnosis not present

## 2023-07-08 DIAGNOSIS — G894 Chronic pain syndrome: Secondary | ICD-10-CM | POA: Diagnosis not present

## 2023-07-08 DIAGNOSIS — R443 Hallucinations, unspecified: Secondary | ICD-10-CM

## 2023-07-08 DIAGNOSIS — Z0000 Encounter for general adult medical examination without abnormal findings: Secondary | ICD-10-CM | POA: Diagnosis not present

## 2023-07-08 DIAGNOSIS — I129 Hypertensive chronic kidney disease with stage 1 through stage 4 chronic kidney disease, or unspecified chronic kidney disease: Secondary | ICD-10-CM | POA: Diagnosis not present

## 2023-07-08 DIAGNOSIS — E785 Hyperlipidemia, unspecified: Secondary | ICD-10-CM | POA: Diagnosis not present

## 2023-07-08 DIAGNOSIS — E213 Hyperparathyroidism, unspecified: Secondary | ICD-10-CM | POA: Diagnosis not present

## 2023-07-08 DIAGNOSIS — M170 Bilateral primary osteoarthritis of knee: Secondary | ICD-10-CM | POA: Diagnosis not present

## 2023-07-16 DIAGNOSIS — R030 Elevated blood-pressure reading, without diagnosis of hypertension: Secondary | ICD-10-CM | POA: Diagnosis not present

## 2023-07-16 DIAGNOSIS — J069 Acute upper respiratory infection, unspecified: Secondary | ICD-10-CM | POA: Diagnosis not present

## 2023-07-16 DIAGNOSIS — J441 Chronic obstructive pulmonary disease with (acute) exacerbation: Secondary | ICD-10-CM | POA: Diagnosis not present

## 2023-08-02 NOTE — Progress Notes (Signed)
 Remote pacemaker transmission.

## 2023-08-29 ENCOUNTER — Ambulatory Visit (HOSPITAL_COMMUNITY)
Admission: RE | Admit: 2023-08-29 | Discharge: 2023-08-29 | Disposition: A | Discharge status: Home / Self Care | Source: Ambulatory Visit | Attending: Radiology | Admitting: Radiology

## 2023-08-29 DIAGNOSIS — J984 Other disorders of lung: Secondary | ICD-10-CM | POA: Diagnosis not present

## 2023-08-29 DIAGNOSIS — R918 Other nonspecific abnormal finding of lung field: Secondary | ICD-10-CM | POA: Diagnosis not present

## 2023-08-29 DIAGNOSIS — D3501 Benign neoplasm of right adrenal gland: Secondary | ICD-10-CM | POA: Diagnosis not present

## 2023-08-29 DIAGNOSIS — C3411 Malignant neoplasm of upper lobe, right bronchus or lung: Secondary | ICD-10-CM | POA: Insufficient documentation

## 2023-09-04 NOTE — Progress Notes (Incomplete)
 Radiation Oncology         [phone]) [phone] ________________________________  Name: Emily Abbott MRN: [id number]  Date: 09/05/2023  DOB: 04-Dec-1940  Follow-Up Visit Note  CC: Teresa Channel, MD  Teresa Channel, MD  No diagnosis found.  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 She was last seen in office on 05/30/23 for a follow up visit. Patient continued to follow up with their specialists to manage their chronic conditions.   In the interval since she was last seen, she presented for a CT chest on 08/29/23 showing newly prominent pretracheal and right hilar lymph nodes, pretracheal nodes measuring up to 1.8 x 1.3 cm, previously 1.4 x 0.9 cm.   Interval decrease in size of a right upper lobe nodule, measuring 1.4 x 0.8 cm with extensive new heterogeneous ground-glass and consolidative airspace opacity in a bandlike distribution within the surrounding right upper lobe and additional nodule in the anterior inferior right upper lobe unchanged at 1.1 x 0.9 cm. Scan also indicated nodule of the lateral segment right middle lobe is unchanged at 0.7 cm.   No other significant oncologic interval history since the patient was last seen.                                    Allergies:  is allergic to metformin and related, adhesive [tape], atorvastatin , and sulfonamide derivatives.  Meds: Current Outpatient Medications  Medication Sig Dispense Refill   albuterol  (PROAIR  HFA) 108 (90  Base) MCG/ACT inhaler Inhale 1-2 puffs into the lungs every 6 (six) hours as needed for wheezing or shortness of breath. 18 g 1   ALPRAZolam  (XANAX ) 0.5 MG tablet Take 0.5 mg by mouth 2 (two) times daily as needed for anxiety or sleep.      apixaban  (ELIQUIS ) 5 MG TABS tablet Take 1 tablet (5 mg total) by mouth 2 (two) times daily. 180 tablet 2   calcium  carbonate (TUMS EX) 750 MG chewable tablet Chew 2 tablets by mouth as needed for heartburn.     cholecalciferol (VITAMIN D3) 25 MCG (1000 UNIT) tablet Take 1,000 Units by mouth as needed (when she remember).     diphenhydrAMINE  (BENADRYL ) 25 MG tablet Take 12.5 mg by mouth daily as needed for itching.     ELIQUIS  2.5 MG TABS tablet Take 2.5 mg by mouth 2 (two) times daily.     Empagliflozin-linaGLIPtin  (GLYXAMBI) 25-5 MG TABS Take 1 tablet by mouth daily.     ezetimibe  (ZETIA ) 10 MG tablet Take 10 mg by mouth at bedtime.     fluticasone  (FLONASE) 50 MCG/ACT nasal spray Place into both nostrils.     furosemide  (LASIX ) 20 MG tablet Take 1 tablet (20 mg total) by mouth daily. 90 tablet 2   HYDROcodone -acetaminophen  (NORCO/VICODIN) 5-325 MG tablet Take 0.5-1 tablets by mouth at bedtime.     Insulin  Glargine (BASAGLAR  KWIKPEN) 100 UNIT/ML Inject 18 Units into the skin daily.  ipratropium (ATROVENT) 0.06 % nasal spray Place into both nostrils.     ipratropium-albuterol  (DUONEB) 0.5-2.5 (3) MG/3ML SOLN Take 3 mLs by nebulization every 6 (six) hours as needed. 360 mL 2   magnesium gluconate (MAGONATE) 500 MG tablet Take 500 mg by mouth daily as needed (for leg cramps).     metoprolol  succinate (TOPROL  XL) 25 MG 24 hr tablet Take 1 tablet (25 mg total) by mouth daily. 90 tablet 2   nystatin cream (MYCOSTATIN) 1 APPLICATION EXTERNALLY TWICE A DAY AS NEEDED FOR YEAST     omeprazole (PRILOSEC) 40 MG capsule Take 40 mg by mouth daily.       OXYGEN  Inhale 1.5 L into the lungs at bedtime.      PARoxetine  (PAXIL ) 30 MG tablet Take 30 mg by mouth at bedtime.      tiZANidine  (ZANAFLEX ) 2 MG tablet Take 2 mg by mouth at bedtime as needed for muscle spasms.      No current facility-administered medications for this encounter.    Physical Findings: The patient is in no acute distress. Patient is alert and oriented.  vitals were not taken for this visit. .  No significant changes. Lungs are clear to auscultation bilaterally. Heart has regular rate and rhythm. No palpable cervical, supraclavicular, or axillary adenopathy. Abdomen soft, non-tender, normal bowel sounds.   Lab Findings: Lab Results  Component Value Date   WBC 10.3 04/21/2023   HGB 12.0 04/21/2023   HCT 39.4 04/21/2023   MCV 76.8 (L) 04/21/2023   PLT 287 04/21/2023    Radiographic Findings: CT CHEST WO CONTRAST Result Date: 09/02/2023 CLINICAL DATA:  Lung cancer restaging * Tracking Code: BO * EXAM: CT CHEST WITHOUT CONTRAST TECHNIQUE: Multidetector CT imaging of the chest was performed following the standard protocol without IV contrast. RADIATION DOSE REDUCTION: This exam was performed according to the departmental dose-optimization program which includes automated exposure control, adjustment of the mA and/or kV according to patient size and/or use of iterative reconstruction technique. COMPARISON:  PET-CT, 02/15/2023 FINDINGS: Cardiovascular: Aortic atherosclerosis. Left chest multi lead pacer defibrillator. Normal heart size. Three-vessel coronary artery calcifications. No pericardial effusion. Mediastinum/Nodes: Newly prominent pretracheal and right hilar lymph nodes, pretracheal nodes measuring up to 1.8 x 1.3 cm, previously 1.4 x 0.9 cm (series 2, image 62). Thyroid  gland, trachea, and esophagus demonstrate no significant findings. Lungs/Pleura: Interval decrease in size of a right upper lobe nodule, measuring 1.4 x 0.8 cm with extensive new heterogeneous ground-glass and consolidative airspace opacity in a bandlike distribution within the surrounding right upper lobe (series 7, image  52). Additional nodule in the anterior inferior right upper lobe unchanged at 1.1 x 0.9 cm (series 7, image 80). Nodule of the lateral segment right middle lobe unchanged at 0.7 cm (series 7, image 107). Background innumerable centrilobular nodules throughout the lungs unchanged, generally very tiny. No pleural effusion or pneumothorax. Upper Abdomen: No acute abnormality. Benign, macroscopic fat containing right adrenal adenoma for which no further follow-up or characterization is required. Musculoskeletal: No chest wall abnormality. No acute osseous findings. IMPRESSION: 1. Interval decrease in size of a previously hypermetabolic right upper lobe nodule, with extensive heterogeneous ground-glass and consolidative airspace opacity in a bandlike distribution within the surrounding right upper lobe. Findings are consistent with treatment response of primary lung malignancy and developing radiation pneumonitis. 2. Multiple additional nodules unchanged and presumed benign. Background innumerable centrilobular nodules throughout the lungs unchanged, generally very tiny, generally most consistent with smoking related respiratory bronchiolitis. 3. Newly prominent  pretracheal and right hilar lymph nodes, most likely reactive, although continued attention on follow-up warranted. 4. Coronary artery disease. Aortic Atherosclerosis (ICD10-I70.0). Electronically Signed   By: Marolyn JONETTA Jaksch M.D.   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  ***  Plan:  ***   *** minutes of total time was spent for this patient encounter, including preparation, face-to-face counseling with the patient and coordination of care, physical exam, and documentation of the encounter. ____________________________________  Lynwood CHARM Nasuti, PhD, MD  This document serves as a record of services  personally performed by Lynwood Nasuti, MD. It was created on his behalf by Reymundo Cartwright, a trained medical scribe. The creation of this record is based on the scribe's personal observations and the provider's statements to them. This document has been checked and approved by the attending provider.

## 2023-09-05 ENCOUNTER — Ambulatory Visit
Admission: RE | Admit: 2023-09-05 | Discharge: 2023-09-05 | Disposition: A | Discharge status: Home / Self Care | Payer: Self-pay | Source: Ambulatory Visit | Attending: Radiation Oncology | Admitting: Radiation Oncology

## 2023-09-05 DIAGNOSIS — C3411 Malignant neoplasm of upper lobe, right bronchus or lung: Secondary | ICD-10-CM

## 2023-09-05 NOTE — Progress Notes (Incomplete)
 Emily Abbott is here today for follow up post radiation to the lung.  Lung Side: Right, patient completed treatment on 05/02/23  Does the patient complain of any of the following: Pain:*** Shortness of breath w/wo exertion: *** Cough: *** Hemoptysis: *** Pain with swallowing: *** Swallowing/choking concerns: *** Appetite: *** Energy Level: *** Post radiation skin Changes: ***    Additional comments if applicable:

## 2023-09-23 ENCOUNTER — Ambulatory Visit: Admitting: Radiation Oncology

## 2023-09-27 ENCOUNTER — Ambulatory Visit (INDEPENDENT_AMBULATORY_CARE_PROVIDER_SITE_OTHER): Payer: Medicare HMO

## 2023-09-27 DIAGNOSIS — I428 Other cardiomyopathies: Secondary | ICD-10-CM

## 2023-09-27 LAB — CUP PACEART REMOTE DEVICE CHECK
Battery Remaining Longevity: 26 mo
Battery Remaining Percentage: 27 %
Battery Voltage: 2.93 V
Date Time Interrogation Session: 20250808042753
Implantable Lead Connection Status: 753985
Implantable Lead Connection Status: 753985
Implantable Lead Connection Status: 753985
Implantable Lead Implant Date: 20110127
Implantable Lead Implant Date: 20110127
Implantable Lead Implant Date: 20110127
Implantable Lead Location: 753858
Implantable Lead Location: 753859
Implantable Lead Location: 753860
Implantable Lead Model: 7122
Implantable Pulse Generator Implant Date: 20190418
Lead Channel Impedance Value: 610 Ohm
Lead Channel Impedance Value: 860 Ohm
Lead Channel Pacing Threshold Amplitude: 0.75 V
Lead Channel Pacing Threshold Amplitude: 1 V
Lead Channel Pacing Threshold Pulse Width: 0.5 ms
Lead Channel Pacing Threshold Pulse Width: 0.6 ms
Lead Channel Sensing Intrinsic Amplitude: 12 mV
Lead Channel Setting Pacing Amplitude: 2 V
Lead Channel Setting Pacing Amplitude: 2.5 V
Lead Channel Setting Pacing Pulse Width: 0.5 ms
Lead Channel Setting Pacing Pulse Width: 0.6 ms
Lead Channel Setting Sensing Sensitivity: 2 mV
Pulse Gen Model: 3222
Pulse Gen Serial Number: 9393517

## 2023-09-27 NOTE — Progress Notes (Signed)
  Radiation Oncology         [phone]  _______________________________  Mercer LELON Moats returns today for routine follow-up and to review most recent imaging.   Lung Side: Right side, she completed her radion treatment on  2023-05-02.  Does the patient complain of any of the following: Pain: Lower back pain that radiates to right abdomen 5/10 at night Shortness of breath w/wo exertion: Denies  Cough: Intermittently due to mucus in throat. Hemoptysis: Denies Pain with swallowing: Denies Swallowing/choking concerns: Denies Appetite: Good Energy Level: Low  Post radiation skin Changes: Denies    Additional comments if applicable: BP (!) 155/75 (BP Location: Left Arm, Patient Position: Sitting)   Pulse 75   Temp (!) 96.9 F (36.1 C) (Temporal)   Resp 18   Ht 5' 6 (1.676 m)   Wt 166 lb 2 oz (75.4 kg)   SpO2 97%   BMI 26.81 kg/m

## 2023-09-28 ENCOUNTER — Ambulatory Visit: Payer: Self-pay | Admitting: Internal Medicine

## 2023-09-29 NOTE — Progress Notes (Signed)
 Radiation Oncology         [phone] ________________________________  Name: Emily Abbott MRN: [id number]  Date: 09/30/2023  DOB: 26-Oct-1940  Follow-Up Visit Note  CC: Teresa Channel, MD  Teresa Channel, MD  No diagnosis found.   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 She was last seen in office on 05/30/23 for a follow up visit.   In the interval since she was last seen, she presented for a CT chest on 08/29/23 showing: new likely reactive prominent pretracheal and right hilar lymph nodes (consisting of pretracheal nodes measuring up to 1.8 x 1.3 cm, previously measuring 1.4 x 0.9 cm); an interval decrease in size of the previously hypermetabolic right upper lobe nodule, measuring 1.4 x 0.8 cm, with extensive new heterogeneous ground-glass and consolidative airspace opacity in a bandlike distribution within the surrounding right upper lobe present; and stability of the other additional nodules, including stability of the anterior inferior right upper lobe nodule measuring 1.1 x 0.9 cm, and of the the lateral segment right middle lobe nodule measuring 0.7 cm.   No other significant oncologic interval history since the patient was last seen for follow-up.   ***  Allergies:  is allergic to metformin and related, adhesive [tape], atorvastatin , and sulfonamide derivatives.  Meds: Current Outpatient Medications  Medication Sig  Dispense Refill   albuterol  (PROAIR  HFA) 108 (90 Base) MCG/ACT inhaler Inhale 1-2 puffs into the lungs every 6 (six) hours as needed for wheezing or shortness of breath. 18 g 1   ALPRAZolam  (XANAX ) 0.5 MG tablet Take 0.5 mg by mouth 2 (two) times daily as needed for anxiety or sleep.      apixaban  (ELIQUIS ) 5 MG TABS tablet Take 1 tablet (5 mg total) by mouth 2 (two) times daily. 180 tablet 2   calcium  carbonate (TUMS EX) 750 MG chewable tablet Chew 2 tablets by mouth as needed for heartburn.     cholecalciferol (VITAMIN D3) 25 MCG (1000 UNIT) tablet Take 1,000 Units by mouth as needed (when she remember).     diphenhydrAMINE  (BENADRYL ) 25 MG tablet Take 12.5 mg by mouth daily as needed for itching.     ELIQUIS  2.5 MG TABS tablet Take 2.5 mg by mouth 2 (two) times daily.     Empagliflozin-linaGLIPtin  (GLYXAMBI) 25-5 MG TABS Take 1 tablet by mouth daily.     ezetimibe  (ZETIA ) 10 MG tablet Take 10 mg by mouth at bedtime.     fluticasone  (FLONASE) 50 MCG/ACT nasal spray Place into both nostrils.     furosemide  (LASIX ) 20 MG tablet Take 1 tablet (20 mg total) by mouth daily. 90 tablet 2   HYDROcodone -acetaminophen  (NORCO/VICODIN) 5-325 MG tablet Take 0.5-1 tablets by mouth at bedtime.     Insulin  Glargine (BASAGLAR  KWIKPEN) 100 UNIT/ML Inject 18 Units into the skin daily.     ipratropium (ATROVENT) 0.06 % nasal spray Place into both nostrils.     ipratropium-albuterol  (DUONEB) 0.5-2.5 (3) MG/3ML SOLN Take 3 mLs by nebulization every 6 (six) hours  as needed. 360 mL 2   magnesium gluconate (MAGONATE) 500 MG tablet Take 500 mg by mouth daily as needed (for leg cramps).     metoprolol  succinate (TOPROL  XL) 25 MG 24 hr tablet Take 1 tablet (25 mg total) by mouth daily. 90 tablet 2   nystatin cream (MYCOSTATIN) 1 APPLICATION EXTERNALLY TWICE A DAY AS NEEDED FOR YEAST     omeprazole (PRILOSEC) 40 MG capsule Take 40 mg by mouth daily.       OXYGEN  Inhale 1.5 L into the lungs at bedtime.      PARoxetine   (PAXIL ) 30 MG tablet Take 30 mg by mouth at bedtime.     tiZANidine  (ZANAFLEX ) 2 MG tablet Take 2 mg by mouth at bedtime as needed for muscle spasms.      No current facility-administered medications for this encounter.    Physical Findings: The patient is in no acute distress. Patient is alert and oriented.  vitals were not taken for this visit. .  No significant changes. Lungs are clear to auscultation bilaterally. Heart has regular rate and rhythm. No palpable cervical, supraclavicular, or axillary adenopathy. Abdomen soft, non-tender, normal bowel sounds.   Lab Findings: Lab Results  Component Value Date   WBC 10.3 04/21/2023   HGB 12.0 04/21/2023   HCT 39.4 04/21/2023   MCV 76.8 (L) 04/21/2023   PLT 287 04/21/2023    Radiographic Findings: CUP PACEART REMOTE DEVICE CHECK Result Date: 09/27/2023 BIV PPM scheduled remote reviewed. Normal device function.  Presenting rhythm: BVP Next remote 91 days. AB, CVRS   Impression: Stage IA3 (cT1c, cN0, cM0) Poorly differentiated squamous cell carcinoma in the right upper lung  The patient is recovering from the effects of radiation.  ***  Plan:  ***   *** minutes of total time was spent for this patient encounter, including preparation, face-to-face counseling with the patient and coordination of care, physical exam, and documentation of the encounter. ____________________________________  Lynwood CHARM Nasuti, PhD, MD  This document serves as a record of services personally performed by Lynwood Nasuti, MD. It was created on his behalf by Dorthy Fuse, a trained medical scribe. The creation of this record is based on the scribe's personal observations and the provider's statements to them. This document has been checked and approved by the attending provider.

## 2023-09-30 ENCOUNTER — Encounter: Payer: Self-pay | Admitting: Radiation Oncology

## 2023-09-30 ENCOUNTER — Ambulatory Visit
Admission: RE | Admit: 2023-09-30 | Discharge: 2023-09-30 | Disposition: A | Discharge status: Home / Self Care | Source: Ambulatory Visit | Attending: Radiation Oncology | Admitting: Radiation Oncology

## 2023-09-30 VITALS — BP 155/75 | HR 75 | Temp 96.9°F | Resp 18 | Ht 66.0 in | Wt 166.1 lb

## 2023-09-30 DIAGNOSIS — Z923 Personal history of irradiation: Secondary | ICD-10-CM | POA: Diagnosis not present

## 2023-09-30 DIAGNOSIS — Z79899 Other long term (current) drug therapy: Secondary | ICD-10-CM | POA: Diagnosis not present

## 2023-09-30 DIAGNOSIS — C3411 Malignant neoplasm of upper lobe, right bronchus or lung: Secondary | ICD-10-CM | POA: Insufficient documentation

## 2023-09-30 DIAGNOSIS — Z87891 Personal history of nicotine dependence: Secondary | ICD-10-CM | POA: Diagnosis not present

## 2023-09-30 DIAGNOSIS — Z7901 Long term (current) use of anticoagulants: Secondary | ICD-10-CM | POA: Insufficient documentation

## 2023-09-30 DIAGNOSIS — Z794 Long term (current) use of insulin: Secondary | ICD-10-CM | POA: Insufficient documentation

## 2023-09-30 DIAGNOSIS — Z7984 Long term (current) use of oral hypoglycemic drugs: Secondary | ICD-10-CM | POA: Diagnosis not present

## 2023-10-01 ENCOUNTER — Other Ambulatory Visit: Payer: Self-pay | Admitting: Radiology

## 2023-10-01 DIAGNOSIS — C3411 Malignant neoplasm of upper lobe, right bronchus or lung: Secondary | ICD-10-CM

## 2023-10-02 ENCOUNTER — Other Ambulatory Visit: Payer: Self-pay

## 2023-10-02 MED ORDER — FUROSEMIDE 20 MG PO TABS: 20.0000 mg | ORAL_TABLET | Freq: Every day | ORAL | 2 refills | Status: AC

## 2023-10-02 NOTE — Addendum Note (Signed)
 Encounter addended by: Claudene Eudora GAILS, LPN on: 1/86/7974 9:46 AM  Actions taken: Charge Capture section accepted

## 2023-10-07 ENCOUNTER — Other Ambulatory Visit: Payer: Self-pay | Admitting: Internal Medicine

## 2023-11-05 ENCOUNTER — Telehealth: Payer: Self-pay | Admitting: *Deleted

## 2023-11-05 NOTE — Telephone Encounter (Signed)
 Called patient to inform of CT for 01-01-24- arrival time- 12:45 pm @ [facility], no restrictions to scan, patient to receive results from Dr. Shannon on 01-06-24 @ 10 am, lvm for a return call

## 2023-11-15 NOTE — Progress Notes (Signed)
 Remote PPM Transmission

## 2023-12-12 DIAGNOSIS — I480 Paroxysmal atrial fibrillation: Secondary | ICD-10-CM | POA: Diagnosis not present

## 2023-12-12 DIAGNOSIS — I5022 Chronic systolic (congestive) heart failure: Secondary | ICD-10-CM | POA: Diagnosis not present

## 2023-12-12 DIAGNOSIS — N1832 Chronic kidney disease, stage 3b: Secondary | ICD-10-CM | POA: Diagnosis not present

## 2023-12-12 DIAGNOSIS — F419 Anxiety disorder, unspecified: Secondary | ICD-10-CM | POA: Diagnosis not present

## 2023-12-12 DIAGNOSIS — R2681 Unsteadiness on feet: Secondary | ICD-10-CM | POA: Diagnosis not present

## 2023-12-12 DIAGNOSIS — M170 Bilateral primary osteoarthritis of knee: Secondary | ICD-10-CM | POA: Diagnosis not present

## 2023-12-12 DIAGNOSIS — E1121 Type 2 diabetes mellitus with diabetic nephropathy: Secondary | ICD-10-CM | POA: Diagnosis not present

## 2023-12-12 DIAGNOSIS — R296 Repeated falls: Secondary | ICD-10-CM | POA: Diagnosis not present

## 2023-12-12 DIAGNOSIS — R42 Dizziness and giddiness: Secondary | ICD-10-CM | POA: Diagnosis not present

## 2023-12-12 DIAGNOSIS — F22 Delusional disorders: Secondary | ICD-10-CM | POA: Diagnosis not present

## 2023-12-12 DIAGNOSIS — Z950 Presence of cardiac pacemaker: Secondary | ICD-10-CM | POA: Diagnosis not present

## 2023-12-12 DIAGNOSIS — I129 Hypertensive chronic kidney disease with stage 1 through stage 4 chronic kidney disease, or unspecified chronic kidney disease: Secondary | ICD-10-CM | POA: Diagnosis not present

## 2023-12-27 ENCOUNTER — Ambulatory Visit (INDEPENDENT_AMBULATORY_CARE_PROVIDER_SITE_OTHER): Payer: Medicare HMO

## 2023-12-27 DIAGNOSIS — I428 Other cardiomyopathies: Secondary | ICD-10-CM

## 2023-12-29 LAB — CUP PACEART REMOTE DEVICE CHECK
Battery Remaining Longevity: 24 mo
Battery Remaining Percentage: 25 %
Battery Voltage: 2.92 V
Date Time Interrogation Session: 20251107030016
Implantable Lead Connection Status: 753985
Implantable Lead Connection Status: 753985
Implantable Lead Connection Status: 753985
Implantable Lead Implant Date: 20110127
Implantable Lead Implant Date: 20110127
Implantable Lead Implant Date: 20110127
Implantable Lead Location: 753858
Implantable Lead Location: 753859
Implantable Lead Location: 753860
Implantable Lead Model: 7122
Implantable Pulse Generator Implant Date: 20190418
Lead Channel Impedance Value: 600 Ohm
Lead Channel Impedance Value: 940 Ohm
Lead Channel Pacing Threshold Amplitude: 0.625 V
Lead Channel Pacing Threshold Amplitude: 0.75 V
Lead Channel Pacing Threshold Pulse Width: 0.5 ms
Lead Channel Pacing Threshold Pulse Width: 0.6 ms
Lead Channel Sensing Intrinsic Amplitude: 12 mV
Lead Channel Setting Pacing Amplitude: 2 V
Lead Channel Setting Pacing Amplitude: 2.5 V
Lead Channel Setting Pacing Pulse Width: 0.5 ms
Lead Channel Setting Pacing Pulse Width: 0.6 ms
Lead Channel Setting Sensing Sensitivity: 2 mV
Pulse Gen Model: 3222
Pulse Gen Serial Number: 9393517

## 2023-12-30 ENCOUNTER — Ambulatory Visit: Payer: Self-pay | Admitting: Internal Medicine

## 2023-12-31 ENCOUNTER — Telehealth: Payer: Self-pay

## 2023-12-31 ENCOUNTER — Other Ambulatory Visit: Payer: Self-pay | Admitting: Radiation Oncology

## 2023-12-31 DIAGNOSIS — R42 Dizziness and giddiness: Secondary | ICD-10-CM | POA: Diagnosis not present

## 2023-12-31 DIAGNOSIS — C3411 Malignant neoplasm of upper lobe, right bronchus or lung: Secondary | ICD-10-CM

## 2023-12-31 DIAGNOSIS — J069 Acute upper respiratory infection, unspecified: Secondary | ICD-10-CM | POA: Diagnosis not present

## 2023-12-31 DIAGNOSIS — I129 Hypertensive chronic kidney disease with stage 1 through stage 4 chronic kidney disease, or unspecified chronic kidney disease: Secondary | ICD-10-CM | POA: Diagnosis not present

## 2023-12-31 NOTE — Telephone Encounter (Signed)
 Called and made patients daughter Rae) aware that Head CT scan was ordered due to patient having pacemaker. Daughter will be notified once appointment scheduled.

## 2023-12-31 NOTE — Telephone Encounter (Signed)
 Patients daughter  Rae Silvan) called in requesting MRI to brain on 01/01/24 when she has CT Chest. Per daughter patient has issues with vision, memory loss and confusion. Per daughter she thinks she may have another stroke.  Patient  was seen by PCP today due to having an upper respiratory infection. PCP recommending brain scan.

## 2023-12-31 NOTE — Progress Notes (Signed)
 Remote PPM Transmission

## 2024-01-01 ENCOUNTER — Telehealth: Payer: Self-pay | Admitting: *Deleted

## 2024-01-01 ENCOUNTER — Ambulatory Visit (HOSPITAL_COMMUNITY)
Admission: RE | Admit: 2024-01-01 | Discharge: 2024-01-01 | Disposition: A | Discharge status: Home / Self Care | Source: Ambulatory Visit | Attending: Radiology | Admitting: Radiology

## 2024-01-01 ENCOUNTER — Ambulatory Visit (HOSPITAL_COMMUNITY)
Admission: RE | Admit: 2024-01-01 | Discharge: 2024-01-01 | Disposition: A | Discharge status: Home / Self Care | Source: Ambulatory Visit | Attending: Radiation Oncology | Admitting: Radiation Oncology

## 2024-01-01 DIAGNOSIS — C3411 Malignant neoplasm of upper lobe, right bronchus or lung: Secondary | ICD-10-CM | POA: Insufficient documentation

## 2024-01-01 DIAGNOSIS — C3490 Malignant neoplasm of unspecified part of unspecified bronchus or lung: Secondary | ICD-10-CM | POA: Diagnosis not present

## 2024-01-01 DIAGNOSIS — I6782 Cerebral ischemia: Secondary | ICD-10-CM | POA: Diagnosis not present

## 2024-01-01 DIAGNOSIS — I700 Atherosclerosis of aorta: Secondary | ICD-10-CM | POA: Diagnosis not present

## 2024-01-01 LAB — POCT I-STAT CREATININE: Creatinine, Ser: 1.4 mg/dL — ABNORMAL HIGH (ref 0.44–1.00)

## 2024-01-01 MED ORDER — IOHEXOL 300 MG/ML  SOLN: 75.0000 mL | Freq: Once | INTRAMUSCULAR | Status: DC | PRN

## 2024-01-01 MED ORDER — IOHEXOL 300 MG/ML  SOLN
75.0000 mL | Freq: Once | INTRAMUSCULAR | Status: AC | PRN
  Administered 2024-01-01: 75 mL via INTRAVENOUS

## 2024-01-01 NOTE — Telephone Encounter (Signed)
 Called patient 's daughter - Adrien Silvan to inform of CT for 01-01-24, no restrictions to scan,arrival time- 5:15 pm @ [facility], lvm for a return call

## 2024-01-01 NOTE — Telephone Encounter (Signed)
 Called patient's daughter- Emily Abbott to inform of CT for 01-01-24- arrival time- 5:15 pm @ [facility], no restrictions to scan, lvm for a return call

## 2024-01-04 NOTE — Progress Notes (Signed)
 Radiation Oncology         [phone] ________________________________  Name: Emily Abbott MRN: [id number]  Date: 01/06/2024  DOB: Feb 12, 1941  Follow-Up Visit Note  CC: Teresa Channel, MD  Teresa Channel, MD  No diagnosis found.  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 She was last seen here for follow-up on 09/30/23. To review from her last visit, her most recent chest CT at the time (performed on 08/29/23) demonstrated stable findings other than several newly prominent pretracheal and right hilar lymph nodes. Interval 3 month repeat imaging was subsequently recommended which we will review together today.   As scheduled, she accordingly presented follow-up chest CT on 01/01/24 which demonstrated: an interval decrease in size of a right paratracheal lymph node, favoring a reactive etiology; new and increased areas of peribronchovascular ground glass nodularity, suspicious for an ongoing mild infection; and evolving radiation changes in the RUL without evidence of locally recurrent disease. Interval stability of the various nodules throughout both lungs was also demonstrated.   Other pertinent imaging performed in the interval includes a CT of the head on 01/01/24 (for routine metastatic disease surveillance) which demonstrated no acute intracranial findings overall.   ***                             Allergies:  is allergic  to metformin and related, adhesive [tape], atorvastatin , and sulfonamide derivatives.  Meds: Current Outpatient Medications  Medication Sig Dispense Refill   albuterol  (PROAIR  HFA) 108 (90 Base) MCG/ACT inhaler Inhale 1-2 puffs into the lungs every 6 (six) hours as needed for wheezing or shortness of breath. 18 g 1   ALPRAZolam  (XANAX ) 0.5 MG tablet Take 0.5 mg by mouth 2 (two) times daily as needed for anxiety or sleep.      apixaban  (ELIQUIS ) 5 MG TABS tablet Take 1 tablet (5 mg total) by mouth 2 (two) times daily. 180 tablet 2   calcium  carbonate (TUMS EX) 750 MG chewable tablet Chew 2 tablets by mouth as needed for heartburn.     cholecalciferol (VITAMIN D3) 25 MCG (1000 UNIT) tablet Take 1,000 Units by mouth as needed (when she remember).     diphenhydrAMINE  (BENADRYL ) 25 MG tablet Take 12.5 mg by mouth daily as needed for itching.     ELIQUIS  2.5 MG TABS tablet Take 2.5 mg by mouth 2 (two) times daily.     Empagliflozin-linaGLIPtin  (GLYXAMBI) 25-5 MG TABS Take 1 tablet by mouth daily.     ezetimibe  (ZETIA ) 10 MG tablet Take 10 mg by mouth at bedtime.     fluticasone  (FLONASE) 50 MCG/ACT nasal spray Place into both nostrils.     furosemide  (LASIX ) 20 MG tablet Take 1 tablet (20 mg total) by mouth daily. 90 tablet 2   HYDROcodone -acetaminophen  (NORCO/VICODIN) 5-325 MG tablet Take 0.5-1 tablets by mouth at bedtime.     Insulin  Glargine (BASAGLAR  KWIKPEN) 100 UNIT/ML Inject 18 Units into the skin daily.  ipratropium (ATROVENT) 0.06 % nasal spray Place into both nostrils.     ipratropium-albuterol  (DUONEB) 0.5-2.5 (3) MG/3ML SOLN Take 3 mLs by nebulization every 6 (six) hours as needed. 360 mL 2   magnesium gluconate (MAGONATE) 500 MG tablet Take 500 mg by mouth daily as needed (for leg cramps).     metoprolol  succinate (TOPROL  XL) 25 MG 24 hr tablet Take 1 tablet (25 mg total) by mouth daily. 90 tablet 2   nystatin cream (MYCOSTATIN) 1 APPLICATION EXTERNALLY TWICE A DAY AS NEEDED FOR YEAST      omeprazole (PRILOSEC) 40 MG capsule Take 40 mg by mouth daily.       OXYGEN  Inhale 1.5 L into the lungs at bedtime.      PARoxetine  (PAXIL ) 30 MG tablet Take 30 mg by mouth at bedtime.     tiZANidine  (ZANAFLEX ) 2 MG tablet Take 2 mg by mouth at bedtime as needed for muscle spasms.      No current facility-administered medications for this encounter.    Physical Findings: The patient is in no acute distress. Patient is alert and oriented.  vitals were not taken for this visit. .  No significant changes. Lungs are clear to auscultation bilaterally. Heart has regular rate and rhythm. No palpable cervical, supraclavicular, or axillary adenopathy. Abdomen soft, non-tender, normal bowel sounds.   Lab Findings: Lab Results  Component Value Date   WBC 10.3 04/21/2023   HGB 12.0 04/21/2023   HCT 39.4 04/21/2023   MCV 76.8 (L) 04/21/2023   PLT 287 04/21/2023    Radiographic Findings: CT HEAD W & WO CONTRAST ( ) Result Date: 01/03/2024 EXAM: CT HEAD WITH AND WITHOUT INTRAVENOUS CONTRAST 01/01/2024 02:27:08 PM TECHNIQUE: CT of the head was performed with and without the administration of 75 mL of iohexol  (OMNIPAQUE ) 300 MG/ML solution. Automated exposure control, iterative reconstruction, and/or weight based adjustment of the mA/kV was utilized to reduce the radiation dose to as low as reasonably achievable. COMPARISON: CTA head and neck 04/22/2023. CLINICAL HISTORY: Metastatic disease evaluation; Non-small cell lung cancer (NSCLC), metastatic, assess treatment response. FINDINGS: BRAIN AND VENTRICLES: No acute intracranial hemorrhage. No mass effect or midline shift. No extra-axial fluid collection. No evidence of acute infarct. No hydrocephalus. Similar appearance of chronic microvascular ischemic changes. Mild age related parenchymal volume loss. No CT evidence of intracranial lesion. There is no CT evidence of abnormal intracranial enhancement. ORBITS: Bilateral lens replacement. No acute  abnormality. SINUSES AND MASTOIDS: No acute abnormality. SOFT TISSUES AND SKULL: No acute skull fracture. No acute soft tissue abnormality. IMPRESSION: 1. No acute intracranial abnormality. 2. No abnormal enhancement. 3. Chronic microvascular ischemic changes and mild age-related parenchymal volume loss. Electronically signed by: Donnice Mania MD 01/03/2024 09:30 AM EST RP Workstation: [id number]   CT CHEST WO CONTRAST Result Date: 01/03/2024 EXAM: CT CHEST WITHOUT CONTRAST 01/01/2024 02:27:08 PM TECHNIQUE: CT of the chest was performed without the administration of intravenous contrast. Multiplanar reformatted images are provided for review. Automated exposure control, iterative reconstruction, and/or weight based adjustment of the mA/kV was utilized to reduce the radiation dose to as low as reasonably achievable. COMPARISON: CT Chest without contrast 08/29/2023. CLINICAL HISTORY: Non-small cell lung cancer (NSCLC), monitor. * Tracking Code: BO * FINDINGS: MEDIASTINUM: Pacer / ICD. Multivessel coronary artery calcification. Aortic atherosclerosis. The central airways are clear. Pericardium is unremarkable. LYMPH NODES: Right paratracheal node of 1.0 cm on image 58 / 2 versus 1.3 cm on the prior. Hilar regions poorly evaluated without IV contrast. No axillary lymphadenopathy.  LUNGS AND PLEURA: The right upper lobe areas of airspace and ground glass opacity are slightly more confluent and decreased in size today. Anterior inferior right upper lobe pulmonary nodule measures 10 x 8 mm on image 78 / 7 and is similar to 11 x 9 mm on the prior. Posterior right middle lobe nodule measures 5 mm image 102 / 7 and is similar to the prior. more medial right middle lobe 2 to 3 mm nodule is unchanged. Millimetric pulmonary nodules in both lungs are similar. Example in the left lower lobe at 5 mm and image 98 / 7. New airspace and ground glass in the superior segment right lower lobe on image 62 / 7 likely radiation induced.  There are however, new and increased areas of peribronchovascular ground glass nodularity. Example right lower lobe image 94 / 7,m left upper lobe posteriorly on image 79 / 7. No pleural effusion or pneumothorax. SOFT TISSUES/BONES: Median sternotomy. Intact sternotomy wires. Osteopenia. No acute fracture. UPPER ABDOMEN: Mild right adrenal nodularity is unchanged, low density and favored to represent an adenoma. Normal left adrenal gland. Limited images of the upper abdomen demonstrates no other acute abnormality. IMPRESSION: 1. Evolving radiation change within the right upper lobe, without locally recurrent disease. 2. Decrease in size of a right paratracheal node, favored to be reactive 3. New and increased areas of peribronchovascular ground glass nodularity, suspicious for ongoing mild infection. 4. Primarily similar pulmonary nodules, most likely benign but warranting follow up attention. 5. Incidental findings, including: Aortic atherosclerosis ([id number]). Coronary artery atherosclerosis. Electronically signed by: Rockey Kilts MD 01/03/2024 09:13 AM EST RP Workstation: [id number]   CUP PACEART REMOTE DEVICE CHECK Result Date: 12/29/2023 PPM Scheduled remote reviewed. Normal device function.  Presenting rhythm: BIV paced, VVIR.  HF diagnostics have been abnormal in this monitoring period.  Occasional out of range measurements noted on RV autocapture trend. Next remote transmission per protocol. 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  ***  Plan:  ***   *** minutes of total time was spent for this patient encounter, including preparation, face-to-face counseling with the patient and coordination of care, physical exam, and documentation of the encounter. ____________________________________  Lynwood CHARM Nasuti, PhD, MD  This document serves as a record of  services personally performed by Lynwood Nasuti, MD. It was created on his behalf by Dorthy Fuse, a trained medical scribe. The creation of this record is based on the scribe's personal observations and the provider's statements to them. This document has been checked and approved by the attending provider.

## 2024-01-06 ENCOUNTER — Ambulatory Visit
Admission: RE | Admit: 2024-01-06 | Discharge: 2024-01-06 | Disposition: A | Discharge status: Home / Self Care | Payer: Self-pay | Source: Ambulatory Visit | Attending: Radiation Oncology | Admitting: Radiation Oncology

## 2024-01-06 ENCOUNTER — Encounter: Payer: Self-pay | Admitting: Radiation Oncology

## 2024-01-06 VITALS — BP 108/74 | HR 76 | Temp 97.1°F | Resp 18 | Ht 66.0 in | Wt 161.4 lb

## 2024-01-06 DIAGNOSIS — C3411 Malignant neoplasm of upper lobe, right bronchus or lung: Secondary | ICD-10-CM | POA: Diagnosis not present

## 2024-01-06 NOTE — Progress Notes (Signed)
 Emily Abbott is here today for follow up post radiation to the lung.  Lung Side: Right, patient completed treatment on 05/02/23.   Does the patient complain of any of the following: Pain: Reports intermittent  sharp chest pains.  Shortness of breath w/wo exertion:  yes, mostly on exertion. Continues to wear oxygen  at night.  Cough: Yes, productive  Hemoptysis: No Pain with swallowing: No Swallowing/choking concerns: No Appetite: Good  Energy Level: Low Post radiation skin Changes: No     Additional comments if applicable:  Patient currently on Doxycycline  and Prednisone  taper for upper respiratory infection.   BP 108/74 (BP Location: Left Arm, Patient Position: Sitting)   Pulse 76   Temp (!) 97.1 F (36.2 C) (Temporal)   Resp 18   Ht 5' 6 (1.676 m)   Wt 161 lb 6 oz (73.2 kg)   SpO2 97%   BMI 26.05 kg/m

## 2024-01-07 ENCOUNTER — Ambulatory Visit: Payer: Self-pay | Admitting: Radiology

## 2024-01-13 ENCOUNTER — Telehealth: Payer: Self-pay | Admitting: *Deleted

## 2024-01-13 NOTE — Telephone Encounter (Signed)
 CALLED PATIENT'S DAUGHTER DEBRA WRIGHT AND INFORMED OF CT FOR 07-03-24- ARRIVAL TIME- 12:15 PM @ [facility], NO RESTRICTIONS TO SCAN, PATIENT TO RECEIVE RESULTS FROM DR.KINARD ON 07-16-24 @ 11:45 AM, SPOKE WITH PATEINT'S DAUGHTER DEBRA WRIGHT AND SHE IS AWARE OF THESE APPTS. AND THE INSTRUCTIONS

## 2024-02-21 ENCOUNTER — Telehealth: Payer: Self-pay | Admitting: Internal Medicine

## 2024-02-21 NOTE — Telephone Encounter (Signed)
 Left voice message to call back 1/2

## 2024-02-21 NOTE — Telephone Encounter (Signed)
 Per patients MyChart message request: Moms been having trouble breathing at night. COPD. Blood Test results with Dr. Teresa showed more CHF. Feeling weak    Patients daughter/son requested an appointment with Charlies Arthur, PA - no appointments available with any [facility] EP APP.

## 2024-02-25 NOTE — Telephone Encounter (Signed)
 Attempted phone call to pt and left voicemail message to contact office at [phone].  Advised will also send MyChart message.

## 2024-02-28 NOTE — Telephone Encounter (Signed)
 Left message for patient to call back

## 2024-03-03 NOTE — Telephone Encounter (Signed)
 Left message to call back   (Informed in message this was the 3rd and final message)

## 2024-03-03 NOTE — Telephone Encounter (Signed)
 error

## 2024-03-27 ENCOUNTER — Ambulatory Visit: Payer: Medicare HMO

## 2024-03-27 LAB — CUP PACEART REMOTE DEVICE CHECK
Battery Remaining Longevity: 22 mo
Battery Remaining Percentage: 22 %
Battery Voltage: 2.92 V
Date Time Interrogation Session: 20260206065143
Implantable Lead Connection Status: 753985
Implantable Lead Connection Status: 753985
Implantable Lead Connection Status: 753985
Implantable Lead Implant Date: 20110127
Implantable Lead Implant Date: 20110127
Implantable Lead Implant Date: 20110127
Implantable Lead Location: 753858
Implantable Lead Location: 753859
Implantable Lead Location: 753860
Implantable Lead Model: 7122
Implantable Pulse Generator Implant Date: 20190418
Lead Channel Impedance Value: 610 Ohm
Lead Channel Impedance Value: 910 Ohm
Lead Channel Pacing Threshold Amplitude: 0.625 V
Lead Channel Pacing Threshold Amplitude: 0.75 V
Lead Channel Pacing Threshold Pulse Width: 0.5 ms
Lead Channel Pacing Threshold Pulse Width: 0.6 ms
Lead Channel Sensing Intrinsic Amplitude: 12 mV
Lead Channel Setting Pacing Amplitude: 2 V
Lead Channel Setting Pacing Amplitude: 2.5 V
Lead Channel Setting Pacing Pulse Width: 0.5 ms
Lead Channel Setting Pacing Pulse Width: 0.6 ms
Lead Channel Setting Sensing Sensitivity: 2 mV
Pulse Gen Model: 3222
Pulse Gen Serial Number: 9393517

## 2024-04-17 ENCOUNTER — Ambulatory Visit: Admitting: Physician Assistant

## 2024-05-26 ENCOUNTER — Ambulatory Visit: Admitting: Pulmonary Disease

## 2024-06-26 ENCOUNTER — Ambulatory Visit

## 2024-07-03 ENCOUNTER — Ambulatory Visit (HOSPITAL_COMMUNITY)

## 2024-07-16 ENCOUNTER — Ambulatory Visit: Admitting: Radiation Oncology

## 2024-09-25 ENCOUNTER — Ambulatory Visit

## 2024-12-25 ENCOUNTER — Ambulatory Visit

## 2025-03-26 ENCOUNTER — Ambulatory Visit

## 2025-06-25 ENCOUNTER — Ambulatory Visit
